# Patient Record
Sex: Female | Born: 1980 | Race: Black or African American | Hispanic: No | Marital: Married | Smoking: Never smoker
Health system: Southern US, Community
[De-identification: ages and names within clinical notes are randomized; demographics above are authoritative.]

## PROBLEM LIST (undated history)

## (undated) ENCOUNTER — Inpatient Hospital Stay (HOSPITAL_COMMUNITY): Payer: Self-pay

## (undated) DIAGNOSIS — Z800 Family history of malignant neoplasm of digestive organs: Secondary | ICD-10-CM

## (undated) DIAGNOSIS — O4400 Placenta previa specified as without hemorrhage, unspecified trimester: Secondary | ICD-10-CM

## (undated) DIAGNOSIS — C801 Malignant (primary) neoplasm, unspecified: Secondary | ICD-10-CM

## (undated) DIAGNOSIS — Z973 Presence of spectacles and contact lenses: Secondary | ICD-10-CM

## (undated) DIAGNOSIS — Z8042 Family history of malignant neoplasm of prostate: Secondary | ICD-10-CM

## (undated) DIAGNOSIS — Z923 Personal history of irradiation: Secondary | ICD-10-CM

## (undated) DIAGNOSIS — Z789 Other specified health status: Secondary | ICD-10-CM

## (undated) DIAGNOSIS — Z803 Family history of malignant neoplasm of breast: Secondary | ICD-10-CM

## (undated) DIAGNOSIS — Z801 Family history of malignant neoplasm of trachea, bronchus and lung: Secondary | ICD-10-CM

## (undated) DIAGNOSIS — Z9221 Personal history of antineoplastic chemotherapy: Secondary | ICD-10-CM

## (undated) HISTORY — PX: WISDOM TOOTH EXTRACTION: SHX21

## (undated) HISTORY — DX: Family history of malignant neoplasm of digestive organs: Z80.0

## (undated) HISTORY — PX: NO PAST SURGERIES: SHX2092

## (undated) HISTORY — DX: Family history of malignant neoplasm of trachea, bronchus and lung: Z80.1

## (undated) HISTORY — DX: Family history of malignant neoplasm of breast: Z80.3

## (undated) HISTORY — DX: Family history of malignant neoplasm of prostate: Z80.42

---

## 2001-01-07 ENCOUNTER — Other Ambulatory Visit: Admission: RE | Admit: 2001-01-07 | Discharge: 2001-01-07 | Payer: Self-pay | Admitting: Family Medicine

## 2001-10-07 ENCOUNTER — Other Ambulatory Visit: Admission: RE | Admit: 2001-10-07 | Discharge: 2001-10-07 | Payer: Self-pay | Admitting: Internal Medicine

## 2003-02-02 ENCOUNTER — Other Ambulatory Visit: Admission: RE | Admit: 2003-02-02 | Discharge: 2003-02-02 | Payer: Self-pay | Admitting: Family Medicine

## 2012-06-20 ENCOUNTER — Encounter: Payer: Self-pay | Admitting: Women's Health

## 2012-06-20 ENCOUNTER — Other Ambulatory Visit (HOSPITAL_COMMUNITY)
Admission: RE | Admit: 2012-06-20 | Discharge: 2012-06-20 | Disposition: A | Discharge status: Home / Self Care | Payer: BC Managed Care – PPO | Source: Ambulatory Visit | Attending: Women's Health | Admitting: Women's Health

## 2012-06-20 ENCOUNTER — Ambulatory Visit (INDEPENDENT_AMBULATORY_CARE_PROVIDER_SITE_OTHER): Payer: BC Managed Care – PPO | Admitting: Women's Health

## 2012-06-20 VITALS — BP 120/68 | Ht 64.25 in | Wt 126.0 lb

## 2012-06-20 DIAGNOSIS — Z01419 Encounter for gynecological examination (general) (routine) without abnormal findings: Secondary | ICD-10-CM

## 2012-06-20 DIAGNOSIS — Z1151 Encounter for screening for human papillomavirus (HPV): Secondary | ICD-10-CM | POA: Insufficient documentation

## 2012-06-20 DIAGNOSIS — N898 Other specified noninflammatory disorders of vagina: Secondary | ICD-10-CM

## 2012-06-20 DIAGNOSIS — IMO0001 Reserved for inherently not codable concepts without codable children: Secondary | ICD-10-CM

## 2012-06-20 DIAGNOSIS — Z309 Encounter for contraceptive management, unspecified: Secondary | ICD-10-CM

## 2012-06-20 LAB — CBC WITH DIFFERENTIAL/PLATELET
Eosinophils Absolute: 0.1 10*3/uL (ref 0.0–0.7)
Eosinophils Relative: 2 % (ref 0–5)
Lymphs Abs: 3 10*3/uL (ref 0.7–4.0)
MCH: 29.5 pg (ref 26.0–34.0)
MCV: 87.8 fL (ref 78.0–100.0)
Platelets: 290 10*3/uL (ref 150–400)
RDW: 13.5 % (ref 11.5–15.5)

## 2012-06-20 LAB — WET PREP FOR TRICH, YEAST, CLUE: Clue Cells Wet Prep HPF POC: NONE SEEN

## 2012-06-20 MED ORDER — DROSPIRENONE-ETHINYL ESTRADIOL 3-0.02 MG PO TABS: 1.0000 | ORAL_TABLET | Freq: Every day | ORAL | Status: DC

## 2012-06-20 NOTE — Patient Instructions (Signed)
tdap vaccine Health Maintenance, Females A healthy lifestyle and preventative care can promote health and wellness.  Maintain regular health, dental, and eye exams.   Eat a healthy diet. Foods like vegetables, fruits, whole grains, low-fat dairy products, and lean protein foods contain the nutrients you need without too many calories. Decrease your intake of foods high in solid fats, added sugars, and salt. Get information about a proper diet from your caregiver, if necessary.   Regular physical exercise is one of the most important things you can do for your health. Most adults should get at least 150 minutes of moderate-intensity exercise (any activity that increases your heart rate and causes you to sweat) each week. In addition, most adults need muscle-strengthening exercises on 2 or more days a week.    Maintain a healthy weight. The body mass index (BMI) is a screening tool to identify possible weight problems. It provides an estimate of body fat based on height and weight. Your caregiver can help determine your BMI, and can help you achieve or maintain a healthy weight. For adults 20 years and older:   A BMI below 18.5 is considered underweight.   A BMI of 18.5 to 24.9 is normal.   A BMI of 25 to 29.9 is considered overweight.   A BMI of 30 and above is considered obese.   Maintain normal blood lipids and cholesterol by exercising and minimizing your intake of saturated fat. Eat a balanced diet with plenty of fruits and vegetables. Blood tests for lipids and cholesterol should begin at age 20 and be repeated every 5 years. If your lipid or cholesterol levels are high, you are over 50, or you are a high risk for heart disease, you may need your cholesterol levels checked more frequently.Ongoing high lipid and cholesterol levels should be treated with medicines if diet and exercise are not effective.   If you smoke, find out from your caregiver how to quit. If you do not use tobacco, do  not start.   If you are pregnant, do not drink alcohol. If you are breastfeeding, be very cautious about drinking alcohol. If you are not pregnant and choose to drink alcohol, do not exceed 1 drink per day. One drink is considered to be 12 ounces (355 mL) of beer, 5 ounces (148 mL) of wine, or 1.5 ounces (44 mL) of liquor.   Avoid use of street drugs. Do not share needles with anyone. Ask for help if you need support or instructions about stopping the use of drugs.   High blood pressure causes heart disease and increases the risk of stroke. Blood pressure should be checked at least every 1 to 2 years. Ongoing high blood pressure should be treated with medicines, if weight loss and exercise are not effective.   If you are 55 to 32 years old, ask your caregiver if you should take aspirin to prevent strokes.   Diabetes screening involves taking a blood sample to check your fasting blood sugar level. This should be done once every 3 years, after age 45, if you are within normal weight and without risk factors for diabetes. Testing should be considered at a younger age or be carried out more frequently if you are overweight and have at least 1 risk factor for diabetes.   Breast cancer screening is essential preventative care for women. You should practice "breast self-awareness." This means understanding the normal appearance and feel of your breasts and may include breast self-examination. Any changes detected, no   matter how small, should be reported to a caregiver. Women in their 20s and 30s should have a clinical breast exam (CBE) by a caregiver as part of a regular health exam every 1 to 3 years. After age 40, women should have a CBE every year. Starting at age 40, women should consider having a mammogram (breast X-ray) every year. Women who have a family history of breast cancer should talk to their caregiver about genetic screening. Women at a high risk of breast cancer should talk to their caregiver  about having an MRI and a mammogram every year.   The Pap test is a screening test for cervical cancer. Women should have a Pap test starting at age 21. Between ages 21 and 29, Pap tests should be repeated every 2 years. Beginning at age 30, you should have a Pap test every 3 years as long as the past 3 Pap tests have been normal. If you had a hysterectomy for a problem that was not cancer or a condition that could lead to cancer, then you no longer need Pap tests. If you are between ages 65 and 70, and you have had normal Pap tests going back 10 years, you no longer need Pap tests. If you have had past treatment for cervical cancer or a condition that could lead to cancer, you need Pap tests and screening for cancer for at least 20 years after your treatment. If Pap tests have been discontinued, risk factors (such as a new sexual partner) need to be reassessed to determine if screening should be resumed. Some women have medical problems that increase the chance of getting cervical cancer. In these cases, your caregiver may recommend more frequent screening and Pap tests.   The human papillomavirus (HPV) test is an additional test that may be used for cervical cancer screening. The HPV test looks for the virus that can cause the cell changes on the cervix. The cells collected during the Pap test can be tested for HPV. The HPV test could be used to screen women aged 30 years and older, and should be used in women of any age who have unclear Pap test results. After the age of 30, women should have HPV testing at the same frequency as a Pap test.   Colorectal cancer can be detected and often prevented. Most routine colorectal cancer screening begins at the age of 50 and continues through age 75. However, your caregiver may recommend screening at an earlier age if you have risk factors for colon cancer. On a yearly basis, your caregiver may provide home test kits to check for hidden blood in the stool. Use of a  small camera at the end of a tube, to directly examine the colon (sigmoidoscopy or colonoscopy), can detect the earliest forms of colorectal cancer. Talk to your caregiver about this at age 50, when routine screening begins. Direct examination of the colon should be repeated every 5 to 10 years through age 75, unless early forms of pre-cancerous polyps or small growths are found.   Hepatitis C blood testing is recommended for all people born from 1945 through 1965 and any individual with known risks for hepatitis C.   Practice safe sex. Use condoms and avoid high-risk sexual practices to reduce the spread of sexually transmitted infections (STIs). Sexually active women aged 25 and younger should be checked for Chlamydia, which is a common sexually transmitted infection. Older women with new or multiple partners should also be tested for Chlamydia. Testing   for other STIs is recommended if you are sexually active and at increased risk.   Osteoporosis is a disease in which the bones lose minerals and strength with aging. This can result in serious bone fractures. The risk of osteoporosis can be identified using a bone density scan. Women ages 65 and over and women at risk for fractures or osteoporosis should discuss screening with their caregivers. Ask your caregiver whether you should be taking a calcium supplement or vitamin D to reduce the rate of osteoporosis.   Menopause can be associated with physical symptoms and risks. Hormone replacement therapy is available to decrease symptoms and risks. You should talk to your caregiver about whether hormone replacement therapy is right for you.   Use sunscreen with a sun protection factor (SPF) of 30 or greater. Apply sunscreen liberally and repeatedly throughout the day. You should seek shade when your shadow is shorter than you. Protect yourself by wearing long sleeves, pants, a wide-brimmed hat, and sunglasses year round, whenever you are outdoors.   Notify  your caregiver of new moles or changes in moles, especially if there is a change in shape or color. Also notify your caregiver if a mole is larger than the size of a pencil eraser.   Stay current with your immunizations.  Document Released: 06/05/2011 Document Revised: 11/09/2011 Document Reviewed: 06/05/2011 [employer] Patient Information 2012 [employer]. 

## 2012-06-20 NOTE — Progress Notes (Signed)
Chelsea Wise [birth year] [id number]    History:    The patient presents for annual exam.  Monthly 4-5 day cycle on Yaz. History of normal Paps per patient, normal Pap 5/11. Desiring children in a year or so.   Past medical history, past surgical history, family history and social history were all reviewed and documented in the EPIC chart. Works for the [employer].   ROS:  A  ROS was performed and pertinent positives and negatives are included in the history.  Exam:  Filed Vitals:   06/20/12 1455  BP: 120/68    General appearance:  Normal Head/Neck:  Normal, without cervical or supraclavicular adenopathy. Thyroid:  Symmetrical, normal in size, without palpable masses or nodularity. Respiratory  Effort:  Normal  Auscultation:  Clear without wheezing or rhonchi Cardiovascular  Auscultation:  Regular rate, without rubs, murmurs or gallops  Edema/varicosities:  Not grossly evident Abdominal  Soft,nontender, without masses, guarding or rebound.  Liver/spleen:  No organomegaly noted  Hernia:  None appreciated  Skin  Inspection:  Grossly normal  Palpation:  Grossly normal Neurologic/psychiatric  Orientation:  Normal with appropriate conversation.  Mood/affect:  Normal  Genitourinary    Breasts: Examined lying and sitting.     Right: Without masses, retractions, discharge or axillary adenopathy.     Left: Without masses, retractions, discharge or axillary adenopathy.   Inguinal/mons:  Normal without inguinal adenopathy  External genitalia:  Normal  BUS/Urethra/Skene's glands:  Normal  Bladder:  Normal  Vagina:  Normal wet prep negative  Cervix:  Normal  Uterus:   normal in size, shape and contour.  Midline and mobile  Adnexa/parametria:     Rt: Without masses or tenderness.   Lt: Without masses or tenderness.  Anus and perineum: Normal  Digital rectal exam: Normal sphincter tone without palpated masses or tenderness  Assessment/Plan:  31 y.o. MBF G0 for annual  exam with complaint of vaginal irritation.  Normal GYN exam on Yaz  Plan: Yaz prescription, proper use, slight increased risk for blood clots and strokes reviewed and accepted. SBE's, exercise, calcium rich diet, MVI daily encouraged. CBC, rubella, UA Pap with HR HPV typing. New Pap screening recommendations reviewed. Reviewed normality of wet prep.    [facility], 5:37 PM 06/20/2012

## 2012-06-21 LAB — URINALYSIS W MICROSCOPIC + REFLEX CULTURE
Bacteria, UA: NONE SEEN
Casts: NONE SEEN
Hgb urine dipstick: NEGATIVE
Ketones, ur: NEGATIVE mg/dL
Nitrite: NEGATIVE
pH: 7 (ref 5.0–8.0)

## 2012-06-21 LAB — RUBELLA SCREEN: Rubella: 205.5 IU/mL — ABNORMAL HIGH

## 2013-03-20 ENCOUNTER — Encounter (HOSPITAL_COMMUNITY): Payer: Self-pay | Admitting: Emergency Medicine

## 2013-03-20 ENCOUNTER — Emergency Department (HOSPITAL_COMMUNITY): Payer: Self-pay

## 2013-03-20 ENCOUNTER — Emergency Department (HOSPITAL_COMMUNITY)
Admission: EM | Admit: 2013-03-20 | Discharge: 2013-03-20 | Disposition: A | Discharge status: Home / Self Care | Payer: Self-pay | Attending: Emergency Medicine | Admitting: Emergency Medicine

## 2013-03-20 ENCOUNTER — Inpatient Hospital Stay (HOSPITAL_COMMUNITY)
Admission: AD | Admit: 2013-03-20 | Discharge: 2013-03-20 | Disposition: A | Discharge status: Home / Self Care | Payer: Self-pay | Source: Ambulatory Visit | Attending: Obstetrics and Gynecology | Admitting: Obstetrics and Gynecology

## 2013-03-20 ENCOUNTER — Encounter (HOSPITAL_COMMUNITY): Payer: Self-pay | Admitting: *Deleted

## 2013-03-20 ENCOUNTER — Other Ambulatory Visit: Payer: Self-pay

## 2013-03-20 DIAGNOSIS — R109 Unspecified abdominal pain: Secondary | ICD-10-CM | POA: Insufficient documentation

## 2013-03-20 DIAGNOSIS — O9989 Other specified diseases and conditions complicating pregnancy, childbirth and the puerperium: Secondary | ICD-10-CM | POA: Insufficient documentation

## 2013-03-20 DIAGNOSIS — O209 Hemorrhage in early pregnancy, unspecified: Secondary | ICD-10-CM | POA: Insufficient documentation

## 2013-03-20 DIAGNOSIS — O200 Threatened abortion: Secondary | ICD-10-CM | POA: Insufficient documentation

## 2013-03-20 LAB — BASIC METABOLIC PANEL
Calcium: 9.1 mg/dL (ref 8.4–10.5)
GFR calc Af Amer: >90 mL/min (ref >90)
GFR calc non Af Amer: >90 mL/min (ref >90)
Potassium: 3.8 mEq/L (ref 3.5–5.1)
Sodium: 136 mEq/L (ref 135–145)

## 2013-03-20 LAB — CBC WITH DIFFERENTIAL/PLATELET
Basophils Absolute: 0 10*3/uL (ref 0.0–0.1)
Basophils Relative: 0 % (ref 0–1)
Eosinophils Absolute: 0.1 10*3/uL (ref 0.0–0.7)
MCH: 30.8 pg (ref 26.0–34.0)
MCHC: 34.8 g/dL (ref 30.0–36.0)
Neutrophils Relative %: 63 % (ref 43–77)
Platelets: 238 10*3/uL (ref 150–400)
RBC: 4.54 MIL/uL (ref 3.87–5.11)
RDW: 12.9 % (ref 11.5–15.5)

## 2013-03-20 MED ORDER — KETOROLAC TROMETHAMINE 60 MG/2ML IM SOLN
60.0000 mg | Freq: Once | INTRAMUSCULAR | Status: AC
  Administered 2013-03-20: 60 mg via INTRAMUSCULAR
  Filled 2013-03-20: qty 2

## 2013-03-20 NOTE — MAU Provider Note (Signed)
History     CSN: [id number]  Arrival date and time: 03/20/13 1409   None     Chief Complaint  Patient presents with  . Vaginal Bleeding  . Threatened Miscarriage   HPI Chelsea Wise is 32 y.o. G1P0 Unknown weeks presenting with report of brown discharge yesterday, blood clot this am and then at 1pm she had a sudden gush of bright red bleeding/clots.  Patient of Dr. Emeline Darling.  She was seen at 6am at [facility].  The ED called office and she went there to scheduled ultrasound.  Was given appt for 4/25.  Then at 1 had the sudden gust of blood.  She is intermittent cramping--rating 10/10 when it comes.   Took tylenol today at noon.      History reviewed. No pertinent past medical history.  History reviewed. No pertinent past surgical history.  Family History  Problem Relation Age of Onset  . Hypertension Father   . Diabetes Maternal Aunt   . Hypertension Maternal Aunt   . Diabetes Maternal Uncle   . Hypertension Maternal Uncle   . Cancer Paternal Aunt     lung  . Cancer Paternal Grandmother     pancreas  . Stroke Paternal Grandfather     History  Substance Use Topics  . Smoking status: Never Smoker   . Smokeless tobacco: Never Used  . Alcohol Use: Yes     Comment: social    Allergies: No Known Allergies  Prescriptions prior to admission  Medication Sig Dispense Refill  . acetaminophen (TYLENOL) 500 MG tablet Take 500 mg by mouth every 6 (six) hours as needed for pain (stomach pain).       Marland Kitchen diphenhydrAMINE (SOMINEX) 25 MG tablet Take 25 mg by mouth at bedtime as needed for sleep.      . Prenatal Vit-Fe Fumarate-FA (PRENATAL MULTIVITAMIN) TABS Take 1 tablet by mouth every evening.        Review of Systems  Constitutional: Negative for fever.  Gastrointestinal: Positive for abdominal pain. Negative for nausea and vomiting.  Genitourinary:       Heavy vaginal bleeding with clots   Physical Exam   Blood pressure 114/66, pulse 85, temperature 98.2 F (36.8 C),  temperature source Oral, resp. rate 18, last menstrual period 01/05/2013.  Physical Exam  Constitutional: She is oriented to person, place, and time. She appears well-developed and well-nourished. No distress.  HENT:  Head: Normocephalic.  Neck: Normal range of motion.  Cardiovascular: Normal rate.   Respiratory: Effort normal.  GI: Soft. She exhibits no distension and no mass. There is no tenderness. There is no rebound and no guarding.  Genitourinary: There is no rash, tenderness or lesion on the right labia. There is no rash, tenderness or lesion on the left labia. Uterus is enlarged (6-7 week size) and tender (moderate). Right adnexum displays no mass, no tenderness and no fullness. Left adnexum displays no mass, no tenderness and no fullness. There is tenderness and bleeding around the vagina. Foreign body: moderate amount of bright red blood with clot and a small amount of tissue.  GS not seen.  Cervix is slightly open.    Neurological: She is alert and oriented to person, place, and time.  Skin: Skin is warm and dry.  Psychiatric: She has a normal mood and affect. Her behavior is normal.     LABS and ULTRASOUND RESULTS FROM [facility]-ED VISIT THIS AM:  Results for orders placed during the hospital encounter of 03/20/13 (from the past 24  hour(s))  HCG, QUANTITATIVE, PREGNANCY     Status: Abnormal   Collection Time    03/20/13  7:00 AM      Result Value Range   hCG, Beta Chain, Quant, S 9514 (*) <5 mIU/mL  CBC WITH DIFFERENTIAL     Status: None   Collection Time    03/20/13  7:00 AM      Result Value Range   WBC 6.7  4.0 - 10.5 K/uL   RBC 4.54  3.87 - 5.11 MIL/uL   Hemoglobin 14.0  12.0 - 15.0 g/dL   HCT 47.8  29.5 - 62.1 %   MCV 88.5  78.0 - 100.0 fL   MCH 30.8  26.0 - 34.0 pg   MCHC 34.8  30.0 - 36.0 g/dL   RDW 30.8  65.7 - 84.6 %   Platelets 238  150 - 400 K/uL   Neutrophils Relative 63  43 - 77 %   Neutro Abs 4.2  1.7 - 7.7 K/uL   Lymphocytes Relative 30  12 - 46 %    Lymphs Abs 2.0  0.7 - 4.0 K/uL   Monocytes Relative 5  3 - 12 %   Monocytes Absolute 0.4  0.1 - 1.0 K/uL   Eosinophils Relative 2  0 - 5 %   Eosinophils Absolute 0.1  0.0 - 0.7 K/uL   Basophils Relative 0  0 - 1 %   Basophils Absolute 0.0  0.0 - 0.1 K/uL  BASIC METABOLIC PANEL     Status: None   Collection Time    03/20/13  7:00 AM      Result Value Range   Sodium 136  135 - 145 mEq/L   Potassium 3.8  3.5 - 5.1 mEq/L   Chloride 102  96 - 112 mEq/L   CO2 25  19 - 32 mEq/L   Glucose, Bld 84  70 - 99 mg/dL   BUN 7  6 - 23 mg/dL   Creatinine, Ser 9.62  0.50 - 1.10 mg/dL   Calcium 9.1  8.4 - 95.2 mg/dL   GFR calc non Af Amer >90  >90 mL/min   GFR calc Af Amer >90  >90 mL/min  ABO/RH     Status: None   Collection Time    03/20/13  7:00 AM      Result Value Range   ABO/RH(D) O POS    Clinical Data: Early pregnancy with vaginal bleeding. Expected  estimated gestational age by LMP of 10 weeks 4 days.  OBSTETRIC <14 WK [place] AND TRANSVAGINAL OB US  Technique: Both transabdominal and transvaginal ultrasound  examinations were performed for complete evaluation of the  gestation as well as the maternal uterus, adnexal regions, and  pelvic cul-de-sac. Transvaginal technique was performed to assess  early pregnancy.  Comparison: None.  Intrauterine gestational sac: Visualized/normal in shape.  Yolk sac: Probable  Embryo: Question early  Cardiac Activity: Not seen  Heart Rate: Not applicable bpm  MSD: 20.6 mm 7 w 0 d  CRL: 2.3 mm 5 w 5 d [place] EDC: 11/15/2013  Maternal uterus/adnexae:  Both ovaries are seen and have a normal appearance with the right  ovary measuring 2.9 x 1.0 x 1.0 cm and the left ovary measuring 3.0  x 2.3 x 2.6 cm and containing a corpus luteum. A small amount of  simple free fluid is noted in the cul-de-sac.  IMPRESSION:  Very irregular gestational sac with poor chorionic reaction. An  amnion is seen with probable yolk  sac and questionable early fetal  pole. No  cardiac activity is seen but would not necessarily be  expected at today's crown-rump length of 2.3 mm. Overall  appearance of the gestational sac is worrisome for a nonprogressing  gestation but follow-up evaluation due to questionable viability is  recommended in 1 week to assess for appropriate interval growth and  expected development of cardiac activity.  Normal ovaries   RE-EXAMINATION of the patient at time of discharge-showed very little blood in the vaginal.  1 small clot  Patient is much more comfortable after the Toradol.  Describes now as soreness. MAU Course  Procedures MDM 15:18  Reported MSE to Dr. Ambrose Mantle.  Order given for pelvic exam, Toradol for pain and follow up in the office in 1 week.  Toradol 60mg  Im given Tissue sent to pathology     Assessment and Plan  A:  Vaginal bleeding in first trimester pregnancy with U/S findings worrisome for nonprogressing gestation       Inevitable miscarriage  P:  Follow up with Dr. Ellyn Hack in 1 week.      Call MD if sxs worsen     May take tylenol tonight for discomfort and begin with Ibuprofen tomorrow.     Discussed expected management.  KEY,EVE M 03/20/2013, 3:05 PM

## 2013-03-20 NOTE — ED Provider Notes (Signed)
Medical screening examination/treatment/procedure(s) were conducted as a shared visit with non-physician practitioner(s) and myself.  I personally evaluated the patient during the encounter Pt with vaginal bleeding and positive pregnancy.  Has not had U/S yet to eval for IUP.  Pt's HCG 9000 and O+ blood.  U/S concerning for possible fetal demise but no signs of ectopic.  Spoke with gyn who will f/u with pt and repeat imaging.  Gwyneth Sprout, MD 03/20/13 1444

## 2013-03-20 NOTE — ED Notes (Signed)
Pt to US.

## 2013-03-20 NOTE — ED Notes (Signed)
Pt alert and oriented x4. Respirations even and unlabored, bilateral symmetrical rise and fall of chest. Skin warm and dry. In no acute distress. Denies needs.   

## 2013-03-20 NOTE — ED Notes (Signed)
[place] has been called. Waiting for [place] to arrive

## 2013-03-20 NOTE — ED Notes (Signed)
Pt presents to ED with c/o vaginal bleeding with abdominal pain, reports that she is 10-week pregnant; pt reports that she woke up at around 5:20AM today and used bathroom--- she noted vaginal bleeding, states "bright red blood clots"; pt states that she got sick in the stomach the day before yesterday; pt visited her OB-Gyne yesterday.

## 2013-03-20 NOTE — MAU Note (Signed)
Measuring only 5 wks - with just a sac on [place] today, was told to f/u with dr, they can't get her in until the 25th

## 2013-03-20 NOTE — ED Notes (Signed)
PA at bedside.

## 2013-03-20 NOTE — ED Provider Notes (Signed)
History     CSN: [id number]  Arrival date & time 03/20/13  4098   First MD Initiated Contact with Patient 03/20/13 [id number]      Chief Complaint  Patient presents with  . Vaginal Bleeding    [redacted] weeks pregnant    (Consider location/radiation/quality/duration/timing/severity/associated sxs/prior treatment) HPI Comments: Patient presents to the ED with a chief complaint of vaginal bleeding.  She states that she is about [redacted] weeks pregnant.  She was seen by her OBGYN, Dr. Ellyn Hack, yesterday.   She was told that she had some dark blood, but was told that it was old, and she didn't need to be concerned. Today, she says that she had some bright red blood clots.  She also complains of some vague abdominal pain, which she says feels like a "gas bubble."  This is her first pregnancy.  The history is provided by the patient. No language interpreter was used.    History reviewed. No pertinent past medical history.  History reviewed. No pertinent past surgical history.  Family History  Problem Relation Age of Onset  . Hypertension Father   . Diabetes Maternal Aunt   . Hypertension Maternal Aunt   . Diabetes Maternal Uncle   . Hypertension Maternal Uncle   . Cancer Paternal Aunt     lung  . Cancer Paternal Grandmother     pancreas  . Stroke Paternal Grandfather     History  Substance Use Topics  . Smoking status: Never Smoker   . Smokeless tobacco: Never Used  . Alcohol Use: Yes     Comment: social    OB History   Grav Para Term Preterm Abortions TAB SAB Ect Mult Living   0               Review of Systems  All other systems reviewed and are negative.    Allergies  Review of patient's allergies indicates no known allergies.  Home Medications   Current Outpatient Rx  Name  Route  Sig  Dispense  Refill  . acetaminophen (TYLENOL) 500 MG tablet   Oral   Take 500 mg by mouth every 6 (six) hours as needed for pain.         . Prenatal Vit-Fe Fumarate-FA (PRENATAL  MULTIVITAMIN) TABS   Oral   Take 1 tablet by mouth every evening.           BP 149/75  Pulse 87  Temp(Src) 98.7 F (37.1 C) (Oral)  Resp 18  Ht 5\' 4"  (1.626 m)  Wt 125 lb (56.7 kg)  BMI 21.45 kg/m2  SpO2 100%  Physical Exam  Nursing note and vitals reviewed. Constitutional: She is oriented to person, place, and time. She appears well-developed and well-nourished.  HENT:  Head: Normocephalic and atraumatic.  Eyes: Conjunctivae and EOM are normal. Pupils are equal, round, and reactive to light.  Neck: Normal range of motion. Neck supple.  Cardiovascular: Normal rate and regular rhythm.  Exam reveals no gallop and no friction rub.   No murmur heard. Pulmonary/Chest: Effort normal and breath sounds normal. No respiratory distress. She has no wheezes. She has no rales. She exhibits no tenderness.  Abdominal: Soft. Bowel sounds are normal. She exhibits no distension and no mass. There is no tenderness. There is no rebound and no guarding.  Genitourinary: No labial fusion. There is no rash, tenderness, lesion or injury on the right labia. There is no rash, tenderness, lesion or injury on the left labia. Uterus is not deviated,  not enlarged, not fixed and not tender. Cervix exhibits no motion tenderness, no discharge and no friability. Right adnexum displays no mass, no tenderness and no fullness. Left adnexum displays no mass, no tenderness and no fullness. There is bleeding around the vagina. No erythema or tenderness around the vagina. No foreign body around the vagina. No signs of injury around the vagina. No vaginal discharge found.  Dark red blood with clots in the vagina, no active hemorrhage.  OS feels closed.  Musculoskeletal: Normal range of motion. She exhibits no edema and no tenderness.  Neurological: She is alert and oriented to person, place, and time.  Skin: Skin is warm and dry.  Psychiatric: She has a normal mood and affect. Her behavior is normal. Judgment and thought  content normal.    ED Course  Procedures (including critical care time)  Labs Reviewed  HCG, QUANTITATIVE, PREGNANCY  CBC WITH DIFFERENTIAL  BASIC METABOLIC PANEL  ABO/RH   Results for orders placed during the hospital encounter of 03/20/13  HCG, QUANTITATIVE, PREGNANCY      Result Value Range   hCG, Beta Chain, Quant, S 9514 (*) <5 mIU/mL  CBC WITH DIFFERENTIAL      Result Value Range   WBC 6.7  4.0 - 10.5 K/uL   RBC 4.54  3.87 - 5.11 MIL/uL   Hemoglobin 14.0  12.0 - 15.0 g/dL   HCT 16.1  09.6 - 04.5 %   MCV 88.5  78.0 - 100.0 fL   MCH 30.8  26.0 - 34.0 pg   MCHC 34.8  30.0 - 36.0 g/dL   RDW 40.9  81.1 - 91.4 %   Platelets 238  150 - 400 K/uL   Neutrophils Relative 63  43 - 77 %   Neutro Abs 4.2  1.7 - 7.7 K/uL   Lymphocytes Relative 30  12 - 46 %   Lymphs Abs 2.0  0.7 - 4.0 K/uL   Monocytes Relative 5  3 - 12 %   Monocytes Absolute 0.4  0.1 - 1.0 K/uL   Eosinophils Relative 2  0 - 5 %   Eosinophils Absolute 0.1  0.0 - 0.7 K/uL   Basophils Relative 0  0 - 1 %   Basophils Absolute 0.0  0.0 - 0.1 K/uL  BASIC METABOLIC PANEL      Result Value Range   Sodium 136  135 - 145 mEq/L   Potassium 3.8  3.5 - 5.1 mEq/L   Chloride 102  96 - 112 mEq/L   CO2 25  19 - 32 mEq/L   Glucose, Bld 84  70 - 99 mg/dL   BUN 7  6 - 23 mg/dL   Creatinine, Ser 7.82  0.50 - 1.10 mg/dL   Calcium 9.1  8.4 - 95.6 mg/dL   GFR calc non Af Amer >90  >90 mL/min   GFR calc Af Amer >90  >90 mL/min  ABO/RH      Result Value Range   ABO/RH(D) O POS     US Ob Comp Less 14 Wks  03/20/2013  *RADIOLOGY REPORT*  Clinical Data: Early pregnancy with vaginal bleeding.  Expected estimated gestational age by LMP of 10 weeks 4 days.  OBSTETRIC <14 WK [place] AND TRANSVAGINAL OB US  Technique:  Both transabdominal and transvaginal ultrasound examinations were performed for complete evaluation of the gestation as well as the maternal uterus, adnexal regions, and pelvic cul-de-sac.  Transvaginal technique was performed  to assess early pregnancy.  Comparison:  None.  Intrauterine  gestational sac:  Visualized/normal in shape. Yolk sac: Probable Embryo: Question early Cardiac Activity: Not seen Heart Rate: Not applicable bpm  MSD: 20.6 mm  7 w 0 d CRL: 2.3  mm  5 w  5 d        [place] EDC: 11/15/2013  Maternal uterus/adnexae: Both ovaries are seen and have a normal appearance with the right ovary measuring 2.9 x 1.0 x 1.0 cm and the left ovary measuring 3.0 x 2.3 x 2.6 cm and containing a corpus luteum.  A small amount of simple free fluid is noted in the cul-de-sac.  IMPRESSION: Very irregular gestational sac with poor chorionic reaction.  An amnion is seen with probable yolk sac and questionable early fetal pole.  No cardiac activity is seen but would not necessarily be expected at today's crown-rump length of 2.3 mm.  Overall appearance of the gestational sac is worrisome for a nonprogressing gestation but follow-up evaluation due to questionable viability is recommended in 1 week to assess for appropriate interval growth and expected development of cardiac activity.  Normal ovaries   Original Report Authenticated By: Rhodia Albright, M.D.    US Transvaginal Non-ob  03/20/2013  *RADIOLOGY REPORT*  Clinical Data: Early pregnancy with vaginal bleeding.  Expected estimated gestational age by LMP of 10 weeks 4 days.  OBSTETRIC <14 WK [place] AND TRANSVAGINAL OB US  Technique:  Both transabdominal and transvaginal ultrasound examinations were performed for complete evaluation of the gestation as well as the maternal uterus, adnexal regions, and pelvic cul-de-sac.  Transvaginal technique was performed to assess early pregnancy.  Comparison:  None.  Intrauterine gestational sac:  Visualized/normal in shape. Yolk sac: Probable Embryo: Question early Cardiac Activity: Not seen Heart Rate: Not applicable bpm  MSD: 20.6 mm  7 w 0 d CRL: 2.3  mm  5 w  5 d        [place] EDC: 11/15/2013  Maternal uterus/adnexae: Both ovaries are seen and have a normal  appearance with the right ovary measuring 2.9 x 1.0 x 1.0 cm and the left ovary measuring 3.0 x 2.3 x 2.6 cm and containing a corpus luteum.  A small amount of simple free fluid is noted in the cul-de-sac.  IMPRESSION: Very irregular gestational sac with poor chorionic reaction.  An amnion is seen with probable yolk sac and questionable early fetal pole.  No cardiac activity is seen but would not necessarily be expected at today's crown-rump length of 2.3 mm.  Overall appearance of the gestational sac is worrisome for a nonprogressing gestation but follow-up evaluation due to questionable viability is recommended in 1 week to assess for appropriate interval growth and expected development of cardiac activity.  Normal ovaries   Original Report Authenticated By: Rhodia Albright, M.D.       1. Vaginal bleeding in pregnancy, first trimester       MDM  Patient who is [redacted] weeks pregnant, who has vaginal bleeding.  There is no active hemorrhage, but there is dark red blood with clots in the vagina.  Concern for threatened abortion.  Bedside US performed with Dr. Anitra Lauth, no visualization of embryo.  Will order formal transvaginal US for further evaluation.  7:35 AM Patient seen by and discussed with Dr. Anitra Lauth.  [place] is concerning, and recommend close follow up.  I discussed the patient with Dr. Ellyn Hack from OBGYN who will schedule a follow-up appointment.  Return precautions have been given.  Patient is stable and ready for discharge.  Roxy Horseman, PA-C 03/20/13 1148

## 2013-03-20 NOTE — MAU Note (Addendum)
Was at ER this morning - was clotting.was told everything was ok.  Went to work, started bleeding heavy, bright red blood,  And cramping. Had brown spotting yesterday.

## 2013-03-20 NOTE — ED Notes (Signed)
Pt back from US

## 2013-03-23 ENCOUNTER — Inpatient Hospital Stay (HOSPITAL_COMMUNITY)
Admission: AD | Admit: 2013-03-23 | Discharge: 2013-03-23 | Disposition: A | Discharge status: Home / Self Care | Payer: Self-pay | Source: Ambulatory Visit | Attending: Obstetrics and Gynecology | Admitting: Obstetrics and Gynecology

## 2013-03-23 DIAGNOSIS — Z331 Pregnant state, incidental: Secondary | ICD-10-CM

## 2013-03-23 DIAGNOSIS — O021 Missed abortion: Secondary | ICD-10-CM | POA: Insufficient documentation

## 2013-03-23 DIAGNOSIS — Z711 Person with feared health complaint in whom no diagnosis is made: Secondary | ICD-10-CM

## 2013-03-23 LAB — HCG, QUANTITATIVE, PREGNANCY: hCG, Beta Chain, Quant, S: 1722 m[IU]/mL — ABNORMAL HIGH (ref <5)

## 2013-03-23 NOTE — MAU Provider Note (Signed)
Chelsea Wise 32 y.o. SUBJECTIVE: G1P0 at [redacted]w[redacted]d by LMP presents unscheduled for F/U of visit 03/20/13 when she was seen here for early pregnancy bright red bleeding and cramping. [place] that day showed indeterminate viability with irregular GS and questionable fetal pole c/w [redacted]w[redacted]d; adnexae nl. Quant 9514; O pos; hgb 14. Has F/U US and visit scheduled with Chelsea Wise 03/28/13. Yesterday passed small clots and what looked like tissue. Now having light bleeding and mild  cramping.   OBJECTIVE: Filed Vitals:   03/23/13 0905  BP: 123/70  Pulse: 51  Temp: 97.8 F (36.6 C)  Resp: 18   Gen: NAD Abd: soft, NT Results for orders placed during the hospital encounter of 03/23/13 (from the past 24 hour(s))  HCG, QUANTITATIVE, PREGNANCY     Status: Abnormal   Collection Time    03/23/13  8:55 AM      Result Value Range   hCG, Beta Chain, Quant, S 1722 (*) <5 mIU/mL   ASSESSMENT: Early pregnancy failure  PLAN: D/W Dr. Senaida Ores: Keep F/U appt as scheduled 03/28/13. Bleeding precautions and pt ed re EPF.

## 2013-03-23 NOTE — MAU Note (Signed)
Pt is here today with complaints of "wanting to make sure everything is out of her uterus". She was here 2 days ago; miscarriage. She was told to follow up with her Dr. In 1 week. Pt did not remember what they told her to do for follow up bc she was so upset. She has an appt scheduled on the 25th with Dr. Beaulah Dinning office. She thought she was suppose to come here for [place]. Pt agreed that she will keep her appt on the 25th. Her bleeding is stable; has decreased today. She is not having any pain;Pain is 0/10

## 2013-06-23 ENCOUNTER — Encounter: Payer: Self-pay | Admitting: Women's Health

## 2013-07-16 LAB — OB RESULTS CONSOLE HGB/HCT, BLOOD
HEMATOCRIT: 38 %
Hemoglobin: 13.2 g/dL

## 2013-07-16 LAB — OB RESULTS CONSOLE RPR: RPR: NONREACTIVE

## 2013-07-16 LAB — OB RESULTS CONSOLE GC/CHLAMYDIA
Chlamydia: NEGATIVE
GC PROBE AMP, GENITAL: NEGATIVE

## 2013-07-16 LAB — OB RESULTS CONSOLE HEPATITIS B SURFACE ANTIGEN: HEP B S AG: NEGATIVE

## 2013-07-18 LAB — OB RESULTS CONSOLE HIV ANTIBODY (ROUTINE TESTING): HIV: NONREACTIVE

## 2013-09-10 ENCOUNTER — Inpatient Hospital Stay (HOSPITAL_COMMUNITY)
Admission: AD | Admit: 2013-09-10 | Discharge: 2013-09-10 | Disposition: A | Discharge status: Home / Self Care | Payer: BC Managed Care – PPO | Source: Ambulatory Visit | Attending: Obstetrics and Gynecology | Admitting: Obstetrics and Gynecology

## 2013-09-10 ENCOUNTER — Inpatient Hospital Stay (HOSPITAL_COMMUNITY): Payer: BC Managed Care – PPO

## 2013-09-10 ENCOUNTER — Encounter (HOSPITAL_COMMUNITY): Payer: Self-pay | Admitting: *Deleted

## 2013-09-10 DIAGNOSIS — O4400 Placenta previa specified as without hemorrhage, unspecified trimester: Secondary | ICD-10-CM

## 2013-09-10 DIAGNOSIS — O4410 Placenta previa with hemorrhage, unspecified trimester: Secondary | ICD-10-CM | POA: Insufficient documentation

## 2013-09-10 HISTORY — DX: Other specified health status: Z78.9

## 2013-09-10 LAB — URINALYSIS, ROUTINE W REFLEX MICROSCOPIC
Bilirubin Urine: NEGATIVE
Ketones, ur: NEGATIVE mg/dL
Leukocytes, UA: NEGATIVE
Nitrite: NEGATIVE
Protein, ur: NEGATIVE mg/dL

## 2013-09-10 LAB — WET PREP, GENITAL: Yeast Wet Prep HPF POC: NONE SEEN

## 2013-09-10 NOTE — MAU Note (Signed)
Pt having bright red bleeding on tissue after having a bowel movement this morning.  Pt reports bilat groin pain, which is not a new pain.

## 2013-09-10 NOTE — MAU Provider Note (Signed)
Chief Complaint: Vaginal Bleeding   First Provider Initiated Contact with Patient 09/10/13 0710      SUBJECTIVE HPI: Chelsea Wise is a 32 y.o. G2P0010 at [redacted]w[redacted]d by LMP who presents with seeing a small amount of bleeding when she wiped after a BM this morning and ongoing mild bilat groin pain. Not sure if it was vaginal or rectal bleeding. None since. Has not had anatomy scan. Has not been informed that she had a previa. One previous episode of spotting early pregnancy. Doesn't remember what the Dx was.   Past Medical History  Diagnosis Date  . Medical history non-contributory    OB History  Gravida Para Term Preterm AB SAB TAB Ectopic Multiple Living  2    1 1         # Outcome Date GA Lbr Len/2nd Weight Sex Delivery Anes PTL Lv  2 CUR           1 SAB              Past Surgical History  Procedure Laterality Date  . No past surgeries     History   Social History  . Marital Status: Married    Spouse Name: N/A    Number of Children: N/A  . Years of Education: N/A   Occupational History  . Not on file.   Social History Main Topics  . Smoking status: Never Smoker   . Smokeless tobacco: Never Used  . Alcohol Use: No     Comment: social  . Drug Use: No  . Sexual Activity: Yes    Partners: Male   Other Topics Concern  . Not on file   Social History Narrative  . No narrative on file   No current facility-administered medications on file prior to encounter.   Current Outpatient Prescriptions on File Prior to Encounter  Medication Sig Dispense Refill  . acetaminophen (TYLENOL) 500 MG tablet Take 500 mg by mouth every 6 (six) hours as needed for pain (stomach pain).       . Prenatal Vit-Fe Fumarate-FA (PRENATAL MULTIVITAMIN) TABS Take 1 tablet by mouth every evening.       No Known Allergies  ROS: Pertinent items in HPI  OBJECTIVE Blood pressure 121/70, pulse 82, temperature 98.4 F (36.9 C), temperature source Oral, resp. rate 16, height 5\' 4"  (1.626 m),  weight 67.767 kg (149 lb 6.4 oz), last menstrual period 04/25/2013, unknown if currently breastfeeding. GENERAL: Well-developed, well-nourished female in no acute distress.  HEENT: Normocephalic HEART: normal rate RESP: normal effort ABDOMEN: Soft, non-tender EXTREMITIES: Nontender, no edema NEURO: Alert and oriented SPECULUM EXAM: NEFG, small amount of creamy, pink-tinged discharge noted, normal odor, cervix slightly friable, visually closed. BIMANUAL: Deferred due to unknown placentation  FHR 146 by doppler.  LAB RESULTS Results for orders placed during the hospital encounter of 09/10/13 (from the past 24 hour(s))  URINALYSIS, ROUTINE W REFLEX MICROSCOPIC     Status: Abnormal   Collection Time    09/10/13  6:30 AM      Result Value Range   Color, Urine YELLOW  YELLOW   APPearance CLEAR  CLEAR   Specific Gravity, Urine 1.015  1.005 - 1.030   pH 7.0  5.0 - 8.0   Glucose, UA NEGATIVE  NEGATIVE mg/dL   Hgb urine dipstick TRACE (*) NEGATIVE   Bilirubin Urine NEGATIVE  NEGATIVE   Ketones, ur NEGATIVE  NEGATIVE mg/dL   Protein, ur NEGATIVE  NEGATIVE mg/dL   Urobilinogen, UA 0.2  0.0 - 1.0 mg/dL   Nitrite NEGATIVE  NEGATIVE   Leukocytes, UA NEGATIVE  NEGATIVE  URINE MICROSCOPIC-ADD ON     Status: Abnormal   Collection Time    09/10/13  6:30 AM      Result Value Range   Squamous Epithelial / LPF FEW (*) RARE   RBC / HPF 0-2  <3 RBC/hpf   Bacteria, UA RARE  RARE  WET PREP, GENITAL     Status: Abnormal   Collection Time    09/10/13  7:20 AM      Result Value Range   Yeast Wet Prep HPF POC NONE SEEN  NONE SEEN   Trich, Wet Prep NONE SEEN  NONE SEEN   Clue Cells Wet Prep HPF POC FEW (*) NONE SEEN   WBC, Wet Prep HPF POC TOO NUMEROUS TO COUNT (*) NONE SEEN   IMAGING   MAU COURSE  ASSESSMENT 1. Marginal placenta previa, second trimester    PLAN Discharge home in stable condition per consult w/ Dr. Ellyn Hack. Plan anatomy scan at [facility]. Pelvic rest until Previa resolved.    Bleeding precautions.     Follow-up Information   Follow up with [facility] On 09/12/2013. (as scheduled or as needed if symptoms worsen)    Contact information:   [address] [place] [address] [phone]      Follow up with [facility] [place] MATERNITY ADMISSIONS. (As needed if symptoms worsen)    Contact information:   [address] [id number] [place] [address] [phone]       Medication List         acetaminophen 500 MG tablet  Commonly known as:  TYLENOL  Take 500 mg by mouth every 6 (six) hours as needed for pain (stomach pain).     prenatal multivitamin Tabs tablet  Take 1 tablet by mouth every evening.       [place], CNM 09/10/2013  8:53 AM

## 2013-09-11 LAB — GC/CHLAMYDIA PROBE AMP: CT Probe RNA: NEGATIVE

## 2013-09-21 IMAGING — US US TRANSVAGINAL NON-OB
1 series · 13 of 25 positions shown · non-contrast
Comparison: None.

CLINICAL DATA: Early pregnancy with vaginal bleeding.  Expected
estimated gestational age by LMP of 10 weeks 4 days.

OBSTETRIC <14 WK US AND TRANSVAGINAL OB US
TECHNIQUE: Both transabdominal and transvaginal ultrasound
examinations were performed for complete evaluation of the
gestation as well as the maternal uterus, adnexal regions, and
pelvic cul-de-sac.  Transvaginal technique was performed to assess
early pregnancy.

[Series 1: us transvaginal non-ob · 0.30mm/px · 66 acquisitions, 13 frames shown]
[im 1/66]
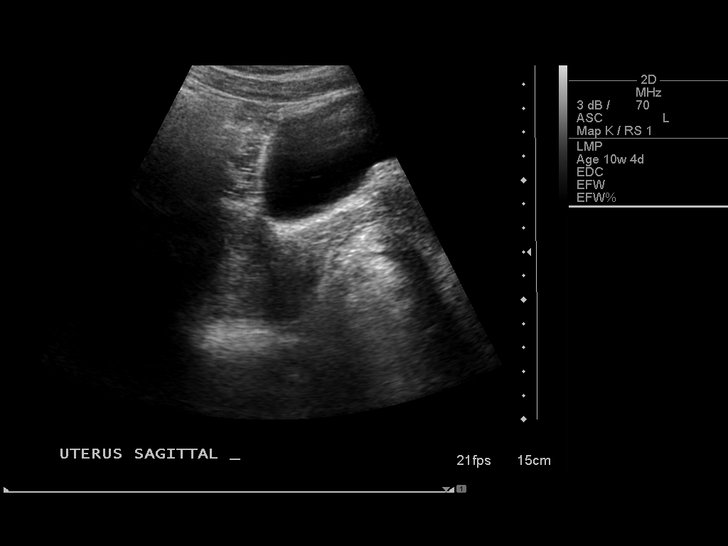
[im 6/66]
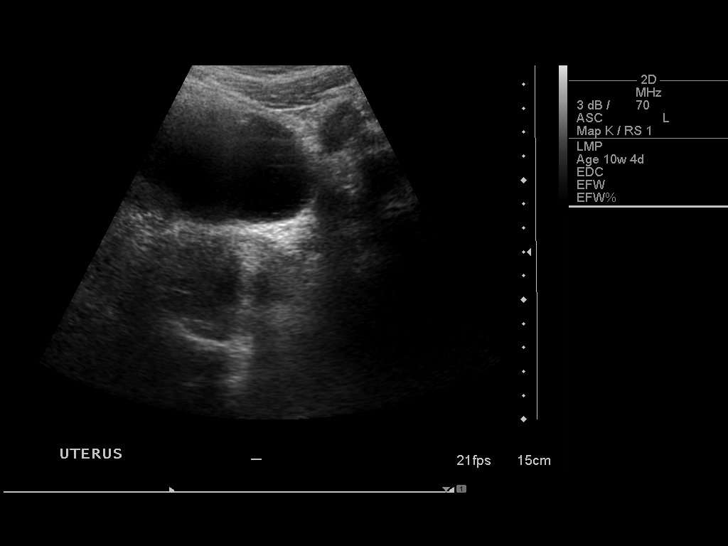
[im 11/66]
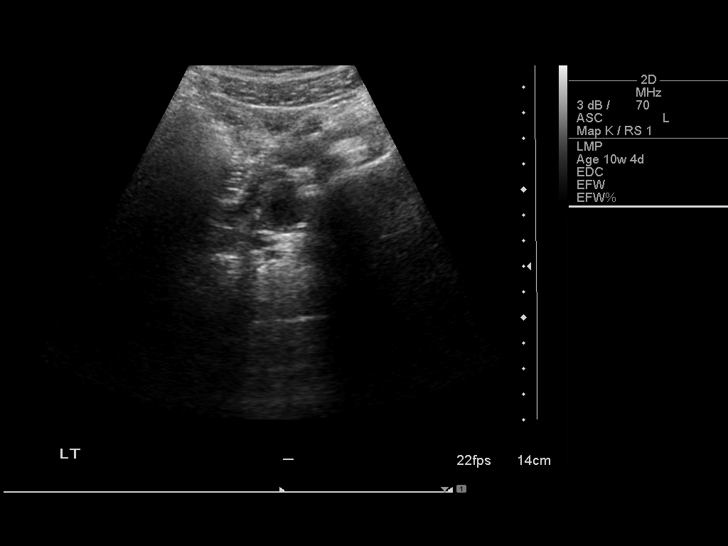
[im 17/66]
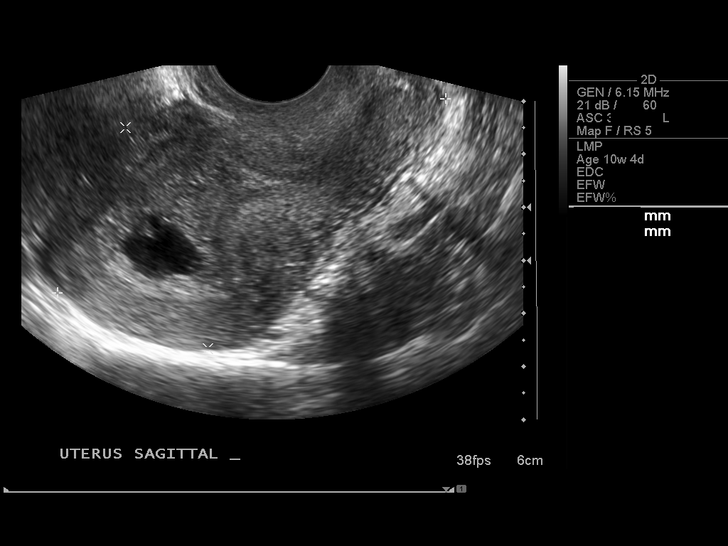
[im 22/66]
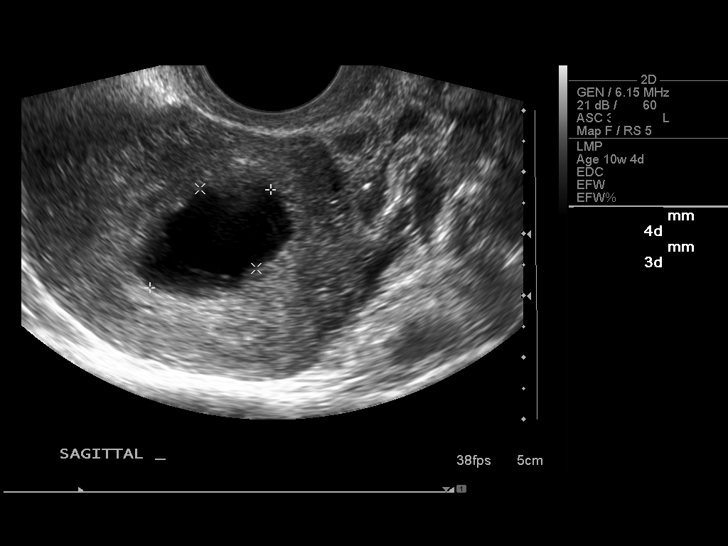
[im 28/66]
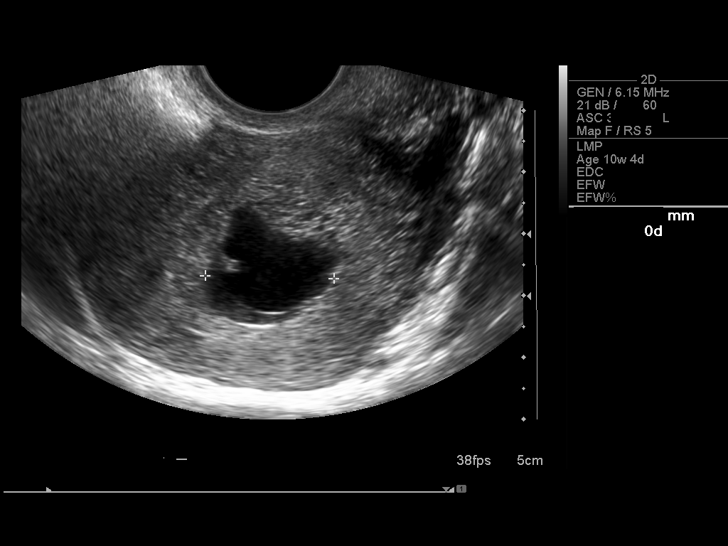
[im 33/66]
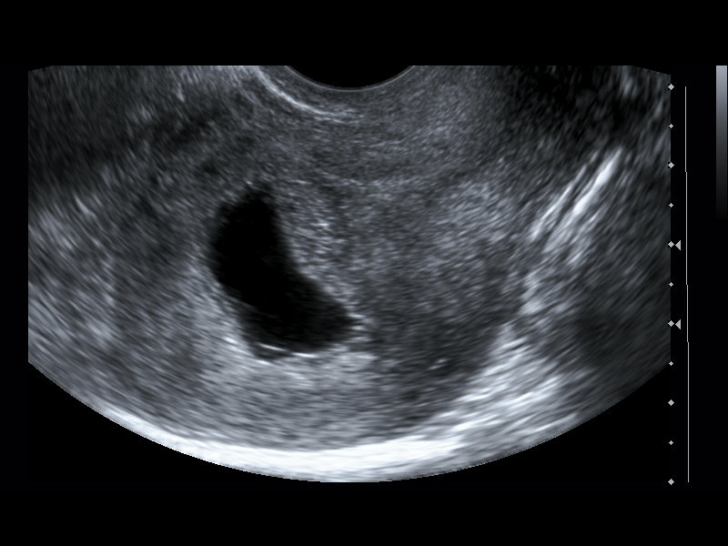
[im 38/66]
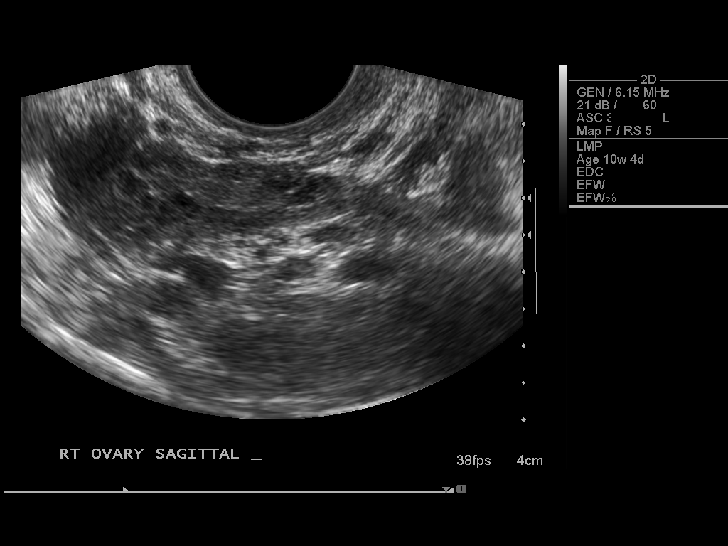
[im 44/66]
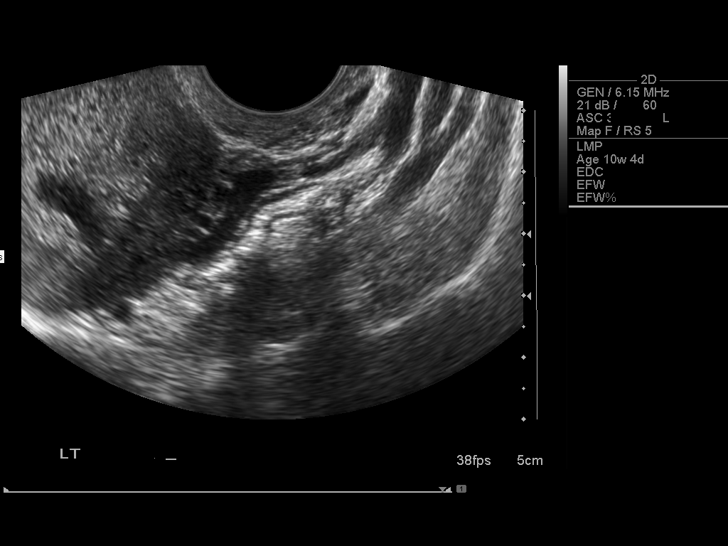
[im 49/66]
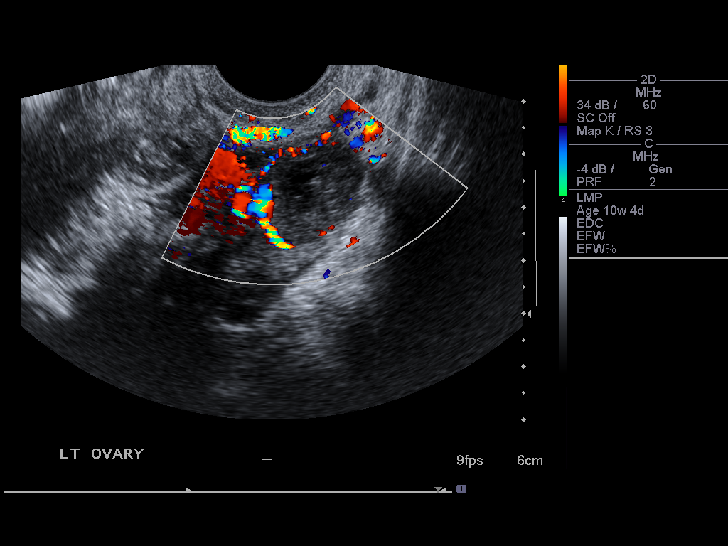
[im 55/66]
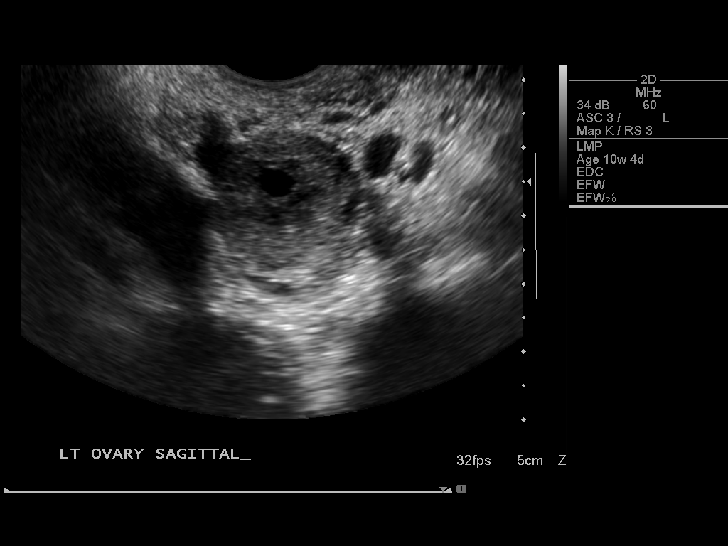
[im 60/66]
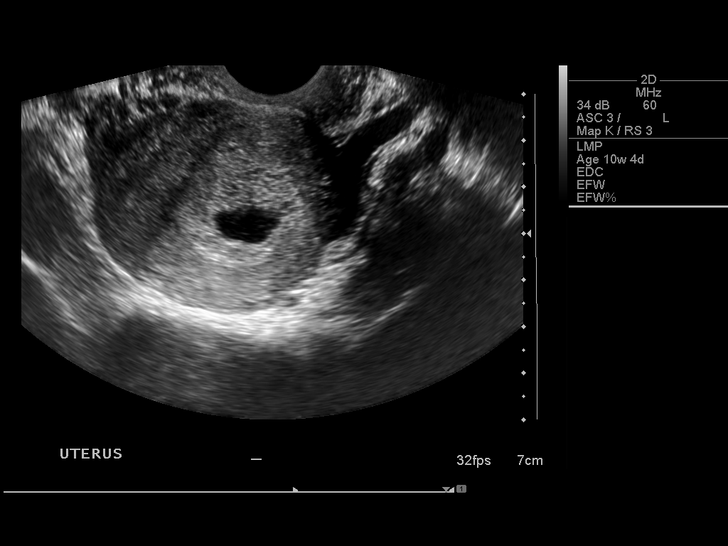
[im 66/66]
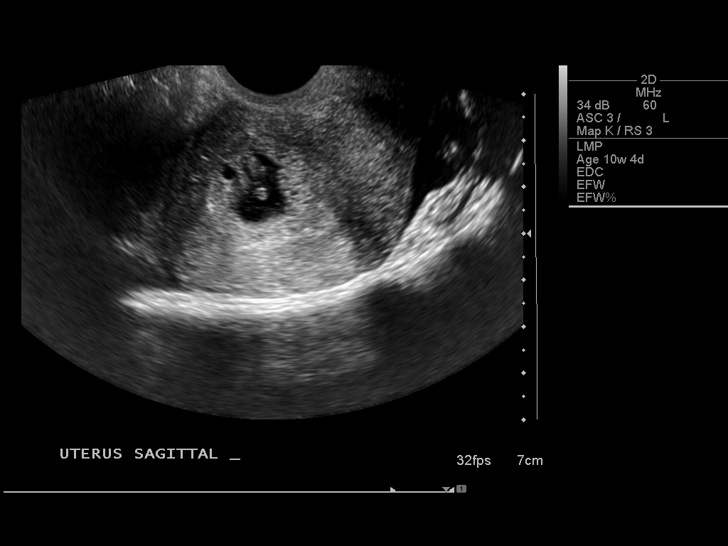

[13 of 25 positions shown; findings below may reference images not displayed]

Intrauterine gestational sac:  Visualized/normal in shape.
Yolk sac: Probable
Embryo: Question early
Cardiac Activity: Not seen
Heart Rate: Not applicable bpm

MSD: 20.6 mm  7 w 0 d
CRL: 2.3  mm  5 w  5 d        US EDC: 11/15/2013

Maternal uterus/adnexae:
Both ovaries are seen and have a normal appearance with the right
ovary measuring 2.9 x 1.0 x 1.0 cm and the left ovary measuring
x 2.3 x 2.6 cm and containing a corpus luteum.  A small amount of
simple free fluid is noted in the cul-de-sac.
IMPRESSION: Very irregular gestational sac with poor chorionic reaction.  An
amnion is seen with probable yolk sac and questionable early fetal
pole.  No cardiac activity is seen but would not necessarily be
expected at today's crown-rump length of 2.3 mm.  Overall
appearance of the gestational sac is worrisome for a nonprogressing
gestation but follow-up evaluation due to questionable viability is
recommended in 1 week to assess for appropriate interval growth and
expected development of cardiac activity.

Normal ovaries

## 2013-11-28 ENCOUNTER — Encounter (HOSPITAL_COMMUNITY): Payer: Self-pay | Admitting: *Deleted

## 2013-11-28 ENCOUNTER — Inpatient Hospital Stay (HOSPITAL_COMMUNITY): Payer: BC Managed Care – PPO

## 2013-11-28 ENCOUNTER — Inpatient Hospital Stay (HOSPITAL_COMMUNITY)
Admission: AD | Admit: 2013-11-28 | Discharge: 2013-11-28 | Disposition: A | Discharge status: Home / Self Care | Payer: BC Managed Care – PPO | Source: Ambulatory Visit | Attending: Obstetrics and Gynecology | Admitting: Obstetrics and Gynecology

## 2013-11-28 DIAGNOSIS — O4703 False labor before 37 completed weeks of gestation, third trimester: Secondary | ICD-10-CM

## 2013-11-28 DIAGNOSIS — O4690 Antepartum hemorrhage, unspecified, unspecified trimester: Secondary | ICD-10-CM

## 2013-11-28 DIAGNOSIS — O4693 Antepartum hemorrhage, unspecified, third trimester: Secondary | ICD-10-CM

## 2013-11-28 DIAGNOSIS — O4402 Placenta previa specified as without hemorrhage, second trimester: Secondary | ICD-10-CM | POA: Diagnosis not present

## 2013-11-28 DIAGNOSIS — O4700 False labor before 37 completed weeks of gestation, unspecified trimester: Secondary | ICD-10-CM | POA: Insufficient documentation

## 2013-11-28 LAB — TYPE AND SCREEN: ABO/RH(D): O POS

## 2013-11-28 LAB — URINALYSIS, ROUTINE W REFLEX MICROSCOPIC
Glucose, UA: NEGATIVE mg/dL
Ketones, ur: 15 mg/dL — AB
Leukocytes, UA: NEGATIVE
Nitrite: NEGATIVE
Protein, ur: NEGATIVE mg/dL
Urobilinogen, UA: 0.2 mg/dL (ref 0.0–1.0)

## 2013-11-28 LAB — CBC
MCH: 29.7 pg (ref 26.0–34.0)
Platelets: 192 10*3/uL (ref 150–400)
RBC: 4.01 MIL/uL (ref 3.87–5.11)
RDW: 14.2 % (ref 11.5–15.5)
WBC: 12.1 10*3/uL — ABNORMAL HIGH (ref 4.0–10.5)

## 2013-11-28 LAB — URINE MICROSCOPIC-ADD ON

## 2013-11-28 LAB — ABO/RH: ABO/RH(D): O POS

## 2013-11-28 MED ORDER — SODIUM CHLORIDE 0.9 % IV SOLN: INTRAVENOUS | Status: DC

## 2013-11-28 MED ORDER — SODIUM CHLORIDE 0.9 % IV BOLUS (SEPSIS)
1000.0000 mL | Freq: Once | INTRAVENOUS | Status: AC
  Administered 2013-11-28: 1000 mL via INTRAVENOUS

## 2013-11-28 NOTE — MAU Provider Note (Signed)
History     CSN: [id number]  Arrival date and time: 11/28/13 2050   First Provider Initiated Contact with Patient 11/28/13 2107      Chief Complaint  Patient presents with  . Vaginal Bleeding   HPI This is a 32 y.o. at [redacted]w[redacted]d who was shopping today and felt fluid, finding "a lot of blood" when she went to the bathroom. No pain. Has had a previa which she was told had resolved as of 2-3 weeks ago. Had some bleeding a week or so ago and was told her cervix "was fried".  They treated her with antibiotics.   RN Note: Bright red vaginal bleeding that started around 1945. History of previa that has resolved. Denies any pain.        OB History   Grav Para Term Preterm Abortions TAB SAB Ect Mult Living   2    1  1          Past Medical History  Diagnosis Date  . Medical history non-contributory     Past Surgical History  Procedure Laterality Date  . No past surgeries      Family History  Problem Relation Age of Onset  . Hypertension Father   . Diabetes Maternal Aunt   . Hypertension Maternal Aunt   . Diabetes Maternal Uncle   . Hypertension Maternal Uncle   . Cancer Paternal Aunt     lung  . Cancer Paternal Grandmother     pancreas  . Stroke Paternal Grandfather     History  Substance Use Topics  . Smoking status: Never Smoker   . Smokeless tobacco: Never Used  . Alcohol Use: No     Comment: social    Allergies: No Known Allergies  Prescriptions prior to admission  Medication Sig Dispense Refill  . acetaminophen (TYLENOL) 500 MG tablet Take 500 mg by mouth every 6 (six) hours as needed for pain (stomach pain).       . Prenatal Vit-Fe Fumarate-FA (PRENATAL MULTIVITAMIN) TABS Take 1 tablet by mouth every evening.        Review of Systems  Constitutional: Negative for fever and chills.  Gastrointestinal: Negative for nausea, vomiting, abdominal pain, diarrhea and constipation.  Genitourinary: Negative for dysuria.  Neurological: Negative for dizziness and  headaches.   Physical Exam   Blood pressure 112/72, pulse 86, temperature 98.4 F (36.9 C), resp. rate 20, height 5' 3.5" (1.613 m), weight 78.019 kg (172 lb), last menstrual period 04/25/2013, SpO2 100.00%, unknown if currently breastfeeding.  Physical Exam  Constitutional: She is oriented to person, place, and time. She appears well-developed and well-nourished. No distress.  Cardiovascular: Normal rate.   Respiratory: Effort normal.  GI: Soft. She exhibits no distension. There is no tenderness. There is no rebound and no guarding.  Gravid, size = dates Nontender FHR reassuring Appears to have mild contractions every 3-4 minutes   Genitourinary: Uterus normal. Vaginal discharge (plum size clot in vagina, small clot adherent inside os) found.  Cervix may be fingertip, unsure,  Since there was a clot adherent inside cervix, I did not probe further  Cervix is long.  Musculoskeletal: Normal range of motion.  Neurological: She is alert and oriented to person, place, and time.  Skin: Skin is warm and dry.  Psychiatric: She has a normal mood and affect.    MAU Course  Procedures  MDM Will check CBC and [place].  Assessment and Plan  Report to Ivonne Andrew CNM  [facility] 11/28/2013, 9:17 PM  Dorathy Kinsman, CNM assumed care of patient at 9:15 PM. Ultrasound ordered.  Only limited ultrasound: Anterior placenta, no previa, vertex, normal AFI 12.99. Cervical length 3.73 cm.  Results for orders placed during the hospital encounter of 11/28/13 (from the past 24 hour(s))  CBC     Status: Abnormal   Collection Time    11/28/13  9:39 PM      Result Value Range   WBC 12.1 (*) 4.0 - 10.5 K/uL   RBC 4.01  3.87 - 5.11 MIL/uL   Hemoglobin 11.9 (*) 12.0 - 15.0 g/dL   HCT 19.1 (*) 47.8 - 29.5 %   MCV 87.0  78.0 - 100.0 fL   MCH 29.7  26.0 - 34.0 pg   MCHC 34.1  30.0 - 36.0 g/dL   RDW 62.1  30.8 - 65.7 %   Platelets 192  150 - 400 K/uL  TYPE AND SCREEN     Status: None   Collection  Time    11/28/13  9:39 PM      Result Value Range   ABO/RH(D) O POS     Antibody Screen NEG     Sample Expiration 12/01/2013    URINALYSIS, ROUTINE W REFLEX MICROSCOPIC     Status: Abnormal   Collection Time    11/28/13 10:20 PM      Result Value Range   Color, Urine YELLOW  YELLOW   APPearance CLEAR  CLEAR   Specific Gravity, Urine 1.020  1.005 - 1.030   pH 6.5  5.0 - 8.0   Glucose, UA NEGATIVE  NEGATIVE mg/dL   Hgb urine dipstick LARGE (*) NEGATIVE   Bilirubin Urine NEGATIVE  NEGATIVE   Ketones, ur 15 (*) NEGATIVE mg/dL   Protein, ur NEGATIVE  NEGATIVE mg/dL   Urobilinogen, UA 0.2  0.0 - 1.0 mg/dL   Nitrite NEGATIVE  NEGATIVE   Leukocytes, UA NEGATIVE  NEGATIVE  URINE MICROSCOPIC-ADD ON     Status: Abnormal   Collection Time    11/28/13 10:20 PM      Result Value Range   Squamous Epithelial / LPF RARE  RARE   WBC, UA 0-2  <3 WBC/hpf   RBC / HPF 11-20  <3 RBC/hpf   Bacteria, UA FEW (*) RARE    Fetal heart rate category 1. Toco: Painless contractions every 4-9 minutes.  Dr. Ambrose Mantle notified of bleeding, ultrasound, contractions, cervical exam and category 1 tracing. No new orders. Discharge home on pelvic rest, bleeding precautions with followup in one week.  ASSESSMENT: Third trimester vaginal bleeding Preterm contractions.  PLAN: Discharge home and stable condition per consult with Dr. Ambrose Mantle. Pelvic rest x1 week. Bleeding precautions and preterm labor precautions. Fetal kick counts. Follow-up Information   Follow up with [facility]. Call on 12/01/2013. (To make an appointment within the next week or as needed if symptoms worsen)    Contact information:   [address] [place] [address] [phone]      Follow up with [facility] [place] MATERNITY ADMISSIONS. (As needed if symptoms worsen)    Contact information:   [address] [id number] [place] [address] [phone]       Medication List          acetaminophen 500 MG tablet  Commonly known as:  TYLENOL  Take 500 mg by mouth every 6 (six) hours as needed for pain (stomach pain).     prenatal multivitamin Tabs tablet  Take 1 tablet by mouth  every evening.       Verona Walk, CNM 11/28/2013 11:14 PM

## 2013-11-28 NOTE — MAU Note (Signed)
Bright red vaginal bleeding that started around 1945. History of previa that has resolved. Denies any pain.

## 2013-12-04 NOTE — L&D Delivery Note (Signed)
Delivery Note At 2:52 AM a viable and healthy female was delivered via Vaginal, Spontaneous Delivery (Presentation: ; Occiput Anterior).  APGAR: 8, 9; weight P.   Placenta status: Intact, Spontaneous.  Cord: 3 vessels with the following complications: None.   Anesthesia: Epidural  Episiotomy: None Lacerations: 1st degree;Labial;Perineal Suture Repair: 3.0 vicryl rapide Est. Blood Loss (mL): 450cc  Mom to postpartum.  Baby to Couplet care / Skin to Skin.  BOVARD,Aslynn Brunetti 01/18/2014, 3:15 AM  Br/O+/POPs/RI

## 2014-01-01 ENCOUNTER — Observation Stay (HOSPITAL_COMMUNITY)
Admission: AD | Admit: 2014-01-01 | Discharge: 2014-01-04 | Disposition: A | Discharge status: Home / Self Care | Payer: BC Managed Care – PPO | Source: Ambulatory Visit | Attending: Obstetrics and Gynecology | Admitting: Obstetrics and Gynecology

## 2014-01-01 ENCOUNTER — Inpatient Hospital Stay (HOSPITAL_COMMUNITY): Payer: BC Managed Care – PPO

## 2014-01-01 ENCOUNTER — Encounter (HOSPITAL_COMMUNITY): Payer: Self-pay

## 2014-01-01 DIAGNOSIS — O4700 False labor before 37 completed weeks of gestation, unspecified trimester: Secondary | ICD-10-CM | POA: Insufficient documentation

## 2014-01-01 DIAGNOSIS — O4690 Antepartum hemorrhage, unspecified, unspecified trimester: Principal | ICD-10-CM | POA: Insufficient documentation

## 2014-01-01 DIAGNOSIS — O4693 Antepartum hemorrhage, unspecified, third trimester: Secondary | ICD-10-CM | POA: Diagnosis present

## 2014-01-01 LAB — CBC
HCT: 35.5 % — ABNORMAL LOW (ref 36.0–46.0)
Hemoglobin: 12.1 g/dL (ref 12.0–15.0)
MCH: 29.1 pg (ref 26.0–34.0)
MCHC: 34.1 g/dL (ref 30.0–36.0)
MCV: 85.3 fL (ref 78.0–100.0)
PLATELETS: 184 10*3/uL (ref 150–400)
RBC: 4.16 MIL/uL (ref 3.87–5.11)
RDW: 14.8 % (ref 11.5–15.5)
WBC: 12.1 10*3/uL — AB (ref 4.0–10.5)

## 2014-01-01 LAB — PREPARE RBC (CROSSMATCH)

## 2014-01-01 MED ORDER — LACTATED RINGERS IV SOLN: INTRAVENOUS | Status: DC

## 2014-01-01 MED ORDER — TERBUTALINE SULFATE 1 MG/ML IJ SOLN
0.2500 mg | Freq: Once | INTRAMUSCULAR | Status: AC
  Administered 2014-01-01: 0.25 mg via SUBCUTANEOUS
  Filled 2014-01-01: qty 1

## 2014-01-01 MED ORDER — ACETAMINOPHEN 325 MG PO TABS
650.0000 mg | ORAL_TABLET | ORAL | Status: DC | PRN
Start: 2014-01-01 — End: 2014-01-04
  Administered 2014-01-01 – 2014-01-04 (×2): 650 mg via ORAL
  Filled 2014-01-01 (×3): qty 2

## 2014-01-01 MED ORDER — NIFEDIPINE 10 MG PO CAPS
20.0000 mg | ORAL_CAPSULE | ORAL | Status: DC | PRN
  Administered 2014-01-01 – 2014-01-04 (×6): 20 mg via ORAL
  Filled 2014-01-01 (×6): qty 2

## 2014-01-01 MED ORDER — PRENATAL MULTIVITAMIN CH
1.0000 | ORAL_TABLET | Freq: Every day | ORAL | Status: DC
  Administered 2014-01-02 – 2014-01-04 (×3): 1 via ORAL
  Filled 2014-01-01 (×5): qty 1

## 2014-01-01 MED ORDER — CALCIUM CARBONATE ANTACID 500 MG PO CHEW
2.0000 | CHEWABLE_TABLET | ORAL | Status: DC | PRN
  Filled 2014-01-01: qty 2

## 2014-01-01 MED ORDER — DOCUSATE SODIUM 100 MG PO CAPS
100.0000 mg | ORAL_CAPSULE | Freq: Every day | ORAL | Status: DC
  Administered 2014-01-03 – 2014-01-04 (×2): 100 mg via ORAL
  Filled 2014-01-01 (×5): qty 1

## 2014-01-01 MED ORDER — LACTATED RINGERS IV SOLN
INTRAVENOUS | Status: DC
  Administered 2014-01-01 (×2): via INTRAVENOUS

## 2014-01-01 MED ORDER — ZOLPIDEM TARTRATE 5 MG PO TABS: 5.0000 mg | ORAL_TABLET | Freq: Every evening | ORAL | Status: DC | PRN

## 2014-01-01 NOTE — MAU Note (Signed)
Pt presents from Dr. Paulene Floor office with bright red vaginal bleeding that started this am when she went to the bathroom. Reports good fetal movement. Denies other vaginal discharge or leaking of fluid.

## 2014-01-01 NOTE — MAU Provider Note (Signed)
History     CSN: [id number]  Arrival date and time: 01/01/14 4401   First Provider Initiated Contact with Patient 01/01/14 1013      Chief Complaint  Patient presents with  . Vaginal Bleeding   HPI This is a 33 y.o. female at [redacted]w[redacted]d who presents from the office for [place] and monitoring after developing some bleeding today. He removed a clot from the vagina and cervix was closed. Denies feeling any pain or contractions. + FM.   RN Note: Pt presents from Dr. Paulene Floor office with bright red vaginal bleeding that started this am when she went to the bathroom. Reports good fetal movement. Denies other vaginal discharge or leaking of fluid.        OB History   Grav Para Term Preterm Abortions TAB SAB Ect Mult Living   2    1  1          Past Medical History  Diagnosis Date  . Medical history non-contributory     Past Surgical History  Procedure Laterality Date  . No past surgeries      Family History  Problem Relation Age of Onset  . Hypertension Father   . Diabetes Maternal Aunt   . Hypertension Maternal Aunt   . Diabetes Maternal Uncle   . Hypertension Maternal Uncle   . Cancer Paternal Aunt     lung  . Cancer Paternal Grandmother     pancreas  . Stroke Paternal Grandfather     History  Substance Use Topics  . Smoking status: Never Smoker   . Smokeless tobacco: Never Used  . Alcohol Use: No     Comment: social    Allergies: No Known Allergies  Prescriptions prior to admission  Medication Sig Dispense Refill  . Acetaminophen (TYLENOL PO) Take 1 tablet by mouth daily as needed (headache).      . Docosahexaenoic Acid (PRENATAL DHA PO) Take 1 tablet by mouth daily.        Review of Systems  Constitutional: Negative for fever, chills and malaise/fatigue.  Gastrointestinal: Negative for nausea, vomiting and abdominal pain.  Genitourinary:       Vaginal bleeding   Neurological: Negative for dizziness and headaches.   Physical Exam   Blood pressure  131/77, pulse 97, temperature 97.7 F (36.5 C), temperature source Oral, resp. rate 18, last menstrual period 04/25/2013.  Physical Exam  Constitutional: She is oriented to person, place, and time. She appears well-developed and well-nourished. No distress.  HENT:  Head: Normocephalic.  Cardiovascular: Normal rate.   Respiratory: Effort normal.  GI: Soft. She exhibits no distension. There is no tenderness. There is no rebound and no guarding.  Genitourinary: Uterus normal. Vaginal discharge (scant blood, none on pad now) found.  Cervix reported closed at office FHR reactive UCs visible every 2-42min.  Musculoskeletal: Normal range of motion.  Neurological: She is alert and oriented to person, place, and time.  Skin: Skin is warm and dry.  Psychiatric: She has a normal mood and affect.    MAU Course  Procedures  MDM Discussed with Dr Willis Modena. Terbutaline ordered and [place] ordered. >>  UCs stopped then resumed.  PO fluids pushed Looked like UCs stopped for a while (awaiting [place] results), then toco readjusted and UCs are tracing every 2-4 min.   Got up to BR and had a large gush of blood. Had no bleeding entire time her on pad FHR still reactive. Dilation: Fingertip Effacement (%): 80 Exam by:: Adley Mazurowski, CNM  Assessment and  Plan  A:  SIUP at [redacted]w[redacted]d       Bleeding in third trimester, uknown origin      Preterm labor  P:  Discussed with Dr Willis Modena     Admit to Antenatal [facility] 01/01/2014, 12:57 PM   Preliminary [place] report:  Placenta anterior, Cervix 3.5cm, AFI normal.   Official reading to come.

## 2014-01-01 NOTE — Consult Note (Signed)
Neonatology Consult to Antenatal Patient:  I was asked by Dr. Willis Modena to see this patient in order to provide antenatal counseling due to onset of vaginal bleeding this morning, possible small abruption.  Ms. Chelsea Wise is admitted today at 42 4/[redacted] weeks GA. She is currently not having active labor. She is getting Terbutaline and Procardia and is under close observation.  I spoke with the patient and her fahter. We discussed the worst case of delivery in the next 1-2 days, including usual DR management, possible respiratory complications and need for support, IV access, feedings (mother wants to breast feed, which was encouraged), LOS, Mortality and Morbidity, and long term outcomes. She did not have any questions at this time. I offered a NICU tour to any interested family members and would be glad to come back if she has more questions later.  Thank you for asking me to see this patient.  Real Cons, MD Neonatologist  The total length of face-to-face or floor/unit time for this encounter was 20 minutes. Counseling and/or coordination of care was 15 minutes of the above.

## 2014-01-01 NOTE — MAU Note (Signed)
WHEN UPTO B-ROOM- STARTED LARGE AMT RED BLEEDING - NO PAIN-  BACK TO BED- FHR- 133

## 2014-01-01 NOTE — H&P (Signed)
Chelsea Wise is a 33 y.o. female, G2 P62, EGA [redacted]W[redacted]D with EDC 3-8 presenting for vaginal bleeding.  Pt walked into the office for eval this am after noticing bleeding which soaked through pants when she went to work.  Exam in office with moderate vaginal blood with 50 cc clot in vagina, cervix visually and digitally closed.  She was sent to MAU for further eval, has had small amount of bleeding with small clots off and on, irreg ctx treated with terb and procardia.  Ultrasound with no obvious source for bleeding, no previa, no visible abruption, nl AFV, vertex.  Prenatal care otherwise uncomplicated, see prenatal records for complete history.  Maternal Medical History:  Reason for admission: Vaginal bleeding.   Contractions: Frequency: irregular.   Perceived severity is mild.    Fetal activity: Perceived fetal activity is normal.      OB History   Grav Para Term Preterm Abortions TAB SAB Ect Mult Living   2    1  1         Past Medical History  Diagnosis Date  . Medical history non-contributory    Past Surgical History  Procedure Laterality Date  . No past surgeries     Family History: family history includes Cancer in her paternal aunt and paternal grandmother; Diabetes in her maternal aunt and maternal uncle; Hypertension in her father, maternal aunt, and maternal uncle; Stroke in her paternal grandfather. Social History:  reports that she has never smoked. She has never used smokeless tobacco. She reports that she does not drink alcohol or use illicit drugs.   Prenatal Transfer Tool  Maternal Diabetes: No Genetic Screening: Normal Maternal Ultrasounds/Referrals: Normal Fetal Ultrasounds or other Referrals:  None Maternal Substance Abuse:  No Significant Maternal Medications:  None Significant Maternal Lab Results:  None Other Comments:  None  Review of Systems  Respiratory: Negative.   Cardiovascular: Negative.     Dilation: Fingertip Effacement (%): 80 Exam  by:: Chelsea Wise, CNM Blood pressure 125/68, pulse 103, temperature 98.9 F (37.2 C), temperature source Oral, resp. rate 20, last menstrual period 04/25/2013. Maternal Exam:  Uterine Assessment: Contraction strength is mild.  Contraction frequency is irregular.   Abdomen: Patient reports no abdominal tenderness. Fetal presentation: vertex  Introitus: Normal vulva. Moderate blood with clot in vagina  Pelvis: adequate for delivery.   Cervix: Cervix evaluated by digital exam.     Fetal Exam Fetal Monitor Review: Mode: ultrasound.   Baseline rate: 140.  Variability: moderate (6-25 bpm).   Pattern: accelerations present and no decelerations.    Fetal State Assessment: Category I - tracings are normal.     Physical Exam  Constitutional: She appears well-developed and well-nourished.  Cardiovascular: Normal rate, regular rhythm and normal heart sounds.   No murmur heard. Respiratory: Effort normal and breath sounds normal. No respiratory distress. She has no wheezes.  GI: Soft.  Gravid, fundus NT    Prenatal labs: ABO, Rh: --/--/O POS (01/29 1514) Antibody: NEG (01/29 1514) Rubella:  Immune RPR:   Neg HBsAg:   Neg HIV:   NR GBS:    GCT:  94  Assessment/Plan: IUP at 34+ weeks with vaginal bleeding of unknown etiology, possible small abruption.  Will admit for observation, Procardia prn ctx, monitor for increased bleeding and/or fetal distress.  Will allow clear liquids, I have discussed c-section procedure and risks in case that becomes necessary.   Chelsea Wise D 01/01/2014, 5:37 PM

## 2014-01-02 NOTE — Progress Notes (Signed)
HD #2, [redacted]W[redacted]D, bleeding Feels ok, took one dose of Procardia overnight for ctx, has passed some small clots, no continuous bleeding, no bleeding in the past 3 hours Afeb, VSS FHT- Cat I, no decels, very reactive, occ irreg ctx Since baby looks great and bleeding is not heavy, will let her eat breakfast, change monitoring to q shift and monitor for increased bleeding.

## 2014-01-02 NOTE — Progress Notes (Signed)
Pt with evidence of vaginal bleeding by the passing of small clots in urine measure pan during toileting. No bleeding on pad at this time. C/O rectal pressure with sensation of having to move bowels.  Pressure and sensation relieved after passing clot size of golf ball.  No bleeding on peri pad after.

## 2014-01-02 NOTE — Progress Notes (Signed)
Patient ID: Chelsea Wise, female   DOB: [birth year], 32 y.o.   MRN: [id number] Pt doing much better throughout day, no significant VB and NST reactive on intermittent monitoring, no pain afeb vss Gravid NT  34 5/7 with likely partial abruption, last significant bleeding last night.  Will keep until 24 hours of no bleeding and if stable d/c Home on pelvic rest and decreased activity.  Pt agreeable to plan.

## 2014-01-03 NOTE — Progress Notes (Signed)
Patient ID: Chelsea Wise, female   DOB: January 27, 1981, 33 y.o.   MRN: [id number] 34 6/7 weeks Prob partial abruption/VB Pt slept good, noted a samll amount of VB when went to rest room about 1100pm and then when had BM about 5 AM had enough to coat the toilet paper, bright red No pain and FHR reactive Gravid, NT  D/w pt that bleeding is definitely improved, but given the large volume day of admission, I would like her to be bleed-free  for 24 hours before trying to go home.  No indication for delivery as fetal monitoring category 1 and bleeding much diminished. Will continue in-house observation and hopefully bleeding will completely resolve for pt to be able to be d/c

## 2014-01-04 MED ORDER — NIFEDIPINE 20 MG PO CAPS: 20.0000 mg | ORAL_CAPSULE | ORAL | Status: DC | PRN

## 2014-01-04 NOTE — Progress Notes (Signed)
Patient ID: Chelsea Wise, female   DOB: 08-14-1981, 33 y.o.   MRN: [id number] 35 0/7 weeks s/p probable partial abruption  Pt now with no bleeding in 24 hours.  Mild contractions yesterday that resolved with rest. Good FM and FHR Category 1 D/w pt that things seem to have stabilized and I am comfortable with d/c to home She has bleeding precautions and knows to call with any other significant bleeding  Given the volume of bleeding on admission, I am keeping her out of work this week, if she  Goes a week with no VB we may allow her to return.

## 2014-01-04 NOTE — Discharge Summary (Signed)
Physician Discharge Summary  Patient ID: Chelsea Wise MRN: [id number] DOB/AGE: 1981-01-26 33 y.o.  Admit date: 01/01/2014 Discharge date: 01/04/2014  Admission Diagnoses: Pregnancy at 34+ weeks                                        Vaginal bleeding and probable partial placental abruption--resolved  Discharge Diagnoses:  Active Problems:   Antepartum bleeding, third trimester   Discharged Condition: good  Hospital Course: Pt was admitted after presenting to office with bright red vaginal bleeding soaking through her pants.  Upon arrival to hospital, the bleeding diminished but continued throughout the day for about 24 hours intermittently.  She had some mild contractions which were treated with terbutaline and procardia.  Throughout the course of her stay the fetal status was reassuring and an US showed no visible abruption.  By hospital Day #4 she had not bled in over 24 hours.  We discussed bleeding precautions in detail and she lives about 8 miles from the hospital.  She will continue pelvic rest and decreased activity once at home.  I have instructed her not to work this week, but that if she has no further bleeding we will allow her to return next week.  She will be given procardia 20mg  po to use every 6 hours prn for contractions.   Consults: None  Significant Diagnostic Studies: radiology: Ultrasound: no abruption seen, no previa  Treatments: IV hydration and tocolytics  Discharge Exam: Blood pressure 134/85, pulse 71, temperature 98.2 F (36.8 C), temperature source Oral, resp. rate 20, height 5\' 4"  (1.626 m), weight 80.74 kg (178 lb), last menstrual period 04/25/2013. General appearance: alert and cooperative GI: normal findings: soft, non-tender, gravid  Disposition: 01-Home or Self Care      Discharge Orders   Future Orders Complete By Expires   Diet - low sodium heart healthy  As directed    Discharge instructions  As directed    Comments:     Call with any  moderate vaginal bleeding and return to hospital.  If baby not moving 5 times an hour call to report.  You can do light activity around the house but nothing strenuous.  Nothing in vagina--no sex or douching.   May shower / Bathe  As directed        Medication List         NIFEdipine 20 MG capsule  Commonly known as:  PROCARDIA  Take 1 capsule (20 mg total) by mouth every 4 (four) hours as needed (contractions).     PRENATAL DHA PO  Take 1 tablet by mouth daily.     TYLENOL PO  Take 1 tablet by mouth daily as needed (headache).       Follow-up Information   Follow up with BOVARD,JODY, MD In 6 days. (Keep appt friday 2/6 as scheduled)    Specialty:  Obstetrics and Gynecology   Contact information:   [address] [place] [address] [phone]       Signed: Logan Bores 01/04/2014, 10:15 AM

## 2014-01-04 NOTE — Progress Notes (Signed)
Patient discharge teaching completed with patient providing teach back and voicing an understanding. Patient left per wheelchair with husband at side.

## 2014-01-05 LAB — TYPE AND SCREEN
ABO/RH(D): O POS
Antibody Screen: NEGATIVE
UNIT DIVISION: 0
Unit division: 0

## 2014-01-09 LAB — OB RESULTS CONSOLE GBS: GBS: POSITIVE

## 2014-01-17 ENCOUNTER — Inpatient Hospital Stay (HOSPITAL_COMMUNITY): Payer: BC Managed Care – PPO | Admitting: Anesthesiology

## 2014-01-17 ENCOUNTER — Inpatient Hospital Stay (HOSPITAL_COMMUNITY)
Admission: AD | Admit: 2014-01-17 | Discharge: 2014-01-19 | DRG: 774 | Disposition: A | Discharge status: Home / Self Care | Payer: BC Managed Care – PPO | Source: Ambulatory Visit | Attending: Obstetrics and Gynecology | Admitting: Obstetrics and Gynecology

## 2014-01-17 ENCOUNTER — Encounter (HOSPITAL_COMMUNITY): Payer: BC Managed Care – PPO | Admitting: Anesthesiology

## 2014-01-17 ENCOUNTER — Encounter (HOSPITAL_COMMUNITY): Payer: Self-pay

## 2014-01-17 ENCOUNTER — Inpatient Hospital Stay (HOSPITAL_COMMUNITY): Payer: BC Managed Care – PPO

## 2014-01-17 DIAGNOSIS — O4590 Premature separation of placenta, unspecified, unspecified trimester: Principal | ICD-10-CM | POA: Diagnosis present

## 2014-01-17 DIAGNOSIS — O4690 Antepartum hemorrhage, unspecified, unspecified trimester: Secondary | ICD-10-CM

## 2014-01-17 DIAGNOSIS — Z369 Encounter for antenatal screening, unspecified: Secondary | ICD-10-CM

## 2014-01-17 HISTORY — DX: Complete placenta previa nos or without hemorrhage, unspecified trimester: O44.00

## 2014-01-17 LAB — CBC
HCT: 34.8 % — ABNORMAL LOW (ref 36.0–46.0)
Hemoglobin: 12 g/dL (ref 12.0–15.0)
MCH: 29 pg (ref 26.0–34.0)
MCHC: 34.5 g/dL (ref 30.0–36.0)
MCV: 84.1 fL (ref 78.0–100.0)
PLATELETS: 179 10*3/uL (ref 150–400)
RBC: 4.14 MIL/uL (ref 3.87–5.11)
RDW: 15 % (ref 11.5–15.5)
WBC: 18.4 10*3/uL — ABNORMAL HIGH (ref 4.0–10.5)

## 2014-01-17 LAB — TYPE AND SCREEN
ABO/RH(D): O POS
Antibody Screen: NEGATIVE

## 2014-01-17 MED ORDER — PHENYLEPHRINE 40 MCG/ML (10ML) SYRINGE FOR IV PUSH (FOR BLOOD PRESSURE SUPPORT)
80.0000 ug | PREFILLED_SYRINGE | INTRAVENOUS | Status: DC | PRN
  Filled 2014-01-17: qty 2

## 2014-01-17 MED ORDER — DEXTROSE 5 % IV SOLN
2.5000 10*6.[IU] | INTRAVENOUS | Status: DC
  Filled 2014-01-17 (×5): qty 2.5

## 2014-01-17 MED ORDER — EPHEDRINE 5 MG/ML INJ
10.0000 mg | INTRAVENOUS | Status: DC | PRN
  Filled 2014-01-17: qty 2
  Filled 2014-01-17: qty 4

## 2014-01-17 MED ORDER — OXYCODONE-ACETAMINOPHEN 5-325 MG PO TABS: 1.0000 | ORAL_TABLET | ORAL | Status: DC | PRN

## 2014-01-17 MED ORDER — FLEET ENEMA 7-19 GM/118ML RE ENEM: 1.0000 | ENEMA | RECTAL | Status: DC | PRN

## 2014-01-17 MED ORDER — LIDOCAINE HCL (PF) 1 % IJ SOLN
INTRAMUSCULAR | Status: DC | PRN
  Administered 2014-01-17 (×4): 4 mL

## 2014-01-17 MED ORDER — LACTATED RINGERS IV SOLN
INTRAVENOUS | Status: DC
  Administered 2014-01-17: 20:00:00 via INTRAVENOUS

## 2014-01-17 MED ORDER — ONDANSETRON HCL 4 MG/2ML IJ SOLN
4.0000 mg | Freq: Four times a day (QID) | INTRAMUSCULAR | Status: DC | PRN
  Administered 2014-01-17: 4 mg via INTRAVENOUS
  Filled 2014-01-17: qty 2

## 2014-01-17 MED ORDER — LACTATED RINGERS IV SOLN
500.0000 mL | Freq: Once | INTRAVENOUS | Status: AC
Start: 2014-01-17 — End: 2014-01-17
  Administered 2014-01-17: 500 mL via INTRAVENOUS

## 2014-01-17 MED ORDER — EPHEDRINE 5 MG/ML INJ
10.0000 mg | INTRAVENOUS | Status: DC | PRN
  Filled 2014-01-17: qty 2

## 2014-01-17 MED ORDER — LIDOCAINE HCL (PF) 1 % IJ SOLN
30.0000 mL | INTRAMUSCULAR | Status: DC | PRN
  Filled 2014-01-17: qty 30

## 2014-01-17 MED ORDER — PENICILLIN G POTASSIUM 5000000 UNITS IJ SOLR
5.0000 10*6.[IU] | Freq: Once | INTRAVENOUS | Status: AC
  Administered 2014-01-17: 5 10*6.[IU] via INTRAVENOUS
  Filled 2014-01-17: qty 5

## 2014-01-17 MED ORDER — CITRIC ACID-SODIUM CITRATE 334-500 MG/5ML PO SOLN: 30.0000 mL | ORAL | Status: DC | PRN

## 2014-01-17 MED ORDER — PHENYLEPHRINE 40 MCG/ML (10ML) SYRINGE FOR IV PUSH (FOR BLOOD PRESSURE SUPPORT)
80.0000 ug | PREFILLED_SYRINGE | INTRAVENOUS | Status: DC | PRN
  Filled 2014-01-17: qty 10
  Filled 2014-01-17: qty 2

## 2014-01-17 MED ORDER — ACETAMINOPHEN 325 MG PO TABS
650.0000 mg | ORAL_TABLET | ORAL | Status: DC | PRN
  Administered 2014-01-18: 650 mg via ORAL
  Filled 2014-01-17: qty 2

## 2014-01-17 MED ORDER — LACTATED RINGERS IV SOLN: 500.0000 mL | INTRAVENOUS | Status: DC | PRN

## 2014-01-17 MED ORDER — OXYTOCIN 40 UNITS IN LACTATED RINGERS INFUSION - SIMPLE MED
62.5000 mL/h | INTRAVENOUS | Status: DC
Start: 2014-01-17 — End: 2014-01-18
  Administered 2014-01-18: 62.5 mL/h via INTRAVENOUS
  Filled 2014-01-17: qty 1000

## 2014-01-17 MED ORDER — IBUPROFEN 600 MG PO TABS
600.0000 mg | ORAL_TABLET | Freq: Four times a day (QID) | ORAL | Status: DC | PRN
  Administered 2014-01-18: 600 mg via ORAL
  Filled 2014-01-17: qty 1

## 2014-01-17 MED ORDER — DIPHENHYDRAMINE HCL 50 MG/ML IJ SOLN: 12.5000 mg | INTRAMUSCULAR | Status: DC | PRN

## 2014-01-17 MED ORDER — FENTANYL 2.5 MCG/ML BUPIVACAINE 1/10 % EPIDURAL INFUSION (WH - ANES)
14.0000 mL/h | INTRAMUSCULAR | Status: DC | PRN
Start: 2014-01-17 — End: 2014-01-18
  Administered 2014-01-17: 14 mL/h via EPIDURAL
  Filled 2014-01-17: qty 125

## 2014-01-17 MED ORDER — OXYTOCIN BOLUS FROM INFUSION: 500.0000 mL | INTRAVENOUS | Status: DC

## 2014-01-17 NOTE — MAU Provider Note (Signed)
  History     CSN: [id number]  Arrival date and time: 01/17/14 1522   First Provider Initiated Contact with Patient 01/17/14 1622      Chief Complaint  Patient presents with  . Vaginal Bleeding   HPI Chelsea Wise 33 y.o. [redacted]w[redacted]d   Had episode of bleeding while on the toilet about noon today.  Has had previous episodes of bleeding during this pregnancy.  Was hospitalized earlier this month and was discharged.  Noted she was having contractions prior to the beginning of the bleeding today.  OB History   Grav Para Term Preterm Abortions TAB SAB Ect Mult Living   2    1  1          Past Medical History  Diagnosis Date  . Medical history non-contributory   . Placenta previa     Past Surgical History  Procedure Laterality Date  . No past surgeries      Family History  Problem Relation Age of Onset  . Hypertension Father   . Diabetes Maternal Aunt   . Hypertension Maternal Aunt   . Diabetes Maternal Uncle   . Hypertension Maternal Uncle   . Cancer Paternal Aunt     lung  . Cancer Paternal Grandmother     pancreas  . Stroke Paternal Grandfather     History  Substance Use Topics  . Smoking status: Never Smoker   . Smokeless tobacco: Never Used  . Alcohol Use: No     Comment: social    Allergies: No Known Allergies  Prescriptions prior to admission  Medication Sig Dispense Refill  . Acetaminophen (TYLENOL PO) Take 1 tablet by mouth daily as needed (headache).      . Docosahexaenoic Acid (PRENATAL DHA PO) Take 1 tablet by mouth daily.      Marland Kitchen NIFEdipine (PROCARDIA) 20 MG capsule Take 1 capsule (20 mg total) by mouth every 4 (four) hours as needed (contractions).  30 capsule  1    Review of Systems  Constitutional: Negative for fever.  Gastrointestinal: Positive for abdominal pain. Negative for nausea and vomiting.  Genitourinary:       Vaginal bleeding    Physical Exam   Blood pressure 131/83, pulse 98, temperature 98.3 F (36.8 C), temperature source  Oral, resp. rate 18, height 5' 3.5" (1.613 m), weight 182 lb (82.555 kg), last menstrual period 04/25/2013.  Physical Exam  Nursing note and vitals reviewed. Constitutional: She is oriented to person, place, and time. She appears well-developed and well-nourished.  HENT:  Head: Normocephalic.  Eyes: EOM are normal.  Neck: Neck supple.  Respiratory: Effort normal.  GI: Soft. There is no tenderness.  Contractions noted on monitor strip  Genitourinary:  Speculum exam: Vagina - Full of red blood, pouring out the speculum on exam, cleared vaginal of blood and clots,  Cervix - clot seen at cervix, active bleeding has subsided Bimanual exam: Cervix - barely able to tip - 2 cm?? Chaperone present for exam.  Musculoskeletal: Normal range of motion.  Neurological: She is alert and oriented to person, place, and time.  Skin: Skin is warm and dry.  Psychiatric: She has a normal mood and affect.    MAU Course  Procedures  MDM Consult with Dr. Melba Coon at [phone] Will get ultrasound  Assessment and Plan  Will be admitted per Dr. Melba Coon.  Orders to be given by Dr. Melba Coon.  BURLESON,TERRI 01/17/2014, 4:34 PM

## 2014-01-17 NOTE — Progress Notes (Signed)
Notified of pt bleeding and contraction pattern. Received orders to admit to birthing suites

## 2014-01-17 NOTE — Progress Notes (Signed)
Patient ID: Chelsea Wise, female   DOB: [birth year], 33 y.o.   MRN: [id number]  Comfortable with epidural.  Tired!  AF VSS, since epidural, BP elevated, will monitor  gen NAD FHTs 140-150, category 1 toco q 1-3  SVE 5/90/0  Seems ruptured.  33yo G2P0 at 36+ with likely abruption, VB in 3rd trimester Continue PCN and IOL, expect SVD

## 2014-01-17 NOTE — Anesthesia Procedure Notes (Signed)
Epidural Patient location during procedure: OB Start time: 01/17/2014 9:23 PM  Staffing Performed by: anesthesiologist   Preanesthetic Checklist Completed: patient identified, site marked, surgical consent, pre-op evaluation, timeout performed, IV checked, risks and benefits discussed and monitors and equipment checked  Epidural Patient position: sitting Prep: site prepped and draped and DuraPrep Patient monitoring: continuous pulse ox and blood pressure Approach: midline Injection technique: LOR air  Needle:  Needle type: Tuohy  Needle gauge: 17 G Needle length: 9 cm and 9 Needle insertion depth: 6.5 cm Catheter type: closed end flexible Catheter size: 19 Gauge Catheter at skin depth: 11.5 cm Test dose: negative  Assessment Events: blood not aspirated, injection not painful, no injection resistance, negative IV test and no paresthesia  Additional Notes Discussed risk of headache, infection, bleeding, nerve injury and failed or incomplete block.  Patient voices understanding and wishes to proceed.  Epidural placed easily on first attempt.  No paresthesia.  Patient tolerated procedure well with no apparent complications.  Charlton Haws, MDReason for block:procedure for pain

## 2014-01-17 NOTE — Progress Notes (Addendum)
Patient ID: Chelsea Wise, female   DOB: 07-Dec-1980, 33 y.o.   MRN: [id number]  No c/o's.  Cont VB.  Getting more uncomfortable with ctx.  AFVSS  gen NAD FHTs 145-150 mod var toco Q 2 -3 min  SVE 3/80/-2 per RN\  D/w pt likely ROM - thinner bleeding  Will continue current mgmt, after C/S on different patient will start IOL with pitocin since no cervical change.    Pt desires epidural after discussion of r/b/a given situation and VB

## 2014-01-17 NOTE — MAU Note (Signed)
Per [place], RN charge, pt to go to room 162

## 2014-01-17 NOTE — Anesthesia Preprocedure Evaluation (Signed)
Anesthesia Evaluation  Patient identified by MRN, date of birth, ID band Patient awake    Reviewed: Allergy & Precautions, H&P , NPO status , Patient's Chart, lab work & pertinent test results, reviewed documented beta blocker date and time   History of Anesthesia Complications Negative for: history of anesthetic complications  Airway Mallampati: III TM Distance: >3 FB Neck ROM: full    Dental  (+) Teeth Intact   Pulmonary neg pulmonary ROS,  breath sounds clear to auscultation        Cardiovascular negative cardio ROS  Rhythm:regular Rate:Normal     Neuro/Psych negative neurological ROS  negative psych ROS   GI/Hepatic negative GI ROS, Neg liver ROS,   Endo/Other  negative endocrine ROSBMI 32  Renal/GU negative Renal ROS     Musculoskeletal   Abdominal   Peds  Hematology negative hematology ROS (+)   Anesthesia Other Findings   Reproductive/Obstetrics (+) Pregnancy (vaginal bleeding)                           Anesthesia Physical Anesthesia Plan  ASA: II  Anesthesia Plan: Epidural   Post-op Pain Management:    Induction:   Airway Management Planned:   Additional Equipment:   Intra-op Plan:   Post-operative Plan:   Informed Consent: I have reviewed the patients History and Physical, chart, labs and discussed the procedure including the risks, benefits and alternatives for the proposed anesthesia with the patient or authorized representative who has indicated his/her understanding and acceptance.     Plan Discussed with:   Anesthesia Plan Comments:         Anesthesia Quick Evaluation

## 2014-01-17 NOTE — H&P (Addendum)
Chelsea Wise is a 33 y.o. female G2P0010 at 82+6 with VB.  Has had VB x 3 in 3rd trimester, late December, late January and today.  Was hospitalized with VB in January.  D/W MFM agree with IOL and delivery.  +FM, ?LOF, increasing VB, occ ctx, increasing in intensity and frequency.  Had previa, resolved.  No abruption noted on [place] 1/29, also no reason for VB on [place] today 2/14.  Maternal Medical History:  Reason for admission: Vaginal bleeding.   Contractions: Frequency: irregular.   Perceived severity is moderate.    Fetal activity: Perceived fetal activity is normal.    Prenatal complications: Bleeding.   Prenatal Complications - Diabetes: none.    OB History   Grav Para Term Preterm Abortions TAB SAB Ect Mult Living   2    1  1        G1 SAB G2 present  No abn pap H/O trich   Past Medical History  Diagnosis Date  . Medical history non-contributory   . Placenta previa   HA  Past Surgical History  Procedure Laterality Date  . No past surgeries     Family History: family history includes Cancer in her paternal aunt and paternal grandmother; Diabetes in her maternal aunt and maternal uncle; Hypertension in her father, maternal aunt, and maternal uncle; Stroke in her paternal grandfather. Social History:  reports that she has never smoked. She has never used smokeless tobacco. She reports that she does not drink alcohol or use illicit drugs. Meds PNV All NKDA   Prenatal Transfer Tool  Maternal Diabetes: No Genetic Screening: Normal Maternal Ultrasounds/Referrals: Abnormal:  Findings:   Other:previa resolved Fetal Ultrasounds or other Referrals:  None Maternal Substance Abuse:  No Significant Maternal Medications:  None Significant Maternal Lab Results:  Lab values include: Group B Strep positive Other Comments:  3rd trimester VB  Review of Systems  Constitutional: Negative.   HENT: Negative.   Eyes: Negative.   Respiratory: Negative.   Cardiovascular: Negative.    Gastrointestinal: Negative.   Genitourinary: Negative.   Musculoskeletal: Negative.   Skin: Negative.   Neurological: Negative.   Psychiatric/Behavioral: Negative.     Dilation: 3 Effacement (%): 80 Station: -2 Exam by:: dr bovard Blood pressure 131/83, pulse 98, temperature 98.3 F (36.8 C), temperature source Oral, resp. rate 18, height 5' 3.5" (1.613 m), weight 82.555 kg (182 lb), last menstrual period 04/25/2013. Maternal Exam:  Uterine Assessment: Contraction strength is moderate.  Contraction frequency is irregular.   Abdomen: Fundal height is appropriate for gestation.   Estimated fetal weight is 7-8#.   Fetal presentation: vertex  Introitus: Normal vulva. Normal vagina.  Pelvis: adequate for delivery.   Cervix: Cervix evaluated by sterile speculum exam and digital exam.     Physical Exam  Constitutional: She is oriented to person, place, and time. She appears well-developed and well-nourished.  HENT:  Head: Normocephalic and atraumatic.  Cardiovascular: Normal rate and regular rhythm.   Respiratory: Effort normal and breath sounds normal. No respiratory distress. She has no wheezes.  GI: Soft. Bowel sounds are normal. She exhibits no distension. There is no tenderness.  Musculoskeletal: Normal range of motion.  Neurological: She is alert and oriented to person, place, and time.  Skin: Skin is warm and dry.  Psychiatric: She has a normal mood and affect. Her behavior is normal.    Prenatal labs: ABO, Rh: --/--/O POS (01/29 1514) Antibody: NEG (01/29 1514) Rubella:  Immune RPR: Nonreactive (08/13 0000)  HBsAg:  Negative (08/13 0000)  HIV: Non-reactive (08/15 0000)  GBS:   positive  Hgb 13.2/Hgb electro WNL/ GC neg/ Chl neg/AFP WNL/glucola 94/CF neg  Nl anat, ant plac previa, female Good growth, previa resolved, vtx  Tdap 12/14   Assessment/Plan: 32yo G2P0010 at 36+ for IOL given VB  Pitocin prn PCN for prophylaxis D/w pt r/b/a of LTCS including but  not limited to bleeding, infection, damage to surrounding organs, injury to infant, and trouble healing.  Pt voices understanding    BOVARD,Britnay Magnussen 01/17/2014, 6:34 PM

## 2014-01-17 NOTE — MAU Note (Signed)
Pt states around noon blood came pouring out while on commode. Now has lightened up, having lower abdominal pain rated 7/10 that began this am.

## 2014-01-17 NOTE — MAU Note (Signed)
Arrived in room to take pt to ultrasound. Bright red bleeding observed in bed and on legs when she stood up to move to chair. T Burleson notified and pt taken to ultrasound by [facility]

## 2014-01-18 ENCOUNTER — Encounter (HOSPITAL_COMMUNITY): Payer: Self-pay | Admitting: Obstetrics and Gynecology

## 2014-01-18 LAB — CBC
HCT: 28.3 % — ABNORMAL LOW (ref 36.0–46.0)
Hemoglobin: 9.8 g/dL — ABNORMAL LOW (ref 12.0–15.0)
MCH: 28.9 pg (ref 26.0–34.0)
MCHC: 34.3 g/dL (ref 30.0–36.0)
MCV: 84.2 fL (ref 78.0–100.0)
Platelets: 160 10*3/uL (ref 150–400)
RBC: 3.36 MIL/uL — ABNORMAL LOW (ref 3.87–5.11)
RDW: 15.2 % (ref 11.5–15.5)
WBC: 26.2 10*3/uL — ABNORMAL HIGH (ref 4.0–10.5)

## 2014-01-18 LAB — RPR: RPR Ser Ql: NONREACTIVE

## 2014-01-18 MED ORDER — OXYCODONE-ACETAMINOPHEN 5-325 MG PO TABS: 1.0000 | ORAL_TABLET | ORAL | Status: DC | PRN

## 2014-01-18 MED ORDER — ONDANSETRON HCL 4 MG/2ML IJ SOLN: 4.0000 mg | INTRAMUSCULAR | Status: DC | PRN

## 2014-01-18 MED ORDER — DIPHENHYDRAMINE HCL 25 MG PO CAPS: 25.0000 mg | ORAL_CAPSULE | Freq: Four times a day (QID) | ORAL | Status: DC | PRN

## 2014-01-18 MED ORDER — ONDANSETRON HCL 4 MG PO TABS: 4.0000 mg | ORAL_TABLET | ORAL | Status: DC | PRN

## 2014-01-18 MED ORDER — LANOLIN HYDROUS EX OINT: TOPICAL_OINTMENT | CUTANEOUS | Status: DC | PRN

## 2014-01-18 MED ORDER — BENZOCAINE-MENTHOL 20-0.5 % EX AERO
1.0000 "application " | INHALATION_SPRAY | CUTANEOUS | Status: DC | PRN
  Administered 2014-01-18: 1 via TOPICAL
  Filled 2014-01-18: qty 56

## 2014-01-18 MED ORDER — ZOLPIDEM TARTRATE 5 MG PO TABS: 5.0000 mg | ORAL_TABLET | Freq: Every evening | ORAL | Status: DC | PRN

## 2014-01-18 MED ORDER — SIMETHICONE 80 MG PO CHEW: 80.0000 mg | CHEWABLE_TABLET | ORAL | Status: DC | PRN

## 2014-01-18 MED ORDER — WITCH HAZEL-GLYCERIN EX PADS: 1.0000 "application " | MEDICATED_PAD | CUTANEOUS | Status: DC | PRN

## 2014-01-18 MED ORDER — LACTATED RINGERS IV SOLN: INTRAVENOUS | Status: DC

## 2014-01-18 MED ORDER — PRENATAL MULTIVITAMIN CH
1.0000 | ORAL_TABLET | Freq: Every day | ORAL | Status: DC
  Administered 2014-01-18 – 2014-01-19 (×2): 1 via ORAL
  Filled 2014-01-18 (×2): qty 1

## 2014-01-18 MED ORDER — DIBUCAINE 1 % RE OINT: 1.0000 "application " | TOPICAL_OINTMENT | RECTAL | Status: DC | PRN

## 2014-01-18 MED ORDER — SENNOSIDES-DOCUSATE SODIUM 8.6-50 MG PO TABS
2.0000 | ORAL_TABLET | ORAL | Status: DC
  Administered 2014-01-19: 2 via ORAL
  Filled 2014-01-18: qty 2

## 2014-01-18 MED ORDER — IBUPROFEN 600 MG PO TABS
600.0000 mg | ORAL_TABLET | Freq: Four times a day (QID) | ORAL | Status: DC
  Administered 2014-01-18 – 2014-01-19 (×4): 600 mg via ORAL
  Filled 2014-01-18 (×5): qty 1

## 2014-01-18 NOTE — Anesthesia Postprocedure Evaluation (Signed)
  Anesthesia Post-op Note  Anesthesia Post Note  Patient: Chelsea Wise  Procedure(s) Performed: * No procedures listed *  Anesthesia type: Epidural  Patient location: Mother/Baby  Post pain: Pain level controlled  Post assessment: Post-op Vital signs reviewed  Last Vitals:  Filed Vitals:   01/18/14 1042  BP: 114/58  Pulse: 86  Temp: 36.6 C  Resp: 18    Post vital signs: Reviewed  Level of consciousness:alert  Complications: No apparent anesthesia complications

## 2014-01-18 NOTE — Progress Notes (Signed)
Post Partum Day 0 Subjective: no complaints, up ad lib, voiding, tolerating PO and nl lochia, pain controlled  Objective: Blood pressure 126/59, pulse 88, temperature 97.1 F (36.2 C), temperature source Oral, resp. rate 20, height 5' 3.5" (1.613 m), weight 82.555 kg (182 lb), last menstrual period 04/25/2013, SpO2 97.00%, unknown if currently breastfeeding.  Physical Exam:  General: alert and no distress Lochia: appropriate Uterine Fundus: firm   Recent Labs  01/17/14 1750 01/18/14 0708  HGB 12.0 9.8*  HCT 34.8* 28.3*    Assessment/Plan: Breastfeeding and Lactation consult.  Routine care.     LOS: 1 day   BOVARD,Merie Wulf 01/18/2014, 9:15 AM

## 2014-01-18 NOTE — Lactation Note (Signed)
This note was copied from the chart of Chelsea Joanette Zylstra. Lactation Consultation Note  Patient Name: Chelsea Wise TKPTW'S Date: 01/18/2014 Reason for consult: Initial assessment of this primipara and her newborn, now 36 hours of age and already latching well several times and having first void and several stools.  Initial LATCH score=7 at delivery and most recent LATCH score also=7. Mom says she was shown hand expression technique.  LC  reviewed possible sleepy behavior persisting since baby is [redacted] weeks gestation but encouraged STS and cue feedings for now.  Mom is resting and MGM holding baby.  LC encouraged review of Baby and Me pp 9, 14 and 20-25 for STS and BF information. LC provided [employer] brochure and reviewed [facility] services and list of community and web site resources.    Maternal Data Formula Feeding for Exclusion: No Infant to breast within first hour of birth: Yes (initial LATCH score=7) Has patient been taught Hand Expression?: Yes (mom reports being shown by her nurse) Does the patient have breastfeeding experience prior to this delivery?: No  Feeding Feeding Type: Breast Fed Length of feed: 10 min  LATCH Score/Interventions Latch: Grasps breast easily, tongue down, lips flanged, rhythmical sucking. Intervention(s): Assist with latch  Audible Swallowing: A few with stimulation  Type of Nipple: Everted at rest and after stimulation  Comfort (Breast/Nipple): Soft / non-tender     Hold (Positioning): Full assist, staff holds infant at breast  LATCH Score: 7  Lactation Tools Discussed/Used   STS, hand expression cue feedings Possible sleepy/poor feeding due to baby's late preterm status  Consult Status Consult Status: Follow-up Date: 01/19/14 Follow-up type: In-patient    Junious Dresser [facility] 01/18/2014, 6:39 PM

## 2014-01-19 MED ORDER — IBUPROFEN 600 MG PO TABS: 600.0000 mg | ORAL_TABLET | Freq: Four times a day (QID) | ORAL | Status: DC

## 2014-01-19 NOTE — Discharge Instructions (Signed)
As per discharge pamphlet °

## 2014-01-19 NOTE — Discharge Summary (Signed)
Obstetric Discharge Summary Reason for Admission: induction of labor and vaginal bleeding Prenatal Procedures: none Intrapartum Procedures: spontaneous vaginal delivery Postpartum Procedures: none Complications-Operative and Postpartum: 1st degree perineal laceration Hemoglobin  Date Value Ref Range Status  01/18/2014 9.8* 12.0 - 15.0 g/dL Final     REPEATED TO VERIFY     DELTA CHECK NOTED  07/16/2013 13.2   Final     HCT  Date Value Ref Range Status  01/18/2014 28.3* 36.0 - 46.0 % Final  07/16/2013 38   Final    Physical Exam:  General: alert Lochia: appropriate Uterine Fundus: firm  Discharge Diagnoses: Term Pregnancy-delivered and 3rd trimester bleeding  Discharge Information: Date: 01/19/2014 Activity: pelvic rest Diet: routine Medications: Ibuprofen Condition: stable Instructions: refer to practice specific booklet Discharge to: home Follow-up Information   Follow up with BOVARD,JODY, MD. Schedule an appointment as soon as possible for a visit in 6 weeks. (postpartum exam)    Specialty:  Obstetrics and Gynecology   Contact information:   [address] [address] [phone]       Newborn Data: Live born female  Birth Weight: 6 lb 13.4 oz (3100 g) APGAR: 8, 9  Home with mother.  Emori Kamau D 01/19/2014, 8:31 AM

## 2014-01-19 NOTE — Progress Notes (Signed)
PPD #1 No problems, wants to go home Afeb, VSS Fundus firm, NT at U-1 Continue routine postpartum care, d/c home if ok baby ok to go

## 2014-01-20 ENCOUNTER — Encounter (HOSPITAL_COMMUNITY)
Admission: RE | Admit: 2014-01-20 | Discharge: 2014-01-20 | Disposition: A | Discharge status: Home / Self Care | Payer: BC Managed Care – PPO | Source: Ambulatory Visit | Attending: Obstetrics and Gynecology | Admitting: Obstetrics and Gynecology

## 2014-01-20 DIAGNOSIS — O923 Agalactia: Secondary | ICD-10-CM | POA: Insufficient documentation

## 2014-01-26 ENCOUNTER — Ambulatory Visit: Payer: Self-pay

## 2014-01-26 NOTE — Lactation Note (Signed)
This note was copied from the chart of Chelsea Wise. Infant Lactation Consultation Outpatient Visit Note  Patient Name: Chelsea Wise Date of Birth: 01/18/2015 Birth Weight:  6 lb 13.4 oz (3100 g) Gestational Age at Delivery: Gestational Age: 724w0d Type of Delivery: Vaginal delivery  BW - 6-13 oz  D/c weight - 6-3 oz 1st Dr. Visit - 2/19 - 6-5 oz Smart start today - 6-11 oz Today's weight at consult - 6-10.9 oz 3030 g  Reason for F/U - use of Nipple shield in the hospital , 9% weight loss, [redacted] week gestation   Breastfeeding History Frequency of Breastfeeding: Per mom breast and bottle  , sometimes with a nipple shield and sometimes not. Length of Feeding: per mom when at breast seems to be easier without the nipple shield because it is hard to  keep on and use the syringe. Latches without but gets sleepy. Per mom I haven't slept since the middle of the  night and I'm very tired.  Voids: >5 , also a soaking wet diaper at consult  Stools: 3  Yellowish  brown stools with seeds   Supplementing / Method: Had rented a DEBP Medela from the hospital and brought it back today ,  Also  has obtained a DEBP from the hospital. Pumping:  Type of Pump: DEBP Medela    Frequency: X2 in the last 24 hours for 20 mins   Volume:  Mom did not say   Comments: LC encouraged mom to keep up pumping to establish and protect milk supply.    Consultation Evaluation: Both breast full , no engorgement or plugged ducts noted. Nipples both appear healthy, no breakdown. Mom denies engorgement or soreness. Noted both nipples to have a short shaft and compressible areolas. Baby has a high palate. Baby was sleeping when consult 1st started . Per mom had fed at 3 pm -5 mins at the breast  and 30 ml from a bottle. Baby rooting , but seems fussy .   Initial Feeding Assessment: Pre-feed Weight:6-10.9 oz 3030 g  Post-feed Weight: 6-11.0 oz 3034g  Amount Transferred: 4 ml  Comments: LC had mom latch baby on the left  breast skin to skin  without the nipple shield. Latches  With depth but doesn't sustain the latch for 5 mins or so and then becomes on and off pattern. Relatched with #24 NS with depth and noted multiply swallows. Baby started hanging out so LC had mom release  Suction. Baby fussy on and off   Additional Feeding Assessment: Pre-feed Weight:6-11.0 oz 3034g  Post-feed Weight: 6-11.0 oz 3034 g  Amount Transferred: zero transfer of milk  Comments: relatched with smaller NS #20 , noted swallows , and milk in the nipple shield, No milk transferred enough ti tip the scale. Baby fussy on and off   Additional Feeding Assessment:changed large wet  Pre-feed Weight: 6-10.3 oz 3014 g  Post-feed Weight: 6-10.4 oz 3018 g  Amount Transferred: 4 ml  Comments: right breast with nipple shield #20 NS , more consistent pattern with swallows, , baby became frustrated and fussy at the breast  Ended up taking her off and supplementing with a medium based Dr. Manson PasseyBrown Nipple   Total Breast milk Transferred this Visit: 8 ml  Total Supplement Given: 28 ml of formula   Comments- mom voiced feeling of frustrations with the breast feeding and all the work it entails. Dad mentioned twice during the consult  Asking what type of formula they should be using if they  go to all formula. [facility] 's discussed holding off on a decision until mom was rested  So she wouldn't have regrets. Also mentioned there baby is a [redacted] week gestation and the feeding behaviors of the gestational age. See the Lactation plan for details and options presented to parents.   Lactation Plan of Care - Praised mom for her efforts breast feeding and pumping                                          - Encouraged rest and naps , plenty fluids , water , nutritious snacks and meals                                          - Feedings- every 2 1/2 - 3hours                                          - Option #1 - Feed @ the breast - ( 1 breast for 15 -20 mins with a  nipple shield and supplement after with 30 ml or greater                                                                 Post pump for 10 -15 mins , Next feeding switch breast and do the same                                          - Using a nipple shield - pumping recommended post pump after 4-6 feedings a day to protect milk supply                                          - Option #2 - Pump and bottle feed every 2 1/2 -3 hours ( 8-10 X's a day )                                              Follow-Up- per mom visit at the [facility] tomorrow - Dr. Marcello Moores ( [place] )                    - LC offered a F/U visit and parents were undecided - decided to wait and call                    - LC mentioned if using a nipple shield weekly weight checks are indicated                    - LC encouraged mom to call [facility] office  Myer Haff 01/26/2014, 5:34 PM

## 2014-10-05 ENCOUNTER — Encounter (HOSPITAL_COMMUNITY): Payer: Self-pay | Admitting: Obstetrics and Gynecology

## 2015-08-31 LAB — OB RESULTS CONSOLE HEPATITIS B SURFACE ANTIGEN: Hepatitis B Surface Ag: NEGATIVE

## 2015-08-31 LAB — OB RESULTS CONSOLE HIV ANTIBODY (ROUTINE TESTING): HIV: NONREACTIVE

## 2015-08-31 LAB — OB RESULTS CONSOLE ANTIBODY SCREEN: Antibody Screen: NEGATIVE

## 2015-08-31 LAB — OB RESULTS CONSOLE GC/CHLAMYDIA
Chlamydia: NEGATIVE
GC PROBE AMP, GENITAL: NEGATIVE

## 2015-08-31 LAB — OB RESULTS CONSOLE ABO/RH: RH Type: POSITIVE

## 2015-08-31 LAB — OB RESULTS CONSOLE RPR: RPR: NONREACTIVE

## 2015-08-31 LAB — OB RESULTS CONSOLE RUBELLA ANTIBODY, IGM: Rubella: IMMUNE

## 2015-12-05 NOTE — L&D Delivery Note (Signed)
Pt complete and at +2 station.  Pt pushed for about 10-15 mins to deliver a viable female infant in LOA position over intact perineum. Nuchal x 1 reduced over infants head. Anterior and posterior shoulders spontaneously delivered next with body easily following. Infant was placed on mothers abdomen and bulb suction of mouth and nose performed. Cord was then clamped and cut by FOB after a minute delay. Cord blood was obtained. Baby had a vigorous spontaneous cry noted. Placenta then delivered about 5 mins later intact, 3VC shultz. Fundal massage performed and pitocin per protocol. Fundus firm. Vaginal inspection confirmed slight hemostatic abrasion at perineum and small periurethral laceration that was also hemostatic - no repair needed. Mother and baby stable. Counts correct. Apgars of 9 and 9

## 2016-03-15 ENCOUNTER — Encounter (HOSPITAL_COMMUNITY): Payer: Self-pay | Admitting: *Deleted

## 2016-03-15 ENCOUNTER — Inpatient Hospital Stay (HOSPITAL_COMMUNITY)
Admission: AD | Admit: 2016-03-15 | Discharge: 2016-03-17 | DRG: 775 | Disposition: A | Discharge status: Home / Self Care | Payer: Commercial Managed Care - HMO | Source: Ambulatory Visit | Attending: Obstetrics and Gynecology | Admitting: Obstetrics and Gynecology

## 2016-03-15 DIAGNOSIS — Z823 Family history of stroke: Secondary | ICD-10-CM

## 2016-03-15 DIAGNOSIS — Z833 Family history of diabetes mellitus: Secondary | ICD-10-CM

## 2016-03-15 DIAGNOSIS — IMO0001 Reserved for inherently not codable concepts without codable children: Secondary | ICD-10-CM

## 2016-03-15 DIAGNOSIS — Z8249 Family history of ischemic heart disease and other diseases of the circulatory system: Secondary | ICD-10-CM

## 2016-03-15 DIAGNOSIS — Z3A37 37 weeks gestation of pregnancy: Secondary | ICD-10-CM

## 2016-03-15 LAB — TYPE AND SCREEN
ABO/RH(D): O POS
Antibody Screen: NEGATIVE

## 2016-03-15 LAB — CBC
HEMATOCRIT: 38.8 % (ref 36.0–46.0)
HEMOGLOBIN: 13.4 g/dL (ref 12.0–15.0)
MCH: 30 pg (ref 26.0–34.0)
MCHC: 34.5 g/dL (ref 30.0–36.0)
MCV: 86.8 fL (ref 78.0–100.0)
Platelets: 222 10*3/uL (ref 150–400)
RBC: 4.47 MIL/uL (ref 3.87–5.11)
RDW: 13.9 % (ref 11.5–15.5)
WBC: 11.6 10*3/uL — ABNORMAL HIGH (ref 4.0–10.5)

## 2016-03-15 LAB — GROUP B STREP BY PCR: Group B strep by PCR: POSITIVE — AB

## 2016-03-15 LAB — RPR: RPR: NONREACTIVE

## 2016-03-15 MED ORDER — OXYCODONE-ACETAMINOPHEN 5-325 MG PO TABS: 2.0000 | ORAL_TABLET | ORAL | Status: DC | PRN

## 2016-03-15 MED ORDER — DIPHENHYDRAMINE HCL 25 MG PO CAPS: 25.0000 mg | ORAL_CAPSULE | Freq: Four times a day (QID) | ORAL | Status: DC | PRN

## 2016-03-15 MED ORDER — SIMETHICONE 80 MG PO CHEW
80.0000 mg | CHEWABLE_TABLET | ORAL | Status: DC | PRN
Start: 2016-03-15 — End: 2016-03-17

## 2016-03-15 MED ORDER — ONDANSETRON HCL 4 MG/2ML IJ SOLN
4.0000 mg | Freq: Four times a day (QID) | INTRAMUSCULAR | Status: DC | PRN
  Administered 2016-03-15: 4 mg via INTRAVENOUS
  Filled 2016-03-15: qty 2

## 2016-03-15 MED ORDER — TETANUS-DIPHTH-ACELL PERTUSSIS 5-2.5-18.5 LF-MCG/0.5 IM SUSP: 0.5000 mL | Freq: Once | INTRAMUSCULAR | Status: DC

## 2016-03-15 MED ORDER — PENICILLIN G POTASSIUM 5000000 UNITS IJ SOLR
5.0000 10*6.[IU] | Freq: Once | INTRAMUSCULAR | Status: AC
  Administered 2016-03-15: 5 10*6.[IU] via INTRAVENOUS
  Filled 2016-03-15: qty 5

## 2016-03-15 MED ORDER — BENZOCAINE-MENTHOL 20-0.5 % EX AERO
1.0000 "application " | INHALATION_SPRAY | CUTANEOUS | Status: DC | PRN
  Administered 2016-03-17: 1 via TOPICAL
  Filled 2016-03-15: qty 56

## 2016-03-15 MED ORDER — PRENATAL MULTIVITAMIN CH
1.0000 | ORAL_TABLET | Freq: Every day | ORAL | Status: DC
  Administered 2016-03-15 – 2016-03-17 (×3): 1 via ORAL
  Filled 2016-03-15 (×3): qty 1

## 2016-03-15 MED ORDER — PENICILLIN G POTASSIUM 5000000 UNITS IJ SOLR
2.5000 10*6.[IU] | INTRAVENOUS | Status: DC
  Filled 2016-03-15 (×2): qty 2.5

## 2016-03-15 MED ORDER — OXYTOCIN BOLUS FROM INFUSION
500.0000 mL | INTRAVENOUS | Status: DC
  Administered 2016-03-15: 500 mL via INTRAVENOUS

## 2016-03-15 MED ORDER — LANOLIN HYDROUS EX OINT: TOPICAL_OINTMENT | CUTANEOUS | Status: DC | PRN

## 2016-03-15 MED ORDER — SENNOSIDES-DOCUSATE SODIUM 8.6-50 MG PO TABS
2.0000 | ORAL_TABLET | ORAL | Status: DC
  Administered 2016-03-15 – 2016-03-16 (×2): 2 via ORAL
  Filled 2016-03-15 (×2): qty 2

## 2016-03-15 MED ORDER — LACTATED RINGERS IV SOLN: 500.0000 mL | INTRAVENOUS | Status: DC | PRN

## 2016-03-15 MED ORDER — ZOLPIDEM TARTRATE 5 MG PO TABS: 5.0000 mg | ORAL_TABLET | Freq: Every evening | ORAL | Status: DC | PRN

## 2016-03-15 MED ORDER — LACTATED RINGERS IV SOLN
2.5000 [IU]/h | INTRAVENOUS | Status: DC
  Filled 2016-03-15: qty 4

## 2016-03-15 MED ORDER — ACETAMINOPHEN 325 MG PO TABS
650.0000 mg | ORAL_TABLET | ORAL | Status: DC | PRN
  Administered 2016-03-15: 650 mg via ORAL
  Filled 2016-03-15: qty 2

## 2016-03-15 MED ORDER — ACETAMINOPHEN 325 MG PO TABS: 650.0000 mg | ORAL_TABLET | ORAL | Status: DC | PRN

## 2016-03-15 MED ORDER — OXYCODONE-ACETAMINOPHEN 5-325 MG PO TABS: 1.0000 | ORAL_TABLET | ORAL | Status: DC | PRN

## 2016-03-15 MED ORDER — ONDANSETRON HCL 4 MG PO TABS: 4.0000 mg | ORAL_TABLET | ORAL | Status: DC | PRN

## 2016-03-15 MED ORDER — ONDANSETRON HCL 4 MG/2ML IJ SOLN: 4.0000 mg | INTRAMUSCULAR | Status: DC | PRN

## 2016-03-15 MED ORDER — FLEET ENEMA 7-19 GM/118ML RE ENEM: 1.0000 | ENEMA | RECTAL | Status: DC | PRN

## 2016-03-15 MED ORDER — LIDOCAINE HCL (PF) 1 % IJ SOLN
30.0000 mL | INTRAMUSCULAR | Status: DC | PRN
  Filled 2016-03-15: qty 30

## 2016-03-15 MED ORDER — DIBUCAINE 1 % RE OINT
1.0000 | TOPICAL_OINTMENT | RECTAL | Status: DC | PRN
Start: 2016-03-15 — End: 2016-03-17

## 2016-03-15 MED ORDER — LACTATED RINGERS IV SOLN: INTRAVENOUS | Status: DC

## 2016-03-15 MED ORDER — IBUPROFEN 600 MG PO TABS
600.0000 mg | ORAL_TABLET | Freq: Four times a day (QID) | ORAL | Status: DC
  Administered 2016-03-15 – 2016-03-17 (×10): 600 mg via ORAL
  Filled 2016-03-15 (×10): qty 1

## 2016-03-15 MED ORDER — CITRIC ACID-SODIUM CITRATE 334-500 MG/5ML PO SOLN
30.0000 mL | ORAL | Status: DC | PRN
Start: 2016-03-15 — End: 2016-03-15

## 2016-03-15 MED ORDER — WITCH HAZEL-GLYCERIN EX PADS: 1.0000 "application " | MEDICATED_PAD | CUTANEOUS | Status: DC | PRN

## 2016-03-15 NOTE — Lactation Note (Signed)
This note was copied from a baby's chart. Lactation Consultation Note  Initial visit made.  Breastfeeding consultation services and support information given to patient.  Mom breastfed her first baby for 9 months.  Baby just had his bath and sleepy skin to skin.  Mom attempting to latch baby in cross cradle hold.  Instructed on hand expression and a few drops visible.  Baby not showing interest in feeding.  Good suck elicited on gloved finger.  Baby did latch with good breast compression needed but fell asleep after 5 minutes.  Encouraged mom to nuzzle skin to skin and watch for feeding cues.  Instructed to call for assist prn.  Patient Name: Chelsea Wise [id number] Date: 03/15/2016 Reason for consult: Initial assessment   Maternal Data    Feeding Feeding Type: Breast Fed Length of feed: 5 min  LATCH Score/Interventions Latch: Repeated attempts needed to sustain latch, nipple held in mouth throughout feeding, stimulation needed to elicit sucking reflex. Intervention(s): Adjust position;Assist with latch;Breast massage;Breast compression  Audible Swallowing: None Intervention(s): Skin to skin;Hand expression  Type of Nipple: Everted at rest and after stimulation  Comfort (Breast/Nipple): Soft / non-tender     Hold (Positioning): Assistance needed to correctly position infant at breast and maintain latch. Intervention(s): Breastfeeding basics reviewed;Support Pillows;Position options;Skin to skin  LATCH Score: 6  Lactation Tools Discussed/Used     Consult Status Consult Status: Follow-up Date: 03/16/16 Follow-up type: In-patient    Ave Filter 03/15/2016, 4:03 PM

## 2016-03-15 NOTE — H&P (Signed)
Chelsea Wise is a 35 y.o. female presenting for painful regular contractions. She was found to be 5cm dilated in MAU hence admitted for labor management. Pt spontaneously ruptured her membranes shortly afterwards - clear fluid noted. She had not had a GBS swab done in office yet hence a rapid swab was done; a dose of PCN was given while awaiting results. Pt had a benign prenatal course. She had a low risk panorama screen and nl msafp. She passed her glucose screen.   Maternal Medical History:  Reason for admission: Contractions.  Nausea.  Contractions: Onset was 6-12 hours ago.   Frequency: regular.   Perceived severity is strong.    Fetal activity: Perceived fetal activity is normal.   Last perceived fetal movement was within the past hour.    Prenatal complications: no prenatal complications Prenatal Complications - Diabetes: none.    OB History    Gravida Para Term Preterm AB TAB SAB Ectopic Multiple Living   3 1 1  1  1   1      Past Medical History  Diagnosis Date  . Medical history non-contributory   . Placenta previa   . SVD (spontaneous vaginal delivery) 01/18/2014   Past Surgical History  Procedure Laterality Date  . No past surgeries    . Wisdom tooth extraction     Family History: family history includes Cancer in her paternal aunt and paternal grandmother; Diabetes in her maternal aunt and maternal uncle; Hypertension in her father, maternal aunt, and maternal uncle; Stroke in her paternal grandfather. Social History:  reports that she has never smoked. She has never used smokeless tobacco. She reports that she does not drink alcohol or use illicit drugs.   Prenatal Transfer Tool  Maternal Diabetes: No Genetic Screening: Normal Maternal Ultrasounds/Referrals: Normal Fetal Ultrasounds or other Referrals:  None Maternal Substance Abuse:  No Significant Maternal Medications:  None Significant Maternal Lab Results:  Lab values include: Other: GBS unknown (  rapid swab positive) Other Comments:  None  Review of Systems  Constitutional: Negative for fever, chills, weight loss and malaise/fatigue.  Eyes: Negative for blurred vision.  Respiratory: Negative for shortness of breath.   Cardiovascular: Negative for chest pain.  Gastrointestinal: Positive for abdominal pain. Negative for heartburn, nausea and vomiting.  Genitourinary: Negative for dysuria.  Musculoskeletal: Positive for back pain.  Skin: Negative for rash.  Neurological: Negative for dizziness and headaches.  Psychiatric/Behavioral: Negative for depression. The patient is not nervous/anxious.     Dilation: 10 Effacement (%): 100 Station: +1 Exam by:: E. Wynetta Emery, RN Blood pressure 138/92, pulse 77, temperature 98.6 F (37 C), temperature source Oral, resp. rate 20, height 5\' 2"  (1.575 m), weight 156 lb (70.761 kg), SpO2 98 %, unknown if currently breastfeeding. Maternal Exam:  Uterine Assessment: Contraction strength is firm.  Contraction frequency is regular.   Abdomen: Patient reports generalized tenderness.  Estimated fetal weight is AGA.   Fetal presentation: vertex  Introitus: Normal vulva. Normal vagina.  Pelvis: adequate for delivery.   Cervix: Cervix evaluated by digital exam.     Physical Exam  Constitutional: She is oriented to person, place, and time. She appears well-developed and well-nourished.  HENT:  Head: Normocephalic.  Neck: Normal range of motion.  Respiratory: Effort normal.  GI: Soft. There is generalized tenderness.  Genitourinary: Vagina normal and uterus normal.  Musculoskeletal: Normal range of motion.  Neurological: She is alert and oriented to person, place, and time.  Skin: Skin is warm.  Psychiatric: She  has a normal mood and affect. Her behavior is normal. Judgment and thought content normal.    Prenatal labs: ABO, Rh: --/--/O POS (04/12 0225) Antibody: NEG (04/12 0225) Rubella: Immune (09/27 0000) RPR: Nonreactive (09/27 0000)   HBsAg: Negative (09/27 0000)  HIV: Non-reactive (09/27 0000)  GBS:     Assessment/Plan: 35yo G3P1011 at 47 2/[redacted]wks gestation in active labor Does not desire epidural  PCN for GBS prophylaxis Expectant management   Chelsea Wise 03/15/2016, 4:26 AM

## 2016-03-15 NOTE — MAU Note (Signed)
Pt reports some bright red bleeding off/on since last pm, some contractions.

## 2016-03-16 LAB — CBC
HCT: 34.4 % — ABNORMAL LOW (ref 36.0–46.0)
Hemoglobin: 11.8 g/dL — ABNORMAL LOW (ref 12.0–15.0)
MCH: 29.9 pg (ref 26.0–34.0)
MCHC: 34.3 g/dL (ref 30.0–36.0)
MCV: 87.3 fL (ref 78.0–100.0)
Platelets: 202 10*3/uL (ref 150–400)
RBC: 3.94 MIL/uL (ref 3.87–5.11)
RDW: 14.1 % (ref 11.5–15.5)
WBC: 12.6 10*3/uL — ABNORMAL HIGH (ref 4.0–10.5)

## 2016-03-16 NOTE — Progress Notes (Signed)
Post Partum Day 1 Subjective: no complaints, up ad lib, voiding, tolerating PO and bonding well with baby  Objective: Blood pressure 121/79, pulse 82, temperature 97.9 F (36.6 C), temperature source Oral, resp. rate 18, height 5\' 2"  (1.575 m), weight 156 lb (70.761 kg), SpO2 98 %, unknown if currently breastfeeding.  Physical Exam:  General: alert, cooperative and no distress Lochia: appropriate Uterine Fundus: firm Incision: n/a DVT Evaluation: No evidence of DVT seen on physical exam. Negative Homan's sign.   Recent Labs  03/15/16 0225 03/16/16 0523  HGB 13.4 11.8*  HCT 38.8 34.4*    Assessment/Plan: Plan for discharge tomorrow, Breastfeeding and Circumcision prior to discharge  Doing well Routine pp care   LOS: 1 day   Sherlyn Hay 03/16/2016, 1:38 PM

## 2016-03-17 MED ORDER — IBUPROFEN 600 MG PO TABS: 600.0000 mg | ORAL_TABLET | Freq: Four times a day (QID) | ORAL | Status: DC

## 2016-03-17 NOTE — Discharge Summary (Signed)
OB Discharge Summary     Patient Name: Chelsea Wise DOB: [birth year] MRN: [id number]  Date of admission: 03/15/2016 Delivering MD: Carlynn Purl [facility]   Date of discharge: 03/17/2016  Admitting diagnosis: 64 WEEKS BLEEDING CTX Intrauterine pregnancy: [redacted]w[redacted]d     Secondary diagnosis:  Active Problems:   Active labor   Postpartum care following vaginal delivery  Additional problems: none     Discharge diagnosis: Term Pregnancy Delivered                                                                                                Post partum procedures:none  Augmentation: n/a  Complications: None  Hospital course:  Onset of Labor With Vaginal Delivery     35 y.o. yo [id number] at [redacted]w[redacted]d was admitted in Active Labor on 03/15/2016. Patient had an uncomplicated labor course as follows:  Membrane Rupture Time/Date: 3:19 AM ,03/15/2016   Intrapartum Procedures: Episiotomy: None [1]                                         Lacerations:  None [1]  Patient had a delivery of a Viable infant. 03/15/2016  Information for the patient's newborn:  Claudie, Pajares [id number]  Delivery Method: Vaginal, Spontaneous Delivery (Filed from Delivery Summary)    Pateint had an uncomplicated postpartum course.  She is ambulating, tolerating a regular diet, passing flatus, and urinating well. Patient is discharged home in stable condition on 03/17/2016.    Physical exam  Filed Vitals:   03/15/16 1730 03/16/16 0608 03/16/16 1735 03/17/16 0600  BP: 115/74 121/79 121/64 118/78  Pulse: 95 82 73 78  Temp: 98.4 F (36.9 C) 97.9 F (36.6 C) 98.2 F (36.8 C) 97.9 F (36.6 C)  TempSrc: Oral Oral Oral Oral  Resp: 20 18 18 20   Height:      Weight:      SpO2:  98%  100%   General: alert and cooperative Lochia: appropriate Uterine Fundus: firm  Labs: Lab Results  Component Value Date   WBC 12.6* 03/16/2016   HGB 11.8* 03/16/2016   HCT 34.4* 03/16/2016   MCV 87.3 03/16/2016   PLT 202  03/16/2016   CMP Latest Ref Rng 03/20/2013  Glucose 70 - 99 mg/dL 84  BUN 6 - 23 mg/dL 7  Creatinine 0.50 - 1.10 mg/dL 0.78  Sodium 135 - 145 mEq/L 136  Potassium 3.5 - 5.1 mEq/L 3.8  Chloride 96 - 112 mEq/L 102  CO2 19 - 32 mEq/L 25  Calcium 8.4 - 10.5 mg/dL 9.1    Discharge instruction: per After Visit Summary and "Baby and Me Booklet".  After visit meds:    Medication List    TAKE these medications        ibuprofen 600 MG tablet  Commonly known as:  ADVIL,MOTRIN  Take 1 tablet (600 mg total) by mouth every 6 (six) hours.     PRENATAL DHA PO  Take 1 tablet by mouth daily.     TYLENOL  PO  Take 1 tablet by mouth daily as needed (headache).        Diet: routine diet  Activity: Advance as tolerated. Pelvic rest for 6 weeks.   Outpatient follow up:6 weeks Follow up Appt:No future appointments. Follow up Visit:No Follow-up on file.  Postpartum contraception: Progesterone only pills  Newborn Data: Live born female  Birth Weight: 6 lb 13.7 oz (3110 g) APGAR: 9, 9  Baby Feeding: Breast Disposition:home with mother   03/17/2016 Logan Bores, MD

## 2016-03-17 NOTE — Lactation Note (Signed)
This note was copied from a baby's chart. Lactation Consultation Note  Patient Name: Chelsea Wise [id number] Date: 03/17/2016 Reason for consult: Follow-up assessment;Other (Comment);Infant weight loss (6% weight loss, 45 hours - Bili - 10.5 , F/U per mom Monday [place] )  LC was asked by [facility] RN to check baby's mouth for possible short frenulum.  Per mom feeling tenderness when baby initially latches and some during the feedings. LC assessed baby's oral cavity with a gloved finger, short labial frenulum above gum line and upper lip stretches well with exam, and at the latch LC had to flip upper lip to flanged position. Anterior short frenulum noted, no problem with posterior portion of tongue. Palate normal level. When baby was latched , eased chin and flipped upper lip . Per mom latch was more comfortable when LC assisted with depth. Multiply swallows noted, increased with breast compressions. Breast feel fuller , heavier and warmer. Prior to latch - LC reviewed hand expressing with steady flow . Also noted some areola edema, positional strips. No breakdown , to be preventive - LC recommended prior to every latch - breast massage, hand express, pre- pump if needed and reverse pressure , so the baby's restricted tongue movement can be assisted. Breast compressions with latch until swallows and then intermittent.  LC instructed mom on the use shells , hand pump, comfort gels.  LC recommended when she has her F/U Pedis visit this coming Monday to have the Pedis MD check the baby's tongue movement.  Tongue -Tie resource given to mom. LC encouraged mom to call [place] [facility] office if sore nipples don't improve, or challenges with latching occur.  Mother informed of post-discharge support and given phone number to the lactation department, including services for phone call assistance; out-patient appointments; and breastfeeding support group. List of other breastfeeding resources in the community given in the  handout. Encouraged mother to call for problems or concerns related to breastfeeding.   Maternal Data Has patient been taught Hand Expression?: Yes  Feeding ( LC observed this latch , and assisted to ease chin down and flip upper lip to flanged position.  Per mom comfortable with latch. - swallows noted) .  Feeding Type: Breast Fed Length of feed: 10 min  LATCH Score/Interventions Latch: Grasps breast easily, tongue down, lips flanged, rhythmical sucking. Intervention(s): Adjust position;Assist with latch;Breast massage;Breast compression  Audible Swallowing: Spontaneous and intermittent  Type of Nipple: Everted at rest and after stimulation  Comfort (Breast/Nipple): Soft / non-tender     Hold (Positioning): Assistance needed to correctly position infant at breast and maintain latch. Intervention(s): Breastfeeding basics reviewed;Support Pillows;Position options;Skin to skin  LATCH Score: 9  Lactation Tools Discussed/Used Tools: Shells;Pump;Comfort gels;Flanges Flange Size: 27 (per mom increased to #27 with her 1st baby ) Shell Type: Inverted Breast pump type: Manual Pump Review: Setup, frequency, and cleaning;Milk Storage Initiated by:: MAI - LC reviewed hand pump  Date initiated:: 03/17/16   Consult Status Consult Status: Complete Date: 03/17/16    Myer Haff 03/17/2016, 12:45 PM

## 2016-03-17 NOTE — Progress Notes (Signed)
Post Partum Day 2 Subjective: no complaints and tolerating PO  Objective: Blood pressure 118/78, pulse 78, temperature 97.9 F (36.6 C), temperature source Oral, resp. rate 20, height 5\' 2"  (1.575 m), weight 70.761 kg (156 lb), SpO2 100 %, unknown if currently breastfeeding.  Physical Exam:  General: alert and cooperative Lochia: appropriate Uterine Fundus: firm    Recent Labs  03/15/16 0225 03/16/16 0523  HGB 13.4 11.8*  HCT 38.8 34.4*    Assessment/Plan: Discharge home   LOS: 2 days   Ziggy Chanthavong W 03/17/2016, 9:38 AM

## 2016-03-17 NOTE — Progress Notes (Signed)
UR chart review completed.  

## 2020-12-06 ENCOUNTER — Ambulatory Visit
Admission: EM | Admit: 2020-12-06 | Discharge: 2020-12-06 | Disposition: A | Discharge status: Home / Self Care | Payer: BLUE CROSS/BLUE SHIELD | Attending: Emergency Medicine | Admitting: Emergency Medicine

## 2020-12-06 DIAGNOSIS — M546 Pain in thoracic spine: Secondary | ICD-10-CM

## 2020-12-06 MED ORDER — TIZANIDINE HCL 4 MG PO TABS: 4.0000 mg | ORAL_TABLET | Freq: Four times a day (QID) | ORAL | 0 refills | Status: DC | PRN

## 2020-12-06 MED ORDER — NAPROXEN 500 MG PO TABS: 500.0000 mg | ORAL_TABLET | Freq: Two times a day (BID) | ORAL | 0 refills | Status: DC

## 2020-12-06 NOTE — ED Triage Notes (Signed)
Patient presents to Urgent Care with complaints of neck pain that radiates to mid back region x 2-3 days. Pt reports a fall last year and since has these episodes. Treating pain with tylenol last dose 2045 last night.

## 2020-12-06 NOTE — Discharge Instructions (Signed)
Naprosyn twice daily with food for pain Supple with tizanidine at home/bedtime, this is a muscle relaxer-may cause drowsiness Gentle stretching-see attached Alternate ice and heat Follow-up if not improving or worsening

## 2020-12-06 NOTE — ED Provider Notes (Signed)
[facility]    CSN: [id number] Arrival date & time: 12/06/20  [id number]      History   Chief Complaint Chief Complaint  Patient presents with  . Neck Pain  . Back Pain    HPI Chelsea Wise is a 40 y.o. female presenting today for evaluation of neck pain and back pain.  Reports pain from the neck radiating to mid back.  Symptoms for 2 to 3 days.  Reports recurrent similar episodes since a fall last year.  Using Tylenol.  HPI  Past Medical History:  Diagnosis Date  . Medical history non-contributory   . Placenta previa   . SVD (spontaneous vaginal delivery) 01/18/2014    Patient Active Problem List   Diagnosis Date Noted  . Active labor 03/15/2016  . Postpartum care following vaginal delivery 03/15/2016  . SVD (spontaneous vaginal delivery) 01/18/2014  . Labor and delivery, indication for care 01/17/2014  . Antepartum bleeding, third trimester 01/01/2014  . Placenta previa in second trimester 11/28/2013    Past Surgical History:  Procedure Laterality Date  . NO PAST SURGERIES    . WISDOM TOOTH EXTRACTION      OB History    Gravida  3   Para  2   Term  2   Preterm      AB  1   Living  2     SAB  1   IAB      Ectopic      Multiple  0   Live Births  2            Home Medications    Prior to Admission medications   Medication Sig Start Date End Date Taking? Authorizing Provider  naproxen (NAPROSYN) 500 MG tablet Take 1 tablet (500 mg total) by mouth 2 (two) times daily. 12/06/20  Yes Sante Biedermann C, PA-C  tiZANidine (ZANAFLEX) 4 MG tablet Take 1 tablet (4 mg total) by mouth every 6 (six) hours as needed for muscle spasms. 12/06/20  Yes Christne Platts C, PA-C  Acetaminophen (TYLENOL PO) Take 1 tablet by mouth daily as needed (headache).    [provider]  Docosahexaenoic Acid (PRENATAL DHA PO) Take 1 tablet by mouth daily.    [provider]  ibuprofen (ADVIL,MOTRIN) 600 MG tablet Take 1 tablet (600 mg total)  by mouth every 6 (six) hours. 03/17/16   Paula Compton, MD    Family History Family History  Problem Relation Age of Onset  . Hypertension Father   . Diabetes Maternal Aunt   . Hypertension Maternal Aunt   . Diabetes Maternal Uncle   . Hypertension Maternal Uncle   . Cancer Paternal Aunt        lung  . Cancer Paternal Grandmother        pancreas  . Stroke Paternal Grandfather     Social History Social History   Tobacco Use  . Smoking status: Never Smoker  . Smokeless tobacco: Never Used  Substance Use Topics  . Alcohol use: No    Comment: social  . Drug use: No     Allergies   Patient has no known allergies.   Review of Systems Review of Systems  Constitutional: Negative for fatigue and fever.  Eyes: Negative for visual disturbance.  Respiratory: Negative for shortness of breath.   Cardiovascular: Negative for chest pain.  Gastrointestinal: Negative for abdominal pain, nausea and vomiting.  Genitourinary: Negative for hematuria.  Musculoskeletal: Positive for back pain, myalgias and neck pain.  Negative for arthralgias and joint swelling.  Skin: Negative for color change, rash and wound.  Neurological: Negative for dizziness, weakness, light-headedness and headaches.     Physical Exam Triage Vital Signs ED Triage Vitals  Enc Vitals Group     BP      Pulse      Resp      Temp      Temp src      SpO2      Weight      Height      Head Circumference      Peak Flow      Pain Score      Pain Loc      Pain Edu?      Excl. in GC?    No data found.  Updated Vital Signs BP (S) (!) 141/94 (BP Location: Right Arm)   Pulse 74   Temp 99 F (37.2 C) (Oral)   Resp 16   Wt 137 lb (62.1 kg)   LMP 11/19/2020   SpO2 99%   BMI 25.06 kg/m   Visual Acuity Right Eye Distance:   Left Eye Distance:   Bilateral Distance:    Right Eye Near:   Left Eye Near:    Bilateral Near:     Physical Exam Vitals and nursing note reviewed.  Constitutional:       Appearance: She is well-developed and well-nourished.     Comments: No acute distress  HENT:     Head: Normocephalic and atraumatic.     Nose: Nose normal.  Eyes:     Conjunctiva/sclera: Conjunctivae normal.  Cardiovascular:     Rate and Rhythm: Normal rate and regular rhythm.  Pulmonary:     Effort: Pulmonary effort is normal. No respiratory distress.  Abdominal:     General: There is no distension.  Musculoskeletal:        General: Normal range of motion.     Cervical back: Neck supple.     Comments: Nontender to palpation of cervical and thoracic spine midline, tenderness throughout right superior trapezius and periscapular musculature of thoracic spine on right, full active range of motion of right shoulder, radial pulse 2+, grip strength 5/5 bilaterally  Skin:    General: Skin is warm and dry.  Neurological:     Mental Status: She is alert and oriented to person, place, and time.  Psychiatric:        Mood and Affect: Mood and affect normal.      UC Treatments / Results  Labs (all labs ordered are listed, but only abnormal results are displayed) Labs Reviewed - No data to display  EKG   Radiology No results found.  Procedures Procedures (including critical care time)  Medications Ordered in UC Medications - No data to display  Initial Impression / Assessment and Plan / UC Course  I have reviewed the triage vital signs and the nursing notes.  Pertinent labs & imaging results that were available during my care of the patient were reviewed by me and considered in my medical decision making (see chart for details).    Right thoracic muscle strain- Naprosyn twice daily for pain, tizanidine to supplement, gentle stretching, monitor for gradual resolution. Activity modification.  Discussed strict return precautions. Patient verbalized understanding and is agreeable with plan.  Final Clinical Impressions(s) / UC Diagnoses   Final diagnoses:  Acute right-sided  thoracic back pain     Discharge Instructions     Naprosyn twice daily with food  for pain Supple with tizanidine at home/bedtime, this is a muscle relaxer-may cause drowsiness Gentle stretching-see attached Alternate ice and heat Follow-up if not improving or worsening    ED Prescriptions    Medication Sig Dispense Auth. Provider   naproxen (NAPROSYN) 500 MG tablet Take 1 tablet (500 mg total) by mouth 2 (two) times daily. 30 tablet Clif Serio C, PA-C   tiZANidine (ZANAFLEX) 4 MG tablet Take 1 tablet (4 mg total) by mouth every 6 (six) hours as needed for muscle spasms. 30 tablet Zineb Glade, Shirley C, PA-C     PDMP not reviewed this encounter.   Janith Lima, [place] 12/06/20 [phone]

## 2021-02-23 ENCOUNTER — Other Ambulatory Visit: Payer: Self-pay | Admitting: Obstetrics and Gynecology

## 2021-02-23 DIAGNOSIS — N6310 Unspecified lump in the right breast, unspecified quadrant: Secondary | ICD-10-CM

## 2021-04-06 ENCOUNTER — Other Ambulatory Visit: Payer: Self-pay | Admitting: Obstetrics and Gynecology

## 2021-04-06 ENCOUNTER — Other Ambulatory Visit: Payer: Self-pay

## 2021-04-06 ENCOUNTER — Ambulatory Visit
Admission: RE | Admit: 2021-04-06 | Discharge: 2021-04-06 | Disposition: A | Discharge status: Home / Self Care | Payer: BLUE CROSS/BLUE SHIELD | Source: Ambulatory Visit | Attending: Obstetrics and Gynecology | Admitting: Obstetrics and Gynecology

## 2021-04-06 DIAGNOSIS — N6310 Unspecified lump in the right breast, unspecified quadrant: Secondary | ICD-10-CM

## 2021-04-08 ENCOUNTER — Ambulatory Visit
Admission: RE | Admit: 2021-04-08 | Discharge: 2021-04-08 | Disposition: A | Discharge status: Home / Self Care | Payer: BLUE CROSS/BLUE SHIELD | Source: Ambulatory Visit | Attending: Obstetrics and Gynecology | Admitting: Obstetrics and Gynecology

## 2021-04-08 ENCOUNTER — Other Ambulatory Visit: Payer: Self-pay

## 2021-04-08 ENCOUNTER — Other Ambulatory Visit: Payer: Self-pay | Admitting: Obstetrics and Gynecology

## 2021-04-08 DIAGNOSIS — N6310 Unspecified lump in the right breast, unspecified quadrant: Secondary | ICD-10-CM

## 2021-04-08 DIAGNOSIS — Z170 Estrogen receptor positive status [ER+]: Secondary | ICD-10-CM

## 2021-04-08 DIAGNOSIS — C50211 Malignant neoplasm of upper-inner quadrant of right female breast: Secondary | ICD-10-CM

## 2021-04-08 HISTORY — DX: Estrogen receptor positive status (ER+): Z17.0

## 2021-04-08 HISTORY — DX: Malignant neoplasm of upper-inner quadrant of right female breast: C50.211

## 2021-04-12 ENCOUNTER — Telehealth: Payer: Self-pay | Admitting: *Deleted

## 2021-04-12 ENCOUNTER — Other Ambulatory Visit: Payer: Self-pay | Admitting: *Deleted

## 2021-04-12 DIAGNOSIS — C50211 Malignant neoplasm of upper-inner quadrant of right female breast: Secondary | ICD-10-CM

## 2021-04-12 NOTE — Telephone Encounter (Signed)
Spoke to pt regarding new pt appt on 5/11. Confirmed appt date and time to arrive at [facility] and contact information provided.

## 2021-04-12 NOTE — Progress Notes (Signed)
[place] CONSULT NOTE  Patient Care Team: Patient, [name] (Inactive) as PCP - General (General Practice) [name], RN as Oncology Nurse Navigator [name], RN as Oncology Nurse Navigator  CHIEF COMPLAINTS/PURPOSE OF CONSULTATION:  Newly diagnosed breast cancer  HISTORY OF PRESENTING ILLNESS:  Chelsea Wise 40 y.o. female is here because of recent diagnosis of invasive ductal carcinoma of the right breast. Patient palpated a lump in the upper right breast, no prior mammograms. Diagnostic mammogram and [place] on 04/06/21 showed a 4.5cm right breast mass at the 12-1 o'clock position with associated microcalcifications, multiple additional masses from the 9-11 o'clock position in the right breast, calcifications in the subareolar right breast, 1.4cm, and two enlarged right axillary lymph nodes. Biopsy on 04/08/21 showed invasive and in situ ductal carcinoma in the breast and axilla, grade 2. She presents to the clinic today for initial evaluation and discussion of treatment options.   I reviewed her records extensively and collaborated the history with the patient.  SUMMARY OF ONCOLOGIC HISTORY: Oncology History  Malignant neoplasm of upper-inner quadrant of right breast in female, estrogen receptor positive ([place])  04/08/2021 Initial Diagnosis   Palpable right breast mass: Mammogram and US showed a 4.5cm right breast mass at the 12-1 o'clock position with associated microcalcifications, multiple additional masses from the 9-11 o'clock position in the right breast, calcifications in the subareolar right breast, 1.4cm, and two enlarged right axillary lymph nodes. Biopsy showed invasive and in situ ductal carcinoma in the breast and axilla, grade 2 ER 80%, PR 1%, Ki-67 25%, HER2 equivalent by [facility]   04/08/2021 Cancer Staging   Staging form: Breast, AJCC 8th Edition - Clinical stage from 04/08/2021: Stage IIA (cT2, cN1, cM0, G2, ER+, PR+, HER2-) - Signed by Nicholas Lose,  MD on 04/13/2021 Stage prefix: Initial diagnosis Histologic grading system: 3 grade system     MEDICAL HISTORY:  Past Medical History:  Diagnosis Date  . Medical history non-contributory   . Placenta previa   . SVD (spontaneous vaginal delivery) 01/18/2014    SURGICAL HISTORY: Past Surgical History:  Procedure Laterality Date  . NO PAST SURGERIES    . WISDOM TOOTH EXTRACTION      SOCIAL HISTORY: Social History   Socioeconomic History  . Marital status: Married    Spouse name: Not on file  . Number of children: Not on file  . Years of education: Not on file  . Highest education level: Not on file  Occupational History  . Not on file  Tobacco Use  . Smoking status: Never Smoker  . Smokeless tobacco: Never Used  Substance and Sexual Activity  . Alcohol use: No    Comment: social  . Drug use: No  . Sexual activity: Yes    Partners: Male  Other Topics Concern  . Not on file  Social History Narrative  . Not on file   Social Determinants of Health   Financial Resource Strain: Not on file  Food Insecurity: Not on file  Transportation Needs: Not on file  Physical Activity: Not on file  Stress: Not on file  Social Connections: Not on file  Intimate Partner Violence: Not on file    FAMILY HISTORY: Family History  Problem Relation Age of Onset  . Hypertension Father   . Diabetes Maternal Aunt   . Hypertension Maternal Aunt   . Diabetes Maternal Uncle   . Hypertension Maternal Uncle   . Cancer Paternal Aunt  lung  . Cancer Paternal Grandmother        pancreas  . Stroke Paternal Grandfather   . Breast cancer Maternal Aunt   . Breast cancer Maternal Aunt     ALLERGIES:  has No Known Allergies.  MEDICATIONS:  Current Outpatient Medications  Medication Sig Dispense Refill  . Acetaminophen (TYLENOL PO) Take 1 tablet by mouth daily as needed (headache).    . Docosahexaenoic Acid (PRENATAL DHA PO) Take 1 tablet by mouth daily.    Marland Kitchen ibuprofen  (ADVIL,MOTRIN) 600 MG tablet Take 1 tablet (600 mg total) by mouth every 6 (six) hours. 30 tablet 0  . naproxen (NAPROSYN) 500 MG tablet Take 1 tablet (500 mg total) by mouth 2 (two) times daily. 30 tablet 0  . tiZANidine (ZANAFLEX) 4 MG tablet Take 1 tablet (4 mg total) by mouth every 6 (six) hours as needed for muscle spasms. 30 tablet 0   No current facility-administered medications for this visit.    REVIEW OF SYSTEMS:     Breast: Palpable right breast mass All other systems were reviewed with the patient and are negative.  PHYSICAL EXAMINATION: ECOG PERFORMANCE STATUS: 1 - Symptomatic but completely ambulatory  Vitals:   04/13/21 1133  BP: 136/82  Pulse: 61  Resp: 17  Temp: 98.8 F (37.1 C)  SpO2: 100%   Filed Weights   04/13/21 1133  Weight: 131 lb 8 oz (59.6 kg)       LABORATORY DATA:  I have reviewed the data as listed Lab Results  Component Value Date   WBC 5.7 04/13/2021   HGB 13.0 04/13/2021   HCT 37.5 04/13/2021   MCV 89.7 04/13/2021   PLT 290 04/13/2021   Lab Results  Component Value Date   NA 139 04/13/2021   K 3.6 04/13/2021   CL 103 04/13/2021   CO2 28 04/13/2021    RADIOGRAPHIC STUDIES: I have personally reviewed the radiological reports and agreed with the findings in the report.  ASSESSMENT AND PLAN:  Malignant neoplasm of upper-inner quadrant of right breast in female, estrogen receptor positive ([place]) 04/08/2021:Palpable right breast mass: Mammogram and US showed a 4.5cm right breast mass at the 12-1 o'clock position with associated microcalcifications, multiple additional masses from the 9-11 o'clock position in the right breast, calcifications in the subareolar right breast, 1.4cm, and two enlarged right axillary lymph nodes. Biopsy showed invasive and in situ ductal carcinoma in the breast and axilla, grade 2 ER 80%, PR 1%, Ki-67 25%, HER2 equivalent by IHC, FISH Pending  Pathology and radiology counseling: Discussed with the patient, the  details of pathology including the type of breast cancer,the clinical staging, the significance of ER, PR and HER-2/neu receptors and the implications for treatment. After reviewing the pathology in detail, we proceeded to discuss the different treatment options between surgery, radiation, chemotherapy, antiestrogen therapies.  Treatment plan: 1.  Breast MRI: If the number of axillary lymph nodes is 3 or less, we will send for MammaPrint testing to determine if she would need chemotherapy.  If she is high risk on MammaPrint or for more lymph nodes then she will need neoadjuvant chemo. 2. mastectomy with targeted node dissection 3.  Adjuvant radiation therapy 4.  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  Her husband works for a [employer].  All questions were answered. The patient knows to call the  clinic with any problems, questions or concerns.   Rulon Eisenmenger, MD, MPH 04/13/2021    I, Molly Dorshimer, am acting as scribe for Nicholas Lose, MD.  I have reviewed the above documentation for accuracy and completeness, and I agree with the above.

## 2021-04-13 ENCOUNTER — Other Ambulatory Visit: Payer: Self-pay

## 2021-04-13 ENCOUNTER — Encounter: Payer: Self-pay | Admitting: Physical Therapy

## 2021-04-13 ENCOUNTER — Encounter: Payer: Self-pay | Admitting: *Deleted

## 2021-04-13 ENCOUNTER — Inpatient Hospital Stay
Payer: Federal, State, Local not specified - PPO | Attending: Hematology and Oncology | Admitting: Hematology and Oncology

## 2021-04-13 ENCOUNTER — Encounter: Payer: Self-pay | Admitting: Genetic Counselor

## 2021-04-13 ENCOUNTER — Inpatient Hospital Stay: Payer: Federal, State, Local not specified - PPO

## 2021-04-13 ENCOUNTER — Ambulatory Visit: Payer: BLUE CROSS/BLUE SHIELD | Attending: Hematology and Oncology | Admitting: Physical Therapy

## 2021-04-13 ENCOUNTER — Inpatient Hospital Stay (HOSPITAL_BASED_OUTPATIENT_CLINIC_OR_DEPARTMENT_OTHER): Payer: Federal, State, Local not specified - PPO | Admitting: Genetic Counselor

## 2021-04-13 ENCOUNTER — Encounter: Payer: BLUE CROSS/BLUE SHIELD | Admitting: Genetic Counselor

## 2021-04-13 DIAGNOSIS — Z170 Estrogen receptor positive status [ER+]: Secondary | ICD-10-CM

## 2021-04-13 DIAGNOSIS — Z801 Family history of malignant neoplasm of trachea, bronchus and lung: Secondary | ICD-10-CM

## 2021-04-13 DIAGNOSIS — C773 Secondary and unspecified malignant neoplasm of axilla and upper limb lymph nodes: Secondary | ICD-10-CM | POA: Diagnosis present

## 2021-04-13 DIAGNOSIS — C50211 Malignant neoplasm of upper-inner quadrant of right female breast: Secondary | ICD-10-CM | POA: Insufficient documentation

## 2021-04-13 DIAGNOSIS — R293 Abnormal posture: Secondary | ICD-10-CM

## 2021-04-13 DIAGNOSIS — Z8042 Family history of malignant neoplasm of prostate: Secondary | ICD-10-CM | POA: Diagnosis not present

## 2021-04-13 DIAGNOSIS — Z800 Family history of malignant neoplasm of digestive organs: Secondary | ICD-10-CM | POA: Insufficient documentation

## 2021-04-13 DIAGNOSIS — Z803 Family history of malignant neoplasm of breast: Secondary | ICD-10-CM | POA: Diagnosis not present

## 2021-04-13 LAB — CBC WITH DIFFERENTIAL (CANCER CENTER ONLY)
Abs Immature Granulocytes: 0.01 10*3/uL (ref 0.00–0.07)
Basophils Absolute: 0 10*3/uL (ref 0.0–0.1)
Basophils Relative: 0 %
Eosinophils Absolute: 0.1 10*3/uL (ref 0.0–0.5)
Eosinophils Relative: 1 %
HCT: 37.5 % (ref 36.0–46.0)
Hemoglobin: 13 g/dL (ref 12.0–15.0)
Immature Granulocytes: 0 %
Lymphocytes Relative: 36 %
Lymphs Abs: 2.1 10*3/uL (ref 0.7–4.0)
MCH: 31.1 pg (ref 26.0–34.0)
MCHC: 34.7 g/dL (ref 30.0–36.0)
MCV: 89.7 fL (ref 80.0–100.0)
Monocytes Absolute: 0.4 10*3/uL (ref 0.1–1.0)
Monocytes Relative: 6 %
Neutro Abs: 3.2 10*3/uL (ref 1.7–7.7)
Neutrophils Relative %: 57 %
Platelet Count: 290 10*3/uL (ref 150–400)
RBC: 4.18 MIL/uL (ref 3.87–5.11)
RDW: 12.4 % (ref 11.5–15.5)
WBC Count: 5.7 10*3/uL (ref 4.0–10.5)
nRBC: 0 % (ref 0.0–0.2)

## 2021-04-13 LAB — CMP (CANCER CENTER ONLY)
ALT: 10 U/L (ref 0–44)
AST: 20 U/L (ref 15–41)
Albumin: 4.2 g/dL (ref 3.5–5.0)
Alkaline Phosphatase: 45 U/L (ref 38–126)
Anion gap: 8 (ref 5–15)
BUN: 9 mg/dL (ref 6–20)
CO2: 28 mmol/L (ref 22–32)
Calcium: 9.4 mg/dL (ref 8.9–10.3)
Chloride: 103 mmol/L (ref 98–111)
Creatinine: 0.77 mg/dL (ref 0.44–1.00)
GFR, Estimated: >60 mL/min (ref >60)
Glucose, Bld: 92 mg/dL (ref 70–99)
Potassium: 3.6 mmol/L (ref 3.5–5.1)
Sodium: 139 mmol/L (ref 135–145)
Total Bilirubin: 0.4 mg/dL (ref 0.3–1.2)
Total Protein: 7.5 g/dL (ref 6.5–8.1)

## 2021-04-13 LAB — GENETIC SCREENING ORDER

## 2021-04-13 NOTE — Therapy (Signed)
[place], [place], [address] Phone: [phone]   Fax:  [phone]  Physical Therapy Evaluation  Patient Details  Name: Chelsea Wise MRN: [id number] Date of Birth: Jun 01, 1981 Referring Provider (PT): Dr. Nicholas Lose   Encounter Date: 04/13/2021   PT End of Session - 04/13/21 1351    Visit Number 1    Number of Visits 2    Date for PT Re-Evaluation 10/14/21    PT Start Time 1224    PT Stop Time 1257    PT Time Calculation (min) 33 min    Activity Tolerance Patient tolerated treatment well    Behavior During Therapy [facility] for tasks assessed/performed           Past Medical History:  Diagnosis Date  . Medical history non-contributory   . Placenta previa   . SVD (spontaneous vaginal delivery) 01/18/2014    Past Surgical History:  Procedure Laterality Date  . NO PAST SURGERIES    . WISDOM TOOTH EXTRACTION      There were no vitals filed for this visit.    Subjective Assessment - 04/13/21 1308    Subjective Patient reports she is here today to meet with her medical team for her newly diagnosed right breast cancer.    Patient is accompained by: Family member    Pertinent History Patient was diagnosed on 04/11/2021 with right grade II invasive ductal carcinoma breast cancer. It measures 4.5 cm and is located in the upper inner quadrant. It is ER positive, weakly PR positive, and HER2 equivocal. Ki67 is 25%. She has 2 abnormal appearing axillary lymph nodes. One was biopsied and found to be positive.    Patient Stated Goals Reduce lymphedema risk and learn post op shoulder ROM HEP    Currently in Pain? No/denies   Chronic low back pain x7 years since epidural but nothing new             [facility] PT Assessment - 04/13/21 0001      Assessment   Medical Diagnosis Right breast cancer    Referring Provider (PT) Dr. Nicholas Lose    Onset Date/Surgical Date 04/11/21    Hand Dominance Right     Prior Therapy none      Precautions   Precautions Other (comment)    Precaution Comments active cancer      Restrictions   Weight Bearing Restrictions No      Balance Screen   Has the patient fallen in the past 6 months No    Has the patient had a decrease in activity level because of a fear of falling?  No    Is the patient reluctant to leave their home because of a fear of falling?  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    Leisure She has been doing Zumba 30 min 2x/week      Cognition   Overall Cognitive Status Within Functional Limits for tasks assessed      Posture/Postural Control   Posture/Postural Control Postural limitations    Postural Limitations Rounded Shoulders;Forward head      ROM / Strength   AROM / PROM / Strength AROM;Strength  AROM   Overall AROM Comments Cervical AROM is WNL except    AROM Assessment Site Shoulder    Right/Left Shoulder Right;Left    Right Shoulder Extension 56 Degrees    Right Shoulder Flexion 155 Degrees    Right Shoulder ABduction 171 Degrees    Right Shoulder Internal Rotation 60 Degrees    Right Shoulder External Rotation 84 Degrees    Left Shoulder Extension 50 Degrees    Left Shoulder Flexion 152 Degrees    Left Shoulder ABduction 167 Degrees    Left Shoulder Internal Rotation 74 Degrees    Left Shoulder External Rotation 88 Degrees      Strength   Overall Strength Within functional limits for tasks performed             LYMPHEDEMA/ONCOLOGY QUESTIONNAIRE - 04/13/21 0001      Type   Cancer Type Right breast      Lymphedema Assessments   Lymphedema Assessments Upper extremities      Right Upper Extremity Lymphedema   10 cm Proximal to Olecranon Process 26  cm    Olecranon Process 22.1 cm    10 cm Proximal to Ulnar Styloid Process 21.3 cm    Just Proximal to Ulnar Styloid Process 14.6 cm    Across Hand at [employer] 19 cm    At [place] of 2nd Digit 5.8 cm      Left Upper Extremity Lymphedema   10 cm Proximal to Olecranon Process 25.3 cm    Olecranon Process 21.7 cm    10 cm Proximal to Ulnar Styloid Process 20.2 cm    Just Proximal to Ulnar Styloid Process 14 cm    Across Hand at [employer] 19.1 cm    At [place] of 2nd Digit 5.6 cm           L-DEX FLOWSHEETS - 04/13/21 1300      L-DEX LYMPHEDEMA SCREENING   Measurement Type Unilateral    L-DEX MEASUREMENT EXTREMITY Upper Extremity    POSITION  Standing    DOMINANT SIDE Right    At Risk Side Right    BASELINE SCORE (UNILATERAL) 1.6           The patient was assessed using the L-Dex machine today to produce a lymphedema index baseline score. The patient will be reassessed on a regular basis (typically every 3 months) to obtain new L-Dex scores. If the score is > 6.5 points away from his/her baseline score indicating onset of subclinical lymphedema, it will be recommended to wear a compression garment for 4 weeks, 12 hours per day and then be reassessed. If the score continues to be > 6.5 points from baseline at reassessment, we will initiate lymphedema treatment. Assessing in this manner has a 95% rate of preventing clinically significant lymphedema.      Chelsea Wise - 04/13/21 0001    Open a tight or new jar No difficulty    Do heavy household chores (wash walls, wash floors) No difficulty    Carry a shopping bag or briefcase No difficulty    Wash your back No difficulty    Use a knife to cut food No difficulty    Recreational activities in which you take some force or impact through your arm, shoulder, or hand (golf, hammering, tennis) No difficulty    During the past week, to what extent has your arm, shoulder or hand problem interfered with your normal social activities  with family, friends, neighbors, or groups? Not  at all    During the past week, to what extent has your arm, shoulder or hand problem limited your work or other regular daily activities Not at all    Arm, shoulder, or hand pain. None    Tingling (pins and needles) in your arm, shoulder, or hand None    Difficulty Sleeping No difficulty    DASH Score 0 %            Objective measurements completed on examination: See above findings.          Patient was instructed today in a home exercise program today for post op shoulder range of motion. These included active assist shoulder flexion in sitting, scapular retraction, wall walking with shoulder abduction, and hands behind head external rotation.  She was encouraged to do these twice a day, holding 3 seconds and repeating 5 times when permitted by her physician.         PT Education - 04/13/21 1351    Education Details Lymphedema risk reduction and post op shoulder ROM HEP    Person(s) Educated Patient;Spouse    Methods Explanation;Demonstration;Handout    Comprehension Returned demonstration;Verbalized understanding               PT Long Term Goals - 04/13/21 1357      PT LONG TERM GOAL #1   Title Patient will dmeonstrate she has regained full shoulder ROM and function post op compared to baselines.    Time 6    Period Months    Status New    Target Date 10/14/21           Breast Clinic Goals - 04/13/21 1356      Patient will be able to verbalize understanding of pertinent lymphedema risk reduction practices relevant to her diagnosis specifically related to skin care.   Time 1    Period Days    Status Achieved      Patient will be able to return demonstrate and/or verbalize understanding of the post-op home exercise program related to regaining shoulder range of motion.   Time 1    Period Days    Status Achieved      Patient will be able to verbalize understanding of the importance of attending the  postoperative After Breast Cancer Class for further lymphedema risk reduction education and therapeutic exercise.   Time 1    Period Days    Status Achieved                 Plan - 04/13/21 1352    Clinical Impression Statement Patient was diagnosed on 04/11/2021 with right grade II invasive ductal carcinoma breast cancer. It measures 4.5 cm and is located in the upper inner quadrant. It is ER positive, weakly PR positive, and HER2 equivocal. Ki67 is 25%. She has 2 abnormal appearing axillary lymph nodes. One was biopsied and found to be positive. Her multidisciplinary medical team met prior to hre assessments to determine a recommended treatment plan. She is planning to have an MRI. Depending on those results, she will have a right mastectomy and targeted axillary node dissection and Mammaprint testing on the core biopsy if </= 3 axillary nodes appear abnormal or she will undergo neoadjuvnat chemotherapy if > 3 nodes appear abnormal followed by a right mastectomy and targeted axillary node dissection. Either scenario will be followed by radiation and anti-estrogen therapy. She will benefit from a post op PT reassessment to determine needs and from L-Dex screens every 3 months  for 2 years to detect subclinical lymphedema.    Stability/Clinical Decision Making Stable/Uncomplicated    Clinical Decision Making Low    Rehab Potential Excellent    PT Frequency --   Eval and 1 f/u visit   PT Treatment/Interventions Therapeutic exercise;ADLs/Self Care Home Management;Patient/family education    PT Next Visit Plan Will reassess 3-4 weeks post op    PT Home Exercise Plan Post op shoulder ROM HEP    Consulted and Agree with Plan of Care Patient;Family member/caregiver    Family Member Consulted husband           Patient will benefit from skilled therapeutic intervention in order to improve the following deficits and impairments:  Postural dysfunction,Decreased range of motion,Impaired UE functional  use,Pain,Decreased knowledge of precautions  Visit Diagnosis: Malignant neoplasm of upper-inner quadrant of right breast in female, estrogen receptor positive ([place]) - Plan: PT plan of care cert/re-cert  Abnormal posture - Plan: PT plan of care cert/re-cert   Patient will follow up at outpatient cancer rehab 3-4 weeks following surgery.  If the patient requires physical therapy at that time, a specific plan will be dictated and sent to the referring physician for approval. The patient was educated today on appropriate basic range of motion exercises to begin post operatively and the importance of attending the After Breast Cancer class following surgery.  Patient was educated today on lymphedema risk reduction practices as it pertains to recommendations that will benefit the patient immediately following surgery.  She verbalized good understanding.      Problem List Patient Active Problem List   Diagnosis Date Noted  . Malignant neoplasm of upper-inner quadrant of right breast in female, estrogen receptor positive ([place]) 04/13/2021  . Family history of prostate cancer   . Family history of pancreatic cancer   . Family history of breast cancer   . Family history of lung cancer   . Active labor 03/15/2016  . Postpartum care following vaginal delivery 03/15/2016  . SVD (spontaneous vaginal delivery) 01/18/2014  . Labor and delivery, indication for care 01/17/2014  . Antepartum bleeding, third trimester 01/01/2014  . Placenta previa in second trimester 11/28/2013   Annia Friendly, PT 04/13/21 1:59 PM  [place], [place], [address] Phone: [phone]   Fax:  [phone]  Name: Chelsea Wise MRN: [id number] Date of Birth: 12/28/1980

## 2021-04-13 NOTE — Progress Notes (Signed)
REFERRING PROVIDER: Nicholas Lose, MD [place],  [place] [address]  PRIMARY PROVIDER:  Patient, [name] (Inactive)  PRIMARY REASON FOR VISIT:  1. Malignant neoplasm of upper-inner quadrant of right breast in female, estrogen receptor positive ([place])   2. Family history of prostate cancer   3. Family history of pancreatic cancer   4. Family history of breast cancer   5. Family history of lung cancer      HISTORY OF PRESENT ILLNESS:   Ms. Filsaime, a 40 y.o. female, was seen for a [place] cancer genetics consultation at the request of Dr. Lindi Adie due to a personal and family history of cancer.  Ms. Mocarski presents to clinic today to discuss the possibility of a hereditary predisposition to cancer, genetic testing, and to further clarify her future cancer risks, as well as potential cancer risks for family members.   In May of 2022, at the age of 64, Ms. 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 The tumor is ER and PR positive, Her2 equivocal by IHC.   The treatment plan includes the following: 1.  Breast MRI: If the number of axillary lymph nodes is 3 or less, we will send for MammaPrint testing to determine if she would need chemotherapy.  If she is high risk on MammaPrint or for more lymph nodes then she will need neoadjuvant chemo. 2. mastectomy with targeted node dissection 3.  Adjuvant radiation therapy 4.  Followed by adjuvant antiestrogen therapy  CANCER HISTORY:  Oncology History  Malignant neoplasm of upper-inner quadrant of right breast in female, estrogen receptor positive ([place])  04/08/2021 Initial Diagnosis   Palpable right breast mass: Mammogram and US showed a 4.5cm right breast mass at the 12-1 o'clock position with associated microcalcifications, multiple additional masses from the 9-11 o'clock position in the right breast, calcifications in the subareolar right breast, 1.4cm, and two  enlarged right axillary lymph nodes. Biopsy showed invasive and in situ ductal carcinoma in the breast and axilla, grade 2 ER 80%, PR 1%, Ki-67 25%, HER2 equivalent by [facility]   04/08/2021 Cancer Staging   Staging form: Breast, AJCC 8th Edition - Clinical stage from 04/08/2021: Stage IIA (cT2, cN1, cM0, G2, ER+, PR+, HER2-) - Signed by Nicholas Lose, MD on 04/13/2021 Stage prefix: Initial diagnosis Histologic grading system: 3 grade system      Past Medical History:  Diagnosis Date  . Family history of breast cancer   . Family history of lung cancer   . Family history of pancreatic cancer   . Family history of prostate cancer   . Medical history non-contributory   . Placenta previa   . SVD (spontaneous vaginal delivery) 01/18/2014    Past Surgical History:  Procedure Laterality Date  . NO PAST SURGERIES    . WISDOM TOOTH EXTRACTION      Social History   Socioeconomic History  . Marital status: Married    Spouse name: Not on file  . Number of children: Not on file  . Years of education: Not on file  . Highest education level: Not on file  Occupational History  . Not on file  Tobacco Use  . Smoking status: Never Smoker  . Smokeless tobacco: Never Used  Substance and Sexual Activity  . Alcohol use: No    Comment: social  . Drug use: No  . Sexual activity: Yes    Partners: Male  Other Topics Concern  . Not  on file  Social History Narrative  . Not on file   Social Determinants of Health   Financial Resource Strain: Not on file  Food Insecurity: Not on file  Transportation Needs: Not on file  Physical Activity: Not on file  Stress: Not on file  Social Connections: Not on file     FAMILY HISTORY:  We obtained a detailed, 4-generation family history.  Significant diagnoses are listed below: Family History  Problem Relation Age of Onset  . Prostate cancer Maternal Grandfather 70  . Hypertension Father   . Prostate cancer Father 42  . Diabetes Maternal Aunt   .  Hypertension Maternal Aunt   . Diabetes Maternal Uncle   . Hypertension Maternal Uncle   . Pancreatic cancer Paternal Grandmother        dx 85s  . Stroke Paternal Grandfather   . Breast cancer Maternal Aunt 51  . Lung cancer Maternal Aunt 78  . Lung cancer Maternal Aunt        smoker  . Prostate cancer Paternal Uncle        dx 88s  . Prostate cancer Other        paternal great-uncle (PGM's brother)  . Lung cancer Maternal Aunt 49       smoker  . Lung cancer Maternal Uncle 58       smoker   Ms. Friedhoff has one daughter (age 58) and one son (age 62). She has one brother (age 77) and two sisters (ages 47 and 33). None of these relatives have had cancer.  Ms. Tartt mother is alive at age 47 without cancer. There were five maternal aunts and seven maternal uncles. One aunt had breast cancer around age 38 and lung cancer at age 13. Another aunt had lung cancer (unknown age). A third aunt died from lung cancer at age 68. One uncle also died from lung cancer at age 99. There is no known cancer among maternal cousins. Ms. Barbier's maternal grandmother died at age 19 without cancer. Her maternal grandfather died at age 62 and had a history of prostate cancer diagnosed around age 86.  Ms. Gell's father is alive at age 47 and had a history of prostate cancer at age 52. There were two paternal aunts and four paternal uncles. One uncle had prostate cancer diagnosed in his 64s. There is no known cancer among paternal cousins. Ms. Brasington's paternal grandmother died in her 29s from pancreatic cancer. Her paternal grandfather died at age 92 without cancer. There is also a paternal great-uncle (PGM's brother) who has had prostate cancer.  Ms. Wilner is unaware of previous family history of genetic testing for hereditary cancer risks. Patient's ancestors are of unknown descent. There is no reported Ashkenazi Jewish ancestry. There is no known consanguinity.  GENETIC COUNSELING ASSESSMENT: Ms. Fleagle is a 40  y.o. female with a personal history of breast cancer and a family history of prostate cancer, pancreatic cancer, breast cancer, and lung cancer, which is somewhat suggestive of a hereditary cancer syndrome and predisposition to cancer. We, therefore, discussed and recommended the following at today's visit.   DISCUSSION: We discussed that approximately 5-10% of breast cancer is hereditary,  meaning that it is due to a mutation in a single gene that is passed down from generation to generation in a family. Most cases of hereditary breast cancer are associated with the BRCA1 and BRCA2 genes. There are other genes that can be associated with hereditary breast cancer syndromes. These include ATM, CHEK2,  PALB2, etc. We discussed that testing is beneficial for several reasons, including knowing about other cancer risks, identifying potential screening and risk-reduction options that may be appropriate, and to understand if other family members could be at risk for cancer and allow them to undergo genetic testing.   We reviewed the characteristics, features and inheritance patterns of hereditary cancer syndromes. We also discussed genetic testing, including the appropriate family members to test, the process of testing, insurance coverage and turn-around-time for results. We discussed the implications of a negative, positive and/or variant of uncertain significant result. In order to get genetic test results in a timely manner so that Ms. Stegmaier can use these genetic test results for surgical decisions, we recommended Ms. Parfitt pursue genetic testing for the [employer]. Once complete, we recommend Ms. Fly pursue reflex genetic testing to the CancerNext-Expanded + RNAinsight gene panel.   The BRCAplus panel offered by [employer] and includes sequencing and deletion/duplication analysis for the following 8 genes: ATM, BRCA1, BRCA2, CDH1, CHEK2, PALB2, PTEN, and TP53. The CancerNext-Expanded +  RNAinsight gene panel offered by [employer] and includes sequencing and rearrangement analysis for the following 77 genes: AIP, ALK, APC, ATM, AXIN2, BAP1, BARD1, BLM, BMPR1A, BRCA1, BRCA2, BRIP1, CDC73, CDH1, CDK4, CDKN1B, CDKN2A, CHEK2, CTNNA1, DICER1, FANCC, FH, FLCN, GALNT12, KIF1B, LZTR1, MAX, MEN1, MET, MLH1, MSH2, MSH3, MSH6, MUTYH, NBN, NF1, NF2, NTHL1, PALB2, PHOX2B, PMS2, POT1, PRKAR1A, PTCH1, PTEN, RAD51C, RAD51D, RB1, RECQL, RET, SDHA, SDHAF2, SDHB, SDHC, SDHD, SMAD4, SMARCA4, SMARCB1, SMARCE1, STK11, SUFU, TMEM127, TP53, TSC1, TSC2, VHL and XRCC2 (sequencing and deletion/duplication); EGFR, EGLN1, HOXB13, KIT, MITF, PDGFRA, POLD1 and POLE (sequencing only); EPCAM and GREM1 (deletion/duplication only). RNA data is routinely analyzed for use in variant interpretation for all genes.  Based on Ms. Edler's personal and family history of cancer, she meets medical criteria for genetic testing. Despite that she meets criteria, she may still have an out of pocket cost.   PLAN: After considering the risks, benefits, and limitations, Ms. Sailer provided informed consent to pursue genetic testing and the blood sample was sent to [facility] for analysis of the BRCAplus and CancerNext-Expanded + RNAinsight panels. Results should be available within approximately one-two weeks' time, at which point they will be disclosed by telephone to Ms. Devery, as will any additional recommendations warranted by these results. Ms. Yearwood will receive a summary of her genetic counseling visit and a copy of her results once available. This information will also be available in Epic.   Ms. Zarzycki questions were answered to her satisfaction today. Our contact information was provided should additional questions or concerns arise. Thank you for the referral and allowing [place] to share in the care of your patient.   Clint Guy, [place], [facility] Licensed, Certified [email or web address] Phone: [phone]  The patient was seen for a total of 20 minutes in face-to-face genetic counseling.  This patient was discussed with Drs. Magrinat, Lindi Adie and/or Burr Medico who agrees with the above.    _______________________________________________________________________ For Office Staff:  Number of people involved in session: 1 Was an Intern/ student involved with case: no

## 2021-04-13 NOTE — Patient Instructions (Signed)

## 2021-04-13 NOTE — Assessment & Plan Note (Signed)
04/08/2021:Palpable right breast mass: Mammogram and US showed a 4.5cm right breast mass at the 12-1 o'clock position with associated microcalcifications, multiple additional masses from the 9-11 o'clock position in the right breast, calcifications in the subareolar right breast, 1.4cm, and two enlarged right axillary lymph nodes. Biopsy showed invasive and in situ ductal carcinoma in the breast and axilla, grade 2 ER 80%, PR 1%, Ki-67 25%, HER2 equivalent by IHC,  Pathology and radiology counseling: Discussed with the patient, the details of pathology including the type of breast cancer,the clinical staging, the significance of ER, PR and HER-2/neu receptors and the implications for treatment. After reviewing the pathology in detail, we proceeded to discuss the different treatment options between surgery, radiation, chemotherapy, antiestrogen therapies.  Treatment plan: 1.  Breast MRI: If the number of axillary lymph nodes is 3 or less, we will send for MammaPrint testing to determine if she would need chemotherapy.  If she is high risk on MammaPrint or for more lymph nodes then she will need neoadjuvant chemo. 2. mastectomy with targeted node dissection 3.  Adjuvant radiation therapy 4.  Followed by adjuvant antiestrogen therapy  Return to clinic based upon breast MRI and MammaPrint test result

## 2021-04-14 ENCOUNTER — Telehealth: Payer: Self-pay | Admitting: *Deleted

## 2021-04-14 NOTE — Telephone Encounter (Signed)
Spoke to pt concerning new pt appt from 5.11.22. Denies questions or concerns regarding dx or treatment care plan. Confirmed MRI for 5/13. Encourage pt to call with needs. Received verbal understanding.

## 2021-04-15 ENCOUNTER — Telehealth: Payer: Self-pay | Admitting: *Deleted

## 2021-04-15 ENCOUNTER — Ambulatory Visit
Admission: RE | Admit: 2021-04-15 | Discharge: 2021-04-15 | Disposition: A | Discharge status: Home / Self Care | Payer: BLUE CROSS/BLUE SHIELD | Source: Ambulatory Visit | Attending: Hematology and Oncology | Admitting: Hematology and Oncology

## 2021-04-15 ENCOUNTER — Other Ambulatory Visit: Payer: Self-pay

## 2021-04-15 ENCOUNTER — Encounter: Payer: Self-pay | Admitting: *Deleted

## 2021-04-15 DIAGNOSIS — Z170 Estrogen receptor positive status [ER+]: Secondary | ICD-10-CM

## 2021-04-15 MED ORDER — GADOBUTROL 1 MMOL/ML IV SOLN
6.0000 mL | Freq: Once | INTRAVENOUS | Status: AC | PRN
  Administered 2021-04-15: 6 mL via INTRAVENOUS

## 2021-04-15 NOTE — Telephone Encounter (Signed)
Received order for Mammaprint testing. Requisition faxed to [facility] and [facility] pt with MRI results. Denies questions at this time.

## 2021-04-19 ENCOUNTER — Telehealth: Payer: Self-pay | Admitting: Genetic Counselor

## 2021-04-19 ENCOUNTER — Encounter: Payer: Self-pay | Admitting: Genetic Counselor

## 2021-04-19 DIAGNOSIS — Z1379 Encounter for other screening for genetic and chromosomal anomalies: Secondary | ICD-10-CM | POA: Insufficient documentation

## 2021-04-19 NOTE — Telephone Encounter (Signed)
Called to discuss genetic test results - Chelsea Wise is driving and unable to talk right now. We will call again in approximately 20 minutes when she is home.

## 2021-04-19 NOTE — Telephone Encounter (Signed)
Revealed negative genetic testing through the [facility] panel. Discussed that we do not know why she has breast cancer or why there is cancer in the family. There could be a genetic mutation in the family that Chelsea Wise did not inherit. There could also be a mutation in a different gene that we are not testing, or our current technology may not be able to detect certain mutations. It will therefore be important for her to stay in contact with genetics to keep up with whether additional testing may be appropriate in the future.   Additional genetic testing through the [facility] CancerNext-Expanded + RNAinsight panel is pending. We will call her once these results are available.

## 2021-04-21 ENCOUNTER — Encounter: Payer: Self-pay | Admitting: Licensed Clinical Social Worker

## 2021-04-21 NOTE — Progress Notes (Signed)
[place] Work  Clinical Social Work was referred by new patient protocol for assessment of psychosocial needs.  Clinical Social Worker contacted patient by phone  to offer support and assess for needs.   CSW informed patient of the support team and support services at [facility].  Patient is potentially interested in attending cooking class through [facility] CSW added patient to monthly support programs calendar mailing list.  CSW provided contact information and encouraged patient to call with any questions or concerns.    [place], [place] Worker [employer]

## 2021-04-27 ENCOUNTER — Telehealth: Payer: Self-pay | Admitting: Genetic Counselor

## 2021-04-27 ENCOUNTER — Encounter: Payer: Self-pay | Admitting: Genetic Counselor

## 2021-04-27 ENCOUNTER — Ambulatory Visit: Payer: Self-pay | Admitting: Genetic Counselor

## 2021-04-27 DIAGNOSIS — Z1379 Encounter for other screening for genetic and chromosomal anomalies: Secondary | ICD-10-CM

## 2021-04-27 NOTE — Telephone Encounter (Signed)
Disclosed negative genetic testing through the [facility] CancerNext-Expanded + RNAinsight panel.  Two variants of uncertain significance were detected - one in the APC gene called c.7265C>T (p.T2422I) and a second in the BAP1 gene called c.905C>T (p.P302L). Her result is still considered normal at this time and should not impact her medical management.

## 2021-04-27 NOTE — Progress Notes (Signed)
HPI:  Ms. Wisdom was previously seen in the [facility] due to a personal and family history of cancer and concerns regarding a hereditary predisposition to cancer. Please refer to our prior cancer genetics clinic note for more information regarding our discussion, assessment and recommendations, at the time. Ms. Protzman recent genetic test results were disclosed to her, as were recommendations warranted by these results. These results and recommendations are discussed in more detail below.  CANCER HISTORY:  Oncology History  Malignant neoplasm of upper-inner quadrant of right breast in female, estrogen receptor positive ([place])  04/08/2021 Initial Diagnosis   Palpable right breast mass: Mammogram and US showed a 4.5cm right breast mass at the 12-1 o'clock position with associated microcalcifications, multiple additional masses from the 9-11 o'clock position in the right breast, calcifications in the subareolar right breast, 1.4cm, and two enlarged right axillary lymph nodes. 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 Two variants of uncertain significance (VUS) were detected - one in the APC gene called p.T2422I (c.7265C>T) and a second in the BAP1 gene called p.P302L (c.905C>T). The report dates are 04/19/2021 and 04/25/2021, respectively.  The BRCAplus panel offered by [employer] and includes sequencing and deletion/duplication analysis for the following 8 genes: ATM, BRCA1,  BRCA2, CDH1, CHEK2, PALB2, PTEN, and TP53. The CancerNext-Expanded + RNAinsight gene panel offered by [facility] and includes sequencing and rearrangement analysis for the following 77 genes: AIP, ALK, APC, ATM, AXIN2, BAP1, BARD1, BLM, BMPR1A, BRCA1, BRCA2, BRIP1, CDC73, CDH1, CDK4, CDKN1B, CDKN2A, CHEK2, CTNNA1, DICER1, FANCC, FH, FLCN, GALNT12, KIF1B, LZTR1, MAX, MEN1, MET, MLH1, MSH2, MSH3, MSH6, MUTYH, NBN, NF1, NF2, NTHL1, PALB2, PHOX2B, PMS2, POT1, PRKAR1A, PTCH1, PTEN, RAD51C, RAD51D, RB1, RECQL, RET, SDHA, SDHAF2, SDHB, SDHC, SDHD, SMAD4, SMARCA4, SMARCB1, SMARCE1, STK11, SUFU, TMEM127, TP53, TSC1, TSC2, VHL and XRCC2 (sequencing and deletion/duplication); EGFR, EGLN1, HOXB13, KIT, MITF, PDGFRA, POLD1 and POLE (sequencing only); EPCAM and GREM1 (deletion/duplication only). RNA data is routinely analyzed for use in variant interpretation for all genes.      FAMILY HISTORY:  We obtained a detailed, 4-generation family history.  Significant diagnoses are listed below: Family History  Problem Relation Age of Onset  . Prostate cancer Maternal Grandfather 68  . Hypertension Father   . Prostate cancer Father 37  . Diabetes Maternal Aunt   . Hypertension Maternal Aunt   . Diabetes Maternal Uncle   . Hypertension Maternal Uncle   . Pancreatic cancer Paternal Grandmother        dx 30s  . Stroke Paternal Grandfather   . Breast cancer Maternal Aunt 1  . Lung cancer Maternal Aunt 78  . Lung cancer Maternal Aunt        smoker  . Prostate cancer Paternal Uncle        dx 76s  . Prostate cancer Other        paternal great-uncle (PGM's brother)  . Lung cancer Maternal Aunt 49       smoker  . Lung  cancer Maternal Uncle 55       smoker   Ms. Albaugh has one daughter (age 90) and one son (age 16). She has one brother (age 21) and two sisters (ages 35 and 67). None of these relatives have had cancer.  Ms. Larkin mother is alive at age 29 without cancer. There were five maternal aunts and  seven maternal uncles. One aunt had breast cancer around age 30 and lung cancer at age 79. Another aunt had lung cancer (unknown age). A third aunt died from lung cancer at age 40. One uncle also died from lung cancer at age 15. There is no known cancer among maternal cousins. Ms. Zimmerle's maternal grandmother died at age 73 without cancer. Her maternal grandfather died at age 45 and had a history of prostate cancer diagnosed around age 91.  Ms. Belleville's father is alive at age 71 and had a history of prostate cancer at age 18. There were two paternal aunts and four paternal uncles. One uncle had prostate cancer diagnosed in his 74s. There is no known cancer among paternal cousins. Ms. Laidlaw's paternal grandmother died in her 38s from pancreatic cancer. Her paternal grandfather died at age 66 without cancer. There is also a paternal great-uncle (PGM's brother) who has had prostate cancer.  Ms. Siler is unaware of previous family history of genetic testing for hereditary cancer risks. Patient's ancestors are of unknown descent. There is no reported Ashkenazi Jewish ancestry. There is no known consanguinity.  GENETIC TEST RESULTS: Genetic testing reported out on 04/19/2021 through the [facility] panel, and 04/25/2021 through the CancerNext-Expanded + RNAinsight panel. No pathogenic variants were detected.   The BRCAplus panel offered by [employer] and includes sequencing and deletion/duplication analysis for the following 8 genes: ATM, BRCA1, BRCA2, CDH1, CHEK2, PALB2, PTEN, and TP53. The CancerNext-Expanded + RNAinsight gene panel offered by [employer] and includes sequencing and rearrangement analysis for the following 77 genes: AIP, ALK, APC, ATM, AXIN2, BAP1, BARD1, BLM, BMPR1A, BRCA1, BRCA2, BRIP1, CDC73, CDH1, CDK4, CDKN1B, CDKN2A, CHEK2, CTNNA1, DICER1, FANCC, FH, FLCN, GALNT12, KIF1B, LZTR1, MAX, MEN1, MET, MLH1, MSH2, MSH3, MSH6, MUTYH, NBN, NF1, NF2, NTHL1, PALB2, PHOX2B, PMS2, POT1,  PRKAR1A, PTCH1, PTEN, RAD51C, RAD51D, RB1, RECQL, RET, SDHA, SDHAF2, SDHB, SDHC, SDHD, SMAD4, SMARCA4, SMARCB1, SMARCE1, STK11, SUFU, TMEM127, TP53, TSC1, TSC2, VHL and XRCC2 (sequencing and deletion/duplication); EGFR, EGLN1, HOXB13, KIT, MITF, PDGFRA, POLD1 and POLE (sequencing only); EPCAM and GREM1 (deletion/duplication only). RNA data is routinely analyzed for use in variant interpretation for all genes. The test report will be scanned into EPIC and located under the Molecular Pathology section of the Results Review tab.  A portion of the result report is included below for reference.     We discussed with Ms. Mcgirr that because current genetic testing is not perfect, it is possible there may be a gene mutation in one of these genes that current testing cannot detect, but that chance is small.  We also discussed that there could be another gene that has not yet been discovered, or that we have not yet tested, that is responsible for the cancer diagnoses in the family. It is also possible there is a hereditary cause for the cancer in the family that Ms. Philyaw did not inherit and therefore was not identified in her testing.  Therefore, it is important to remain in touch with cancer genetics in the future so that we can continue to offer Ms. Maines the most up to date genetic testing.  Genetic testing did identify two variants of uncertain significance (VUS) - one in the APC gene called p.T2422I (c.7265C>T) and a second in the BAP1 gene called p.P302L (c.905C>T).  At this time, it is unknown if these variants are associated with increased cancer risk or if they are normal findings, but most variants such as these get reclassified to being inconsequential. They should not be used to make medical management decisions. With time, we suspect the lab will determine the significance of these variants, if any. If we do learn more about them, we will try to contact Ms. Frogge to discuss it further. However, it is  important to stay in touch with [place] periodically and keep the address and phone number up to date.  CANCER SCREENING RECOMMENDATIONS: Ms. Kesinger test result is considered negative (normal).  This means that we have not identified a hereditary cause for her personal and family history of cancer at this time. While reassuring, this does not definitively rule out a hereditary predisposition to cancer. It is still possible that there could be genetic mutations that are undetectable by current technology. There could be genetic mutations in genes that have not been tested or identified to increase cancer risk. Therefore, it is recommended she continue to follow the cancer management and screening guidelines provided by her oncology and primary healthcare provider.   An individual's cancer risk and medical management are not determined by genetic test results alone. Overall cancer risk assessment incorporates additional factors, including personal medical history, family history, and any available genetic information that may result in a personalized plan for cancer prevention and surveillance.  RECOMMENDATIONS FOR FAMILY MEMBERS:  Individuals in this family might be at some increased risk of developing cancer, over the general population risk, simply due to the family history of cancer.  We recommended women in this family have a yearly mammogram beginning at age 58, or 57 years younger than the earliest onset of cancer, an annual clinical breast exam, and perform monthly breast self-exams. Women in this family should also have a gynecological exam as recommended by their primary provider. All family members should be referred for colonoscopy starting at age 29.  It is also possible there is a hereditary cause for the cancer in Ms. Casteneda's family that she did not inherit and therefore was not identified in her.  Based on Ms. Scaife's family history, we recommended her father, who was diagnosed with prostate  cancer at age 56, and his siblings have genetic counseling and testing. Ms. Biggs will let us know if we can be of any assistance in coordinating genetic counseling and/or testing for these family members.   FOLLOW-UP: Lastly, we discussed with Ms. Craker that cancer genetics is a rapidly advancing field and it is possible that new genetic tests will be appropriate for her and/or her family members in the future. We encouraged her to remain in contact with cancer genetics on an annual basis so we can update her personal and family histories and let her know of advances in cancer genetics that may benefit this family.   Our contact number was provided. Ms. Morea questions were answered to her satisfaction, and she knows she is welcome to call us at anytime with additional questions or concerns.   Clint Guy, MS, [facility] Genetic Counselor [email or web address] Phone: [phone]

## 2021-04-27 NOTE — Telephone Encounter (Signed)
Called to disclose additional genetic test results. No answer and unable to leave message because mailbox was full.

## 2021-04-28 ENCOUNTER — Telehealth: Payer: Self-pay | Admitting: *Deleted

## 2021-04-28 NOTE — Telephone Encounter (Signed)
Received mammaprint results on core bx of HIGH RISK. Physician team notified. Called pt with results. Scheduled and confirmed appt with Dr. Lindi Adie on 5/27 at 12:15. No further questions voiced at this time.

## 2021-04-29 ENCOUNTER — Other Ambulatory Visit: Payer: Self-pay | Admitting: General Surgery

## 2021-04-29 ENCOUNTER — Encounter: Payer: Self-pay | Admitting: *Deleted

## 2021-04-29 ENCOUNTER — Encounter: Payer: Self-pay | Admitting: Emergency Medicine

## 2021-04-29 ENCOUNTER — Inpatient Hospital Stay (HOSPITAL_BASED_OUTPATIENT_CLINIC_OR_DEPARTMENT_OTHER): Payer: Federal, State, Local not specified - PPO | Admitting: Hematology and Oncology

## 2021-04-29 ENCOUNTER — Other Ambulatory Visit: Payer: Self-pay

## 2021-04-29 ENCOUNTER — Telehealth: Payer: Self-pay | Admitting: Hematology and Oncology

## 2021-04-29 VITALS — BP 130/70 | HR 70 | Temp 97.7°F | Resp 18 | Ht 62.0 in | Wt 134.8 lb

## 2021-04-29 DIAGNOSIS — C50211 Malignant neoplasm of upper-inner quadrant of right female breast: Secondary | ICD-10-CM

## 2021-04-29 DIAGNOSIS — Z170 Estrogen receptor positive status [ER+]: Secondary | ICD-10-CM

## 2021-04-29 MED ORDER — LIDOCAINE-PRILOCAINE 2.5-2.5 % EX CREA: TOPICAL_CREAM | CUTANEOUS | 3 refills | Status: DC

## 2021-04-29 MED ORDER — DEXAMETHASONE 4 MG PO TABS: 4.0000 mg | ORAL_TABLET | Freq: Every day | ORAL | 0 refills | Status: DC

## 2021-04-29 MED ORDER — PROCHLORPERAZINE MALEATE 10 MG PO TABS: 10.0000 mg | ORAL_TABLET | Freq: Four times a day (QID) | ORAL | 1 refills | Status: DC | PRN

## 2021-04-29 MED ORDER — ONDANSETRON HCL 8 MG PO TABS: 8.0000 mg | ORAL_TABLET | Freq: Two times a day (BID) | ORAL | 1 refills | Status: DC | PRN

## 2021-04-29 NOTE — Research (Signed)
[id number] - TREATMENT OF REFRACTORY NAUSEA  04/29/21   Patient Chelsea Wise was identified by Dr. Lindi Adie as a potential candidate for the above listed study.  This Clinical Research Coordinator met with Andreia Gandolfi, MRN [id number], on 04/29/21 in a manner and location that ensures patient privacy to discuss participation in the above listed research study.  Patient is Accompanied by her husband.  A copy of the informed consent document and separate HIPAA Authorization was provided to the patient.  Patient reads, speaks, and understands [place].    Patient was provided with the business card of this Coordinator and encouraged to contact the research team with any questions.  Approximately 15 minutes were spent with the patient reviewing the informed consent documents.  Patient was provided the option of taking informed consent documents home to review and was encouraged to review at their convenience with their support network, including other care providers. Patient took the consent documents home to review.   Will call the patient next week once she has had time to review the consent documents.  Clabe Seal Clinical Research Coordinator I  04/29/21 1:53 PM

## 2021-04-29 NOTE — Progress Notes (Signed)
DISCONTINUE SELECTED CLINICAL TRIAL - Breast  Trial: [facility] 16070 - TREATMENT OF REFRACTORY NAUSEA  **Trial eligibility and accrual should be confirmed by your research team**  REASON: Other Reason PRIOR TREATMENT: Trial: [facility] 63875 - TREATMENT OF REFRACTORY NAUSEA TREATMENT RESPONSE: Unable to Evaluate  START ON PATHWAY REGIMEN - Breast     Cycles 1 through 4: A cycle is every 14 days:     Doxorubicin      Cyclophosphamide      Pegfilgrastim-xxxx    Cycles 5 through 16: A cycle is every 7 days:     Paclitaxel   **Always confirm dose/schedule in your pharmacy ordering system**  Patient Characteristics: Preoperative or Nonsurgical Candidate (Clinical Staging), Neoadjuvant Therapy followed by Surgery, Invasive Disease, Chemotherapy, HER2 Negative/Unknown/Equivocal, ER Positive Therapeutic Status: Preoperative or Nonsurgical Candidate (Clinical Staging) AJCC M Category: cM0 AJCC Grade: G2 Breast Surgical Plan: Neoadjuvant Therapy followed by Surgery ER Status: Positive (+) AJCC 8 Stage Grouping: IIIA HER2 Status: Negative (-) AJCC T Category: cT3 AJCC N Category: cN1 PR Status: Negative (-) Intent of Therapy: Curative Intent, Discussed with Patient

## 2021-04-29 NOTE — Assessment & Plan Note (Signed)
04/08/2021:Palpable right breast mass: Mammogram and US showed a 4.5cm right breast mass at the 12-1 o'clock position with associated microcalcifications, multiple additional masses from the 9-11 o'clock position in the right breast, calcifications in the subareolar right breast, 1.4cm, and two enlarged right axillary lymph nodes. 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  Neoadjuvant chemotherapy with dose dense Adriamycin and Cytoxan x4 followed by Taxol weekly x12 2. mastectomy with targeted node dissection 3.  Adjuvant radiation 4.  Followed by adjuvant antiestrogen therapy  Chemotherapy Counseling: I discussed the risks and benefits of chemotherapy including the risks of nausea/ vomiting, risk of infection from low WBC count, fatigue due to chemo or anemia, bruising or bleeding due to low platelets, mouth sores, loss/ change in taste and decreased appetite. Liver and kidney function will be monitored through out chemotherapy as abnormalities in liver and kidney function may be a side effect of treatment. Cardiac dysfunction due to Adriamycin was discussed in detail. Risk of permanent bone marrow dysfunction and leukemia and neuropathy risk of Taxol  were also discussed.  Plan: Port placement, echocardiogram, chemo class [facility] 16070: Treatment of refractory nausea.  After first cycle of chemo if patient experience chemo induced nausea and vomiting the randomized from cycle 2 to Aloxi plus Dex plus olanzapine or placebo plus Compazine or placebo plus placebo prior to chemo and take home medications for day 2 today for of Dex plus olanzapine or placebo and Compazine or placebo every 8 hours.  If patient does not have nausea after cycle 1, then  the trial is complete.  Return to clinic to start chemotherapy.

## 2021-04-29 NOTE — Progress Notes (Signed)
Patient Care Team: Patient, [name] (Inactive) as PCP - General (General Practice) [name], RN as Oncology Nurse Navigator [name], RN as Oncology Nurse Navigator  DIAGNOSIS:  Encounter Diagnosis  Name Primary?  . Malignant neoplasm of upper-inner quadrant of right breast in female, estrogen receptor positive ([place])     SUMMARY OF ONCOLOGIC HISTORY: Oncology History  Malignant neoplasm of upper-inner quadrant of right breast in female, estrogen receptor positive ([place])  04/08/2021 Initial Diagnosis   Palpable right breast mass: Mammogram and US showed a 4.5cm right breast mass at the 12-1 o'clock position with associated microcalcifications, multiple additional masses from the 9-11 o'clock position in the right breast, calcifications in the subareolar right breast, 1.4cm, and two enlarged right axillary lymph nodes. 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 Two variants of uncertain significance (VUS) were detected - one in the APC gene called p.T2422I (c.7265C>T) and a second in the BAP1 gene called p.P302L (c.905C>T). The report dates are 04/19/2021 and 04/25/2021, respectively.  The BRCAplus panel offered by [employer] and includes sequencing and deletion/duplication analysis for the following 8 genes: ATM, BRCA1, BRCA2, CDH1, CHEK2, PALB2, PTEN, and TP53. The CancerNext-Expanded + RNAinsight gene panel offered by [employer] and includes sequencing and  rearrangement analysis for the following 77 genes: AIP, ALK, APC, ATM, AXIN2, BAP1, BARD1, BLM, BMPR1A, BRCA1, BRCA2, BRIP1, CDC73, CDH1, CDK4, CDKN1B, CDKN2A, CHEK2, CTNNA1, DICER1, FANCC, FH, FLCN, GALNT12, KIF1B, LZTR1, MAX, MEN1, MET, MLH1, MSH2, MSH3, MSH6, MUTYH, NBN, NF1, NF2, NTHL1, PALB2, PHOX2B, PMS2, POT1, PRKAR1A, PTCH1, PTEN, RAD51C, RAD51D, RB1, RECQL, RET, SDHA, SDHAF2, SDHB, SDHC, SDHD, SMAD4, SMARCA4, SMARCB1, SMARCE1, STK11, SUFU, TMEM127, TP53, TSC1, TSC2, VHL and XRCC2 (sequencing and deletion/duplication); EGFR, EGLN1, HOXB13, KIT, MITF, PDGFRA, POLD1 and POLE (sequencing only); EPCAM and GREM1 (deletion/duplication only). RNA data is routinely analyzed for use in variant interpretation for all genes.      CHIEF COMPLIANT: Follow-up to discuss the results of MammaPrint  INTERVAL HISTORY: Chelsea Wise is a 40 year old with above-mentioned history of right breast cancer who is here to discuss results of MammaPrint test.  She was found to be high risk on MammaPrint.  She is here today accompanied by her husband to discuss adjuvant treatment plan.  ALLERGIES:  has No Known Allergies.  MEDICATIONS:  Current Outpatient Medications  Medication Sig Dispense Refill  . Acetaminophen (TYLENOL PO) Take 1 tablet by mouth daily as needed (headache).    . Docosahexaenoic Acid (PRENATAL DHA PO) Take 1 tablet by mouth daily.    [place] Kitchen ibuprofen (ADVIL,MOTRIN) 600 MG tablet Take 1 tablet (600 mg total) by mouth every 6 (six) hours. 30 tablet 0  . naproxen (NAPROSYN) 500 MG tablet Take 1 tablet (500 mg total) by mouth 2 (two) times daily. 30 tablet 0  . tiZANidine (ZANAFLEX) 4 MG tablet Take 1 tablet (4 mg total) by mouth every 6 (six) hours as needed for muscle spasms. 30 tablet 0   No current facility-administered  medications for this visit.    PHYSICAL EXAMINATION: ECOG PERFORMANCE STATUS: 1 - Symptomatic but completely ambulatory  Vitals:   04/29/21 1216  BP: 130/70  Pulse: 70   Resp: 18  Temp: 97.7 F (36.5 C)  SpO2: 100%   Filed Weights   04/29/21 1216  Weight: 134 lb 12.8 oz (61.1 kg)     LABORATORY DATA:  I have reviewed the data as listed CMP Latest Ref Rng & Units 04/13/2021 03/20/2013  Glucose 70 - 99 mg/dL 92 84  BUN 6 - 20 mg/dL 9 7  Creatinine 0.44 - 1.00 mg/dL 0.77 0.78  Sodium 135 - 145 mmol/L 139 136  Potassium 3.5 - 5.1 mmol/L 3.6 3.8  Chloride 98 - 111 mmol/L 103 102  CO2 22 - 32 mmol/L 28 25  Calcium 8.9 - 10.3 mg/dL 9.4 9.1  Total Protein 6.5 - 8.1 g/dL 7.5 -  Total Bilirubin 0.3 - 1.2 mg/dL 0.4 -  Alkaline Phos 38 - 126 U/L 45 -  AST 15 - 41 U/L 20 -  ALT 0 - 44 U/L 10 -    Lab Results  Component Value Date   WBC 5.7 04/13/2021   HGB 13.0 04/13/2021   HCT 37.5 04/13/2021   MCV 89.7 04/13/2021   PLT 290 04/13/2021   NEUTROABS 3.2 04/13/2021    ASSESSMENT & PLAN:  Malignant neoplasm of upper-inner quadrant of right breast in female, estrogen receptor positive ([place]) 04/08/2021:Palpable right breast mass: Mammogram and US showed a 4.5cm right breast mass at the 12-1 o'clock position with associated microcalcifications, multiple additional masses from the 9-11 o'clock position in the right breast, calcifications in the subareolar right breast, 1.4cm, and two enlarged right axillary lymph nodes. 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  Neoadjuvant chemotherapy with dose dense Adriamycin and Cytoxan x4 followed by Taxol weekly x12 2. mastectomy with targeted node dissection 3.  Adjuvant radiation 4.  Followed by adjuvant antiestrogen therapy  Chemotherapy Counseling: I discussed the risks and benefits of chemotherapy including the risks of nausea/ vomiting, risk of  infection from low WBC count, fatigue due to chemo or anemia, bruising or bleeding due to low platelets, mouth sores, loss/ change in taste and decreased appetite. Liver and kidney function will be monitored through out chemotherapy as abnormalities in liver and kidney function may be a side effect of treatment. Cardiac dysfunction due to Adriamycin was discussed in detail. Risk of permanent bone marrow dysfunction and leukemia and neuropathy risk of Taxol  were also discussed.  Plan: Port placement, echocardiogram, chemo class URCC 16070: Treatment of refractory nausea.  After first cycle of chemo if patient experience chemo induced nausea and vomiting the randomized from cycle 2 to Aloxi plus Dex plus olanzapine or placebo plus Compazine or placebo plus placebo prior to chemo and take home medications for day 2 today for of Dex plus olanzapine or placebo and Compazine or placebo every 8 hours.  If patient does not have nausea after cycle 1, then the trial is complete.  Return to clinic to start chemotherapy.      No orders of the defined types were placed in this encounter.  The patient has a good understanding of the overall plan. she agrees with  it. she will call with any problems that may develop before the next visit here. Total time spent: 45 mins including face to face time and time spent for planning, charting and co-ordination of care   Harriette Ohara, MD 04/29/21

## 2021-04-29 NOTE — Progress Notes (Addendum)
CLINICAL TRIAL SELECTED - Breast  Trial: URCC 16070 - TREATMENT OF REFRACTORY NAUSEA  **Trial eligibility and accrual should be confirmed by your research team**  Patient Characteristics: Preoperative or Nonsurgical Candidate (Clinical Staging), Neoadjuvant Therapy followed by Surgery, Invasive Disease, Chemotherapy, HER2 Negative/Unknown/Equivocal, ER Positive Therapeutic Status: Preoperative or Nonsurgical Candidate (Clinical Staging) AJCC M Category: cM0 AJCC Grade: G2 Breast Surgical Plan: Neoadjuvant Therapy followed by Surgery ER Status: Positive (+) AJCC 8 Stage Grouping: IIIA HER2 Status: Negative (-) AJCC T Category: cT3 AJCC N Category: cN1 PR Status: Negative (-) Intent of Therapy: Curative Intent, Discussed with Patient

## 2021-04-29 NOTE — Telephone Encounter (Signed)
Scheduled chemo class per 5/27 sch msg. Called pt, no answer. Left msg with appt date and time.

## 2021-05-04 ENCOUNTER — Encounter: Payer: Self-pay | Admitting: *Deleted

## 2021-05-04 ENCOUNTER — Telehealth: Payer: Self-pay | Admitting: Emergency Medicine

## 2021-05-04 ENCOUNTER — Encounter: Payer: Self-pay | Admitting: Hematology and Oncology

## 2021-05-04 ENCOUNTER — Telehealth: Payer: Self-pay | Admitting: Hematology and Oncology

## 2021-05-04 ENCOUNTER — Other Ambulatory Visit: Payer: Self-pay

## 2021-05-04 ENCOUNTER — Inpatient Hospital Stay: Payer: Federal, State, Local not specified - PPO | Attending: Hematology and Oncology

## 2021-05-04 DIAGNOSIS — Z5189 Encounter for other specified aftercare: Secondary | ICD-10-CM | POA: Insufficient documentation

## 2021-05-04 DIAGNOSIS — Z5111 Encounter for antineoplastic chemotherapy: Secondary | ICD-10-CM | POA: Insufficient documentation

## 2021-05-04 DIAGNOSIS — C50211 Malignant neoplasm of upper-inner quadrant of right female breast: Secondary | ICD-10-CM | POA: Insufficient documentation

## 2021-05-04 DIAGNOSIS — C773 Secondary and unspecified malignant neoplasm of axilla and upper limb lymph nodes: Secondary | ICD-10-CM | POA: Insufficient documentation

## 2021-05-04 DIAGNOSIS — Z170 Estrogen receptor positive status [ER+]: Secondary | ICD-10-CM | POA: Insufficient documentation

## 2021-05-04 NOTE — Telephone Encounter (Signed)
Scheduled appts per 5/27 sch msg. Pt coming in today for Chemo Education class. Will have updated calendar printed when she comes in today.

## 2021-05-04 NOTE — Telephone Encounter (Signed)
[id number] - TREATMENT OF REFRACTORY NAUSEA  Called patient to check interest in this research study.  The patient informed this coordinator that she has decided not to participate in this research study.  The patient was thanked for time and consideration.  Clabe Seal Clinical Research Coordinator I  05/04/21  9:39 AM

## 2021-05-04 NOTE — Telephone Encounter (Signed)
DCP-001: Use of a Clinical Trial Screening Tool to Address Cancer Health Disparities in the [facility])  Introduced this study to the patient while talking to her over the phone.  Patient agreed for this coordinator to provide her with the informed consent form and HIPAA authorization for this study this afternoon while she is in the clinic for chemo education.  Will follow up with the patient once she has had time to review these forms.  Clabe Seal Clinical Research Coordinator I  05/04/21  9:41 AM

## 2021-05-05 ENCOUNTER — Encounter: Payer: Self-pay | Admitting: Hematology and Oncology

## 2021-05-05 ENCOUNTER — Encounter (HOSPITAL_COMMUNITY): Payer: Self-pay | Admitting: General Surgery

## 2021-05-05 ENCOUNTER — Other Ambulatory Visit: Payer: Self-pay

## 2021-05-05 NOTE — Progress Notes (Addendum)
Mrs Chelsea Wise denies chest pain or shortness of breath.  Patient denies any s/s of Covid in herself or her household. Mrs Chelsea Wise has not been exposed to anyone with s/s of Covid to her knowledge.  Ms. Chelsea Wise is having a Pre Chemo, Echo in am, I asked patient if she can come to [facility] after ECHO at [facility]. and call my number, I will bring Pre- OP Ensure, Soap and Pre- Procedure Instructions to her, Mrs Chelsea Wise said thatt she will.  Mrs Chelsea Wise came to pick up Pre- Surgery Ensure and soap.  I gave patient written regarding both.

## 2021-05-05 NOTE — Pre-Procedure Instructions (Addendum)
Chelsea Wise  05/05/2021       Your procedure is scheduled on Monday, June 6..  Report to [facility]  "Entrance A' at 2:00 PM                       If you have any questions or problems or if you are going to late call : [phone]- this is the Pre- Surgery Desk.     Remember:  Do not eat after midnight.  You may drink clear liquids until  1:30 PM .  Clear liquids allowed are:                     Water, Juice (non-citric and without pulp - diabetics please choose diet or no sugar options), Carbonated beverages - (diabetics please choose diet or no sugar options), Clear Tea, Black Coffee only (no creamer, milk or cream including half and half), Plain Jell-O only (diabetics please choose diet or no sugar options), Gatorade (diabetics please choose diet or no sugar options) and Plain Popsicles only     Please drink the Pre- Surgery drink between 12:30 PM - 1:30 PM    Take these medicines the morning of surgery with A SIP OF WATER, prior to 1:30 PM If needed: acetaminophen (TYLENOL)  cetirizine (ZYRTEC) sodium chloride (OCEAN)  STOP/Do Not start taking Aspirin, Aspirin Products (Goody Powder, Excedrin Migraine), Ibuprofen (Advil), Naproxen (Aleve), Vitamins and Herbal Products (ie Fish Oil).    [facility]- Preparing For Surgery  Do Not shave within 48 hours prior to surgery.  Before surgery, you can play an important role. Because skin is not sterile, your skin needs to be as free of germs as possible. You can reduce the number of germs on your skin by washing with CHG (chlorahexidine gluconate) Soap before surgery.  CHG is an antiseptic cleaner which kills germs and bonds with the skin to continue killing germs even after washing.    Oral Hygiene is also important to reduce your risk of infection.  Remember - BRUSH YOUR TEETH THE MORNING OF SURGERY WITH YOUR REGULAR TOOTHPASTE  Please do not use if you have an allergy to CHG or antibacterial soaps. If your  skin becomes reddened/irritated stop using the CHG.  Do not shave (including legs and underarms) for at least 48 hours prior to first CHG shower. It is OK to shave your face.  Please follow these instructions carefully.   1. Shower the NIGHT BEFORE SURGERY and the MORNING OF SURGERY with CHG.   2. If you chose to wash your hair, wash your hair first as usual with your normal shampoo.  3. Rinse you hair.  Wash your face and private area with your home soap, rinse thoroughly   4. Use CHG as you would any other liquid soap. You can apply CHG directly to the skin and wash gently with a scrungie or a clean washcloth.   5. Apply the CHG Soap to your body ONLY FROM THE NECK DOWN.  Do not use on open wounds or open sores. Avoid contact with your eyes, ears, mouth and genitals (private parts)  6. Wash thoroughly, paying special attention to the area where your surgery will be performed.  7. Thoroughly rinse your body with warm water from the neck down.  8. DO NOT shower/wash with your normal soap after using and rinsing off the CHG Soap.  9. Pat yourself dry with a CLEAN TOWEL.  10. Wear CLEAN PAJAMAS to bed the night before surgery, wear comfortable clothes the morning of surgery  11. Place CLEAN SHEETS on your bed the night of your first shower and DO NOT SLEEP WITH PETS.  Day of Surgery: Shower as instructed above. Do not apply any deodorants/lotions, powders or cologne..  Please wear clean clothes to the hospital/surgery center.   Remember to brush your teeth WITH YOUR REGULAR TOOTHPASTE.              Do not wear jewelry, make-up or nail polish.  Do not shave 48 hours prior to surgery.    Do not bring valuables to the hospital.  [facility] is not responsible for any belongings or valuables.  Contacts, dentures or bridgework may not be worn into surgery.    You need an adult to drive you home and to stay with you for the 1st 24 hours after surgery.

## 2021-05-06 ENCOUNTER — Ambulatory Visit (HOSPITAL_COMMUNITY)
Admission: RE | Admit: 2021-05-06 | Discharge: 2021-05-06 | Disposition: A | Discharge status: Home / Self Care | Payer: Federal, State, Local not specified - PPO | Source: Ambulatory Visit | Attending: Hematology and Oncology | Admitting: Hematology and Oncology

## 2021-05-06 DIAGNOSIS — I082 Rheumatic disorders of both aortic and tricuspid valves: Secondary | ICD-10-CM | POA: Diagnosis not present

## 2021-05-06 DIAGNOSIS — Z8042 Family history of malignant neoplasm of prostate: Secondary | ICD-10-CM | POA: Diagnosis not present

## 2021-05-06 DIAGNOSIS — Z0189 Encounter for other specified special examinations: Secondary | ICD-10-CM | POA: Diagnosis not present

## 2021-05-06 DIAGNOSIS — Z800 Family history of malignant neoplasm of digestive organs: Secondary | ICD-10-CM | POA: Diagnosis not present

## 2021-05-06 DIAGNOSIS — C50211 Malignant neoplasm of upper-inner quadrant of right female breast: Secondary | ICD-10-CM | POA: Insufficient documentation

## 2021-05-06 DIAGNOSIS — Z01818 Encounter for other preprocedural examination: Secondary | ICD-10-CM | POA: Insufficient documentation

## 2021-05-06 DIAGNOSIS — Z170 Estrogen receptor positive status [ER+]: Secondary | ICD-10-CM | POA: Insufficient documentation

## 2021-05-06 DIAGNOSIS — Z833 Family history of diabetes mellitus: Secondary | ICD-10-CM | POA: Diagnosis not present

## 2021-05-06 DIAGNOSIS — Z8249 Family history of ischemic heart disease and other diseases of the circulatory system: Secondary | ICD-10-CM | POA: Diagnosis not present

## 2021-05-06 DIAGNOSIS — Z823 Family history of stroke: Secondary | ICD-10-CM | POA: Diagnosis not present

## 2021-05-06 DIAGNOSIS — Z801 Family history of malignant neoplasm of trachea, bronchus and lung: Secondary | ICD-10-CM | POA: Diagnosis not present

## 2021-05-06 DIAGNOSIS — C50911 Malignant neoplasm of unspecified site of right female breast: Secondary | ICD-10-CM | POA: Diagnosis present

## 2021-05-06 DIAGNOSIS — Z803 Family history of malignant neoplasm of breast: Secondary | ICD-10-CM | POA: Diagnosis not present

## 2021-05-06 LAB — ECHOCARDIOGRAM COMPLETE
Area-P 1/2: 3.6 cm2
S' Lateral: 3.1 cm

## 2021-05-06 NOTE — Progress Notes (Signed)
I notified Mrs. Chelsea Wise of new arrival time 1100.  I instructed patient to drink Pre- Ensure between  1130 - 1230, nothing else to drink after 1230.

## 2021-05-06 NOTE — Progress Notes (Signed)
  Echocardiogram 2D Echocardiogram has been performed.  Chelsea Wise G Chelsea Wise 05/06/2021, 9:16 AM

## 2021-05-09 ENCOUNTER — Ambulatory Visit (HOSPITAL_COMMUNITY): Payer: Federal, State, Local not specified - PPO | Admitting: Anesthesiology

## 2021-05-09 ENCOUNTER — Encounter (HOSPITAL_COMMUNITY)
Admission: RE | Disposition: A | Discharge status: Home / Self Care | Payer: Self-pay | Source: Home / Self Care | Attending: General Surgery

## 2021-05-09 ENCOUNTER — Encounter: Payer: Self-pay | Admitting: Hematology and Oncology

## 2021-05-09 ENCOUNTER — Other Ambulatory Visit: Payer: Self-pay

## 2021-05-09 ENCOUNTER — Ambulatory Visit (HOSPITAL_COMMUNITY): Payer: Federal, State, Local not specified - PPO

## 2021-05-09 ENCOUNTER — Encounter (HOSPITAL_COMMUNITY): Payer: Self-pay | Admitting: General Surgery

## 2021-05-09 ENCOUNTER — Ambulatory Visit (HOSPITAL_COMMUNITY)
Admission: RE | Admit: 2021-05-09 | Discharge: 2021-05-09 | Disposition: A | Discharge status: Home / Self Care | Payer: Federal, State, Local not specified - PPO | Attending: General Surgery | Admitting: General Surgery

## 2021-05-09 DIAGNOSIS — C50911 Malignant neoplasm of unspecified site of right female breast: Secondary | ICD-10-CM | POA: Diagnosis not present

## 2021-05-09 DIAGNOSIS — Z801 Family history of malignant neoplasm of trachea, bronchus and lung: Secondary | ICD-10-CM | POA: Insufficient documentation

## 2021-05-09 DIAGNOSIS — Z823 Family history of stroke: Secondary | ICD-10-CM | POA: Insufficient documentation

## 2021-05-09 DIAGNOSIS — Z833 Family history of diabetes mellitus: Secondary | ICD-10-CM | POA: Insufficient documentation

## 2021-05-09 DIAGNOSIS — Z800 Family history of malignant neoplasm of digestive organs: Secondary | ICD-10-CM | POA: Insufficient documentation

## 2021-05-09 DIAGNOSIS — Z8042 Family history of malignant neoplasm of prostate: Secondary | ICD-10-CM | POA: Insufficient documentation

## 2021-05-09 DIAGNOSIS — Z419 Encounter for procedure for purposes other than remedying health state, unspecified: Secondary | ICD-10-CM

## 2021-05-09 DIAGNOSIS — Z803 Family history of malignant neoplasm of breast: Secondary | ICD-10-CM | POA: Insufficient documentation

## 2021-05-09 DIAGNOSIS — Z8249 Family history of ischemic heart disease and other diseases of the circulatory system: Secondary | ICD-10-CM | POA: Insufficient documentation

## 2021-05-09 DIAGNOSIS — Z170 Estrogen receptor positive status [ER+]: Secondary | ICD-10-CM | POA: Insufficient documentation

## 2021-05-09 HISTORY — PX: PORTACATH PLACEMENT: SHX2246

## 2021-05-09 HISTORY — DX: Malignant (primary) neoplasm, unspecified: C80.1

## 2021-05-09 LAB — POCT PREGNANCY, URINE: Preg Test, Ur: NEGATIVE

## 2021-05-09 SURGERY — INSERTION, TUNNELED CENTRAL VENOUS DEVICE, WITH PORT
Anesthesia: General | Site: Chest | Laterality: Left

## 2021-05-09 MED ORDER — TRAMADOL HCL 50 MG PO TABS: 100.0000 mg | ORAL_TABLET | Freq: Four times a day (QID) | ORAL | 0 refills | Status: DC | PRN

## 2021-05-09 MED ORDER — AMISULPRIDE (ANTIEMETIC) 5 MG/2ML IV SOLN
10.0000 mg | Freq: Once | INTRAVENOUS | Status: DC | PRN
Start: 2021-05-09 — End: 2021-05-09

## 2021-05-09 MED ORDER — OXYCODONE HCL 5 MG PO TABS: 5.0000 mg | ORAL_TABLET | ORAL | Status: DC | PRN

## 2021-05-09 MED ORDER — LACTATED RINGERS IV SOLN: INTRAVENOUS | Status: DC

## 2021-05-09 MED ORDER — FENTANYL CITRATE (PF) 100 MCG/2ML IJ SOLN: 25.0000 ug | INTRAMUSCULAR | Status: DC | PRN

## 2021-05-09 MED ORDER — MIDAZOLAM HCL 2 MG/2ML IJ SOLN
INTRAMUSCULAR | Status: DC | PRN
  Administered 2021-05-09: 2 mg via INTRAVENOUS

## 2021-05-09 MED ORDER — CHLORHEXIDINE GLUCONATE 0.12 % MT SOLN
15.0000 mL | Freq: Once | OROMUCOSAL | Status: AC
  Administered 2021-05-09: 15 mL via OROMUCOSAL
  Filled 2021-05-09: qty 15

## 2021-05-09 MED ORDER — SODIUM CHLORIDE 0.9 % IV SOLN
INTRAVENOUS | Status: AC
  Filled 2021-05-09: qty 1.2

## 2021-05-09 MED ORDER — ACETAMINOPHEN 325 MG PO TABS: 650.0000 mg | ORAL_TABLET | ORAL | Status: DC | PRN

## 2021-05-09 MED ORDER — ENSURE PRE-SURGERY PO LIQD: 296.0000 mL | Freq: Once | ORAL | Status: DC

## 2021-05-09 MED ORDER — CEFAZOLIN SODIUM-DEXTROSE 2-4 GM/100ML-% IV SOLN
2.0000 g | INTRAVENOUS | Status: AC
  Administered 2021-05-09: 2 g via INTRAVENOUS
  Filled 2021-05-09: qty 100

## 2021-05-09 MED ORDER — EPHEDRINE SULFATE-NACL 50-0.9 MG/10ML-% IV SOSY
PREFILLED_SYRINGE | INTRAVENOUS | Status: DC | PRN
  Administered 2021-05-09 (×2): 10 mg via INTRAVENOUS

## 2021-05-09 MED ORDER — 0.9 % SODIUM CHLORIDE (POUR BTL) OPTIME
TOPICAL | Status: DC | PRN
  Administered 2021-05-09: 1000 mL

## 2021-05-09 MED ORDER — ONDANSETRON HCL 4 MG/2ML IJ SOLN
INTRAMUSCULAR | Status: AC
  Filled 2021-05-09: qty 2

## 2021-05-09 MED ORDER — PROPOFOL 10 MG/ML IV BOLUS
INTRAVENOUS | Status: AC
  Filled 2021-05-09: qty 20

## 2021-05-09 MED ORDER — LIDOCAINE 2% (20 MG/ML) 5 ML SYRINGE
INTRAMUSCULAR | Status: DC | PRN
  Administered 2021-05-09: 60 mg via INTRAVENOUS

## 2021-05-09 MED ORDER — MIDAZOLAM HCL 2 MG/2ML IJ SOLN
INTRAMUSCULAR | Status: AC
  Filled 2021-05-09: qty 2

## 2021-05-09 MED ORDER — OXYCODONE HCL 5 MG PO TABS: 5.0000 mg | ORAL_TABLET | Freq: Once | ORAL | Status: DC | PRN

## 2021-05-09 MED ORDER — ONDANSETRON HCL 4 MG/2ML IJ SOLN
INTRAMUSCULAR | Status: DC | PRN
  Administered 2021-05-09: 4 mg via INTRAVENOUS

## 2021-05-09 MED ORDER — ACETAMINOPHEN 650 MG RE SUPP: 650.0000 mg | RECTAL | Status: DC | PRN

## 2021-05-09 MED ORDER — SODIUM CHLORIDE 0.9% FLUSH: 3.0000 mL | Freq: Two times a day (BID) | INTRAVENOUS | Status: DC

## 2021-05-09 MED ORDER — FENTANYL CITRATE (PF) 250 MCG/5ML IJ SOLN
INTRAMUSCULAR | Status: AC
  Filled 2021-05-09: qty 5

## 2021-05-09 MED ORDER — TRAMADOL HCL 50 MG PO TABS
50.0000 mg | ORAL_TABLET | Freq: Once | ORAL | Status: AC
  Administered 2021-05-09: 50 mg via ORAL

## 2021-05-09 MED ORDER — PROPOFOL 10 MG/ML IV BOLUS
INTRAVENOUS | Status: DC | PRN
  Administered 2021-05-09: 200 mg via INTRAVENOUS

## 2021-05-09 MED ORDER — FENTANYL CITRATE (PF) 250 MCG/5ML IJ SOLN
INTRAMUSCULAR | Status: DC | PRN
  Administered 2021-05-09: 100 ug via INTRAVENOUS

## 2021-05-09 MED ORDER — SODIUM CHLORIDE 0.9 % IV SOLN: 250.0000 mL | INTRAVENOUS | Status: DC | PRN

## 2021-05-09 MED ORDER — HEPARIN SOD (PORK) LOCK FLUSH 100 UNIT/ML IV SOLN
INTRAVENOUS | Status: DC | PRN
  Administered 2021-05-09: 400 [IU] via INTRAVENOUS

## 2021-05-09 MED ORDER — TRAMADOL HCL 50 MG PO TABS
ORAL_TABLET | ORAL | Status: AC
  Filled 2021-05-09: qty 1

## 2021-05-09 MED ORDER — BUPIVACAINE HCL 0.25 % IJ SOLN
INTRAMUSCULAR | Status: DC | PRN
  Administered 2021-05-09: 7 mL

## 2021-05-09 MED ORDER — DEXAMETHASONE SODIUM PHOSPHATE 10 MG/ML IJ SOLN
INTRAMUSCULAR | Status: DC | PRN
  Administered 2021-05-09: 4 mg via INTRAVENOUS

## 2021-05-09 MED ORDER — ONDANSETRON HCL 4 MG/2ML IJ SOLN: 4.0000 mg | Freq: Once | INTRAMUSCULAR | Status: DC | PRN

## 2021-05-09 MED ORDER — ORAL CARE MOUTH RINSE: 15.0000 mL | Freq: Once | OROMUCOSAL | Status: AC

## 2021-05-09 MED ORDER — DEXAMETHASONE SODIUM PHOSPHATE 10 MG/ML IJ SOLN
INTRAMUSCULAR | Status: AC
  Filled 2021-05-09: qty 1

## 2021-05-09 MED ORDER — CELECOXIB 200 MG PO CAPS
400.0000 mg | ORAL_CAPSULE | ORAL | Status: AC
  Administered 2021-05-09: 400 mg via ORAL
  Filled 2021-05-09: qty 2

## 2021-05-09 MED ORDER — BUPIVACAINE HCL (PF) 0.25 % IJ SOLN
INTRAMUSCULAR | Status: AC
  Filled 2021-05-09: qty 30

## 2021-05-09 MED ORDER — HEPARIN SOD (PORK) LOCK FLUSH 100 UNIT/ML IV SOLN
INTRAVENOUS | Status: AC
  Filled 2021-05-09: qty 5

## 2021-05-09 MED ORDER — ACETAMINOPHEN 500 MG PO TABS
1000.0000 mg | ORAL_TABLET | ORAL | Status: AC
  Administered 2021-05-09: 1000 mg via ORAL
  Filled 2021-05-09: qty 2

## 2021-05-09 MED ORDER — OXYCODONE HCL 5 MG/5ML PO SOLN: 5.0000 mg | Freq: Once | ORAL | Status: DC | PRN

## 2021-05-09 MED ORDER — SODIUM CHLORIDE 0.9 % IV SOLN: INTRAVENOUS | Status: DC

## 2021-05-09 MED ORDER — SODIUM CHLORIDE 0.9% FLUSH: 3.0000 mL | INTRAVENOUS | Status: DC | PRN

## 2021-05-09 MED ORDER — SODIUM CHLORIDE 0.9 % IV SOLN: INTRAVENOUS | Status: DC | PRN

## 2021-05-09 SURGICAL SUPPLY — 46 items
ADH SKN CLS APL DERMABOND .7 (GAUZE/BANDAGES/DRESSINGS) ×1
BAG DECANTER FOR FLEXI CONT (MISCELLANEOUS) ×3 IMPLANT
CHLORAPREP W/TINT 10.5 ML (MISCELLANEOUS) ×3 IMPLANT
CLOSURE STERI-STRIP 1/2X4 (GAUZE/BANDAGES/DRESSINGS) ×1
CLSR STERI-STRIP ANTIMIC 1/2X4 (GAUZE/BANDAGES/DRESSINGS) ×2 IMPLANT
COVER SURGICAL LIGHT HANDLE (MISCELLANEOUS) ×3 IMPLANT
COVER TRANSDUCER ULTRASND GEL (DISPOSABLE) ×3 IMPLANT
COVER WAND RF STERILE (DRAPES) ×3 IMPLANT
DECANTER SPIKE VIAL GLASS SM (MISCELLANEOUS) ×3 IMPLANT
DERMABOND ADVANCED (GAUZE/BANDAGES/DRESSINGS) ×2
DERMABOND ADVANCED .7 DNX12 (GAUZE/BANDAGES/DRESSINGS) ×1 IMPLANT
DRAPE C-ARM 42X120 X-RAY (DRAPES) ×3 IMPLANT
DRAPE CHEST BREAST 15X10 FENES (DRAPES) ×3 IMPLANT
DRSG TEGADERM 4X4.75 (GAUZE/BANDAGES/DRESSINGS) ×6 IMPLANT
ELECT CAUTERY BLADE 6.4 (BLADE) ×3 IMPLANT
ELECT REM PT RETURN 9FT ADLT (ELECTROSURGICAL) ×3
ELECTRODE REM PT RTRN 9FT ADLT (ELECTROSURGICAL) ×1 IMPLANT
GAUZE 4X4 16PLY RFD (DISPOSABLE) ×3 IMPLANT
GAUZE SPONGE 4X4 12PLY STRL (GAUZE/BANDAGES/DRESSINGS) ×3 IMPLANT
GEL ULTRASOUND 20GR AQUASONIC (MISCELLANEOUS) ×3 IMPLANT
GLOVE BIO SURGEON STRL SZ7 (GLOVE) ×3 IMPLANT
GLOVE BIOGEL PI IND STRL 7.5 (GLOVE) ×1 IMPLANT
GLOVE BIOGEL PI INDICATOR 7.5 (GLOVE) ×2
GOWN STRL REUS W/ TWL LRG LVL3 (GOWN DISPOSABLE) ×2 IMPLANT
GOWN STRL REUS W/TWL LRG LVL3 (GOWN DISPOSABLE) ×6
INTRODUCER COOK 11FR (CATHETERS) IMPLANT
KIT BASIN OR (CUSTOM PROCEDURE TRAY) ×3 IMPLANT
KIT PORT POWER 8FR ISP CVUE (Port) ×3 IMPLANT
KIT TURNOVER KIT B (KITS) ×3 IMPLANT
NS IRRIG 1000ML POUR BTL (IV SOLUTION) ×3 IMPLANT
PAD ARMBOARD 7.5X6 YLW CONV (MISCELLANEOUS) ×6 IMPLANT
PENCIL BUTTON HOLSTER BLD 10FT (ELECTRODE) ×3 IMPLANT
POSITIONER HEAD DONUT 9IN (MISCELLANEOUS) ×3 IMPLANT
SET INTRODUCER 12FR PACEMAKER (INTRODUCER) IMPLANT
SET SHEATH INTRODUCER 10FR (MISCELLANEOUS) IMPLANT
SHEATH COOK PEEL AWAY SET 9F (SHEATH) IMPLANT
SUT MNCRL AB 4-0 PS2 18 (SUTURE) ×3 IMPLANT
SUT PROLENE 2 0 SH DA (SUTURE) ×3 IMPLANT
SUT SILK 2 0 (SUTURE)
SUT SILK 2-0 18XBRD TIE 12 (SUTURE) IMPLANT
SUT VIC AB 3-0 SH 27 (SUTURE) ×3
SUT VIC AB 3-0 SH 27XBRD (SUTURE) ×1 IMPLANT
SYR 5ML LUER SLIP (SYRINGE) ×3 IMPLANT
TOWEL GREEN STERILE (TOWEL DISPOSABLE) ×3 IMPLANT
TOWEL GREEN STERILE FF (TOWEL DISPOSABLE) ×3 IMPLANT
TRAY LAPAROSCOPIC MC (CUSTOM PROCEDURE TRAY) ×3 IMPLANT

## 2021-05-09 NOTE — Op Note (Signed)
Preoperative diagnosis: Right breast cancer Postoperative diagnosis: Same as above Procedure: Left IJ port placement Surgeon: Dr. Serita Grammes Anesthesia: General  Estimated blood loss:minimal Specimens:none Sponge and needlecount was correct atcompletion Drains: None Disposition recovery stable condition  Indications:39 yof who is otherwise healthy and has family history of bca in maternal aunt, dad with prostate cancer, pgm with pancreatic cancer presents with history of rigbht breast mass since April of last year. this has gotten larger. she has no dc. on mm/us she has a 2.5 cm upper pole right breast mass with multiple other irregular masses from this measuring 7x5cm. there is also 1.4 cm suberaolar area. on [place] there is 4.5x1.6x2.8 cm mass. there are 2 abnormal ax nodes presents. biopsy of breast at 1 and 11 oclock are both grade II IDC with DCIS that is 80% er pos, 1% pr pos, her 2 fish negative and Ki 25%. MRI showed c density breasts.  Right breast with 4.9x2.6x5.7 cmm mass present abutting the pec.  The left is negative.  There are two enlarged right ax nodes.   Genetics is negative.  We decided to do mammaprint on tumor to confirm she needs chemotherapy. This is high risk.  We discussed proceeding with port placement.   Procedure: After informed consent was obtainedshe was taken to the OR.She was given antibiotics. SCDs were placed. She was placed under general anesthesia without complication. She was prepped and draped in the standard sterile surgical fashion. A surgical timeout was then performed.  I used the ultrasound to identify theleftinternal jugular vein. Under ultrasound guidance I then accessed the vein with the needle.  I accessed this on the first pass.  I placed the wire and this was in good position by fluoroscopy.  I thenmade an incisionon herleftchest.I tunneled the line between the 2 sites. I then placed the dilator over the wire. I observed  this with fluoroscopy to go in the correct position. I then removedthe wire. I then passed the line. The peel-away sheath was removed. I pulled the line back to be in the superior vena cava.The tip of the line is in the superior vena cavanear the cavoatrial junction.I then attached the port. I sutured this into place with 2-0 Prolene. I then closed this with 3-0 Vicryl and 4-0 Monocryl. Glue was placed. Final fluoroscopic image showed the port to be in good position. I then accessed the port and was able to aspirate blood and packed this with heparin.I placed a dressing and left accessed to begin chemotherapy tomorrow.She tolerated well, was transferred to recovery stable.

## 2021-05-09 NOTE — Progress Notes (Signed)
Report given to Dominique RN

## 2021-05-09 NOTE — Anesthesia Procedure Notes (Signed)
Procedure Name: LMA Insertion Date/Time: 05/09/2021 2:36 PM Performed by: Renato Shin, CRNA Pre-anesthesia Checklist: Patient identified, Emergency Drugs available, Suction available and Patient being monitored Patient Re-evaluated:Patient Re-evaluated prior to induction Oxygen Delivery Method: Circle system utilized Preoxygenation: Pre-oxygenation with 100% oxygen Induction Type: IV induction LMA: LMA inserted LMA Size: 4.0 Placement Confirmation: positive ETCO2 and breath sounds checked- equal and bilateral Tube secured with: Tape Dental Injury: Teeth and Oropharynx as per pre-operative assessment

## 2021-05-09 NOTE — Anesthesia Preprocedure Evaluation (Signed)
Anesthesia Evaluation  Patient identified by MRN, date of birth, ID band Patient awake    Reviewed: Allergy & Precautions, NPO status , Patient's Chart, lab work & pertinent test results  History of Anesthesia Complications Negative for: history of anesthetic complications  Airway Mallampati: II  TM Distance: >3 FB Neck ROM: Full    Dental  (+) Partial Upper, Dental Advisory Given   Pulmonary neg pulmonary ROS,    Pulmonary exam normal        Cardiovascular negative cardio ROS Normal cardiovascular exam     Neuro/Psych negative neurological ROS     GI/Hepatic negative GI ROS, Neg liver ROS,   Endo/Other  negative endocrine ROS  Renal/GU negative Renal ROS  negative genitourinary   Musculoskeletal negative musculoskeletal ROS (+)   Abdominal   Peds  Hematology negative hematology ROS (+)   Anesthesia Other Findings  Breast cancer  Reproductive/Obstetrics negative OB ROS                             Anesthesia Physical Anesthesia Plan  ASA: II  Anesthesia Plan: General   Post-op Pain Management:    Induction: Intravenous  PONV Risk Score and Plan: 3 and Ondansetron, Dexamethasone, Midazolam and Treatment may vary due to age or medical condition  Airway Management Planned: LMA  Additional Equipment: None  Intra-op Plan:   Post-operative Plan: Extubation in OR  Informed Consent: I have reviewed the patients History and Physical, chart, labs and discussed the procedure including the risks, benefits and alternatives for the proposed anesthesia with the patient or authorized representative who has indicated his/her understanding and acceptance.     Dental advisory given  Plan Discussed with:   Anesthesia Plan Comments:         Anesthesia Quick Evaluation

## 2021-05-09 NOTE — H&P (Signed)
Chelsea Wise is an 40 y.o. female.   Chief Complaint: breast cancer HPI:  15 yof who is otherwise healthy and has family history of bca in maternal aunt, dad with prostate cancer, pgm with pancreatic cancer presents with history of rigbht breast mass since April of last year. this has gotten larger. she has no dc. on mm/us she has a 2.5 cm upper pole right breast mass with multiple other irregular masses from this measuring 7x5cm. there is also 1.4 cm suberaolar area. on [place] there is 4.5x1.6x2.8 cm mass. there are 2 abnormal ax nodes presents. biopsy of breast at 1 and 11 oclock are both grade II IDC with DCIS that is 80% er pos, 1% pr pos, her 2 fish negative and Ki 25%. MRI showed c density breasts.  Right breast with 4.9x2.6x5.7 cmm mass present abutting the pec.  The left is negative.  There are two enlarged right ax nodes.   Genetics is negative.  We decided to do mammaprint on tumor to confirm she needs chemotherapy. This is high risk.     Past Medical History:  Diagnosis Date  . Cancer ([place])    Breast  . Family history of breast cancer   . Family history of lung cancer   . Family history of pancreatic cancer   . Family history of prostate cancer   . Medical history non-contributory   . Placenta previa   . SVD (spontaneous vaginal delivery) 01/18/2014    Past Surgical History:  Procedure Laterality Date  . NO PAST SURGERIES    . WISDOM TOOTH EXTRACTION      Family History  Problem Relation Age of Onset  . Prostate cancer Maternal Grandfather 38  . Hypertension Father   . Prostate cancer Father 28  . Diabetes Maternal Aunt   . Hypertension Maternal Aunt   . Diabetes Maternal Uncle   . Hypertension Maternal Uncle   . Pancreatic cancer Paternal Grandmother        dx 32s  . Stroke Paternal Grandfather   . Breast cancer Maternal Aunt 49  . Lung cancer Maternal Aunt 78  . Lung cancer Maternal Aunt        smoker  . Prostate cancer Paternal Uncle        dx 68s  .  Prostate cancer Other        paternal great-uncle (PGM's brother)  . Lung cancer Maternal Aunt 49       smoker  . Lung cancer Maternal Uncle 34       smoker   Social History:  reports that she has never smoked. She has never used smokeless tobacco. She reports that she does not drink alcohol and does not use drugs.  Allergies: No Known Allergies  No medications prior to admission.    No results found for this or any previous visit (from the past 48 hour(s)). No results found.  Review of Systems  Last menstrual period 04/11/2021, unknown if currently breastfeeding. Physical Exam  General Mental Status-Alert. Orientation-Oriented X3. Breast Nipples-No Discharge. Note: no left breast mass right upper pole breast mass measuring 2.5 cm mobile nontender with multiple other nodules laterally in a ray from the larger one Lymphatic Head & Neck General Head & Neck Lymphatics: Bilateral - Description - Normal. Axillary General Axillary Region: Bilateral - Description - Normal. Note: no Morton adenopathy   Assessment/Plan BREAST CANCER METASTASIZED TO AXILLARY LYMPH NODE, RIGHT (C50.911) I think this tumor due to her size and tumor size will be  difficult to resect right now.  Primary endocrine therapy not really a good option in a 40 year old.  We did do a mammaprint which was high risk to confirm with oncology that she needs chemotherapy.  The pr was certainly concerning.  I think reasonable although doubt a complete response to do primary systemic therapy first followed by surgery. Will plan for port placement today   Rolm Bookbinder, MD 05/09/2021, 6:32 AM

## 2021-05-09 NOTE — Discharge Instructions (Signed)
    PORT-A-CATH: POST OP INSTRUCTIONS  Always review your discharge instruction sheet given to you by the facility where your surgery was performed.   1. A prescription for pain medication may be given to you upon discharge. Take your pain medication as prescribed, if needed. If narcotic pain medicine is not needed, then you make take acetaminophen (Tylenol) or ibuprofen (Advil) as needed.  2. Take your usually prescribed medications unless otherwise directed. 3. If you need a refill on your pain medication, please contact our office. All narcotic pain medicine now requires a paper prescription.  Phoned in and fax refills are no longer allowed by law.  Prescriptions will not be filled after 5 pm or on weekends.  4. You should follow a light diet for the remainder of the day after your procedure. 5. Most patients will experience some mild swelling and/or bruising in the area of the incision. It may take several days to resolve. 6. It is common to experience some constipation if taking pain medication after surgery. Increasing fluid intake and taking a stool softener (such as Colace) will usually help or prevent this problem from occurring. A mild laxative (Milk of Magnesia or Miralax) should be taken according to package directions if there are no bowel movements after 48 hours.  7. Unless discharge instructions indicate otherwise, you may remove your bandages 48 hours after surgery, and you may shower at that time. You may have steri-strips (small white skin tapes) in place directly over the incision.  These strips should be left on the skin for 7-10 days.  If your surgeon used Dermabond (skin glue) on the incision, you may shower in 24 hours.  The glue will flake off over the next 2-3 weeks.  8. If your port is left accessed at the end of surgery (needle left in port), the dressing cannot get wet and should only by changed by a healthcare professional. When the port is no longer accessed (when the  needle has been removed), follow step 7.   9. ACTIVITIES:  Limit activity involving your arms for the next 72 hours. Do no strenuous exercise or activity for 1 week. You may drive when you are no longer taking prescription pain medication, you can comfortably wear a seatbelt, and you can maneuver your car. 10.You may need to see your doctor in the office for a follow-up appointment.  Please       check with your doctor.  11.When you receive a new Port-a-Cath, you will get a product guide and        ID card.  Please keep them in case you need them.  WHEN TO CALL YOUR DOCTOR ([phone]): 1. Fever over 101.0 2. Chills 3. Continued bleeding from incision 4. Increased redness and tenderness at the site 5. Shortness of breath, difficulty breathing   The clinic staff is available to answer your questions during regular business hours. Please don't hesitate to call and ask to speak to one of the nurses or medical assistants for clinical concerns. If you have a medical emergency, go to the nearest emergency room or call 911.  A surgeon from [facility] is always on call at the hospital.     For further information, please visit [email or web address]      

## 2021-05-09 NOTE — Anesthesia Postprocedure Evaluation (Signed)
Anesthesia Post Note  Patient: Chelsea Wise  Procedure(s) Performed: INSERTION PORT-A-CATH (N/A Chest)     Patient location during evaluation: PACU Anesthesia Type: General Level of consciousness: awake and alert Pain management: pain level controlled Vital Signs Assessment: post-procedure vital signs reviewed and stable Respiratory status: spontaneous breathing, nonlabored ventilation and respiratory function stable Cardiovascular status: blood pressure returned to baseline and stable Postop Assessment: no apparent nausea or vomiting Anesthetic complications: no   No complications documented.  Last Vitals:  Vitals:   05/09/21 1545 05/09/21 1600  BP: 127/79 131/84  Pulse: 68 63  Resp: 15 18  Temp:    SpO2: 100% 100%    Last Pain:  Vitals:   05/09/21 1530  TempSrc:   PainSc: [place]

## 2021-05-09 NOTE — Transfer of Care (Signed)
Immediate Anesthesia Transfer of Care Note  Patient: Chelsea Wise  Procedure(s) Performed: INSERTION PORT-A-CATH (N/A Chest)  Patient Location: PACU  Anesthesia Type:General  Level of Consciousness: awake and patient cooperative  Airway & Oxygen Therapy: Patient Spontanous Breathing  Post-op Assessment: Report given to RN and Post -op Vital signs reviewed and stable  Post vital signs: Reviewed and stable  Last Vitals:  Vitals Value Taken Time  BP 120/68 05/09/21 1531  Temp    Pulse 78 05/09/21 1532  Resp 18 05/09/21 1532  SpO2 100 % 05/09/21 1532  Vitals shown include unvalidated device data.  Last Pain:  Vitals:   05/09/21 1241  TempSrc:   PainSc: 5          Complications: No complications documented.

## 2021-05-09 NOTE — Assessment & Plan Note (Signed)
04/08/2021:Palpable right breast mass: Mammogram and US showed a 4.5cm right breast mass at the 12-1 o'clock position with associated microcalcifications, multiple additional masses from the 9-11 o'clock position in the right breast, calcifications in the subareolar right breast, 1.4cm, and two enlarged right axillary lymph nodes. 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  Neoadjuvant chemotherapy with dose dense Adriamycin and Cytoxan x4 followed by Taxol weekly x12 2. mastectomy with targeted node dissection 3.  Adjuvant radiation 4.  Followed by adjuvant antiestrogen therapy -------------------------------------------------------------------------------------------------------------------- Current treatment: Cycle 1 day 1 dose dense Adriamycin Cytoxan Antiemetics were reviewed Chemotherapy consent obtained Chemotherapy education completed Echocardiogram 05/06/21: EF 63% Closely monitoring for chemotherapy toxicities. Return to clinic in one week for toxicity check    Return to clinic to start chemotherapy.

## 2021-05-09 NOTE — Progress Notes (Signed)
Patient Care Team: Patient, [name] (Inactive) as PCP - General (General Practice) Pershing Proud, RN as Oncology Nurse Navigator Donnelly Angelica, RN as Oncology Nurse Navigator  DIAGNOSIS:    ICD-10-CM   1. Malignant neoplasm of upper-inner quadrant of right breast in female, estrogen receptor positive (HCC)  C50.211    Z17.0     SUMMARY OF ONCOLOGIC HISTORY: Oncology History  Malignant neoplasm of upper-inner quadrant of right breast in female, estrogen receptor positive (HCC)  04/08/2021 Initial Diagnosis   Palpable right breast mass: Mammogram and US showed a 4.5cm right breast mass at the 12-1 o'clock position with associated microcalcifications, multiple additional masses from the 9-11 o'clock position in the right breast, calcifications in the subareolar right breast, 1.4cm, and two enlarged right axillary lymph nodes. 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 Two variants of uncertain significance (VUS) were detected - one in the APC gene called p.T2422I (c.7265C>T) and a second in the BAP1 gene called p.P302L (c.905C>T). The report dates are 04/19/2021 and 04/25/2021, respectively.  The BRCAplus panel offered by [employer] and includes sequencing and deletion/duplication analysis for the following 8 genes: ATM, BRCA1, BRCA2, CDH1, CHEK2, PALB2, PTEN, and TP53. The CancerNext-Expanded + RNAinsight gene panel offered by [employer] and includes sequencing and  rearrangement analysis for the following 77 genes: AIP, ALK, APC, ATM, AXIN2, BAP1, BARD1, BLM, BMPR1A, BRCA1, BRCA2, BRIP1, CDC73, CDH1, CDK4, CDKN1B, CDKN2A, CHEK2, CTNNA1, DICER1, FANCC, FH, FLCN, GALNT12, KIF1B, LZTR1, MAX, MEN1, MET, MLH1, MSH2, MSH3, MSH6, MUTYH, NBN, NF1, NF2, NTHL1, PALB2, PHOX2B, PMS2, POT1, PRKAR1A, PTCH1, PTEN, RAD51C, RAD51D, RB1, RECQL, RET, SDHA, SDHAF2, SDHB, SDHC, SDHD, SMAD4, SMARCA4, SMARCB1, SMARCE1, STK11, SUFU, TMEM127, TP53, TSC1, TSC2, VHL and XRCC2 (sequencing and deletion/duplication); EGFR, EGLN1, HOXB13, KIT, MITF, PDGFRA, POLD1 and POLE (sequencing only); EPCAM and GREM1 (deletion/duplication only). RNA data is routinely analyzed for use in variant interpretation for all genes.    05/10/2021 -  Chemotherapy    Patient is on Treatment Plan: BREAST ADJUVANT DOSE DENSE AC Q14D / PACLITAXEL Q7D        CHIEF COMPLIANT: Cycle 1 Adriamycin and Cytoxan  INTERVAL HISTORY: Chelsea Wise is a 40 y.o. with above-mentioned history of right breast cancer currently on neoadjuvant chemotherapy with dose dense Adriamycin and Cytoxan. Echo on 05/06/21 showed an ejection fraction of 63%. Her port was placed by Dr. Dwain Sarna on 05/09/21. She presents to the clinic today for cycle 1.  She is slightly sore from port placement yesterday.  ALLERGIES:  has No Known Allergies.  MEDICATIONS:  Current Outpatient Medications  Medication Sig Dispense Refill  . acetaminophen (TYLENOL) 500 MG tablet Take 500-1,000 mg by mouth every 6 (six) hours as needed for moderate pain.    . Ascorbic Acid (VITAMIN C PO) Take 1 tablet by mouth daily.    . cetirizine (ZYRTEC) 10 MG tablet Take 10 mg by mouth daily as needed for allergies.    Marland Kitchen dexamethasone (DECADRON) 4 MG tablet  Take 1 tablet (4 mg total) by mouth daily. Take 1 tablet day after chemo and 1 tablet 2 days after chemo with food (Patient taking differently: Take 4 mg by mouth See admin instructions. Take 4 mg day after chemo and 1  tablet 2 days after chemo with food) 8 tablet 0  . ELDERBERRY PO Take 1 capsule by mouth daily.    . hydrocortisone cream 1 % Apply 1 application topically 2 (two) times daily as needed for itching.    . lidocaine-prilocaine (EMLA) cream Apply to affected area once 30 g 3  . Multiple Vitamin (MULTIVITAMIN WITH MINERALS) TABS tablet Take 2 tablets by mouth daily.    . Multiple Vitamins-Minerals (ZINC PO) Take 1 tablet by mouth once a week.    . ondansetron (ZOFRAN) 8 MG tablet Take 1 tablet (8 mg total) by mouth 2 (two) times daily as needed. Start on the third day after chemotherapy. 30 tablet 1  . prochlorperazine (COMPAZINE) 10 MG tablet Take 1 tablet (10 mg total) by mouth every 6 (six) hours as needed (Nausea or vomiting). 30 tablet 1  . sodium chloride (OCEAN) 0.65 % SOLN nasal spray Place 1 spray into both nostrils as needed for congestion.    . traMADol (ULTRAM) 50 MG tablet Take 2 tablets (100 mg total) by mouth every 6 (six) hours as needed. 10 tablet 0   No current facility-administered medications for this visit.    PHYSICAL EXAMINATION: ECOG PERFORMANCE STATUS: 1 - Symptomatic but completely ambulatory  Vitals:   05/10/21 0852  BP: 135/74  Pulse: 60  Resp: 18  Temp: 97.6 F (36.4 C)  SpO2: 100%   Filed Weights   05/10/21 0852  Weight: 132 lb 9.6 oz (60.1 kg)    LABORATORY DATA:  I have reviewed the data as listed CMP Latest Ref Rng & Units 04/13/2021 03/20/2013  Glucose 70 - 99 mg/dL 92 84  BUN 6 - 20 mg/dL 9 7  Creatinine 0.44 - 1.00 mg/dL 0.77 0.78  Sodium 135 - 145 mmol/L 139 136  Potassium 3.5 - 5.1 mmol/L 3.6 3.8  Chloride 98 - 111 mmol/L 103 102  CO2 22 - 32 mmol/L 28 25  Calcium 8.9 - 10.3 mg/dL 9.4 9.1  Total Protein 6.5 - 8.1 g/dL 7.5 -  Total Bilirubin 0.3 - 1.2 mg/dL 0.4 -  Alkaline Phos 38 - 126 U/L 45 -  AST 15 - 41 U/L 20 -  ALT 0 - 44 U/L 10 -    Lab Results  Component Value Date   WBC 5.7 04/13/2021   HGB 13.0 04/13/2021   HCT 37.5  04/13/2021   MCV 89.7 04/13/2021   PLT 290 04/13/2021   NEUTROABS 3.2 04/13/2021    ASSESSMENT & PLAN:  Malignant neoplasm of upper-inner quadrant of right breast in female, estrogen receptor positive ([place]) 04/08/2021:Palpable right breast mass: Mammogram and US showed a 4.5cm right breast mass at the 12-1 o'clock position with associated microcalcifications, multiple additional masses from the 9-11 o'clock position in the right breast, calcifications in the subareolar right breast, 1.4cm, and two enlarged right axillary lymph nodes. 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  Neoadjuvant chemotherapy with dose dense Adriamycin and Cytoxan x4 followed by  Taxol weekly x12 2. mastectomy with targeted node dissection 3.  Adjuvant radiation 4.  Followed by adjuvant antiestrogen therapy -------------------------------------------------------------------------------------------------------------------- Current treatment: Cycle 1 day 1 dose dense Adriamycin Cytoxan Antiemetics were reviewed Chemotherapy consent obtained Chemotherapy education completed Echocardiogram 05/06/21: EF 63% Closely monitoring for chemotherapy toxicities. Return to clinic in one week for toxicity check  No orders of the defined types were placed in this encounter.  The patient has a good understanding of the overall plan. she agrees with it. she will call with any problems that may develop before the next visit here.  Total time spent: 30 mins including face to face time and time spent for planning, charting and coordination of care  Rulon Eisenmenger, MD, MPH 05/10/2021  I, Cloyde Reams Dorshimer, am acting as scribe for Dr. Nicholas Lose.  I have reviewed the above  documentation for accuracy and completeness, and I agree with the above.

## 2021-05-09 NOTE — Interval H&P Note (Signed)
History and Physical Interval Note:  05/09/2021 1:53 PM  Chelsea Wise  has presented today for surgery, with the diagnosis of BREAST CANCER.  The various methods of treatment have been discussed with the patient and family. After consideration of risks, benefits and other options for treatment, the patient has consented to  Procedure(s): INSERTION PORT-A-CATH (N/A) as a surgical intervention.  The patient's history has been reviewed, patient examined, no change in status, stable for surgery.  I have reviewed the patient's chart and labs.  Questions were answered to the patient's satisfaction.     Rolm Bookbinder

## 2021-05-10 ENCOUNTER — Inpatient Hospital Stay: Payer: Federal, State, Local not specified - PPO

## 2021-05-10 ENCOUNTER — Inpatient Hospital Stay: Payer: Federal, State, Local not specified - PPO | Admitting: Hematology and Oncology

## 2021-05-10 ENCOUNTER — Encounter: Payer: Self-pay | Admitting: *Deleted

## 2021-05-10 ENCOUNTER — Encounter (HOSPITAL_COMMUNITY): Payer: Self-pay | Admitting: General Surgery

## 2021-05-10 ENCOUNTER — Encounter: Payer: Self-pay | Admitting: Hematology and Oncology

## 2021-05-10 DIAGNOSIS — C50211 Malignant neoplasm of upper-inner quadrant of right female breast: Secondary | ICD-10-CM

## 2021-05-10 DIAGNOSIS — Z170 Estrogen receptor positive status [ER+]: Secondary | ICD-10-CM

## 2021-05-10 DIAGNOSIS — Z5111 Encounter for antineoplastic chemotherapy: Secondary | ICD-10-CM | POA: Diagnosis not present

## 2021-05-10 DIAGNOSIS — Z5189 Encounter for other specified aftercare: Secondary | ICD-10-CM | POA: Diagnosis not present

## 2021-05-10 DIAGNOSIS — Z95828 Presence of other vascular implants and grafts: Secondary | ICD-10-CM

## 2021-05-10 DIAGNOSIS — C773 Secondary and unspecified malignant neoplasm of axilla and upper limb lymph nodes: Secondary | ICD-10-CM | POA: Diagnosis present

## 2021-05-10 LAB — CBC WITH DIFFERENTIAL (CANCER CENTER ONLY)
Abs Immature Granulocytes: 0.03 10*3/uL (ref 0.00–0.07)
Basophils Absolute: 0 10*3/uL (ref 0.0–0.1)
Basophils Relative: 0 %
Eosinophils Absolute: 0 10*3/uL (ref 0.0–0.5)
Eosinophils Relative: 0 %
HCT: 34.5 % — ABNORMAL LOW (ref 36.0–46.0)
Hemoglobin: 12 g/dL (ref 12.0–15.0)
Immature Granulocytes: 0 %
Lymphocytes Relative: 22 %
Lymphs Abs: 2.2 10*3/uL (ref 0.7–4.0)
MCH: 30.8 pg (ref 26.0–34.0)
MCHC: 34.8 g/dL (ref 30.0–36.0)
MCV: 88.7 fL (ref 80.0–100.0)
Monocytes Absolute: 0.7 10*3/uL (ref 0.1–1.0)
Monocytes Relative: 7 %
Neutro Abs: 6.7 10*3/uL (ref 1.7–7.7)
Neutrophils Relative %: 71 %
Platelet Count: 235 10*3/uL (ref 150–400)
RBC: 3.89 MIL/uL (ref 3.87–5.11)
RDW: 12.7 % (ref 11.5–15.5)
WBC Count: 9.6 10*3/uL (ref 4.0–10.5)
nRBC: 0 % (ref 0.0–0.2)

## 2021-05-10 LAB — CMP (CANCER CENTER ONLY)
ALT: 10 U/L (ref 0–44)
AST: 16 U/L (ref 15–41)
Albumin: 3.9 g/dL (ref 3.5–5.0)
Alkaline Phosphatase: 40 U/L (ref 38–126)
Anion gap: 11 (ref 5–15)
BUN: 7 mg/dL (ref 6–20)
CO2: 23 mmol/L (ref 22–32)
Calcium: 9.4 mg/dL (ref 8.9–10.3)
Chloride: 105 mmol/L (ref 98–111)
Creatinine: 0.69 mg/dL (ref 0.44–1.00)
GFR, Estimated: >60 mL/min (ref >60)
Glucose, Bld: 83 mg/dL (ref 70–99)
Potassium: 3.8 mmol/L (ref 3.5–5.1)
Sodium: 139 mmol/L (ref 135–145)
Total Bilirubin: <0.2 mg/dL — ABNORMAL LOW (ref 0.3–1.2)
Total Protein: 7 g/dL (ref 6.5–8.1)

## 2021-05-10 MED ORDER — PALONOSETRON HCL INJECTION 0.25 MG/5ML
INTRAVENOUS | Status: AC
  Filled 2021-05-10: qty 5

## 2021-05-10 MED ORDER — DOXORUBICIN HCL CHEMO IV INJECTION 2 MG/ML
60.0000 mg/m2 | Freq: Once | INTRAVENOUS | Status: AC
  Administered 2021-05-10: 98 mg via INTRAVENOUS
  Filled 2021-05-10: qty 49

## 2021-05-10 MED ORDER — SODIUM CHLORIDE 0.9 % IV SOLN
150.0000 mg | Freq: Once | INTRAVENOUS | Status: AC
  Administered 2021-05-10: 150 mg via INTRAVENOUS
  Filled 2021-05-10: qty 150

## 2021-05-10 MED ORDER — PALONOSETRON HCL INJECTION 0.25 MG/5ML
0.2500 mg | Freq: Once | INTRAVENOUS | Status: AC
  Administered 2021-05-10: 0.25 mg via INTRAVENOUS

## 2021-05-10 MED ORDER — SODIUM CHLORIDE 0.9% FLUSH
10.0000 mL | INTRAVENOUS | Status: DC | PRN
  Administered 2021-05-10: 10 mL via INTRAVENOUS
  Filled 2021-05-10: qty 10

## 2021-05-10 MED ORDER — SODIUM CHLORIDE 0.9 % IV SOLN
10.0000 mg | Freq: Once | INTRAVENOUS | Status: AC
  Administered 2021-05-10: 10 mg via INTRAVENOUS
  Filled 2021-05-10: qty 10

## 2021-05-10 MED ORDER — SODIUM CHLORIDE 0.9 % IV SOLN
600.0000 mg/m2 | Freq: Once | INTRAVENOUS | Status: AC
  Administered 2021-05-10: 980 mg via INTRAVENOUS
  Filled 2021-05-10: qty 49

## 2021-05-10 MED ORDER — SODIUM CHLORIDE 0.9 % IV SOLN
Freq: Once | INTRAVENOUS | Status: AC
Start: 2021-05-10 — End: 2021-05-10
  Filled 2021-05-10: qty 250

## 2021-05-10 NOTE — Patient Instructions (Signed)
[facility]  Discharge Instructions: Thank you for choosing [facility] to provide your oncology and hematology care.   If you have a lab appointment with the [facility], please go directly to the [facility] and check in at the registration area.   Wear comfortable clothing and clothing appropriate for easy access to any Portacath or PICC line.   We strive to give you quality time with your provider. You may need to reschedule your appointment if you arrive late (15 or more minutes).  Arriving late affects you and other patients whose appointments are after yours.  Also, if you miss three or more appointments without notifying the office, you may be dismissed from the clinic at the provider's discretion.      For prescription refill requests, have your pharmacy contact our office and allow 72 hours for refills to be completed.    Today you received the following chemotherapy and/or immunotherapy agents   DOXOrubicin (ADRIAMYCIN)  cyclophosphamide (CYTOXAN)    To help prevent nausea and vomiting after your treatment, we encourage you to take your nausea medication as directed.  BELOW ARE SYMPTOMS THAT SHOULD BE REPORTED IMMEDIATELY: . *FEVER GREATER THAN 100.4 F (38 C) OR HIGHER . *CHILLS OR SWEATING . *NAUSEA AND VOMITING THAT IS NOT CONTROLLED WITH YOUR NAUSEA MEDICATION . *UNUSUAL SHORTNESS OF BREATH . *UNUSUAL BRUISING OR BLEEDING . *URINARY PROBLEMS (pain or burning when urinating, or frequent urination) . *BOWEL PROBLEMS (unusual diarrhea, constipation, pain near the anus) . TENDERNESS IN MOUTH AND THROAT WITH OR WITHOUT PRESENCE OF ULCERS (sore throat, sores in mouth, or a toothache) . UNUSUAL RASH, SWELLING OR PAIN  . UNUSUAL VAGINAL DISCHARGE OR ITCHING   Items with * indicate a potential emergency and should be followed up as soon as possible or go to the Emergency Department if any problems should occur.  Please show the  CHEMOTHERAPY ALERT CARD or IMMUNOTHERAPY ALERT CARD at check-in to the Emergency Department and triage nurse.  Should you have questions after your visit or need to cancel or reschedule your appointment, please contact [facility]  Dept: [phone]  and follow the prompts.  Office hours are 8:00 a.m. to 4:30 p.m. Monday - Friday. Please note that voicemails left after 4:00 p.m. may not be returned until the following business day.  We are closed weekends and major holidays. You have access to a nurse at all times for urgent questions. Please call the main number to the clinic Dept: [phone] and follow the prompts.   For any non-urgent questions, you may also contact your provider using MyChart. We now offer e-Visits for anyone 16 and older to request care online for non-urgent symptoms. For details visit [email or web address].   Also download the MyChart app! Go to the app store, search "MyChart", open the app, select [facility], and log in with your MyChart username and password.  Due to Covid, a mask is required upon entering the hospital/clinic. If you do not have a mask, one will be given to you upon arrival. For doctor visits, patients may have 1 support person aged 35 or older with them. For treatment visits, patients cannot have anyone with them due to current Covid guidelines and our immunocompromised population.

## 2021-05-10 NOTE — Progress Notes (Signed)
Met w/ ptto introduce myself as her Arboriculturist, discuss copay assistance and the [employer].  Pt would like to applyand gave me consent to apply in her behalf so I completed theonlineapplicationwith the[employer] foundation for [employer]. The app is pending so I will notify herof the outcome once I receive it.  I also informed her of the [employer], went over what it covers and gave her the income requirement.  Pt stated she exceeds the income requirement so she doesn't qualify for the grant at this time.  I gave her my card for any questions or concernsshe may have in the future.

## 2021-05-10 NOTE — Research (Signed)
Trial: DCP-001: Use of a Clinical Trial Screening Tool to Address Cancer Health Disparities in the [facility] [place])   Patient Chelsea Wise was identified by Research officer, political party as a potential candidate for the above listed study.  This Clinical Research Nurse met with Chelsea Wise, [id number], on 05/10/21 in a manner and location that ensures patient privacy to discuss participation in the above listed research study.  Patient is Unaccompanied.  A copy of the informed consent document and separate HIPAA Authorization was provided to the patient.  Patient reads, speaks, and understands [place].    As outlined in the informed consent form, this Nurse and Chelsea Wise discussed the purpose of the research study, the investigational nature of the study, study procedures and requirements for study participation, potential risks and benefits of study participation, as well as alternatives to participation. The patient understands participation is voluntary and they may withdraw from study participation at any time.    Confidentiality and how the patient's information will be used as part of study participation were discussed.  Patient was informed there is not reimbursement provided for their time and effort spent on trial participation.   All questions were answered to patient's satisfaction.  The informed consent and separate HIPAA Authorization was reviewed page by page.  The patient's mental and emotional status is appropriate to provide informed consent, and the patient verbalizes an understanding of study participation.  Patient has agreed to participate in the above listed research study and has voluntarily signed the informed consent protocol version date 05/27/2019 and separate HIPAA Authorization, version date approved 12/27/2020 on 05/10/21 at 10:13 AM.  The patient was provided with a copy of the signed informed consent form and separate HIPAA  Authorization for their reference.  No study specific procedures were obtained prior to the signing of the informed consent document.  Approximately 15 minutes were spent with the patient reviewing the informed consent documents.    After signing consent, patient was asked demographic information for the study that cannot be found in her medical record. Patient was interviewed for 5 minutes and answered all questions without difficulty. Informed patient that the rest of the study required information will be extracted from her medical record. Thanked patient for her participation in this study today. Patient meets all eligibility criteria and will be enrolled on this study.   Foye Spurling, BSN, RN Clinical Research Nurse 05/10/2021 11:24 AM

## 2021-05-11 ENCOUNTER — Telehealth: Payer: Self-pay | Admitting: *Deleted

## 2021-05-12 ENCOUNTER — Encounter: Payer: Self-pay | Admitting: Hematology and Oncology

## 2021-05-12 ENCOUNTER — Inpatient Hospital Stay: Payer: Federal, State, Local not specified - PPO

## 2021-05-12 ENCOUNTER — Other Ambulatory Visit: Payer: Self-pay

## 2021-05-12 VITALS — BP 127/75 | HR 63 | Temp 98.8°F | Resp 16

## 2021-05-12 DIAGNOSIS — Z170 Estrogen receptor positive status [ER+]: Secondary | ICD-10-CM

## 2021-05-12 DIAGNOSIS — Z5111 Encounter for antineoplastic chemotherapy: Secondary | ICD-10-CM | POA: Diagnosis not present

## 2021-05-12 MED ORDER — PEGFILGRASTIM-JMDB 6 MG/0.6ML ~~LOC~~ SOSY
6.0000 mg | PREFILLED_SYRINGE | Freq: Once | SUBCUTANEOUS | Status: AC
  Administered 2021-05-12: 6 mg via SUBCUTANEOUS

## 2021-05-12 MED ORDER — PEGFILGRASTIM-JMDB 6 MG/0.6ML ~~LOC~~ SOSY
PREFILLED_SYRINGE | SUBCUTANEOUS | Status: AC
  Filled 2021-05-12: qty 0.6

## 2021-05-12 NOTE — Patient Instructions (Signed)
Pegfilgrastim injection What is this medicine? PEGFILGRASTIM (PEG fil gra stim) is a long-acting granulocyte colony-stimulating factor that stimulates the growth of neutrophils, a type of white blood cell important in the body's fight against infection. It is used to reduce the incidence of fever and infection in patients with certain types of cancer who are receiving chemotherapy that affects the bone marrow, and to increase survival after being exposed to high doses of radiation. This medicine may be used for other purposes; ask your health care provider or pharmacist if you have questions. COMMON BRAND NAME(S): Fulphila, Neulasta, Nyvepria, UDENYCA, Ziextenzo What should I tell my health care provider before I take this medicine? They need to know if you have any of these conditions:  kidney disease  latex allergy  ongoing radiation therapy  sickle cell disease  skin reactions to acrylic adhesives (On-Body Injector only)  an unusual or allergic reaction to pegfilgrastim, filgrastim, other medicines, foods, dyes, or preservatives  pregnant or trying to get pregnant  breast-feeding How should I use this medicine? This medicine is for injection under the skin. If you get this medicine at home, you will be taught how to prepare and give the pre-filled syringe or how to use the On-body Injector. Refer to the patient Instructions for Use for detailed instructions. Use exactly as directed. Tell your healthcare provider immediately if you suspect that the On-body Injector may not have performed as intended or if you suspect the use of the On-body Injector resulted in a missed or partial dose. It is important that you put your used needles and syringes in a special sharps container. Do not put them in a trash can. If you do not have a sharps container, call your pharmacist or healthcare provider to get one. Talk to your pediatrician regarding the use of this medicine in children. While this drug  may be prescribed for selected conditions, precautions do apply. Overdosage: If you think you have taken too much of this medicine contact a poison control center or emergency room at once. NOTE: This medicine is only for you. Do not share this medicine with others. What if I miss a dose? It is important not to miss your dose. Call your doctor or health care professional if you miss your dose. If you miss a dose due to an On-body Injector failure or leakage, a new dose should be administered as soon as possible using a single prefilled syringe for manual use. What may interact with this medicine? Interactions have not been studied. This list may not describe all possible interactions. Give your health care provider a list of all the medicines, herbs, non-prescription drugs, or dietary supplements you use. Also tell them if you smoke, drink alcohol, or use illegal drugs. Some items may interact with your medicine. What should I watch for while using this medicine? Your condition will be monitored carefully while you are receiving this medicine. You may need blood work done while you are taking this medicine. Talk to your health care provider about your risk of cancer. You may be more at risk for certain types of cancer if you take this medicine. If you are going to need a MRI, CT scan, or other procedure, tell your doctor that you are using this medicine (On-Body Injector only). What side effects may I notice from receiving this medicine? Side effects that you should report to your doctor or health care professional as soon as possible:  allergic reactions (skin rash, itching or hives, swelling of   the face, lips, or tongue)  back pain  dizziness  fever  pain, redness, or irritation at site where injected  pinpoint red spots on the skin  red or dark-brown urine  shortness of breath or breathing problems  stomach or side pain, or pain at the shoulder  swelling  tiredness  trouble  passing urine or change in the amount of urine  unusual bruising or bleeding Side effects that usually do not require medical attention (report to your doctor or health care professional if they continue or are bothersome):  bone pain  muscle pain This list may not describe all possible side effects. Call your doctor for medical advice about side effects. You may report side effects to FDA at 1-800-FDA-1088. Where should I keep my medicine? Keep out of the reach of children. If you are using this medicine at home, you will be instructed on how to store it. Throw away any unused medicine after the expiration date on the label. NOTE: This sheet is a summary. It may not cover all possible information. If you have questions about this medicine, talk to your doctor, pharmacist, or health care provider.  2021 Elsevier/Gold Standard (2019-12-12 13:20:51)  

## 2021-05-12 NOTE — Progress Notes (Signed)
Pt was approved for Fulphila from 05/10/21 to 05/09/22 for up to $10,000.  The program reduces pt's copay responsibility to $0.

## 2021-05-17 NOTE — Progress Notes (Signed)
Patient Care Team: Patient, [name] (Inactive) as PCP - General (General Practice) [name], RN as Oncology Nurse Navigator [name], RN as Oncology Nurse Navigator  DIAGNOSIS:    ICD-10-CM   1. Malignant neoplasm of upper-inner quadrant of right breast in female, estrogen receptor positive ([place])  C50.211    Z17.0       SUMMARY OF ONCOLOGIC HISTORY: Oncology History  Malignant neoplasm of upper-inner quadrant of right breast in female, estrogen receptor positive ([place])  04/08/2021 Initial Diagnosis   Palpable right breast mass: Mammogram and US showed a 4.5cm right breast mass at the 12-1 o'clock position with associated microcalcifications, multiple additional masses from the 9-11 o'clock position in the right breast, calcifications in the subareolar right breast, 1.4cm, and two enlarged right axillary lymph nodes. 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 Two variants of uncertain significance (VUS) were detected - one in the APC gene called p.T2422I (c.7265C>T) and a second in the BAP1 gene called p.P302L (c.905C>T). The report dates are 04/19/2021 and 04/25/2021, respectively.  The BRCAplus panel offered by [employer] and includes sequencing and deletion/duplication analysis for the following 8 genes: ATM, BRCA1, BRCA2, CDH1, CHEK2, PALB2, PTEN, and TP53. The CancerNext-Expanded + RNAinsight gene panel offered by [employer] and includes sequencing and  rearrangement analysis for the following 77 genes: AIP, ALK, APC, ATM, AXIN2, BAP1, BARD1, BLM, BMPR1A, BRCA1, BRCA2, BRIP1, CDC73, CDH1, CDK4, CDKN1B, CDKN2A, CHEK2, CTNNA1, DICER1, FANCC, FH, FLCN, GALNT12, KIF1B, LZTR1, MAX, MEN1, MET, MLH1, MSH2, MSH3, MSH6, MUTYH, NBN, NF1, NF2, NTHL1, PALB2, PHOX2B, PMS2, POT1, PRKAR1A, PTCH1, PTEN, RAD51C, RAD51D, RB1, RECQL, RET, SDHA, SDHAF2, SDHB, SDHC, SDHD, SMAD4, SMARCA4, SMARCB1, SMARCE1, STK11, SUFU, TMEM127, TP53, TSC1, TSC2, VHL and XRCC2 (sequencing and deletion/duplication); EGFR, EGLN1, HOXB13, KIT, MITF, PDGFRA, POLD1 and POLE (sequencing only); EPCAM and GREM1 (deletion/duplication only). RNA data is routinely analyzed for use in variant interpretation for all genes.    05/10/2021 -  Chemotherapy    Patient is on Treatment Plan: BREAST ADJUVANT DOSE DENSE AC Q14D / PACLITAXEL Q7D         CHIEF COMPLIANT: Cycle 1, Day 8 Adriamycin and Cytoxan  INTERVAL HISTORY: Chelsea Wise is a 40 y.o. with above-mentioned history of right breast cancer currently on neoadjuvant chemotherapy with dose dense Adriamycin and Cytoxan. She presents to the clinic today for a toxicity check following cycle 1.  She tolerated chemotherapy extremely well.  She did not have any nausea or vomiting.  She had constipation for 1 day which got better by drinking more fluids.  She felt mildly fatigued but she is able to work full-time.  ALLERGIES:  has No Known Allergies.  MEDICATIONS:  Current Outpatient Medications  Medication Sig Dispense Refill   acetaminophen (TYLENOL) 500 MG tablet Take 500-1,000 mg by mouth every 6 (six) hours as needed for moderate pain.     Ascorbic Acid (VITAMIN C PO) Take 1 tablet by mouth daily.  cetirizine (ZYRTEC) 10 MG tablet Take 10 mg by mouth daily as needed for allergies.     dexamethasone (DECADRON) 4 MG tablet Take 1 tablet (4 mg total) by mouth daily. Take 1 tablet day after chemo and 1 tablet 2 days after chemo with food  (Patient taking differently: Take 4 mg by mouth See admin instructions. Take 4 mg day after chemo and 1 tablet 2 days after chemo with food) 8 tablet 0   ELDERBERRY PO Take 1 capsule by mouth daily.     hydrocortisone cream 1 % Apply 1 application topically 2 (two) times daily as needed for itching.     lidocaine-prilocaine (EMLA) cream Apply to affected area once 30 g 3   Multiple Vitamin (MULTIVITAMIN WITH MINERALS) TABS tablet Take 2 tablets by mouth daily.     Multiple Vitamins-Minerals (ZINC PO) Take 1 tablet by mouth once a week.     ondansetron (ZOFRAN) 8 MG tablet Take 1 tablet (8 mg total) by mouth 2 (two) times daily as needed. Start on the third day after chemotherapy. 30 tablet 1   prochlorperazine (COMPAZINE) 10 MG tablet Take 1 tablet (10 mg total) by mouth every 6 (six) hours as needed (Nausea or vomiting). 30 tablet 1   sodium chloride (OCEAN) 0.65 % SOLN nasal spray Place 1 spray into both nostrils as needed for congestion.     traMADol (ULTRAM) 50 MG tablet Take 2 tablets (100 mg total) by mouth every 6 (six) hours as needed. 10 tablet 0   No current facility-administered medications for this visit.    PHYSICAL EXAMINATION: ECOG PERFORMANCE STATUS: 1 - Symptomatic but completely ambulatory  Vitals:   05/18/21 1123  BP: 118/77  Pulse: 88  Resp: 18  Temp: 97.9 F (36.6 C)  SpO2: 100%   Filed Weights   05/18/21 1123  Weight: 129 lb 12.8 oz (58.9 kg)    LABORATORY DATA:  I have reviewed the data as listed CMP Latest Ref Rng & Units 05/10/2021 04/13/2021 03/20/2013  Glucose 70 - 99 mg/dL 83 92 84  BUN 6 - 20 mg/dL $Remove'7 9 7  'XcGBWkk$ Creatinine 0.44 - 1.00 mg/dL 0.69 0.77 0.78  Sodium 135 - 145 mmol/L 139 139 136  Potassium 3.5 - 5.1 mmol/L 3.8 3.6 3.8  Chloride 98 - 111 mmol/L 105 103 102  CO2 22 - 32 mmol/L $RemoveB'23 28 25  'pgmuwNSN$ Calcium 8.9 - 10.3 mg/dL 9.4 9.4 9.1  Total Protein 6.5 - 8.1 g/dL 7.0 7.5 -  Total Bilirubin 0.3 - 1.2 mg/dL <0.2(L) 0.4 -  Alkaline Phos 38 - 126 U/L 40 45 -   AST 15 - 41 U/L 16 20 -  ALT 0 - 44 U/L 10 10 -    Lab Results  Component Value Date   WBC 3.4 (L) 05/18/2021   HGB 11.4 (L) 05/18/2021   HCT 33.3 (L) 05/18/2021   MCV 89.5 05/18/2021   PLT 137 (L) 05/18/2021   NEUTROABS PENDING 05/18/2021    ASSESSMENT & PLAN:  Malignant neoplasm of upper-inner quadrant of right breast in female, estrogen receptor positive ([place]) 04/08/2021:Palpable right breast mass: Mammogram and US showed a 4.5cm right breast mass at the 12-1 o'clock position with associated microcalcifications, multiple additional masses from the 9-11 o'clock position in the right breast, calcifications in the subareolar right breast, 1.4cm, and two enlarged right axillary lymph nodes. 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  Neoadjuvant chemotherapy with dose dense Adriamycin and Cytoxan x4 followed by Taxol weekly x12 2. mastectomy with targeted node dissection 3.  Adjuvant radiation 4.  Followed by adjuvant antiestrogen therapy -------------------------------------------------------------------------------------------------------------------- Current treatment: Cycle 1 day 8 dose dense Adriamycin Cytoxan  Chemo toxicities: Denies any major adverse effects. Blood counts have been reviewed.  She is not terribly neutropenic and therefore we will keep the dosage of next cycle of chemo the same.  Return to clinic in 1 week for cycle 2    No orders of the defined types were placed in this encounter.  The patient has a good understanding of the overall plan. she agrees with it. she will call with any problems that may develop before the next visit here.  Total time spent: 30 mins including face to face time and  time spent for planning, charting and coordination of care  Rulon Eisenmenger, MD, MPH 05/18/2021  I, Thana Ates, am acting as scribe for Dr. Nicholas Lose.  I have reviewed the above documentation for accuracy and completeness, and I agree with the above.

## 2021-05-17 NOTE — Assessment & Plan Note (Signed)
04/08/2021:Palpable right breast mass: Mammogram and US showed a 4.5cm right breast mass at the 12-1 o'clock position with associated microcalcifications, multiple additional masses from the 9-11 o'clock position in the right breast, calcifications in the subareolar right breast, 1.4cm, and two enlarged right axillary lymph nodes. 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  Neoadjuvant chemotherapy with dose dense Adriamycin and Cytoxan x4 followed by Taxol weekly x12 2. mastectomy with targeted node dissection 3.  Adjuvant radiation 4.  Followed by adjuvant antiestrogen therapy -------------------------------------------------------------------------------------------------------------------- Current treatment: Cycle 1 day 8 dose dense Adriamycin Cytoxan  Chemo toxicities:  Return to clinic in 1 week for cycle 2

## 2021-05-18 ENCOUNTER — Inpatient Hospital Stay: Payer: Federal, State, Local not specified - PPO

## 2021-05-18 ENCOUNTER — Encounter: Payer: Self-pay | Admitting: *Deleted

## 2021-05-18 ENCOUNTER — Other Ambulatory Visit: Payer: Self-pay

## 2021-05-18 ENCOUNTER — Inpatient Hospital Stay (HOSPITAL_BASED_OUTPATIENT_CLINIC_OR_DEPARTMENT_OTHER): Payer: Federal, State, Local not specified - PPO | Admitting: Hematology and Oncology

## 2021-05-18 ENCOUNTER — Encounter (HOSPITAL_COMMUNITY): Payer: Self-pay | Admitting: General Surgery

## 2021-05-18 DIAGNOSIS — Z170 Estrogen receptor positive status [ER+]: Secondary | ICD-10-CM | POA: Diagnosis not present

## 2021-05-18 DIAGNOSIS — C50211 Malignant neoplasm of upper-inner quadrant of right female breast: Secondary | ICD-10-CM | POA: Diagnosis not present

## 2021-05-18 DIAGNOSIS — Z95828 Presence of other vascular implants and grafts: Secondary | ICD-10-CM

## 2021-05-18 DIAGNOSIS — Z5111 Encounter for antineoplastic chemotherapy: Secondary | ICD-10-CM | POA: Diagnosis not present

## 2021-05-18 LAB — CMP (CANCER CENTER ONLY)
ALT: 10 U/L (ref 0–44)
AST: 11 U/L — ABNORMAL LOW (ref 15–41)
Albumin: 3.6 g/dL (ref 3.5–5.0)
Alkaline Phosphatase: 59 U/L (ref 38–126)
Anion gap: 9 (ref 5–15)
BUN: 11 mg/dL (ref 6–20)
CO2: 26 mmol/L (ref 22–32)
Calcium: 9.1 mg/dL (ref 8.9–10.3)
Chloride: 103 mmol/L (ref 98–111)
Creatinine: 0.64 mg/dL (ref 0.44–1.00)
GFR, Estimated: >60 mL/min (ref >60)
Glucose, Bld: 99 mg/dL (ref 70–99)
Potassium: 3.9 mmol/L (ref 3.5–5.1)
Sodium: 138 mmol/L (ref 135–145)
Total Bilirubin: <0.2 mg/dL — ABNORMAL LOW (ref 0.3–1.2)
Total Protein: 6.9 g/dL (ref 6.5–8.1)

## 2021-05-18 LAB — CBC WITH DIFFERENTIAL (CANCER CENTER ONLY)
Abs Immature Granulocytes: 0.07 10*3/uL (ref 0.00–0.07)
Basophils Absolute: 0 10*3/uL (ref 0.0–0.1)
Basophils Relative: 1 %
Eosinophils Absolute: 0.1 10*3/uL (ref 0.0–0.5)
Eosinophils Relative: 3 %
HCT: 33.3 % — ABNORMAL LOW (ref 36.0–46.0)
Hemoglobin: 11.4 g/dL — ABNORMAL LOW (ref 12.0–15.0)
Immature Granulocytes: 2 %
Lymphocytes Relative: 40 %
Lymphs Abs: 1.3 10*3/uL (ref 0.7–4.0)
MCH: 30.6 pg (ref 26.0–34.0)
MCHC: 34.2 g/dL (ref 30.0–36.0)
MCV: 89.5 fL (ref 80.0–100.0)
Monocytes Absolute: 0.4 10*3/uL (ref 0.1–1.0)
Monocytes Relative: 12 %
Neutro Abs: 1.4 10*3/uL — ABNORMAL LOW (ref 1.7–7.7)
Neutrophils Relative %: 42 %
Platelet Count: 137 10*3/uL — ABNORMAL LOW (ref 150–400)
RBC: 3.72 MIL/uL — ABNORMAL LOW (ref 3.87–5.11)
RDW: 11.9 % (ref 11.5–15.5)
WBC Count: 3.4 10*3/uL — ABNORMAL LOW (ref 4.0–10.5)
nRBC: 0 % (ref 0.0–0.2)

## 2021-05-18 MED ORDER — NYSTATIN 100000 UNIT/ML MT SUSP: 5.0000 mL | Freq: Two times a day (BID) | OROMUCOSAL | 0 refills | Status: DC

## 2021-05-18 MED ORDER — SODIUM CHLORIDE 0.9% FLUSH
10.0000 mL | INTRAVENOUS | Status: AC | PRN
  Administered 2021-05-18: 10 mL
  Filled 2021-05-18: qty 10

## 2021-05-18 MED ORDER — HEPARIN SOD (PORK) LOCK FLUSH 100 UNIT/ML IV SOLN
500.0000 [IU] | INTRAVENOUS | Status: AC | PRN
  Administered 2021-05-18: 500 [IU]
  Filled 2021-05-18: qty 5

## 2021-05-19 ENCOUNTER — Telehealth: Payer: Self-pay | Admitting: Hematology and Oncology

## 2021-05-19 ENCOUNTER — Telehealth: Payer: Self-pay

## 2021-05-19 ENCOUNTER — Encounter: Payer: Self-pay | Admitting: Hematology and Oncology

## 2021-05-19 NOTE — Telephone Encounter (Signed)
Scheduled per 6/15 los. Pt will receive an updated appt calendar per next visit appt notes

## 2021-05-19 NOTE — Telephone Encounter (Signed)
RN spoke with patient regarding concerns for fatigue and lower back pain.   Patient is s/p D1C1 AC.    Patient denies any recent injuries, or new activities.  Patient has full function of bowel and bladder.  RN provided education on side effects of chemotherapy and G-CSF.  RN encouraged use to Tylenol for back pain, elevating legs, heat pack, and rest.  Denies any nausea/vomiting or fevers.  Pt verbalized understanding and agreement.  RN encouraged patient to notify clinic of any changes or no relief with interventions.

## 2021-05-19 NOTE — Telephone Encounter (Signed)
Nutrition Assessment   Reason for Assessment: Pt request  40 year old female with right breast cancer, estrogen receptor positive. She is receiving neoadjuvant chemotherapy with Adriamycin and Cytoxan followed by Taxol. Plans for mastectomy followed by adjuvant radiation and adjuvant antiestrogen therapy. Patient is followed by Dr. Lindi Adie.  Received in-basket from nursing with request to contact patient regarding diet questions. Spoke with patient and husband via telephone. Patient reports she is tolerating treatment well, denies nutrition impact symptoms. She reports slight soreness with swallowing secondary to thrush that is better after Nystatin. Husband is very supportive and "wants to make sure his wife has everything she needs" is asking questions about foods patient can and cannot have as well as inquiring about daily menu plan for patient to follow during treatment. They have been eating a variety of foods (fish, chicken, greens, beans, salads, eggs, protein drinks) He reports tonight they are having grilled chicken with salad. Husband asking how much "pasta is too much", says they were told not to eat any potatoes, have very limited pasta and bread, and if having rice to only have brown rice. Reports being told not to drink any carbonated water and only eat sugar free jello/pudding.    Medications: Nystatin, Tramadol, MVI, Decadron, Zofran, Compazine   Labs: reviewed   Anthropometrics: Patient weights have decreased 2.3% in 2 weeks which is concerning.   Height: 5'3" Weight: 129 lb 12.8 oz (6/15) UBW: 137 lb (01/22) BMI: 22.99   NUTRITION DIAGNOSIS: Food and nutrition related knowledge deficit related to breast cancer and associated treatments as evidenced by no prior need for related nutrition information    INTERVENTION:  Provided education on the importance of adequate calories and protein to maintain weights/strength and nutrition during treatment Encouraged to eat regular  diet as tolerated Discussed foods to limit/avoid with oral thrush and importance of mouth care, will mail handout Encouraged small frequent meals and snacks - will mail high calorie, high protein snack ideas Discussed types of supplements, recommended 1-2 Ensure Enlive/equivalent daily (350 kcal, 20 grams protein) Will mail Boost and Ensure coupons Discussed foods with protein to include with meals/snacks Educated on balanced meals that include carbohydrates, protein, and fat Discussed research and AICR recommendations for plant-based diet for lowering risk of recurrence Provided contact information, encouraged patient and husband to call with nutrition concerns/questions   MONITORING, EVALUATION, GOAL: Patient will tolerate increased calories and protein to minimize weight loss   Next Visit: No follow-up scheduled at this time

## 2021-05-23 NOTE — Assessment & Plan Note (Signed)
04/08/2021:Palpable right breast mass: Mammogram and US showed a 4.5cm right breast mass at the 12-1 o'clock position with associated microcalcifications, multiple additional masses from the 9-11 o'clock position in the right breast, calcifications in the subareolar right breast, 1.4cm, and two enlarged right axillary lymph nodes. 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 Neoadjuvant chemotherapy with dose dense Adriamycin and Cytoxan x4 followed by Taxol weekly x12 2. mastectomy with targeted node dissection 3. Adjuvant radiation 4. Followed by adjuvant antiestrogen therapy -------------------------------------------------------------------------------------------------------------------- Current treatment: Cycle 2 dose dense Adriamycin Cytoxan  Chemo toxicities: Denies any major adverse effects. Blood counts have been reviewed.   Return to clinic in 2 weeks for cycle 3

## 2021-05-23 NOTE — Progress Notes (Signed)
Patient Care Team: Patient, [name] (Inactive) as PCP - General (General Practice) Pershing Proud, RN as Oncology Nurse Navigator Donnelly Angelica, RN as Oncology Nurse Navigator  DIAGNOSIS:    ICD-10-CM   1. Malignant neoplasm of upper-inner quadrant of right breast in female, estrogen receptor positive (HCC)  C50.211    Z17.0       SUMMARY OF ONCOLOGIC HISTORY: Oncology History  Malignant neoplasm of upper-inner quadrant of right breast in female, estrogen receptor positive (HCC)  04/08/2021 Initial Diagnosis   Palpable right breast mass: Mammogram and US showed a 4.5cm right breast mass at the 12-1 o'clock position with associated microcalcifications, multiple additional masses from the 9-11 o'clock position in the right breast, calcifications in the subareolar right breast, 1.4cm, and two enlarged right axillary lymph nodes. 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 Two variants of uncertain significance (VUS) were detected - one in the APC gene called p.T2422I (c.7265C>T) and a second in the BAP1 gene called p.P302L (c.905C>T). The report dates are 04/19/2021 and 04/25/2021, respectively.  The BRCAplus panel offered by [employer] and includes sequencing and deletion/duplication analysis for the following 8 genes: ATM, BRCA1, BRCA2, CDH1, CHEK2, PALB2, PTEN, and TP53. The CancerNext-Expanded + RNAinsight gene panel offered by [employer] and includes sequencing and  rearrangement analysis for the following 77 genes: AIP, ALK, APC, ATM, AXIN2, BAP1, BARD1, BLM, BMPR1A, BRCA1, BRCA2, BRIP1, CDC73, CDH1, CDK4, CDKN1B, CDKN2A, CHEK2, CTNNA1, DICER1, FANCC, FH, FLCN, GALNT12, KIF1B, LZTR1, MAX, MEN1, MET, MLH1, MSH2, MSH3, MSH6, MUTYH, NBN, NF1, NF2, NTHL1, PALB2, PHOX2B, PMS2, POT1, PRKAR1A, PTCH1, PTEN, RAD51C, RAD51D, RB1, RECQL, RET, SDHA, SDHAF2, SDHB, SDHC, SDHD, SMAD4, SMARCA4, SMARCB1, SMARCE1, STK11, SUFU, TMEM127, TP53, TSC1, TSC2, VHL and XRCC2 (sequencing and deletion/duplication); EGFR, EGLN1, HOXB13, KIT, MITF, PDGFRA, POLD1 and POLE (sequencing only); EPCAM and GREM1 (deletion/duplication only). RNA data is routinely analyzed for use in variant interpretation for all genes.    05/10/2021 -  Chemotherapy    Patient is on Treatment Plan: BREAST ADJUVANT DOSE DENSE AC Q14D / PACLITAXEL Q7D         CHIEF COMPLIANT: Cycle 2, Day 1 Adriamycin and Cytoxan  INTERVAL HISTORY: Chelsea Wise is a 40 y.o. with above-mentioned history of right breast cancer currently on neoadjuvant chemotherapy with dose dense Adriamycin and Cytoxan. She presents to the clinic today for a toxicity check following cycle 2. after the last visit she had some back spasms and leg spasms.  They got better with a heating pad.  She is here for cycle 2 of treatment.  Her taste and appetite have improved.  ALLERGIES:  has No Known Allergies.  MEDICATIONS:  Current Outpatient Medications  Medication Sig Dispense Refill   acetaminophen (TYLENOL) 500 MG tablet Take 500-1,000 mg by mouth every 6 (six) hours as needed for moderate pain.     Ascorbic Acid (VITAMIN C PO) Take 1 tablet by mouth daily.     cetirizine (ZYRTEC) 10  MG tablet Take 10 mg by mouth daily as needed for allergies.     dexamethasone (DECADRON) 4 MG tablet Take 1 tablet (4 mg total) by mouth daily. Take 1 tablet day after chemo and 1 tablet 2 days after chemo with food (Patient taking differently: Take 4 mg by mouth  See admin instructions. Take 4 mg day after chemo and 1 tablet 2 days after chemo with food) 8 tablet 0   ELDERBERRY PO Take 1 capsule by mouth daily.     hydrocortisone cream 1 % Apply 1 application topically 2 (two) times daily as needed for itching.     lidocaine-prilocaine (EMLA) cream Apply to affected area once 30 g 3   Multiple Vitamin (MULTIVITAMIN WITH MINERALS) TABS tablet Take 2 tablets by mouth daily.     Multiple Vitamins-Minerals (ZINC PO) Take 1 tablet by mouth once a week.     nystatin (MYCOSTATIN) 100000 UNIT/ML suspension Take 5 mLs (500,000 Units total) by mouth in the morning and at bedtime. 60 mL 0   ondansetron (ZOFRAN) 8 MG tablet Take 1 tablet (8 mg total) by mouth 2 (two) times daily as needed. Start on the third day after chemotherapy. 30 tablet 1   prochlorperazine (COMPAZINE) 10 MG tablet Take 1 tablet (10 mg total) by mouth every 6 (six) hours as needed (Nausea or vomiting). 30 tablet 1   sodium chloride (OCEAN) 0.65 % SOLN nasal spray Place 1 spray into both nostrils as needed for congestion.     traMADol (ULTRAM) 50 MG tablet Take 2 tablets (100 mg total) by mouth every 6 (six) hours as needed. 10 tablet 0   No current facility-administered medications for this visit.    PHYSICAL EXAMINATION: ECOG PERFORMANCE STATUS: 1 - Symptomatic but completely ambulatory  Vitals:   05/24/21 1201  BP: 114/73  Pulse: 72  Resp: 18  Temp: 97.7 F (36.5 C)  SpO2: 100%   Filed Weights   05/24/21 1201  Weight: 131 lb 12.8 oz (59.8 kg)    LABORATORY DATA:  I have reviewed the data as listed CMP Latest Ref Rng & Units 05/18/2021 05/10/2021 04/13/2021  Glucose 70 - 99 mg/dL 99 83 92  BUN 6 - 20 mg/dL $Remove'11 7 9  'TyzXlHR$ Creatinine 0.44 - 1.00 mg/dL 0.64 0.69 0.77  Sodium 135 - 145 mmol/L 138 139 139  Potassium 3.5 - 5.1 mmol/L 3.9 3.8 3.6  Chloride 98 - 111 mmol/L 103 105 103  CO2 22 - 32 mmol/L $RemoveB'26 23 28  'hYnURnap$ Calcium 8.9 - 10.3 mg/dL 9.1 9.4 9.4  Total Protein 6.5 - 8.1 g/dL 6.9 7.0  7.5  Total Bilirubin 0.3 - 1.2 mg/dL <0.2(L) <0.2(L) 0.4  Alkaline Phos 38 - 126 U/L 59 40 45  AST 15 - 41 U/L 11(L) 16 20  ALT 0 - 44 U/L $Remo'10 10 10    'FRSQf$ Lab Results  Component Value Date   WBC 12.3 (H) 05/24/2021   HGB 11.8 (L) 05/24/2021   HCT 33.5 (L) 05/24/2021   MCV 88.4 05/24/2021   PLT 229 05/24/2021   NEUTROABS 8.4 (H) 05/24/2021    ASSESSMENT & PLAN:  Malignant neoplasm of upper-inner quadrant of right breast in female, estrogen receptor positive ([place]) 04/08/2021:Palpable right breast mass: Mammogram and US showed a 4.5cm right breast mass at the 12-1 o'clock position with associated microcalcifications, multiple additional masses from the 9-11 o'clock position in the right breast, calcifications in the subareolar right breast, 1.4cm, and two enlarged right axillary lymph nodes. Biopsy showed  invasive and in situ ductal carcinoma in the breast and axilla, grade 2 ER 80%, PR 1%, Ki-67 25%, HER2 equivalent by IHC, FISH negative   Breast MRI 04/15/2021: 5.7 cm lobulated mass right breast with enlarged right axillary lymph nodes (2 lymph nodes) MammaPrint: High risk   Treatment plan: 1.  Neoadjuvant chemotherapy with dose dense Adriamycin and Cytoxan x4 followed by Taxol weekly x12 2. mastectomy with targeted node dissection 3.  Adjuvant radiation 4.  Followed by adjuvant antiestrogen therapy -------------------------------------------------------------------------------------------------------------------- Current treatment: Cycle 2 dose dense Adriamycin Cytoxan   Chemo toxicities: Mild fatigue, occasional back spasms Denies any nausea or vomiting. Blood counts have been reviewed.    Return to clinic in 2 weeks for cycle 3    No orders of the defined types were placed in this encounter.  The patient has a good understanding of the overall plan. she agrees with it. she will call with any problems that may develop before the next visit here.  Total time spent: 30 mins  including face to face time and time spent for planning, charting and coordination of care  Rulon Eisenmenger, MD, MPH 05/24/2021  I, Thana Ates, am acting as scribe for Dr. Nicholas Lose.  I have reviewed the above documentation for accuracy and completeness, and I agree with the above.

## 2021-05-24 ENCOUNTER — Inpatient Hospital Stay (HOSPITAL_BASED_OUTPATIENT_CLINIC_OR_DEPARTMENT_OTHER): Payer: Federal, State, Local not specified - PPO | Admitting: Hematology and Oncology

## 2021-05-24 ENCOUNTER — Inpatient Hospital Stay: Payer: Federal, State, Local not specified - PPO

## 2021-05-24 ENCOUNTER — Other Ambulatory Visit: Payer: Self-pay

## 2021-05-24 DIAGNOSIS — Z170 Estrogen receptor positive status [ER+]: Secondary | ICD-10-CM | POA: Diagnosis not present

## 2021-05-24 DIAGNOSIS — C50211 Malignant neoplasm of upper-inner quadrant of right female breast: Secondary | ICD-10-CM | POA: Diagnosis not present

## 2021-05-24 DIAGNOSIS — Z5111 Encounter for antineoplastic chemotherapy: Secondary | ICD-10-CM | POA: Diagnosis not present

## 2021-05-24 LAB — CBC WITH DIFFERENTIAL/PLATELET
Abs Immature Granulocytes: 0.87 10*3/uL — ABNORMAL HIGH (ref 0.00–0.07)
Basophils Absolute: 0.1 10*3/uL (ref 0.0–0.1)
Basophils Relative: 1 %
Eosinophils Absolute: 0 10*3/uL (ref 0.0–0.5)
Eosinophils Relative: 0 %
HCT: 33.5 % — ABNORMAL LOW (ref 36.0–46.0)
Hemoglobin: 11.8 g/dL — ABNORMAL LOW (ref 12.0–15.0)
Immature Granulocytes: 7 %
Lymphocytes Relative: 15 %
Lymphs Abs: 1.9 10*3/uL (ref 0.7–4.0)
MCH: 31.1 pg (ref 26.0–34.0)
MCHC: 35.2 g/dL (ref 30.0–36.0)
MCV: 88.4 fL (ref 80.0–100.0)
Monocytes Absolute: 1.1 10*3/uL — ABNORMAL HIGH (ref 0.1–1.0)
Monocytes Relative: 9 %
Neutro Abs: 8.4 10*3/uL — ABNORMAL HIGH (ref 1.7–7.7)
Neutrophils Relative %: 68 %
Platelets: 229 10*3/uL (ref 150–400)
RBC: 3.79 MIL/uL — ABNORMAL LOW (ref 3.87–5.11)
RDW: 12.3 % (ref 11.5–15.5)
WBC: 12.3 10*3/uL — ABNORMAL HIGH (ref 4.0–10.5)
nRBC: 0.2 % (ref 0.0–0.2)

## 2021-05-24 LAB — CMP (CANCER CENTER ONLY)
ALT: 18 U/L (ref 0–44)
AST: 20 U/L (ref 15–41)
Albumin: 4 g/dL (ref 3.5–5.0)
Alkaline Phosphatase: 69 U/L (ref 38–126)
Anion gap: 6 (ref 5–15)
BUN: 13 mg/dL (ref 6–20)
CO2: 28 mmol/L (ref 22–32)
Calcium: 9.1 mg/dL (ref 8.9–10.3)
Chloride: 104 mmol/L (ref 98–111)
Creatinine: 0.64 mg/dL (ref 0.44–1.00)
GFR, Estimated: >60 mL/min (ref >60)
Glucose, Bld: 90 mg/dL (ref 70–99)
Potassium: 3.7 mmol/L (ref 3.5–5.1)
Sodium: 138 mmol/L (ref 135–145)
Total Bilirubin: 0.2 mg/dL — ABNORMAL LOW (ref 0.3–1.2)
Total Protein: 7.2 g/dL (ref 6.5–8.1)

## 2021-05-24 MED ORDER — SODIUM CHLORIDE 0.9 % IV SOLN
150.0000 mg | Freq: Once | INTRAVENOUS | Status: AC
  Administered 2021-05-24: 150 mg via INTRAVENOUS
  Filled 2021-05-24: qty 150

## 2021-05-24 MED ORDER — SODIUM CHLORIDE 0.9% FLUSH
10.0000 mL | INTRAVENOUS | Status: DC | PRN
  Administered 2021-05-24: 10 mL
  Filled 2021-05-24: qty 10

## 2021-05-24 MED ORDER — DOXORUBICIN HCL CHEMO IV INJECTION 2 MG/ML
60.0000 mg/m2 | Freq: Once | INTRAVENOUS | Status: AC
  Administered 2021-05-24: 98 mg via INTRAVENOUS
  Filled 2021-05-24: qty 49

## 2021-05-24 MED ORDER — PALONOSETRON HCL INJECTION 0.25 MG/5ML
0.2500 mg | Freq: Once | INTRAVENOUS | Status: AC
Start: 2021-05-24 — End: 2021-05-24
  Administered 2021-05-24: 0.25 mg via INTRAVENOUS

## 2021-05-24 MED ORDER — HEPARIN SOD (PORK) LOCK FLUSH 100 UNIT/ML IV SOLN
500.0000 [IU] | Freq: Once | INTRAVENOUS | Status: AC | PRN
  Administered 2021-05-24: 500 [IU]
  Filled 2021-05-24: qty 5

## 2021-05-24 MED ORDER — PALONOSETRON HCL INJECTION 0.25 MG/5ML
INTRAVENOUS | Status: AC
  Filled 2021-05-24: qty 5

## 2021-05-24 MED ORDER — SODIUM CHLORIDE 0.9 % IV SOLN
Freq: Once | INTRAVENOUS | Status: AC
  Filled 2021-05-24: qty 250

## 2021-05-24 MED ORDER — SODIUM CHLORIDE 0.9 % IV SOLN
10.0000 mg | Freq: Once | INTRAVENOUS | Status: AC
  Administered 2021-05-24: 10 mg via INTRAVENOUS
  Filled 2021-05-24: qty 10

## 2021-05-24 MED ORDER — SODIUM CHLORIDE 0.9 % IV SOLN
600.0000 mg/m2 | Freq: Once | INTRAVENOUS | Status: AC
  Administered 2021-05-24: 980 mg via INTRAVENOUS
  Filled 2021-05-24: qty 49

## 2021-05-24 NOTE — Patient Instructions (Signed)
Implanted Port Home Guide An implanted port is a device that is placed under the skin. It is usually placed in the chest. The device can be used to give IV medicine, to take blood, or for dialysis. You may have an implanted port if: You need IV medicine that would be irritating to the small veins in your hands or arms. You need IV medicines, such as antibiotics, for a long period of time. You need IV nutrition for a long period of time. You need dialysis. When you have a port, your health care provider can choose to use the port instead of veins in your arms for these procedures. You may have fewer limitations when using a port than you would if you used other types of long-term IVs, and you will likely be able to return to normal activities afteryour incision heals. An implanted port has two main parts: Reservoir. The reservoir is the part where a needle is inserted to give medicines or draw blood. The reservoir is round. After it is placed, it appears as a small, raised area under your skin. Catheter. The catheter is a thin, flexible tube that connects the reservoir to a vein. Medicine that is inserted into the reservoir goes into the catheter and then into the vein. How is my port accessed? To access your port: A numbing cream may be placed on the skin over the port site. Your health care provider will put on a mask and sterile gloves. The skin over your port will be cleaned carefully with a germ-killing soap and allowed to dry. Your health care provider will gently pinch the port and insert a needle into it. Your health care provider will check for a blood return to make sure the port is in the vein and is not clogged. If your port needs to remain accessed to get medicine continuously (constant infusion), your health care provider will place a clear bandage (dressing) over the needle site. The dressing and needle will need to be changed every week, or as told by your health care provider. What  is flushing? Flushing helps keep the port from getting clogged. Follow instructions from your health care provider about how and when to flush the port. Ports are usually flushed with saline solution or a medicine called heparin. The need for flushing will depend on how the port is used: If the port is only used from time to time to give medicines or draw blood, the port may need to be flushed: Before and after medicines have been given. Before and after blood has been drawn. As part of routine maintenance. Flushing may be recommended every 4-6 weeks. If a constant infusion is running, the port may not need to be flushed. Throw away any syringes in a disposal container that is meant for sharp items (sharps container). You can buy a sharps container from a pharmacy, or you can make one by using an empty hard plastic bottle with a cover. How long will my port stay implanted? The port can stay in for as long as your health care provider thinks it is needed. When it is time for the port to come out, a surgery will be done to remove it. The surgery will be similar to the procedure that was done to putthe port in. Follow these instructions at home:  Flush your port as told by your health care provider. If you need an infusion over several days, follow instructions from your health care provider about how to take   care of your port site. Make sure you: Wash your hands with soap and water before you change your dressing. If soap and water are not available, use alcohol-based hand sanitizer. Change your dressing as told by your health care provider. Place any used dressings or infusion bags into a plastic bag. Throw that bag in the trash. Keep the dressing that covers the needle clean and dry. Do not get it wet. Do not use scissors or sharp objects near the tube. Keep the tube clamped, unless it is being used. Check your port site every day for signs of infection. Check for: Redness, swelling, or  pain. Fluid or blood. Pus or a bad smell. Protect the skin around the port site. Avoid wearing bra straps that rub or irritate the site. Protect the skin around your port from seat belts. Place a soft pad over your chest if needed. Bathe or shower as told by your health care provider. The site may get wet as long as you are not actively receiving an infusion. Return to your normal activities as told by your health care provider. Ask your health care provider what activities are safe for you. Carry a medical alert card or wear a medical alert bracelet at all times. This will let health care providers know that you have an implanted port in case of an emergency. Get help right away if: You have redness, swelling, or pain at the port site. You have fluid or blood coming from your port site. You have pus or a bad smell coming from the port site. You have a fever. Summary Implanted ports are usually placed in the chest for long-term IV access. Follow instructions from your health care provider about flushing the port and changing bandages (dressings). Take care of the area around your port by avoiding clothing that puts pressure on the area, and by watching for signs of infection. Protect the skin around your port from seat belts. Place a soft pad over your chest if needed. Get help right away if you have a fever or you have redness, swelling, pain, drainage, or a bad smell at the port site. This information is not intended to replace advice given to you by your health care provider. Make sure you discuss any questions you have with your healthcare provider. Document Revised: 04/05/2020 Document Reviewed: 04/05/2020 Elsevier Patient Education  2022 [employer]  

## 2021-05-24 NOTE — Patient Instructions (Signed)
[facility]  Discharge Instructions: Thank you for choosing [facility] to provide your oncology and hematology care.   If you have a lab appointment with the [facility], please go directly to the [facility] and check in at the registration area.   Wear comfortable clothing and clothing appropriate for easy access to any Portacath or PICC line.   We strive to give you quality time with your provider. You may need to reschedule your appointment if you arrive late (15 or more minutes).  Arriving late affects you and other patients whose appointments are after yours.  Also, if you miss three or more appointments without notifying the office, you may be dismissed from the clinic at the provider's discretion.      For prescription refill requests, have your pharmacy contact our office and allow 72 hours for refills to be completed.    Today you received the following chemotherapy and/or immunotherapy agents: Adriamycin; Cytoxin    To help prevent nausea and vomiting after your treatment, we encourage you to take your nausea medication as directed.  BELOW ARE SYMPTOMS THAT SHOULD BE REPORTED IMMEDIATELY: *FEVER GREATER THAN 100.4 F (38 C) OR HIGHER *CHILLS OR SWEATING *NAUSEA AND VOMITING THAT IS NOT CONTROLLED WITH YOUR NAUSEA MEDICATION *UNUSUAL SHORTNESS OF BREATH *UNUSUAL BRUISING OR BLEEDING *URINARY PROBLEMS (pain or burning when urinating, or frequent urination) *BOWEL PROBLEMS (unusual diarrhea, constipation, pain near the anus) TENDERNESS IN MOUTH AND THROAT WITH OR WITHOUT PRESENCE OF ULCERS (sore throat, sores in mouth, or a toothache) UNUSUAL RASH, SWELLING OR PAIN  UNUSUAL VAGINAL DISCHARGE OR ITCHING   Items with * indicate a potential emergency and should be followed up as soon as possible or go to the Emergency Department if any problems should occur.  Please show the CHEMOTHERAPY ALERT CARD or IMMUNOTHERAPY ALERT CARD at  check-in to the Emergency Department and triage nurse.  Should you have questions after your visit or need to cancel or reschedule your appointment, please contact [facility]  Dept: [phone]  and follow the prompts.  Office hours are 8:00 a.m. to 4:30 p.m. Monday - Friday. Please note that voicemails left after 4:00 p.m. may not be returned until the following business day.  We are closed weekends and major holidays. You have access to a nurse at all times for urgent questions. Please call the main number to the clinic Dept: [phone] and follow the prompts.   For any non-urgent questions, you may also contact your provider using MyChart. We now offer e-Visits for anyone 42 and older to request care online for non-urgent symptoms. For details visit [email or web address].   Also download the MyChart app! Go to the app store, search "MyChart", open the app, select [facility], and log in with your MyChart username and password.  Due to Covid, a mask is required upon entering the hospital/clinic. If you do not have a mask, one will be given to you upon arrival. For doctor visits, patients may have 1 support person aged 43 or older with them. For treatment visits, patients cannot have anyone with them due to current Covid guidelines and our immunocompromised population.

## 2021-05-26 ENCOUNTER — Other Ambulatory Visit: Payer: Self-pay

## 2021-05-26 ENCOUNTER — Inpatient Hospital Stay: Payer: Federal, State, Local not specified - PPO

## 2021-05-26 VITALS — BP 114/80 | HR 73 | Resp 16

## 2021-05-26 DIAGNOSIS — Z5111 Encounter for antineoplastic chemotherapy: Secondary | ICD-10-CM | POA: Diagnosis not present

## 2021-05-26 DIAGNOSIS — Z170 Estrogen receptor positive status [ER+]: Secondary | ICD-10-CM

## 2021-05-26 DIAGNOSIS — C50211 Malignant neoplasm of upper-inner quadrant of right female breast: Secondary | ICD-10-CM

## 2021-05-26 MED ORDER — PEGFILGRASTIM-JMDB 6 MG/0.6ML ~~LOC~~ SOSY
PREFILLED_SYRINGE | SUBCUTANEOUS | Status: AC
  Filled 2021-05-26: qty 0.6

## 2021-05-26 MED ORDER — PEGFILGRASTIM-JMDB 6 MG/0.6ML ~~LOC~~ SOSY
6.0000 mg | PREFILLED_SYRINGE | Freq: Once | SUBCUTANEOUS | Status: AC
  Administered 2021-05-26: 6 mg via SUBCUTANEOUS

## 2021-05-26 NOTE — Patient Instructions (Signed)

## 2021-06-06 NOTE — Progress Notes (Signed)
Patient Care Team: Patient, [name] (Inactive) as PCP - General (General Practice) Chelsea Kaufmann, RN as Oncology Nurse Navigator [name], RN as Oncology Nurse Navigator  DIAGNOSIS:    ICD-10-CM   1. Malignant neoplasm of upper-inner quadrant of right breast in female, estrogen receptor positive ([place])  C50.211    Z17.0       SUMMARY OF ONCOLOGIC HISTORY: Oncology History  Malignant neoplasm of upper-inner quadrant of right breast in female, estrogen receptor positive ([place])  04/08/2021 Initial Diagnosis   Palpable right breast mass: Mammogram and US showed a 4.5cm right breast mass at the 12-1 o'clock position with associated microcalcifications, multiple additional masses from the 9-11 o'clock position in the right breast, calcifications in the subareolar right breast, 1.4cm, and two enlarged right axillary lymph nodes. 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 Two variants of uncertain significance (VUS) were detected - one in the APC gene called p.T2422I (c.7265C>T) and a second in the BAP1 gene called p.P302L (c.905C>T). The report dates are 04/19/2021 and 04/25/2021, respectively.  The BRCAplus panel offered by [employer] and includes sequencing and deletion/duplication analysis for the following 8 genes: ATM, BRCA1, BRCA2, CDH1, CHEK2, PALB2, PTEN, and TP53. The CancerNext-Expanded + RNAinsight gene panel offered by [employer] and includes sequencing and  rearrangement analysis for the following 77 genes: AIP, ALK, APC, ATM, AXIN2, BAP1, BARD1, BLM, BMPR1A, BRCA1, BRCA2, BRIP1, CDC73, CDH1, CDK4, CDKN1B, CDKN2A, CHEK2, CTNNA1, DICER1, FANCC, FH, FLCN, GALNT12, KIF1B, LZTR1, MAX, MEN1, MET, MLH1, MSH2, MSH3, MSH6, MUTYH, NBN, NF1, NF2, NTHL1, PALB2, PHOX2B, PMS2, POT1, PRKAR1A, PTCH1, PTEN, RAD51C, RAD51D, RB1, RECQL, RET, SDHA, SDHAF2, SDHB, SDHC, SDHD, SMAD4, SMARCA4, SMARCB1, SMARCE1, STK11, SUFU, TMEM127, TP53, TSC1, TSC2, VHL and XRCC2 (sequencing and deletion/duplication); EGFR, EGLN1, HOXB13, KIT, MITF, PDGFRA, POLD1 and POLE (sequencing only); EPCAM and GREM1 (deletion/duplication only). RNA data is routinely analyzed for use in variant interpretation for all genes.    05/10/2021 -  Chemotherapy    Patient is on Treatment Plan: BREAST ADJUVANT DOSE DENSE AC Q14D / PACLITAXEL Q7D         CHIEF COMPLIANT: Cycle 3, Day 1 Adriamycin and Cytoxan  INTERVAL HISTORY: Chelsea Wise is a 40 y.o. with above-mentioned history of right breast cancer currently on neoadjuvant chemotherapy with dose dense Adriamycin and Cytoxan. She reports to the clinic today for Cycle 3.   ALLERGIES:  has No Known Allergies.  MEDICATIONS:  Current Outpatient Medications  Medication Sig Dispense Refill   acetaminophen (TYLENOL) 500 MG tablet Take 500-1,000 mg by mouth every 6 (six) hours as needed for moderate pain.     Ascorbic Acid (VITAMIN C PO) Take 1 tablet by mouth daily.     cetirizine (ZYRTEC) 10 MG tablet Take 10 mg by mouth daily as needed for allergies.     dexamethasone (DECADRON) 4 MG tablet Take 1 tablet (4 mg total) by mouth daily. Take 1 tablet day after chemo and 1 tablet  2 days after chemo with food (Patient taking differently: Take 4 mg by mouth See admin instructions. Take 4 mg day after chemo and 1 tablet 2 days after chemo with food) 8 tablet 0   ELDERBERRY PO Take 1 capsule by mouth daily.     hydrocortisone cream 1 % Apply 1 application  topically 2 (two) times daily as needed for itching.     lidocaine-prilocaine (EMLA) cream Apply to affected area once 30 g 3   Multiple Vitamin (MULTIVITAMIN WITH MINERALS) TABS tablet Take 2 tablets by mouth daily.     Multiple Vitamins-Minerals (ZINC PO) Take 1 tablet by mouth once a week.     nystatin (MYCOSTATIN) 100000 UNIT/ML suspension Take 5 mLs (500,000 Units total) by mouth in the morning and at bedtime. 60 mL 0   ondansetron (ZOFRAN) 8 MG tablet Take 1 tablet (8 mg total) by mouth 2 (two) times daily as needed. Start on the third day after chemotherapy. 30 tablet 1   prochlorperazine (COMPAZINE) 10 MG tablet Take 1 tablet (10 mg total) by mouth every 6 (six) hours as needed (Nausea or vomiting). 30 tablet 1   sodium chloride (OCEAN) 0.65 % SOLN nasal spray Place 1 spray into both nostrils as needed for congestion.     traMADol (ULTRAM) 50 MG tablet Take 2 tablets (100 mg total) by mouth every 6 (six) hours as needed. 10 tablet 0   No current facility-administered medications for this visit.    PHYSICAL EXAMINATION: ECOG PERFORMANCE STATUS: 1 - Symptomatic but completely ambulatory  Vitals:   06/07/21 0945  BP: 126/90  Pulse: 69  Resp: 17  Temp: 97.6 F (36.4 C)  SpO2: 100%   Filed Weights   06/07/21 0945  Weight: 130 lb 12.8 oz (59.3 kg)    LABORATORY DATA:  I have reviewed the data as listed CMP Latest Ref Rng & Units 05/24/2021 05/18/2021 05/10/2021  Glucose 70 - 99 mg/dL 90 99 83  BUN 6 - 20 mg/dL _0 Creatinine 0.44 - 1.00 mg/dL 0.64 0.64 0.69  Sodium 135 - 145 mmol/L 138 138 139  Potassium 3.5 - 5.1 mmol/L 3.7 3.9 3.8  Chloride 98 - 111 mmol/L 104 103 105  CO2 22 - 32 mmol/L _1 Calcium 8.9 - 10.3 mg/dL 9.1 9.1 9.4  Total Protein 6.5 - 8.1 g/dL 7.2 6.9 7.0  Total Bilirubin 0.3 - 1.2 mg/dL 0.2(L) <0.2(L) <0.2(L)  Alkaline Phos 38 - 126 U/L 69 59 40  AST 15 - 41 U/L 20 11(L) 16  ALT 0 - 44 U/L _2 Lab Results  Component Value Date   WBC  11.2 (H) 06/07/2021   HGB 11.5 (L) 06/07/2021   HCT 33.1 (L) 06/07/2021   MCV 89.0 06/07/2021   PLT 171 06/07/2021   NEUTROABS 7.6 06/07/2021    ASSESSMENT & PLAN:  Malignant neoplasm of upper-inner quadrant of right breast in female, estrogen receptor positive ([place]) 04/08/2021:Palpable right breast mass: Mammogram and US showed a 4.5cm right breast mass at the 12-1 o'clock position with associated microcalcifications, multiple additional masses from the 9-11 o'clock position in the right breast, calcifications in the subareolar right breast, 1.4cm, and two enlarged right axillary lymph nodes. 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  Neoadjuvant chemotherapy with dose dense Adriamycin and Cytoxan x4 followed by Taxol weekly x12 2. mastectomy with targeted node dissection 3.  Adjuvant radiation 4.  Followed by adjuvant antiestrogen therapy -------------------------------------------------------------------------------------------------------------------- Current treatment: Cycle 3 dose dense Adriamycin Cytoxan   Chemo toxicities:  Mild fatigue Tongue discoloration  Denies any nausea or vomiting. Blood counts have been reviewed.   Return to clinic in 2 weeks for cycle 4    No orders of the defined types were placed in this encounter.  The patient has a good understanding of the overall plan. she agrees with it. she will call with any problems that may develop before the next visit here.  Total time spent: 30 mins including face to face time and time spent for planning, charting and coordination of care  Rulon Eisenmenger, MD, MPH 06/07/2021  I, Thana Ates, am acting as scribe for Dr. Nicholas Lose.  I have reviewed  the above documentation for accuracy and completeness, and I agree with the above.

## 2021-06-07 ENCOUNTER — Inpatient Hospital Stay: Payer: Federal, State, Local not specified - PPO | Attending: Hematology and Oncology

## 2021-06-07 ENCOUNTER — Inpatient Hospital Stay: Payer: Federal, State, Local not specified - PPO

## 2021-06-07 ENCOUNTER — Inpatient Hospital Stay: Payer: Federal, State, Local not specified - PPO | Admitting: Dietician

## 2021-06-07 ENCOUNTER — Inpatient Hospital Stay (HOSPITAL_BASED_OUTPATIENT_CLINIC_OR_DEPARTMENT_OTHER): Payer: Federal, State, Local not specified - PPO | Admitting: Hematology and Oncology

## 2021-06-07 ENCOUNTER — Other Ambulatory Visit: Payer: Self-pay | Admitting: *Deleted

## 2021-06-07 ENCOUNTER — Other Ambulatory Visit: Payer: Self-pay

## 2021-06-07 DIAGNOSIS — Z170 Estrogen receptor positive status [ER+]: Secondary | ICD-10-CM

## 2021-06-07 DIAGNOSIS — C50211 Malignant neoplasm of upper-inner quadrant of right female breast: Secondary | ICD-10-CM

## 2021-06-07 DIAGNOSIS — Z5189 Encounter for other specified aftercare: Secondary | ICD-10-CM | POA: Diagnosis not present

## 2021-06-07 DIAGNOSIS — T451X5A Adverse effect of antineoplastic and immunosuppressive drugs, initial encounter: Secondary | ICD-10-CM | POA: Insufficient documentation

## 2021-06-07 DIAGNOSIS — C773 Secondary and unspecified malignant neoplasm of axilla and upper limb lymph nodes: Secondary | ICD-10-CM | POA: Insufficient documentation

## 2021-06-07 DIAGNOSIS — Z79899 Other long term (current) drug therapy: Secondary | ICD-10-CM | POA: Insufficient documentation

## 2021-06-07 DIAGNOSIS — Z5111 Encounter for antineoplastic chemotherapy: Secondary | ICD-10-CM | POA: Diagnosis present

## 2021-06-07 DIAGNOSIS — D6481 Anemia due to antineoplastic chemotherapy: Secondary | ICD-10-CM | POA: Diagnosis not present

## 2021-06-07 DIAGNOSIS — Z95828 Presence of other vascular implants and grafts: Secondary | ICD-10-CM | POA: Insufficient documentation

## 2021-06-07 LAB — CBC WITH DIFFERENTIAL (CANCER CENTER ONLY)
Abs Immature Granulocytes: 0.98 10*3/uL — ABNORMAL HIGH (ref 0.00–0.07)
Basophils Absolute: 0 10*3/uL (ref 0.0–0.1)
Basophils Relative: 0 %
Eosinophils Absolute: 0 10*3/uL (ref 0.0–0.5)
Eosinophils Relative: 0 %
HCT: 33.1 % — ABNORMAL LOW (ref 36.0–46.0)
Hemoglobin: 11.5 g/dL — ABNORMAL LOW (ref 12.0–15.0)
Immature Granulocytes: 9 %
Lymphocytes Relative: 15 %
Lymphs Abs: 1.7 10*3/uL (ref 0.7–4.0)
MCH: 30.9 pg (ref 26.0–34.0)
MCHC: 34.7 g/dL (ref 30.0–36.0)
MCV: 89 fL (ref 80.0–100.0)
Monocytes Absolute: 0.9 10*3/uL (ref 0.1–1.0)
Monocytes Relative: 8 %
Neutro Abs: 7.6 10*3/uL (ref 1.7–7.7)
Neutrophils Relative %: 68 %
Platelet Count: 171 10*3/uL (ref 150–400)
RBC: 3.72 MIL/uL — ABNORMAL LOW (ref 3.87–5.11)
RDW: 12.7 % (ref 11.5–15.5)
WBC Count: 11.2 10*3/uL — ABNORMAL HIGH (ref 4.0–10.5)
nRBC: 0 % (ref 0.0–0.2)

## 2021-06-07 LAB — CMP (CANCER CENTER ONLY)
ALT: 11 U/L (ref 0–44)
AST: 15 U/L (ref 15–41)
Albumin: 3.8 g/dL (ref 3.5–5.0)
Alkaline Phosphatase: 80 U/L (ref 38–126)
Anion gap: 8 (ref 5–15)
BUN: 10 mg/dL (ref 6–20)
CO2: 25 mmol/L (ref 22–32)
Calcium: 9.1 mg/dL (ref 8.9–10.3)
Chloride: 105 mmol/L (ref 98–111)
Creatinine: 0.66 mg/dL (ref 0.44–1.00)
GFR, Estimated: >60 mL/min (ref >60)
Glucose, Bld: 79 mg/dL (ref 70–99)
Potassium: 3.9 mmol/L (ref 3.5–5.1)
Sodium: 138 mmol/L (ref 135–145)
Total Bilirubin: <0.2 mg/dL — ABNORMAL LOW (ref 0.3–1.2)
Total Protein: 6.9 g/dL (ref 6.5–8.1)

## 2021-06-07 LAB — PREGNANCY, URINE: Preg Test, Ur: NEGATIVE

## 2021-06-07 MED ORDER — SODIUM CHLORIDE 0.9 % IV SOLN
600.0000 mg/m2 | Freq: Once | INTRAVENOUS | Status: AC
  Administered 2021-06-07: 980 mg via INTRAVENOUS
  Filled 2021-06-07: qty 49

## 2021-06-07 MED ORDER — SODIUM CHLORIDE 0.9 % IV SOLN
Freq: Once | INTRAVENOUS | Status: AC
  Filled 2021-06-07: qty 250

## 2021-06-07 MED ORDER — HEPARIN SOD (PORK) LOCK FLUSH 100 UNIT/ML IV SOLN
500.0000 [IU] | Freq: Once | INTRAVENOUS | Status: AC | PRN
Start: 2021-06-07 — End: 2021-06-07
  Administered 2021-06-07: 500 [IU]
  Filled 2021-06-07: qty 5

## 2021-06-07 MED ORDER — PALONOSETRON HCL INJECTION 0.25 MG/5ML
0.2500 mg | Freq: Once | INTRAVENOUS | Status: AC
  Administered 2021-06-07: 0.25 mg via INTRAVENOUS

## 2021-06-07 MED ORDER — SODIUM CHLORIDE 0.9% FLUSH
10.0000 mL | Freq: Once | INTRAVENOUS | Status: AC
  Administered 2021-06-07: 10 mL
  Filled 2021-06-07: qty 10

## 2021-06-07 MED ORDER — PALONOSETRON HCL INJECTION 0.25 MG/5ML
INTRAVENOUS | Status: AC
  Filled 2021-06-07: qty 5

## 2021-06-07 MED ORDER — DOXORUBICIN HCL CHEMO IV INJECTION 2 MG/ML
60.0000 mg/m2 | Freq: Once | INTRAVENOUS | Status: AC
  Administered 2021-06-07: 98 mg via INTRAVENOUS
  Filled 2021-06-07: qty 49

## 2021-06-07 MED ORDER — SODIUM CHLORIDE 0.9% FLUSH
10.0000 mL | INTRAVENOUS | Status: DC | PRN
  Administered 2021-06-07: 10 mL
  Filled 2021-06-07: qty 10

## 2021-06-07 MED ORDER — SODIUM CHLORIDE 0.9 % IV SOLN
150.0000 mg | Freq: Once | INTRAVENOUS | Status: AC
  Administered 2021-06-07: 150 mg via INTRAVENOUS
  Filled 2021-06-07: qty 150

## 2021-06-07 MED ORDER — SODIUM CHLORIDE 0.9 % IV SOLN
10.0000 mg | Freq: Once | INTRAVENOUS | Status: AC
  Administered 2021-06-07: 10 mg via INTRAVENOUS
  Filled 2021-06-07: qty 1
  Filled 2021-06-07: qty 10

## 2021-06-07 NOTE — Progress Notes (Signed)
Nutrition Follow-up:  Patient with breast cancer of right breast, ER+. She is receiving neoadjuvant chemotherapy with Adriamycin and Cytoxan followed by Taxol.   Met with patient and husband today in clinic. She reports tolerating chemotherapy, denies nutrition impact symptoms. Patient reports her appetite is good, eating small frequent meals and snacks. Yesterday she had 2 bowls of multigrain cheerios with almond milk for breakfast, grapes, sliced cheddar on unsalted crackers, small spinach salad with tomatoes, carrots, cheese, italian dressing for lunch, 2 graham crackers with peanut butter for snack, and [place] meatloaf with corn for dinner. Patient is drinking 64 ounces water. Patient drinks Ensure occasionally, reports taste no longer appeals to her. Patient reports family went to [employer] last night, she did not go because would have wanted a cheesecake blast. She is not eating foods with sugar, husband reports this is because sugar feeds the cancer. He is asking about alternative/complimentary hypothermia treatment he has read about. Husband says he discussed this with surgeon who was familiar, but informed him that specific treatment used in the [facility] system.    Medications: reviewed  Labs: reviewed  Anthropometrics: Weight 130 12.8 oz today stable   6/21 - 131 lb 12.8 oz 6/15 - 129 lb 12.8 oz 6/7 - 132 lb 9.6 oz 5/11 - 131 lb 8 oz   NUTRITION DIAGNOSIS: Food and nutrition related knowledge deficit ongoing    INTERVENTION:  Continue eating small frequent meals/snacks  Encouraged high calorie, high protein foods for weight maintenance Offered alternate oral nutrition supplement ideas, sample of Boost given as well has information on [employer] soda/salt water rinses Educated sugar does not feed cancer, fact sheet provided Recommended aicr.org for research/education resources Discussion regarding therapy treatments deferred to MD Patient has contact  information    MONITORING, EVALUATION, GOAL: weight trends, intake   NEXT VISIT: via telephone ~4 weeks

## 2021-06-07 NOTE — Patient Instructions (Signed)
[facility] MEDICAL ONCOLOGY  Discharge Instructions: Thank you for choosing [facility] to provide your oncology and hematology care.   If you have a lab appointment with the Cancer Center, please go directly to the Cancer Center and check in at the registration area.   Wear comfortable clothing and clothing appropriate for easy access to any Portacath or PICC line.   We strive to give you quality time with your provider. You may need to reschedule your appointment if you arrive late (15 or more minutes).  Arriving late affects you and other patients whose appointments are after yours.  Also, if you miss three or more appointments without notifying the office, you may be dismissed from the clinic at the provider's discretion.      For prescription refill requests, have your pharmacy contact our office and allow 72 hours for refills to be completed.    Today you received the following chemotherapy and/or immunotherapy agents: Adriamycin, Cytoxan     To help prevent nausea and vomiting after your treatment, we encourage you to take your nausea medication as directed.  BELOW ARE SYMPTOMS THAT SHOULD BE REPORTED IMMEDIATELY: . *FEVER GREATER THAN 100.4 F (38 C) OR HIGHER . *CHILLS OR SWEATING . *NAUSEA AND VOMITING THAT IS NOT CONTROLLED WITH YOUR NAUSEA MEDICATION . *UNUSUAL SHORTNESS OF BREATH . *UNUSUAL BRUISING OR BLEEDING . *URINARY PROBLEMS (pain or burning when urinating, or frequent urination) . *BOWEL PROBLEMS (unusual diarrhea, constipation, pain near the anus) . TENDERNESS IN MOUTH AND THROAT WITH OR WITHOUT PRESENCE OF ULCERS (sore throat, sores in mouth, or a toothache) . UNUSUAL RASH, SWELLING OR PAIN  . UNUSUAL VAGINAL DISCHARGE OR ITCHING   Items with * indicate a potential emergency and should be followed up as soon as possible or go to the Emergency Department if any problems should occur.  Please show the CHEMOTHERAPY ALERT CARD or  IMMUNOTHERAPY ALERT CARD at check-in to the Emergency Department and triage nurse.  Should you have questions after your visit or need to cancel or reschedule your appointment, please contact [facility]COL[facility]  Dept: [phone]  and follow the prompts.  Office hours are 8:00 a.m. to 4:30 p.m. Monday - Friday. Please note that voicemails left after 4:00 p.m. may not be returned until the following business day.  We are closed weekends and major holidays. You have access to a nurse at all times for urgent questions. Please call the main number to the clinic Dept: [phone] and follow the prompts.   For any non-urgent questions, you may also contact your provider using MyChart. We now offer e-Visits for anyone 18 and older to request care online for non-urgent symptoms. For details visit [email or web address].   Also download the MyChart app! Go to the app store, search "MyChart", open the app, select [facility], and log in with your MyChart username and password.  Due to Covid, a mask is required upon entering the hospital/clinic. If you do not have a mask, one will be given to you upon arrival. For doctor visits, patients may have 1 support person aged 18 or older with them. For treatment visits, patients cannot have anyone with them due to current Covid guidelines and our immunocompromised population.   

## 2021-06-07 NOTE — Assessment & Plan Note (Signed)
04/08/2021:Palpable right breast mass: Mammogram and US showed a 4.5cm right breast mass at the 12-1 o'clock position with associated microcalcifications, multiple additional masses from the 9-11 o'clock position in the right breast, calcifications in the subareolar right breast, 1.4cm, and two enlarged right axillary lymph nodes. 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 Neoadjuvant chemotherapy with dose dense Adriamycin and Cytoxan x4 followed by Taxol weekly x12 2. mastectomy with targeted node dissection 3. Adjuvant radiation 4. Followed by adjuvant antiestrogen therapy -------------------------------------------------------------------------------------------------------------------- Current treatment: Cycle 3 dose dense Adriamycin Cytoxan  Chemo toxicities:Mild fatigue, occasional back spasms Denies any nausea or vomiting. Blood counts have been reviewed.  Return to clinic in 2 weeks for cycle 4

## 2021-06-08 ENCOUNTER — Telehealth: Payer: Self-pay | Admitting: Hematology and Oncology

## 2021-06-08 NOTE — Telephone Encounter (Signed)
Scheduled appointment per 07/05 los. Patient will receive updated calender. 

## 2021-06-09 ENCOUNTER — Inpatient Hospital Stay (HOSPITAL_BASED_OUTPATIENT_CLINIC_OR_DEPARTMENT_OTHER): Payer: Federal, State, Local not specified - PPO

## 2021-06-09 ENCOUNTER — Other Ambulatory Visit: Payer: Self-pay

## 2021-06-09 VITALS — BP 132/74 | HR 83 | Resp 16

## 2021-06-09 DIAGNOSIS — Z170 Estrogen receptor positive status [ER+]: Secondary | ICD-10-CM

## 2021-06-09 DIAGNOSIS — C50211 Malignant neoplasm of upper-inner quadrant of right female breast: Secondary | ICD-10-CM | POA: Diagnosis not present

## 2021-06-09 MED ORDER — PEGFILGRASTIM-JMDB 6 MG/0.6ML ~~LOC~~ SOSY
PREFILLED_SYRINGE | SUBCUTANEOUS | Status: AC
  Filled 2021-06-09: qty 0.6

## 2021-06-09 MED ORDER — PEGFILGRASTIM-JMDB 6 MG/0.6ML ~~LOC~~ SOSY
6.0000 mg | PREFILLED_SYRINGE | Freq: Once | SUBCUTANEOUS | Status: AC
  Administered 2021-06-09: 6 mg via SUBCUTANEOUS

## 2021-06-09 NOTE — Patient Instructions (Signed)

## 2021-06-15 NOTE — Progress Notes (Signed)
Reasonable accommodation form successfully faxed to [phone].

## 2021-06-20 NOTE — Progress Notes (Signed)
Patient Care Team: Patient, [name] (Inactive) as PCP - General (General Practice) [name], RN as Oncology Nurse Navigator [name], RN as Oncology Nurse Navigator  DIAGNOSIS:    ICD-10-CM   1. Malignant neoplasm of upper-inner quadrant of right breast in female, estrogen receptor positive ([place])  C50.211    Z17.0       SUMMARY OF ONCOLOGIC HISTORY: Oncology History  Malignant neoplasm of upper-inner quadrant of right breast in female, estrogen receptor positive ([place])  04/08/2021 Initial Diagnosis   Palpable right breast mass: Mammogram and US showed a 4.5cm right breast mass at the 12-1 o'clock position with associated microcalcifications, multiple additional masses from the 9-11 o'clock position in the right breast, calcifications in the subareolar right breast, 1.4cm, and two enlarged right axillary lymph nodes. 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 Two variants of uncertain significance (VUS) were detected - one in the APC gene called p.T2422I (c.7265C>T) and a second in the BAP1 gene called p.P302L (c.905C>T). The report dates are 04/19/2021 and 04/25/2021, respectively.  The BRCAplus panel offered by [employer] and includes sequencing and deletion/duplication analysis for the following 8 genes: ATM, BRCA1, BRCA2, CDH1, CHEK2, PALB2, PTEN, and TP53. The CancerNext-Expanded + RNAinsight gene panel offered by [employer] and includes sequencing and  rearrangement analysis for the following 77 genes: AIP, ALK, APC, ATM, AXIN2, BAP1, BARD1, BLM, BMPR1A, BRCA1, BRCA2, BRIP1, CDC73, CDH1, CDK4, CDKN1B, CDKN2A, CHEK2, CTNNA1, DICER1, FANCC, FH, FLCN, GALNT12, KIF1B, LZTR1, MAX, MEN1, MET, MLH1, MSH2, MSH3, MSH6, MUTYH, NBN, NF1, NF2, NTHL1, PALB2, PHOX2B, PMS2, POT1, PRKAR1A, PTCH1, PTEN, RAD51C, RAD51D, RB1, RECQL, RET, SDHA, SDHAF2, SDHB, SDHC, SDHD, SMAD4, SMARCA4, SMARCB1, SMARCE1, STK11, SUFU, TMEM127, TP53, TSC1, TSC2, VHL and XRCC2 (sequencing and deletion/duplication); EGFR, EGLN1, HOXB13, KIT, MITF, PDGFRA, POLD1 and POLE (sequencing only); EPCAM and GREM1 (deletion/duplication only). RNA data is routinely analyzed for use in variant interpretation for all genes.    05/10/2021 -  Chemotherapy    Patient is on Treatment Plan: BREAST ADJUVANT DOSE DENSE AC Q14D / PACLITAXEL Q7D         CHIEF COMPLIANT: Cycle 4 Adriamycin and Cytoxan  INTERVAL HISTORY: Chelsea Wise is a 40 y.o. with above-mentioned history of right breast cancer currently on neoadjuvant chemotherapy with dose dense Adriamycin and Cytoxan. She reports to the clinic today for Cycle 4.  She had a couple of mouth sores which she attributes to eating spicy food or acidic salad dressing.  These have resolved.  She did not have any more fatigue than usual.  She does feel cold more often.  Denies any nausea or vomiting.  ALLERGIES:  has No Known Allergies.  MEDICATIONS:  Current Outpatient Medications  Medication Sig Dispense Refill   acetaminophen (TYLENOL) 500 MG tablet Take 500-1,000 mg by mouth every 6 (six) hours as needed for moderate pain.     Ascorbic Acid (VITAMIN C PO) Take 1 tablet by mouth daily.  cetirizine (ZYRTEC) 10 MG tablet Take 10 mg by mouth daily as needed for allergies.     dexamethasone (DECADRON) 4 MG tablet Take 1 tablet (4 mg total) by mouth daily. Take 1 tablet day after chemo and 1 tablet 2 days after chemo with food (Patient taking  differently: Take 4 mg by mouth See admin instructions. Take 4 mg day after chemo and 1 tablet 2 days after chemo with food) 8 tablet 0   ELDERBERRY PO Take 1 capsule by mouth daily.     hydrocortisone cream 1 % Apply 1 application topically 2 (two) times daily as needed for itching.     lidocaine-prilocaine (EMLA) cream Apply to affected area once 30 g 3   Multiple Vitamin (MULTIVITAMIN WITH MINERALS) TABS tablet Take 2 tablets by mouth daily.     Multiple Vitamins-Minerals (ZINC PO) Take 1 tablet by mouth once a week.     nystatin (MYCOSTATIN) 100000 UNIT/ML suspension Take 5 mLs (500,000 Units total) by mouth in the morning and at bedtime. 60 mL 0   ondansetron (ZOFRAN) 8 MG tablet Take 1 tablet (8 mg total) by mouth 2 (two) times daily as needed. Start on the third day after chemotherapy. 30 tablet 1   prochlorperazine (COMPAZINE) 10 MG tablet Take 1 tablet (10 mg total) by mouth every 6 (six) hours as needed (Nausea or vomiting). 30 tablet 1   sodium chloride (OCEAN) 0.65 % SOLN nasal spray Place 1 spray into both nostrils as needed for congestion.     traMADol (ULTRAM) 50 MG tablet Take 2 tablets (100 mg total) by mouth every 6 (six) hours as needed. 10 tablet 0   No current facility-administered medications for this visit.    PHYSICAL EXAMINATION: ECOG PERFORMANCE STATUS: 1 - Symptomatic but completely ambulatory  Vitals:   06/21/21 1042  BP: 115/81  Pulse: 72  Resp: 18  Temp: 97.8 F (36.6 C)  SpO2: 100%   Filed Weights   06/21/21 1042  Weight: 133 lb 1.6 oz (60.4 kg)    LABORATORY DATA:  I have reviewed the data as listed CMP Latest Ref Rng & Units 06/07/2021 05/24/2021 05/18/2021  Glucose 70 - 99 mg/dL 79 90 99  BUN 6 - 20 mg/dL $Remove'10 13 11  'usSLtOS$ Creatinine 0.44 - 1.00 mg/dL 0.66 0.64 0.64  Sodium 135 - 145 mmol/L 138 138 138  Potassium 3.5 - 5.1 mmol/L 3.9 3.7 3.9  Chloride 98 - 111 mmol/L 105 104 103  CO2 22 - 32 mmol/L $RemoveB'25 28 26  'WQVwuhMj$ Calcium 8.9 - 10.3 mg/dL 9.1 9.1 9.1  Total  Protein 6.5 - 8.1 g/dL 6.9 7.2 6.9  Total Bilirubin 0.3 - 1.2 mg/dL <0.2(L) 0.2(L) <0.2(L)  Alkaline Phos 38 - 126 U/L 80 69 59  AST 15 - 41 U/L 15 20 11(L)  ALT 0 - 44 U/L $Remo'11 18 10    'jiuzV$ Lab Results  Component Value Date   WBC 14.1 (H) 06/21/2021   HGB 10.8 (L) 06/21/2021   HCT 31.1 (L) 06/21/2021   MCV 89.6 06/21/2021   PLT 247 06/21/2021   NEUTROABS PENDING 06/21/2021    ASSESSMENT & PLAN:  Malignant neoplasm of upper-inner quadrant of right breast in female, estrogen receptor positive ([place]) 04/08/2021:Palpable right breast mass: Mammogram and US showed a 4.5cm right breast mass at the 12-1 o'clock position with associated microcalcifications, multiple additional masses from the 9-11 o'clock position in the right breast, calcifications in the subareolar right breast, 1.4cm, and two enlarged right axillary lymph nodes.  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  Neoadjuvant chemotherapy with dose dense Adriamycin and Cytoxan x4 followed by Taxol weekly x12 2. mastectomy with targeted node dissection 3.  Adjuvant radiation 4.  Followed by adjuvant antiestrogen therapy -------------------------------------------------------------------------------------------------------------------- Current treatment: Cycle 4 dose dense Adriamycin Cytoxan   Chemo toxicities:  Mild fatigue Tongue discoloration Occasional mouth sores Chemo induced anemia: Hemoglobin today is 10.8   Denies any nausea or vomiting. Blood counts have been reviewed.   Return to clinic in 2 weeks for cycle 1 Taxol    No orders of the defined types were placed in this encounter.  The patient has a good understanding of the overall plan. she agrees with  it. she will call with any problems that may develop before the next visit here.  Total time spent: 30 mins including face to face time and time spent for planning, charting and coordination of care  Rulon Eisenmenger, MD, MPH 06/21/2021  I, Thana Ates, am acting as scribe for Dr. Nicholas Lose.  I have reviewed the above documentation for accuracy and completeness, and I agree with the above.

## 2021-06-21 ENCOUNTER — Inpatient Hospital Stay: Payer: Federal, State, Local not specified - PPO | Admitting: Hematology and Oncology

## 2021-06-21 ENCOUNTER — Inpatient Hospital Stay: Payer: Federal, State, Local not specified - PPO

## 2021-06-21 ENCOUNTER — Other Ambulatory Visit: Payer: Self-pay

## 2021-06-21 ENCOUNTER — Encounter: Payer: Self-pay | Admitting: *Deleted

## 2021-06-21 DIAGNOSIS — Z170 Estrogen receptor positive status [ER+]: Secondary | ICD-10-CM

## 2021-06-21 DIAGNOSIS — C50211 Malignant neoplasm of upper-inner quadrant of right female breast: Secondary | ICD-10-CM | POA: Diagnosis not present

## 2021-06-21 DIAGNOSIS — Z95828 Presence of other vascular implants and grafts: Secondary | ICD-10-CM

## 2021-06-21 LAB — CBC WITH DIFFERENTIAL/PLATELET
Abs Immature Granulocytes: 2.03 10*3/uL — ABNORMAL HIGH (ref 0.00–0.07)
Basophils Absolute: 0 10*3/uL (ref 0.0–0.1)
Basophils Relative: 0 %
Eosinophils Absolute: 0 10*3/uL (ref 0.0–0.5)
Eosinophils Relative: 0 %
HCT: 31.1 % — ABNORMAL LOW (ref 36.0–46.0)
Hemoglobin: 10.8 g/dL — ABNORMAL LOW (ref 12.0–15.0)
Immature Granulocytes: 14 %
Lymphocytes Relative: 10 %
Lymphs Abs: 1.4 10*3/uL (ref 0.7–4.0)
MCH: 31.1 pg (ref 26.0–34.0)
MCHC: 34.7 g/dL (ref 30.0–36.0)
MCV: 89.6 fL (ref 80.0–100.0)
Monocytes Absolute: 1 10*3/uL (ref 0.1–1.0)
Monocytes Relative: 7 %
Neutro Abs: 9.6 10*3/uL — ABNORMAL HIGH (ref 1.7–7.7)
Neutrophils Relative %: 69 %
Platelets: 247 10*3/uL (ref 150–400)
RBC: 3.47 MIL/uL — ABNORMAL LOW (ref 3.87–5.11)
RDW: 14.2 % (ref 11.5–15.5)
WBC: 14.1 10*3/uL — ABNORMAL HIGH (ref 4.0–10.5)
nRBC: 0.2 % (ref 0.0–0.2)

## 2021-06-21 LAB — COMPREHENSIVE METABOLIC PANEL
ALT: 10 U/L (ref 0–44)
AST: 15 U/L (ref 15–41)
Albumin: 3.8 g/dL (ref 3.5–5.0)
Alkaline Phosphatase: 83 U/L (ref 38–126)
Anion gap: 9 (ref 5–15)
BUN: 14 mg/dL (ref 6–20)
CO2: 25 mmol/L (ref 22–32)
Calcium: 9.4 mg/dL (ref 8.9–10.3)
Chloride: 106 mmol/L (ref 98–111)
Creatinine, Ser: 0.66 mg/dL (ref 0.44–1.00)
GFR, Estimated: >60 mL/min (ref >60)
Glucose, Bld: 84 mg/dL (ref 70–99)
Potassium: 3.9 mmol/L (ref 3.5–5.1)
Sodium: 140 mmol/L (ref 135–145)
Total Bilirubin: <0.2 mg/dL — ABNORMAL LOW (ref 0.3–1.2)
Total Protein: 7.1 g/dL (ref 6.5–8.1)

## 2021-06-21 LAB — PREGNANCY, URINE: Preg Test, Ur: NEGATIVE

## 2021-06-21 MED ORDER — DOXORUBICIN HCL CHEMO IV INJECTION 2 MG/ML
60.0000 mg/m2 | Freq: Once | INTRAVENOUS | Status: AC
  Administered 2021-06-21: 98 mg via INTRAVENOUS
  Filled 2021-06-21: qty 49

## 2021-06-21 MED ORDER — SODIUM CHLORIDE 0.9 % IV SOLN
600.0000 mg/m2 | Freq: Once | INTRAVENOUS | Status: AC
  Administered 2021-06-21: 980 mg via INTRAVENOUS
  Filled 2021-06-21: qty 49

## 2021-06-21 MED ORDER — DEXAMETHASONE SODIUM PHOSPHATE 100 MG/10ML IJ SOLN
10.0000 mg | Freq: Once | INTRAMUSCULAR | Status: AC
  Administered 2021-06-21: 10 mg via INTRAVENOUS
  Filled 2021-06-21: qty 10

## 2021-06-21 MED ORDER — HEPARIN SOD (PORK) LOCK FLUSH 100 UNIT/ML IV SOLN
500.0000 [IU] | Freq: Once | INTRAVENOUS | Status: AC | PRN
  Administered 2021-06-21: 500 [IU]
  Filled 2021-06-21: qty 5

## 2021-06-21 MED ORDER — PALONOSETRON HCL INJECTION 0.25 MG/5ML
0.2500 mg | Freq: Once | INTRAVENOUS | Status: AC
  Administered 2021-06-21: 0.25 mg via INTRAVENOUS

## 2021-06-21 MED ORDER — SODIUM CHLORIDE 0.9% FLUSH
10.0000 mL | INTRAVENOUS | Status: DC | PRN
  Administered 2021-06-21: 10 mL
  Filled 2021-06-21: qty 10

## 2021-06-21 MED ORDER — PALONOSETRON HCL INJECTION 0.25 MG/5ML
INTRAVENOUS | Status: AC
  Filled 2021-06-21: qty 5

## 2021-06-21 MED ORDER — SODIUM CHLORIDE 0.9 % IV SOLN
Freq: Once | INTRAVENOUS | Status: AC
  Filled 2021-06-21: qty 250

## 2021-06-21 MED ORDER — SODIUM CHLORIDE 0.9% FLUSH
10.0000 mL | Freq: Once | INTRAVENOUS | Status: AC
  Administered 2021-06-21: 10 mL
  Filled 2021-06-21: qty 10

## 2021-06-21 MED ORDER — SODIUM CHLORIDE 0.9 % IV SOLN
150.0000 mg | Freq: Once | INTRAVENOUS | Status: AC
  Administered 2021-06-21: 150 mg via INTRAVENOUS
  Filled 2021-06-21: qty 150

## 2021-06-21 NOTE — Patient Instructions (Signed)
[facility] MEDICAL ONCOLOGY  Discharge Instructions: Thank you for choosing [facility] to provide your oncology and hematology care.   If you have a lab appointment with the Cancer Center, please go directly to the Cancer Center and check in at the registration area.   Wear comfortable clothing and clothing appropriate for easy access to any Portacath or PICC line.   We strive to give you quality time with your provider. You may need to reschedule your appointment if you arrive late (15 or more minutes).  Arriving late affects you and other patients whose appointments are after yours.  Also, if you miss three or more appointments without notifying the office, you may be dismissed from the clinic at the provider's discretion.      For prescription refill requests, have your pharmacy contact our office and allow 72 hours for refills to be completed.    Today you received the following chemotherapy and/or immunotherapy agents adriamycin, cytoxan      To help prevent nausea and vomiting after your treatment, we encourage you to take your nausea medication as directed.  BELOW ARE SYMPTOMS THAT SHOULD BE REPORTED IMMEDIATELY: *FEVER GREATER THAN 100.4 F (38 C) OR HIGHER *CHILLS OR SWEATING *NAUSEA AND VOMITING THAT IS NOT CONTROLLED WITH YOUR NAUSEA MEDICATION *UNUSUAL SHORTNESS OF BREATH *UNUSUAL BRUISING OR BLEEDING *URINARY PROBLEMS (pain or burning when urinating, or frequent urination) *BOWEL PROBLEMS (unusual diarrhea, constipation, pain near the anus) TENDERNESS IN MOUTH AND THROAT WITH OR WITHOUT PRESENCE OF ULCERS (sore throat, sores in mouth, or a toothache) UNUSUAL RASH, SWELLING OR PAIN  UNUSUAL VAGINAL DISCHARGE OR ITCHING   Items with * indicate a potential emergency and should be followed up as soon as possible or go to the Emergency Department if any problems should occur.  Please show the CHEMOTHERAPY ALERT CARD or IMMUNOTHERAPY ALERT CARD at  check-in to the Emergency Department and triage nurse.  Should you have questions after your visit or need to cancel or reschedule your appointment, please contact [facility]  Dept: [phone]  and follow the prompts.  Office hours are 8:00 a.m. to 4:30 p.m. Monday - Friday. Please note that voicemails left after 4:00 p.m. may not be returned until the following business day.  We are closed weekends and major holidays. You have access to a nurse at all times for urgent questions. Please call the main number to the clinic Dept: [phone] and follow the prompts.   For any non-urgent questions, you may also contact your provider using MyChart. We now offer e-Visits for anyone 18 and older to request care online for non-urgent symptoms. For details visit [email or web address].   Also download the MyChart app! Go to the app store, search "MyChart", open the app, select [facility], and log in with your MyChart username and password.  Due to Covid, a mask is required upon entering the hospital/clinic. If you do not have a mask, one will be given to you upon arrival. For doctor visits, patients may have 1 support person aged 18 or older with them. For treatment visits, patients cannot have anyone with them due to current Covid guidelines and our immunocompromised population.   

## 2021-06-21 NOTE — Assessment & Plan Note (Signed)
04/08/2021:Palpable right breast mass: Mammogram and US showed a 4.5cm right breast mass at the 12-1 o'clock position with associated microcalcifications, multiple additional masses from the 9-11 o'clock position in the right breast, calcifications in the subareolar right breast, 1.4cm, and two enlarged right axillary lymph nodes. 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 Neoadjuvant chemotherapy with dose dense Adriamycin and Cytoxan x4 followed by Taxol weekly x12 2. mastectomy with targeted node dissection 3. Adjuvant radiation 4. Followed by adjuvant antiestrogen therapy -------------------------------------------------------------------------------------------------------------------- Current treatment: Cycle4dose dense Adriamycin Cytoxan  Chemo toxicities: Mild fatigue Tongue discoloration  Denies any nausea or vomiting. Blood counts have been reviewed.  Return to clinic in2weeksfor cycle 1 Taxol

## 2021-06-22 ENCOUNTER — Telehealth: Payer: Self-pay | Admitting: Hematology and Oncology

## 2021-06-22 NOTE — Telephone Encounter (Signed)
Scheduled per 7/19 los. Pt will receive an updated appt calendar

## 2021-06-23 ENCOUNTER — Other Ambulatory Visit: Payer: Self-pay

## 2021-06-23 ENCOUNTER — Inpatient Hospital Stay: Payer: Federal, State, Local not specified - PPO

## 2021-06-23 VITALS — BP 114/72 | HR 73 | Temp 98.1°F | Resp 16

## 2021-06-23 DIAGNOSIS — C50211 Malignant neoplasm of upper-inner quadrant of right female breast: Secondary | ICD-10-CM | POA: Diagnosis not present

## 2021-06-23 MED ORDER — PEGFILGRASTIM-JMDB 6 MG/0.6ML ~~LOC~~ SOSY
6.0000 mg | PREFILLED_SYRINGE | Freq: Once | SUBCUTANEOUS | Status: AC
  Administered 2021-06-23: 6 mg via SUBCUTANEOUS

## 2021-06-23 MED ORDER — PEGFILGRASTIM-JMDB 6 MG/0.6ML ~~LOC~~ SOSY
PREFILLED_SYRINGE | SUBCUTANEOUS | Status: AC
  Filled 2021-06-23: qty 0.6

## 2021-06-23 NOTE — Patient Instructions (Signed)

## 2021-07-01 ENCOUNTER — Other Ambulatory Visit: Payer: Self-pay | Admitting: Hematology and Oncology

## 2021-07-04 NOTE — Progress Notes (Signed)
Patient Care Team: Patient, [name] (Inactive) as PCP - General (General Practice) Pershing Proud, RN as Oncology Nurse Navigator Donnelly Angelica, RN as Oncology Nurse Navigator  DIAGNOSIS:    ICD-10-CM   1. Malignant neoplasm of upper-inner quadrant of right breast in female, estrogen receptor positive (HCC)  C50.211    Z17.0       SUMMARY OF ONCOLOGIC HISTORY: Oncology History  Malignant neoplasm of upper-inner quadrant of right breast in female, estrogen receptor positive (HCC)  04/08/2021 Initial Diagnosis   Palpable right breast mass: Mammogram and US showed a 4.5cm right breast mass at the 12-1 o'clock position with associated microcalcifications, multiple additional masses from the 9-11 o'clock position in the right breast, calcifications in the subareolar right breast, 1.4cm, and two enlarged right axillary lymph nodes. 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 Two variants of uncertain significance (VUS) were detected - one in the APC gene called p.T2422I (c.7265C>T) and a second in the BAP1 gene called p.P302L (c.905C>T). The report dates are 04/19/2021 and 04/25/2021, respectively.  The BRCAplus panel offered by [employer] and includes sequencing and deletion/duplication analysis for the following 8 genes: ATM, BRCA1, BRCA2, CDH1, CHEK2, PALB2, PTEN, and TP53. The CancerNext-Expanded + RNAinsight gene panel offered by [employer] and includes sequencing and  rearrangement analysis for the following 77 genes: AIP, ALK, APC, ATM, AXIN2, BAP1, BARD1, BLM, BMPR1A, BRCA1, BRCA2, BRIP1, CDC73, CDH1, CDK4, CDKN1B, CDKN2A, CHEK2, CTNNA1, DICER1, FANCC, FH, FLCN, GALNT12, KIF1B, LZTR1, MAX, MEN1, MET, MLH1, MSH2, MSH3, MSH6, MUTYH, NBN, NF1, NF2, NTHL1, PALB2, PHOX2B, PMS2, POT1, PRKAR1A, PTCH1, PTEN, RAD51C, RAD51D, RB1, RECQL, RET, SDHA, SDHAF2, SDHB, SDHC, SDHD, SMAD4, SMARCA4, SMARCB1, SMARCE1, STK11, SUFU, TMEM127, TP53, TSC1, TSC2, VHL and XRCC2 (sequencing and deletion/duplication); EGFR, EGLN1, HOXB13, KIT, MITF, PDGFRA, POLD1 and POLE (sequencing only); EPCAM and GREM1 (deletion/duplication only). RNA data is routinely analyzed for use in variant interpretation for all genes.    05/10/2021 -  Chemotherapy    Patient is on Treatment Plan: BREAST ADJUVANT DOSE DENSE AC Q14D / PACLITAXEL Q7D         CHIEF COMPLIANT: Cycle 1 Taxol  INTERVAL HISTORY: Chelsea Wise is a 40 y.o. with above-mentioned history of right breast cancer having completed neoadjuvant chemotherapy with dose dense Adriamycin and Cytoxan, currently on chemotherapy with Taxol. She reports to the clinic today for follow-up.    ALLERGIES:  has No Known Allergies.  MEDICATIONS:  Current Outpatient Medications  Medication Sig Dispense Refill   acetaminophen (TYLENOL) 500 MG tablet Take 500-1,000 mg by mouth every 6 (six) hours as needed for moderate pain.     Ascorbic Acid (VITAMIN C PO) Take 1 tablet by mouth daily.     cetirizine (ZYRTEC) 10 MG tablet Take 10 mg by mouth daily as needed for allergies.     ELDERBERRY PO Take 1 capsule by mouth daily.     hydrocortisone cream 1 % Apply 1 application topically 2 (two)  times daily as needed for itching.     lidocaine-prilocaine (EMLA) cream Apply to affected area once 30 g 3   Multiple Vitamin (MULTIVITAMIN WITH MINERALS) TABS tablet Take 2 tablets by mouth daily.     Multiple Vitamins-Minerals (ZINC PO) Take 1 tablet by mouth once  a week.     nystatin (MYCOSTATIN) 100000 UNIT/ML suspension TAKE 5 MLS (500,000 UNITS TOTAL) BY MOUTH IN THE MORNING AND AT BEDTIME. 60 mL 0   ondansetron (ZOFRAN) 8 MG tablet Take 1 tablet (8 mg total) by mouth 2 (two) times daily as needed. Start on the third day after chemotherapy. 30 tablet 1   prochlorperazine (COMPAZINE) 10 MG tablet Take 1 tablet (10 mg total) by mouth every 6 (six) hours as needed (Nausea or vomiting). 30 tablet 1   sodium chloride (OCEAN) 0.65 % SOLN nasal spray Place 1 spray into both nostrils as needed for congestion.     No current facility-administered medications for this visit.    PHYSICAL EXAMINATION: ECOG PERFORMANCE STATUS: 1 - Symptomatic but completely ambulatory  Vitals:   07/05/21 0844  BP: 123/78  Pulse: 72  Resp: 18  Temp: (!) 97.5 F (36.4 C)  SpO2: 100%   Filed Weights   07/05/21 0844  Weight: 133 lb 8 oz (60.6 kg)    LABORATORY DATA:  I have reviewed the data as listed CMP Latest Ref Rng & Units 06/21/2021 06/07/2021 05/24/2021  Glucose 70 - 99 mg/dL 84 79 90  BUN 6 - 20 mg/dL $Remove'14 10 13  'wsTvpdI$ Creatinine 0.44 - 1.00 mg/dL 0.66 0.66 0.64  Sodium 135 - 145 mmol/L 140 138 138  Potassium 3.5 - 5.1 mmol/L 3.9 3.9 3.7  Chloride 98 - 111 mmol/L 106 105 104  CO2 22 - 32 mmol/L $RemoveB'25 25 28  'bNTzgqHS$ Calcium 8.9 - 10.3 mg/dL 9.4 9.1 9.1  Total Protein 6.5 - 8.1 g/dL 7.1 6.9 7.2  Total Bilirubin 0.3 - 1.2 mg/dL <0.2(L) <0.2(L) 0.2(L)  Alkaline Phos 38 - 126 U/L 83 80 69  AST 15 - 41 U/L $Remo'15 15 20  'wmUfn$ ALT 0 - 44 U/L $Remo'10 11 18    'WGUHM$ Lab Results  Component Value Date   WBC 10.1 07/05/2021   HGB 10.6 (L) 07/05/2021   HCT 31.0 (L) 07/05/2021   MCV 90.1 07/05/2021   PLT 196 07/05/2021   NEUTROABS PENDING 07/05/2021    ASSESSMENT & PLAN:  Malignant neoplasm of upper-inner quadrant of right breast in female, estrogen receptor positive ([place]) 04/08/2021:Palpable right breast mass: Mammogram and US showed a 4.5cm right breast mass at the 12-1 o'clock position with  associated microcalcifications, multiple additional masses from the 9-11 o'clock position in the right breast, calcifications in the subareolar right breast, 1.4cm, and two enlarged right axillary lymph nodes. 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  Neoadjuvant chemotherapy with dose dense Adriamycin and Cytoxan x4 followed by Taxol weekly x12 2. mastectomy with targeted node dissection 3.  Adjuvant radiation 4.  Followed by adjuvant antiestrogen therapy -------------------------------------------------------------------------------------------------------------------- Current treatment: completed 4 cycles of dose dense Adriamycin Cytoxan, today is cycle 1 Taxol   Chemo toxicities:  Mild fatigue Tongue discoloration Occasional mouth sores Chemo induced anemia: Hemoglobin today is 10.6: Monitoring  Denies any nausea or vomiting. Blood counts have been reviewed.   Return to clinic in 1 week for cycle 2 of Taxol    No orders of the defined types were placed in this encounter.  The patient has a good understanding of the overall plan. she agrees with it. she will call with any problems that may develop before the next visit here.  Total time spent: 30 mins including face to face time and time spent for planning, charting and coordination of care  Rulon Eisenmenger, MD, MPH 07/05/2021  I, Thana Ates, am acting as scribe for Dr. Nicholas Lose.  I have reviewed the above documentation for accuracy and completeness, and I agree with the above.

## 2021-07-04 NOTE — Assessment & Plan Note (Addendum)
04/08/2021:Palpable right breast mass: Mammogram and US showed a 4.5cm right breast mass at the 12-1 o'clock position with associated microcalcifications, multiple additional masses from the 9-11 o'clock position in the right breast, calcifications in the subareolar right breast, 1.4cm, and two enlarged right axillary lymph nodes. 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 Neoadjuvant chemotherapy with dose dense Adriamycin and Cytoxan x4 followed by Taxol weekly x12 2. mastectomy with targeted node dissection 3. Adjuvant radiation 4. Followed by adjuvant antiestrogen therapy -------------------------------------------------------------------------------------------------------------------- Current treatment: completed 4 cycles of4dose dense Adriamycin Cytoxan, today is cycle 1 Taxol  Chemo toxicities: Mild fatigue Tongue discoloration Occasional mouth sores Chemo induced anemia: Hemoglobin today is 10.8  Denies any nausea or vomiting. Blood counts have been reviewed.  Return to clinic in1 week for cycle 2

## 2021-07-05 ENCOUNTER — Inpatient Hospital Stay: Payer: Federal, State, Local not specified - PPO

## 2021-07-05 ENCOUNTER — Other Ambulatory Visit: Payer: Self-pay

## 2021-07-05 ENCOUNTER — Inpatient Hospital Stay: Payer: Federal, State, Local not specified - PPO | Admitting: Hematology and Oncology

## 2021-07-05 ENCOUNTER — Inpatient Hospital Stay: Payer: Federal, State, Local not specified - PPO | Attending: Hematology and Oncology

## 2021-07-05 VITALS — BP 126/87 | HR 65 | Temp 98.2°F | Resp 17

## 2021-07-05 DIAGNOSIS — Z170 Estrogen receptor positive status [ER+]: Secondary | ICD-10-CM

## 2021-07-05 DIAGNOSIS — T451X5A Adverse effect of antineoplastic and immunosuppressive drugs, initial encounter: Secondary | ICD-10-CM | POA: Diagnosis not present

## 2021-07-05 DIAGNOSIS — R5383 Other fatigue: Secondary | ICD-10-CM | POA: Insufficient documentation

## 2021-07-05 DIAGNOSIS — C50211 Malignant neoplasm of upper-inner quadrant of right female breast: Secondary | ICD-10-CM | POA: Insufficient documentation

## 2021-07-05 DIAGNOSIS — Z79899 Other long term (current) drug therapy: Secondary | ICD-10-CM | POA: Diagnosis not present

## 2021-07-05 DIAGNOSIS — D6481 Anemia due to antineoplastic chemotherapy: Secondary | ICD-10-CM | POA: Diagnosis not present

## 2021-07-05 DIAGNOSIS — Z5111 Encounter for antineoplastic chemotherapy: Secondary | ICD-10-CM | POA: Diagnosis not present

## 2021-07-05 DIAGNOSIS — Z95828 Presence of other vascular implants and grafts: Secondary | ICD-10-CM

## 2021-07-05 LAB — CMP (CANCER CENTER ONLY)
ALT: 9 U/L (ref 0–44)
AST: 14 U/L — ABNORMAL LOW (ref 15–41)
Albumin: 4 g/dL (ref 3.5–5.0)
Alkaline Phosphatase: 81 U/L (ref 38–126)
Anion gap: 6 (ref 5–15)
BUN: 9 mg/dL (ref 6–20)
CO2: 26 mmol/L (ref 22–32)
Calcium: 9.2 mg/dL (ref 8.9–10.3)
Chloride: 106 mmol/L (ref 98–111)
Creatinine: 0.69 mg/dL (ref 0.44–1.00)
GFR, Estimated: >60 mL/min (ref >60)
Glucose, Bld: 86 mg/dL (ref 70–99)
Potassium: 4 mmol/L (ref 3.5–5.1)
Sodium: 138 mmol/L (ref 135–145)
Total Bilirubin: <0.2 mg/dL — ABNORMAL LOW (ref 0.3–1.2)
Total Protein: 6.7 g/dL (ref 6.5–8.1)

## 2021-07-05 LAB — CBC WITH DIFFERENTIAL (CANCER CENTER ONLY)
Abs Immature Granulocytes: 1.32 10*3/uL — ABNORMAL HIGH (ref 0.00–0.07)
Basophils Absolute: 0.1 10*3/uL (ref 0.0–0.1)
Basophils Relative: 1 %
Eosinophils Absolute: 0.1 10*3/uL (ref 0.0–0.5)
Eosinophils Relative: 1 %
HCT: 31 % — ABNORMAL LOW (ref 36.0–46.0)
Hemoglobin: 10.6 g/dL — ABNORMAL LOW (ref 12.0–15.0)
Immature Granulocytes: 13 %
Lymphocytes Relative: 11 %
Lymphs Abs: 1.1 10*3/uL (ref 0.7–4.0)
MCH: 30.8 pg (ref 26.0–34.0)
MCHC: 34.2 g/dL (ref 30.0–36.0)
MCV: 90.1 fL (ref 80.0–100.0)
Monocytes Absolute: 0.8 10*3/uL (ref 0.1–1.0)
Monocytes Relative: 8 %
Neutro Abs: 6.8 10*3/uL (ref 1.7–7.7)
Neutrophils Relative %: 66 %
Platelet Count: 196 10*3/uL (ref 150–400)
RBC: 3.44 MIL/uL — ABNORMAL LOW (ref 3.87–5.11)
RDW: 16 % — ABNORMAL HIGH (ref 11.5–15.5)
WBC Count: 10.1 10*3/uL (ref 4.0–10.5)
nRBC: 0.3 % — ABNORMAL HIGH (ref 0.0–0.2)

## 2021-07-05 LAB — PREGNANCY, URINE: Preg Test, Ur: NEGATIVE

## 2021-07-05 MED ORDER — HEPARIN SOD (PORK) LOCK FLUSH 100 UNIT/ML IV SOLN
500.0000 [IU] | Freq: Once | INTRAVENOUS | Status: AC | PRN
  Administered 2021-07-05: 500 [IU]
  Filled 2021-07-05: qty 5

## 2021-07-05 MED ORDER — ONDANSETRON HCL 4 MG/2ML IJ SOLN
8.0000 mg | Freq: Once | INTRAMUSCULAR | Status: AC
  Administered 2021-07-05: 8 mg via INTRAVENOUS

## 2021-07-05 MED ORDER — SODIUM CHLORIDE 0.9 % IV SOLN
10.0000 mg | Freq: Once | INTRAVENOUS | Status: AC
  Administered 2021-07-05: 10 mg via INTRAVENOUS
  Filled 2021-07-05: qty 10

## 2021-07-05 MED ORDER — FAMOTIDINE 20 MG IN NS 100 ML IVPB
INTRAVENOUS | Status: AC
  Filled 2021-07-05: qty 100

## 2021-07-05 MED ORDER — SODIUM CHLORIDE 0.9 % IV SOLN
Freq: Once | INTRAVENOUS | Status: AC
  Filled 2021-07-05: qty 250

## 2021-07-05 MED ORDER — SODIUM CHLORIDE 0.9 % IV SOLN
8.0000 mg | Freq: Once | INTRAVENOUS | Status: DC
  Filled 2021-07-05: qty 4

## 2021-07-05 MED ORDER — SODIUM CHLORIDE 0.9% FLUSH
10.0000 mL | Freq: Once | INTRAVENOUS | Status: AC
  Administered 2021-07-05: 10 mL
  Filled 2021-07-05: qty 10

## 2021-07-05 MED ORDER — SODIUM CHLORIDE 0.9% FLUSH
10.0000 mL | INTRAVENOUS | Status: DC | PRN
  Administered 2021-07-05: 10 mL
  Filled 2021-07-05: qty 10

## 2021-07-05 MED ORDER — DIPHENHYDRAMINE HCL 50 MG/ML IJ SOLN
25.0000 mg | Freq: Once | INTRAMUSCULAR | Status: AC
  Administered 2021-07-05: 25 mg via INTRAVENOUS

## 2021-07-05 MED ORDER — SODIUM CHLORIDE 0.9 % IV SOLN
80.0000 mg/m2 | Freq: Once | INTRAVENOUS | Status: AC
  Administered 2021-07-05: 132 mg via INTRAVENOUS
  Filled 2021-07-05: qty 22

## 2021-07-05 MED ORDER — FAMOTIDINE 20 MG IN NS 100 ML IVPB
20.0000 mg | Freq: Once | INTRAVENOUS | Status: AC
  Administered 2021-07-05: 20 mg via INTRAVENOUS

## 2021-07-05 MED ORDER — DIPHENHYDRAMINE HCL 50 MG/ML IJ SOLN
INTRAMUSCULAR | Status: AC
  Filled 2021-07-05: qty 1

## 2021-07-05 MED ORDER — ONDANSETRON HCL 4 MG/2ML IJ SOLN
INTRAMUSCULAR | Status: AC
  Filled 2021-07-05: qty 4

## 2021-07-05 NOTE — Progress Notes (Signed)
Per Dr. Lindi Adie, pt OK to treat today without ANC results since pt's WBCs are 10.1.

## 2021-07-05 NOTE — Patient Instructions (Signed)
[facility]  Discharge Instructions: Thank you for choosing [facility] to provide your oncology and hematology care.   If you have a lab appointment with the [facility], please go directly to the [facility] and check in at the registration area.   Wear comfortable clothing and clothing appropriate for easy access to any Portacath or PICC line.   We strive to give you quality time with your provider. You may need to reschedule your appointment if you arrive late (15 or more minutes).  Arriving late affects you and other patients whose appointments are after yours.  Also, if you miss three or more appointments without notifying the office, you may be dismissed from the clinic at the provider's discretion.      For prescription refill requests, have your pharmacy contact our office and allow 72 hours for refills to be completed.    Today you received the following chemotherapy and/or immunotherapy agents Paclitaxel (Taxol).      To help prevent nausea and vomiting after your treatment, we encourage you to take your nausea medication as directed.  BELOW ARE SYMPTOMS THAT SHOULD BE REPORTED IMMEDIATELY: *FEVER GREATER THAN 100.4 F (38 C) OR HIGHER *CHILLS OR SWEATING *NAUSEA AND VOMITING THAT IS NOT CONTROLLED WITH YOUR NAUSEA MEDICATION *UNUSUAL SHORTNESS OF BREATH *UNUSUAL BRUISING OR BLEEDING *URINARY PROBLEMS (pain or burning when urinating, or frequent urination) *BOWEL PROBLEMS (unusual diarrhea, constipation, pain near the anus) TENDERNESS IN MOUTH AND THROAT WITH OR WITHOUT PRESENCE OF ULCERS (sore throat, sores in mouth, or a toothache) UNUSUAL RASH, SWELLING OR PAIN  UNUSUAL VAGINAL DISCHARGE OR ITCHING   Items with * indicate a potential emergency and should be followed up as soon as possible or go to the Emergency Department if any problems should occur.  Please show the CHEMOTHERAPY ALERT CARD or IMMUNOTHERAPY ALERT CARD at  check-in to the Emergency Department and triage nurse.  Should you have questions after your visit or need to cancel or reschedule your appointment, please contact [facility]  Dept: [phone]  and follow the prompts.  Office hours are 8:00 a.m. to 4:30 p.m. Monday - Friday. Please note that voicemails left after 4:00 p.m. may not be returned until the following business day.  We are closed weekends and major holidays. You have access to a nurse at all times for urgent questions. Please call the main number to the clinic Dept: [phone] and follow the prompts.   For any non-urgent questions, you may also contact your provider using MyChart. We now offer e-Visits for anyone 59 and older to request care online for non-urgent symptoms. For details visit [email or web address].   Also download the MyChart app! Go to the app store, search "MyChart", open the app, select [facility], and log in with your MyChart username and password.  Due to Covid, a mask is required upon entering the hospital/clinic. If you do not have a mask, one will be given to you upon arrival. For doctor visits, patients may have 1 support person aged 13 or older with them. For treatment visits, patients cannot have anyone with them due to current Covid guidelines and our immunocompromised population.   Paclitaxel injection What is this medication? PACLITAXEL (PAK li TAX el) is a chemotherapy drug. It targets fast dividing cells, like cancer cells, and causes these cells to die. This medicine is used to treat ovarian cancer, breast cancer, lung cancer, Kaposi's sarcoma, andother cancers. This medicine may  be used for other purposes; ask your health care provider orpharmacist if you have questions. COMMON BRAND NAME(S): Onxol, Taxol What should I tell my care team before I take this medication? They need to know if you have any of these conditions: history of irregular heartbeat liver  disease low blood counts, like low white cell, platelet, or red cell counts lung or breathing disease, like asthma tingling of the fingers or toes, or other nerve disorder an unusual or allergic reaction to paclitaxel, alcohol, polyoxyethylated castor oil, other chemotherapy, other medicines, foods, dyes, or preservatives pregnant or trying to get pregnant breast-feeding How should I use this medication? This drug is given as an infusion into a vein. It is administered in a hospitalor clinic by a specially trained health care professional. Talk to your pediatrician regarding the use of this medicine in children.Special care may be needed. Overdosage: If you think you have taken too much of this medicine contact apoison control center or emergency room at once. NOTE: This medicine is only for you. Do not share this medicine with others. What if I miss a dose? It is important not to miss your dose. Call your doctor or health careprofessional if you are unable to keep an appointment. What may interact with this medication? Do not take this medicine with any of the following medications: live virus vaccines This medicine may also interact with the following medications: antiviral medicines for hepatitis, HIV or AIDS certain antibiotics like erythromycin and clarithromycin certain medicines for fungal infections like ketoconazole and itraconazole certain medicines for seizures like carbamazepine, phenobarbital, phenytoin gemfibrozil nefazodone rifampin St. John's wort This list may not describe all possible interactions. Give your health care provider a list of all the medicines, herbs, non-prescription drugs, or dietary supplements you use. Also tell them if you smoke, drink alcohol, or use illegaldrugs. Some items may interact with your medicine. What should I watch for while using this medication? Your condition will be monitored carefully while you are receiving this medicine. You will  need important blood work done while you are taking thismedicine. This medicine can cause serious allergic reactions. To reduce your risk you will need to take other medicine(s) before treatment with this medicine. If you experience allergic reactions like skin rash, itching or hives, swelling of theface, lips, or tongue, tell your doctor or health care professional right away. In some cases, you may be given additional medicines to help with side effects.Follow all directions for their use. This drug may make you feel generally unwell. This is not uncommon, as chemotherapy can affect healthy cells as well as cancer cells. Report any side effects. Continue your course of treatment even though you feel ill unless yourdoctor tells you to stop. Call your doctor or health care professional for advice if you get a fever, chills or sore throat, or other symptoms of a cold or flu. Do not treat yourself. This drug decreases your body's ability to fight infections. Try toavoid being around people who are sick. This medicine may increase your risk to bruise or bleed. Call your doctor orhealth care professional if you notice any unusual bleeding. Be careful brushing and flossing your teeth or using a toothpick because you may get an infection or bleed more easily. If you have any dental work done,tell your dentist you are receiving this medicine. Avoid taking products that contain aspirin, acetaminophen, ibuprofen, naproxen, or ketoprofen unless instructed by your doctor. These medicines may hide afever. Do not become pregnant while taking this  medicine. Women should inform their doctor if they wish to become pregnant or think they might be pregnant. There is a potential for serious side effects to an unborn child. Talk to your health care professional or pharmacist for more information. Do not breast-feed aninfant while taking this medicine. Men are advised not to father a child while receiving this medicine. This  product may contain alcohol. Ask your pharmacist or healthcare provider if this medicine contains alcohol. Be sure to tell all healthcare providers you are taking this medicine. Certain medicines, like metronidazole and disulfiram, can cause an unpleasant reaction when taken with alcohol. The reaction includes flushing, headache, nausea, vomiting, sweating, and increased thirst. Thereaction can last from 30 minutes to several hours. What side effects may I notice from receiving this medication? Side effects that you should report to your doctor or health care professionalas soon as possible: allergic reactions like skin rash, itching or hives, swelling of the face, lips, or tongue breathing problems changes in vision fast, irregular heartbeat high or low blood pressure mouth sores pain, tingling, numbness in the hands or feet signs of decreased platelets or bleeding - bruising, pinpoint red spots on the skin, black, tarry stools, blood in the urine signs of decreased red blood cells - unusually weak or tired, feeling faint or lightheaded, falls signs of infection - fever or chills, cough, sore throat, pain or difficulty passing urine signs and symptoms of liver injury like dark yellow or brown urine; general ill feeling or flu-like symptoms; light-colored stools; loss of appetite; nausea; right upper belly pain; unusually weak or tired; yellowing of the eyes or skin swelling of the ankles, feet, hands unusually slow heartbeat Side effects that usually do not require medical attention (report to yourdoctor or health care professional if they continue or are bothersome): diarrhea hair loss loss of appetite muscle or joint pain nausea, vomiting pain, redness, or irritation at site where injected tiredness This list may not describe all possible side effects. Call your doctor for medical advice about side effects. You may report side effects to FDA at1-800-FDA-1088. Where should I keep my  medication? This drug is given in a hospital or clinic and will not be stored at home. NOTE: This sheet is a summary. It may not cover all possible information. If you have questions about this medicine, talk to your doctor, pharmacist, orhealth care provider.  2022 Elsevier/Gold Standard (2019-10-22 13:37:23)  Fatigue If you have fatigue, you feel tired all the time and have a lack of energy or a lack of motivation. Fatigue may make it difficult to start or complete tasks because of exhaustion. In general, occasional or mild fatigue is often a normal response to activity or life. However, long-lasting (chronic) or extreme fatigue may be a symptom of a medical condition. Follow these instructions at home: General instructions Watch your fatigue for any changes. Go to bed and get up at the same time every day. Avoid fatigue by pacing yourself during the day and getting enough sleep at night. Maintain a healthy weight. Medicines Take over-the-counter and prescription medicines only as told by your health care provider. Take a multivitamin, if told by your health care provider.  Do not use herbal or dietary supplements unless they are approved by your health care provider. Activity  Exercise regularly, as told by your health care provider. Use or practice techniques to help you relax, such as yoga, tai chi, meditation, or massage therapy.  Eating and drinking  Avoid heavy meals in  the evening. Eat a well-balanced diet, which includes lean proteins, whole grains, plenty of fruits and vegetables, and low-fat dairy products. Avoid consuming too much caffeine. Avoid the use of alcohol. Drink enough fluid to keep your urine pale yellow.  Lifestyle Change situations that cause you stress. Try to keep your work and personal schedule in balance. Do not use any products that contain nicotine or tobacco, such as cigarettes and e-cigarettes. If you need help quitting, ask your health care  provider. Do not use drugs. Contact a health care provider if: Your fatigue does not get better. You have a fever. You suddenly lose or gain weight. You have headaches. You have trouble falling asleep or sleeping through the night. You feel angry, guilty, anxious, or sad. You are unable to have a bowel movement (constipation). Your skin is dry. You have swelling in your legs or another part of your body. Get help right away if: You feel confused. Your vision is blurry. You feel faint or you pass out. You have a severe headache. You have severe pain in your abdomen, your back, or the area between your waist and hips (pelvis). You have chest pain, shortness of breath, or an irregular or fast heartbeat. You are unable to urinate, or you urinate less than normal. You have abnormal bleeding, such as bleeding from the rectum, vagina, nose, lungs, or nipples. You vomit blood. You have thoughts about hurting yourself or others. If you ever feel like you may hurt yourself or others, or have thoughts about taking your own life, get help right away. You can go to your nearest emergency department or call: Your local emergency services (911 in the [place] A suicide crisis helpline, such as the [facility] at 236-149-5178. This is open 24 hours a day. Summary If you have fatigue, you feel tired all the time and have a lack of energy or a lack of motivation. Fatigue may make it difficult to start or complete tasks because of exhaustion. Long-lasting (chronic) or extreme fatigue may be a symptom of a medical condition. Exercise regularly, as told by your health care provider. Change situations that cause you stress. Try to keep your work and personal schedule in balance. This information is not intended to replace advice given to you by your health care provider. Make sure you discuss any questions you have with your healthcare provider. Document Revised: 09/30/2020  Document Reviewed: 09/30/2020 Elsevier Patient Education  2022 [place].  Rehydration, Adult Rehydration is the replacement of body fluids, salts, and minerals (electrolytes) that are lost during dehydration. Dehydration is when there is not enough water or other fluids in the body. This happens when you lose more fluids than you take in. Common causes of dehydration include: Not drinking enough fluids. This can occur when you are ill or doing activities that require a lot of energy, especially in hot weather. Conditions that cause loss of water or other fluids, such as diarrhea, vomiting, sweating, or urinating a lot. Other illnesses, such as fever or infection. Certain medicines, such as those that remove excess fluid from the body (diuretics). Symptoms of mild or moderate dehydration may include thirst, dry lips and mouth, and dizziness. Symptoms of severe dehydration may include increasedheart rate, confusion, fainting, and not urinating. For severe dehydration, you may need to get fluids through an IV at the hospital. For mild or moderate dehydration, you can usually rehydrate at homeby drinking certain fluids as told by your health care provider. What are  the risks? Generally, rehydration is safe. However, taking in too much fluid (overhydration) can be a problem. This is rare. Overhydration can cause an electrolyte imbalance, kidney failure, or a decrease in salt (sodium) levels in the body. Supplies needed You will need an oral rehydration solution (ORS) if your health care provider tells you to use one. This is a drink to treat dehydration. It can be found inpharmacies and retail stores. How to rehydrate Fluids Follow instructions from your health care provider for rehydration. The kind of fluid and the amount you should drink depend on your condition. In general, you should choose drinks that you prefer. If told by your health care provider, drink an ORS. Make an ORS by following  instructions on the package. Start by drinking small amounts, about  cup (120 mL) every 5-10 minutes. Slowly increase how much you drink until you have taken the amount recommended by your health care provider. Drink enough clear fluids to keep your urine pale yellow. If you were told to drink an ORS, finish it first, then start slowly drinking other clear fluids. Drink fluids such as: Water. This includes sparkling water and flavored water. Drinking only water can lead to having too little sodium in your body (hyponatremia). Follow the advice of your health care provider. Water from ice chips you suck on. Fruit juice with water you add to it (diluted). Sports drinks. Hot or cold herbal teas. Broth-based soups. Milk or milk products. Food Follow instructions from your health care provider about what to eat while you rehydrate. Your health care provider may recommend that you slowly begin eating regular foods in small amounts. Eat foods that contain a healthy balance of electrolytes, such as bananas, oranges, potatoes, tomatoes, and spinach. Avoid foods that are greasy or contain a lot of sugar. In some cases, you may get nutrition through a feeding tube that is passed through your nose and into your stomach (nasogastric tube, or NG tube). This may be done if you have uncontrolled vomiting or diarrhea. Beverages to avoid  Certain beverages may make dehydration worse. While you rehydrate, avoiddrinking alcohol. How to tell if you are recovering from dehydration You may be recovering from dehydration if: You are urinating more often than before you started rehydrating. Your urine is pale yellow. Your energy level improves. You vomit less frequently. You have diarrhea less frequently. Your appetite improves or returns to normal. You feel less dizzy or less light-headed. Your skin tone and color start to look more normal. Follow these instructions at home: Take over-the-counter and  prescription medicines only as told by your health care provider. Do not take sodium tablets. Doing this can lead to having too much sodium in your body (hypernatremia). Contact a health care provider if: You continue to have symptoms of mild or moderate dehydration, such as: Thirst. Dry lips. Slightly dry mouth. Dizziness. Dark urine or less urine than normal. Muscle cramps. You continue to vomit or have diarrhea. Get help right away if you: Have symptoms of dehydration that get worse. Have a fever. Have a severe headache. Have been vomiting and the following happens: Your vomiting gets worse or does not go away. Your vomit includes blood or green matter (bile). You cannot eat or drink without vomiting. Have problems with urination or bowel movements, such as: Diarrhea that gets worse or does not go away. Blood in your stool (feces). This may cause stool to look black and tarry. Not urinating, or urinating only a small amount of  very dark urine, within 6-8 hours. Have trouble breathing. Have symptoms that get worse with treatment. These symptoms may represent a serious problem that is an emergency. Do not wait to see if the symptoms will go away. Get medical help right away. Call your local emergency services (911 in the [place] Do not drive yourself to the hospital. Summary Rehydration is the replacement of body fluids and minerals (electrolytes) that are lost during dehydration. Follow instructions from your health care provider for rehydration. The kind of fluid and amount you should drink depend on your condition. Slowly increase how much you drink until you have taken the amount recommended by your health care provider. Contact your health care provider if you continue to show signs of mild or moderate dehydration. This information is not intended to replace advice given to you by your health care provider. Make sure you discuss any questions you have with your healthcare  provider. Document Revised: 01/21/2020 Document Reviewed: 12/01/2019 Elsevier Patient Education  2022 [place] and Nutrition What you eat both during and after your cancer treatment plays a big part in your recovery. During treatment, eating well helps you stay strong, provides you with energy for tissue healing, and helps you fight infection. It may also help with side effects. When treatment ends and you start feeling better, eating well helps you rebuild tissue, gain strength and energy, and feel betteroverall. Choosing what is best to eat and drink during and after treatment can be a challenge. If you need help, ask to be referred to a registered dietitian. Adietitian can help you create a balanced eating plan. How to maintain healthy nutrition during cancer treatment What are tips for eating during treatment? During treatment, your tastes may change, so focus on foods that taste good to you. Eat your largest meal when you feel the most hungry. Limit drinking during the meal to increase food intake. Drink between meals to prevent dehydration. If you have lost your appetite or cannot keep food down, your cancer care team may suggest that you consume foods or drinks that have had nutrients added to them (are fortified) or are higher in calories, fat, and protein to help prevent weight loss. If you are still not able to meet your nutrition needs, you may benefit from other forms of nutrition support. Your care team may suggest appetite stimulants, tube feeding, or nutrition through an IV inserted into one of your veins. What foods should I eat during treatment?  Appetite is often altered during treatment. Some will have low appetite due to side effects of treatment. Others may gain weight due to medicines, less activity, and stress. Talk with a dietitian to help create the best eating plan for your situation. For a few meals each week, try eating plant-based foods that are  high in protein, such as beans and peas, instead of meat. Include lots of whole grains, fruits, and vegetables. Eat at least 2 cups of fruits and vegetables a day. Include fresh fruits and dark-green and deep-yellow vegetables, which are full of natural, healthy substances. Remember to wash fresh produce thoroughly before eating to reduce any risk for infection. Throughout the day, eat small snacks that are high in calories and protein. High-protein snacks that are easy to prepare and eat include: Yogurt. Cereal and milk. Half of a sandwich. A bowl of hearty soup. Cheese and crackers. Avoid snacks that could make side effects from treatment worse. For instance, popcorn, raw fruits, and raw vegetables can  make diarrhea worse. Dry, coarse snacks or foods high in acid can hurt your throat if it is already sore. Drink enough fluid to keep your urine pale yellow. Talk with your health care provider about taking any vitamins and supplements, as high doses of some can alter the effectiveness of treatment therapy.  How to maintain healthy nutrition after cancer treatment The goals of your nutrition after cancer treatment are to achieve and maintaina healthy weight and to be physically active on a regular basis. What are tips for eating after treatment? After cancer treatment, eat plenty of proteins, carbohydrates, and fats. You can get each of these dietary elements from a wide range of foods. You may also want to eat a diet rich in plant-based foods because those have been shown tobe beneficial for cancer survivors. What foods should I eat after treatment? Here are some types of foods that you should include in your diet after treatment: Proteins. Protein is needed for growth, repairing body tissue and muscles, fighting infection, and helping to keep your body's defense system (immune system) healthy. When planning meals, choose lean proteins that do not have a lot of saturated fats. Include fish, lean  meat, skinless poultry, eggs, nonfat and low-fat dairy products, nuts, seeds, and legumes (like beans, peas, and lentils) in your daily diet. Carbohydrates. Carbohydrates are your body's main source of energy. They provide the fuel you need for physical activity and for your organs to work well. Most of the carbohydrates in your diet should come from foods that are high in essential nutrients and fiber, such as vegetables, fruits, whole grains, and legumes. Fruits and vegetables. Eat at least 2-3 cups of vegetables and 1-2 cups of fruits each day. You may choose to eat them fresh, frozen, canned, raw, cooked, or dried. If you want to heat the fruits or vegetables, consider microwaving or steaming them rather than boiling them in water. Microwaving and steaming keep their nutrients better. Dairy. Choose low-fat milk and other low-fat dairy products. Fats and oils. Fats help boost energy in your body. Choose healthy fats from nuts, seeds, avocados, fatty fish, and vegetable oils, including olive, avocado, canola, peanut, safflower, grapeseed, and sesame. Beverages. All your body's cells need water to work well, so drink plenty of water. Drink about eight 8-oz glasses of liquid each day so your body's cells get the fluid they need. Keep in mind that foods such as soups, milk, and even ice cream count toward that goal. The items listed above may not be a complete list of foods and beverages you can eat. Contact a dietitian for more information. What foods and beverages should I avoid or limit? Maintaining a healthy weight is important for life after treatment. Check ingredients and nutrition facts on packaged foods and beverages. Talk with a registered dietitian to create a specific eating plan for you. Follow these recommendations for general healthy eating: Avoid beverages that are high in sugar, including juices. Avoiding these will help you stay at a healthy weight and help with blood sugar  control. Limit high-fat foods, including fried foods and foods that come from animals. An example of a high-fat food from an animal is red meat, processed meats, or full-fat dairy. If you have diarrhea from treatment, fatty foods can make it worse. Limit foods that are high in added sugar, such as cookies, baked goods, and candy. Limit salt-cured, smoked, and pickled foods. Avoid alcohol. There is a link between alcohol and the risk of certain cancers. If  you choose to drink alcohol, it is best to limit intake to 1 drink a day for nonpregnant women and 2 drinks a day for men. In the [place] one drink equals one 12 oz bottle of beer (355 mL), one 5 oz glass of wine (148 mL), or one 1 oz glass of hard liquor (44 mL). Depending on your cancer diagnosis, your health care provider may recommend that you do not drink alcohol at all. Limit foods that are high in saturated fats, such as cheese and butter. Saturated fats may raise your risk of cancer. Limit processed foods, such as packaged meals and snacks. The items listed above may not be a complete list of foods and beverages you should avoid. Contact a dietitian for more information. Summary Good nutrition plays a big part in healing and keeping your body strong during and after cancer treatment. Ask to meet with a registered dietitian. A dietitian can help you create an eating plan that works well for you. During treatment, it may be helpful to eat small meals and snacks that are high in calories and protein. After cancer treatment, eat plenty of proteins, carbohydrates, and fats. You may also want to eat a diet rich in plant-based foods because those have been shown to be beneficial for cancer survivors. This information is not intended to replace advice given to you by your health care provider. Make sure you discuss any questions you have with your healthcare provider. Document Revised: 10/07/2019 Document Reviewed: 10/07/2019 Elsevier Patient  Education  [place].

## 2021-07-11 NOTE — Progress Notes (Signed)
Patient Care Team: Patient, [name] (Inactive) as PCP - General (General Practice) [name], RN as Oncology Nurse Navigator [name], RN as Oncology Nurse Navigator  DIAGNOSIS:    ICD-10-CM   1. Malignant neoplasm of upper-inner quadrant of right breast in female, estrogen receptor positive ([place])  C50.211    Z17.0       SUMMARY OF ONCOLOGIC HISTORY: Oncology History  Malignant neoplasm of upper-inner quadrant of right breast in female, estrogen receptor positive ([place])  04/08/2021 Initial Diagnosis   Palpable right breast mass: Mammogram and US showed a 4.5cm right breast mass at the 12-1 o'clock position with associated microcalcifications, multiple additional masses from the 9-11 o'clock position in the right breast, calcifications in the subareolar right breast, 1.4cm, and two enlarged right axillary lymph nodes. 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 Two variants of uncertain significance (VUS) were detected - one in the APC gene called p.T2422I (c.7265C>T) and a second in the BAP1 gene called p.P302L (c.905C>T). The report dates are 04/19/2021 and 04/25/2021, respectively.  The BRCAplus panel offered by [employer] and includes sequencing and deletion/duplication analysis for the following 8 genes: ATM, BRCA1, BRCA2, CDH1, CHEK2, PALB2, PTEN, and TP53. The CancerNext-Expanded + RNAinsight gene panel offered by [employer] and includes sequencing and  rearrangement analysis for the following 77 genes: AIP, ALK, APC, ATM, AXIN2, BAP1, BARD1, BLM, BMPR1A, BRCA1, BRCA2, BRIP1, CDC73, CDH1, CDK4, CDKN1B, CDKN2A, CHEK2, CTNNA1, DICER1, FANCC, FH, FLCN, GALNT12, KIF1B, LZTR1, MAX, MEN1, MET, MLH1, MSH2, MSH3, MSH6, MUTYH, NBN, NF1, NF2, NTHL1, PALB2, PHOX2B, PMS2, POT1, PRKAR1A, PTCH1, PTEN, RAD51C, RAD51D, RB1, RECQL, RET, SDHA, SDHAF2, SDHB, SDHC, SDHD, SMAD4, SMARCA4, SMARCB1, SMARCE1, STK11, SUFU, TMEM127, TP53, TSC1, TSC2, VHL and XRCC2 (sequencing and deletion/duplication); EGFR, EGLN1, HOXB13, KIT, MITF, PDGFRA, POLD1 and POLE (sequencing only); EPCAM and GREM1 (deletion/duplication only). RNA data is routinely analyzed for use in variant interpretation for all genes.    05/10/2021 -  Chemotherapy    Patient is on Treatment Plan: BREAST ADJUVANT DOSE DENSE AC Q14D / PACLITAXEL Q7D         CHIEF COMPLIANT: Cycle 2 Taxol  INTERVAL HISTORY: Chelsea Wise is a 40 y.o. with above-mentioned history of right breast cancer having completed neoadjuvant chemotherapy with dose dense Adriamycin and Cytoxan, currently on chemotherapy with Taxol. She reports to the clinic today for Cycle 2. she tolerated cycle 1 chemo extremely well.  Did not have any nausea or vomiting.  She did report that her cycles have returned.  ALLERGIES:  has No Known Allergies.  MEDICATIONS:  Current Outpatient Medications  Medication Sig Dispense Refill   acetaminophen (TYLENOL) 500 MG tablet Take 500-1,000 mg by mouth every 6 (six) hours as needed for moderate pain.     Ascorbic Acid (VITAMIN C PO) Take 1 tablet by mouth daily.     cetirizine (ZYRTEC) 10 MG tablet Take 10 mg by mouth daily as needed for allergies.  ELDERBERRY PO Take 1 capsule by mouth daily.     hydrocortisone cream 1 % Apply 1 application topically 2 (two) times daily as needed for itching.     lidocaine-prilocaine (EMLA) cream Apply to affected area once 30 g 3   Multiple Vitamin (MULTIVITAMIN  WITH MINERALS) TABS tablet Take 2 tablets by mouth daily.     Multiple Vitamins-Minerals (ZINC PO) Take 1 tablet by mouth once a week.     nystatin (MYCOSTATIN) 100000 UNIT/ML suspension TAKE 5 MLS (500,000 UNITS TOTAL) BY MOUTH IN THE MORNING AND AT BEDTIME. 60 mL 0   ondansetron (ZOFRAN) 8 MG tablet Take 1 tablet (8 mg total) by mouth 2 (two) times daily as needed. Start on the third day after chemotherapy. 30 tablet 1   prochlorperazine (COMPAZINE) 10 MG tablet Take 1 tablet (10 mg total) by mouth every 6 (six) hours as needed (Nausea or vomiting). 30 tablet 1   sodium chloride (OCEAN) 0.65 % SOLN nasal spray Place 1 spray into both nostrils as needed for congestion.     No current facility-administered medications for this visit.    PHYSICAL EXAMINATION: ECOG PERFORMANCE STATUS: 1 - Symptomatic but completely ambulatory  Vitals:   07/12/21 1020  BP: 135/87  Pulse: 84  Resp: 18  Temp: 97.8 F (36.6 C)  SpO2: 100%   Filed Weights   07/12/21 1020  Weight: 133 lb (60.3 kg)     LABORATORY DATA:  I have reviewed the data as listed CMP Latest Ref Rng & Units 07/05/2021 06/21/2021 06/07/2021  Glucose 70 - 99 mg/dL 86 84 79  BUN 6 - 20 mg/dL $Remove'9 14 10  'jyVrUIP$ Creatinine 0.44 - 1.00 mg/dL 0.69 0.66 0.66  Sodium 135 - 145 mmol/L 138 140 138  Potassium 3.5 - 5.1 mmol/L 4.0 3.9 3.9  Chloride 98 - 111 mmol/L 106 106 105  CO2 22 - 32 mmol/L $RemoveB'26 25 25  'upAatWQn$ Calcium 8.9 - 10.3 mg/dL 9.2 9.4 9.1  Total Protein 6.5 - 8.1 g/dL 6.7 7.1 6.9  Total Bilirubin 0.3 - 1.2 mg/dL <0.2(L) <0.2(L) <0.2(L)  Alkaline Phos 38 - 126 U/L 81 83 80  AST 15 - 41 U/L 14(L) 15 15  ALT 0 - 44 U/L $Remo'9 10 11    'rkptT$ Lab Results  Component Value Date   WBC 10.1 07/05/2021   HGB 10.6 (L) 07/05/2021   HCT 31.0 (L) 07/05/2021   MCV 90.1 07/05/2021   PLT 196 07/05/2021   NEUTROABS 6.8 07/05/2021    ASSESSMENT & PLAN:  Malignant neoplasm of upper-inner quadrant of right breast in female, estrogen receptor positive  ([place]) 04/08/2021:Palpable right breast mass: Mammogram and US showed a 4.5cm right breast mass at the 12-1 o'clock position with associated microcalcifications, multiple additional masses from the 9-11 o'clock position in the right breast, calcifications in the subareolar right breast, 1.4cm, and two enlarged right axillary lymph nodes. 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  Neoadjuvant chemotherapy with dose dense Adriamycin and Cytoxan x4 followed by Taxol weekly x12 2. mastectomy with targeted node dissection 3.  Adjuvant radiation 4.  Followed by adjuvant antiestrogen therapy -------------------------------------------------------------------------------------------------------------------- Current treatment: completed 4 cycles of dose dense Adriamycin Cytoxan, today is cycle 2 Taxol  Chemo toxicities:  Mild fatigue Tongue discoloration Occasional mouth sores Chemo induced anemia: Hemoglobin today is 10: Monitoring Menstrual cycles have returned: We discussed that it is very important to avoid getting pregnant during chemo.    Denies any nausea or vomiting. Blood counts have been reviewed.   Return to clinic in weekly for chemo    No orders of the defined types were placed in this encounter.  The patient has a good understanding of the overall plan. she agrees with it. she will call with any problems that may develop before the next visit here.  Total time spent: 30 mins including face to face time and time spent for planning, charting and coordination of care  Rulon Eisenmenger, MD, MPH 07/12/2021  I, Thana Ates, am acting as scribe for Dr. Nicholas Lose.  I have reviewed the above documentation for accuracy  and completeness, and I agree with the above.

## 2021-07-11 NOTE — Assessment & Plan Note (Signed)
04/08/2021:Palpable right breast mass: Mammogram and US showed a 4.5cm right breast mass at the 12-1 o'clock position with associated microcalcifications, multiple additional masses from the 9-11 o'clock position in the right breast, calcifications in the subareolar right breast, 1.4cm, and two enlarged right axillary lymph nodes. 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 Neoadjuvant chemotherapy with dose dense Adriamycin and Cytoxan x4 followed by Taxol weekly x12 2. mastectomy with targeted node dissection 3. Adjuvant radiation 4. Followed by adjuvant antiestrogen therapy -------------------------------------------------------------------------------------------------------------------- Current treatment: completed 4 cycles ofdose dense Adriamycin Cytoxan, today is cycle 2 Taxol  Chemo toxicities: Mild fatigue Tongue discoloration Occasional mouth sores Chemo induced anemia: Hemoglobin today is 10.6: Monitoring  Denies any nausea or vomiting. Blood counts have been reviewed.  Return to clinic inweekly for chemo

## 2021-07-12 ENCOUNTER — Inpatient Hospital Stay: Payer: Federal, State, Local not specified - PPO

## 2021-07-12 ENCOUNTER — Other Ambulatory Visit: Payer: Self-pay

## 2021-07-12 ENCOUNTER — Encounter: Payer: Self-pay | Admitting: *Deleted

## 2021-07-12 ENCOUNTER — Inpatient Hospital Stay: Payer: Federal, State, Local not specified - PPO | Admitting: Hematology and Oncology

## 2021-07-12 DIAGNOSIS — Z170 Estrogen receptor positive status [ER+]: Secondary | ICD-10-CM

## 2021-07-12 DIAGNOSIS — Z95828 Presence of other vascular implants and grafts: Secondary | ICD-10-CM

## 2021-07-12 DIAGNOSIS — C50211 Malignant neoplasm of upper-inner quadrant of right female breast: Secondary | ICD-10-CM

## 2021-07-12 LAB — CBC WITH DIFFERENTIAL (CANCER CENTER ONLY)
Abs Immature Granulocytes: 0.07 10*3/uL (ref 0.00–0.07)
Basophils Absolute: 0 10*3/uL (ref 0.0–0.1)
Basophils Relative: 1 %
Eosinophils Absolute: 0 10*3/uL (ref 0.0–0.5)
Eosinophils Relative: 1 %
HCT: 28.9 % — ABNORMAL LOW (ref 36.0–46.0)
Hemoglobin: 10 g/dL — ABNORMAL LOW (ref 12.0–15.0)
Immature Granulocytes: 1 %
Lymphocytes Relative: 14 %
Lymphs Abs: 1 10*3/uL (ref 0.7–4.0)
MCH: 31.3 pg (ref 26.0–34.0)
MCHC: 34.6 g/dL (ref 30.0–36.0)
MCV: 90.6 fL (ref 80.0–100.0)
Monocytes Absolute: 0.5 10*3/uL (ref 0.1–1.0)
Monocytes Relative: 7 %
Neutro Abs: 5.3 10*3/uL (ref 1.7–7.7)
Neutrophils Relative %: 76 %
Platelet Count: 291 10*3/uL (ref 150–400)
RBC: 3.19 MIL/uL — ABNORMAL LOW (ref 3.87–5.11)
RDW: 16.2 % — ABNORMAL HIGH (ref 11.5–15.5)
WBC Count: 6.9 10*3/uL (ref 4.0–10.5)
nRBC: 0 % (ref 0.0–0.2)

## 2021-07-12 LAB — CMP (CANCER CENTER ONLY)
ALT: 22 U/L (ref 0–44)
AST: 18 U/L (ref 15–41)
Albumin: 4.1 g/dL (ref 3.5–5.0)
Alkaline Phosphatase: 51 U/L (ref 38–126)
Anion gap: 8 (ref 5–15)
BUN: 8 mg/dL (ref 6–20)
CO2: 24 mmol/L (ref 22–32)
Calcium: 9.4 mg/dL (ref 8.9–10.3)
Chloride: 107 mmol/L (ref 98–111)
Creatinine: 0.67 mg/dL (ref 0.44–1.00)
GFR, Estimated: >60 mL/min (ref >60)
Glucose, Bld: 98 mg/dL (ref 70–99)
Potassium: 3.9 mmol/L (ref 3.5–5.1)
Sodium: 139 mmol/L (ref 135–145)
Total Bilirubin: <0.2 mg/dL — ABNORMAL LOW (ref 0.3–1.2)
Total Protein: 7.2 g/dL (ref 6.5–8.1)

## 2021-07-12 LAB — PREGNANCY, URINE: Preg Test, Ur: NEGATIVE

## 2021-07-12 MED ORDER — SODIUM CHLORIDE 0.9% FLUSH
10.0000 mL | Freq: Once | INTRAVENOUS | Status: AC
Start: 2021-07-12 — End: 2021-07-12
  Administered 2021-07-12: 10 mL
  Filled 2021-07-12: qty 10

## 2021-07-12 MED ORDER — SODIUM CHLORIDE 0.9 % IV SOLN
Freq: Once | INTRAVENOUS | Status: AC
Start: 2021-07-12 — End: 2021-07-12
  Filled 2021-07-12: qty 250

## 2021-07-12 MED ORDER — ONDANSETRON HCL 4 MG/2ML IJ SOLN
8.0000 mg | Freq: Once | INTRAMUSCULAR | Status: AC
  Administered 2021-07-12: 8 mg via INTRAVENOUS

## 2021-07-12 MED ORDER — FAMOTIDINE 20 MG IN NS 100 ML IVPB
20.0000 mg | Freq: Once | INTRAVENOUS | Status: AC
  Administered 2021-07-12: 20 mg via INTRAVENOUS

## 2021-07-12 MED ORDER — SODIUM CHLORIDE 0.9 % IV SOLN
10.0000 mg | Freq: Once | INTRAVENOUS | Status: AC
  Administered 2021-07-12: 10 mg via INTRAVENOUS
  Filled 2021-07-12: qty 10

## 2021-07-12 MED ORDER — DIPHENHYDRAMINE HCL 50 MG/ML IJ SOLN
25.0000 mg | Freq: Once | INTRAMUSCULAR | Status: AC
Start: 2021-07-12 — End: 2021-07-12
  Administered 2021-07-12: 25 mg via INTRAVENOUS

## 2021-07-12 MED ORDER — SODIUM CHLORIDE 0.9 % IV SOLN
80.0000 mg/m2 | Freq: Once | INTRAVENOUS | Status: AC
  Administered 2021-07-12: 132 mg via INTRAVENOUS
  Filled 2021-07-12: qty 22

## 2021-07-12 MED ORDER — ONDANSETRON HCL 4 MG/2ML IJ SOLN
INTRAMUSCULAR | Status: AC
  Filled 2021-07-12: qty 4

## 2021-07-12 MED ORDER — DIPHENHYDRAMINE HCL 50 MG/ML IJ SOLN
INTRAMUSCULAR | Status: AC
  Filled 2021-07-12: qty 1

## 2021-07-12 MED ORDER — SODIUM CHLORIDE 0.9% FLUSH
10.0000 mL | INTRAVENOUS | Status: DC | PRN
Start: 2021-07-12 — End: 2021-07-12
  Administered 2021-07-12: 10 mL
  Filled 2021-07-12: qty 10

## 2021-07-12 MED ORDER — HEPARIN SOD (PORK) LOCK FLUSH 100 UNIT/ML IV SOLN
500.0000 [IU] | Freq: Once | INTRAVENOUS | Status: AC | PRN
  Administered 2021-07-12: 500 [IU]
  Filled 2021-07-12: qty 5

## 2021-07-12 MED ORDER — FAMOTIDINE 20 MG IN NS 100 ML IVPB
INTRAVENOUS | Status: AC
  Filled 2021-07-12: qty 100

## 2021-07-12 NOTE — Patient Instructions (Signed)
[facility]  Discharge Instructions: Thank you for choosing [facility] to provide your oncology and hematology care.   If you have a lab appointment with the [facility], please go directly to the [facility] and check in at the registration area.   Wear comfortable clothing and clothing appropriate for easy access to any Portacath or PICC line.   We strive to give you quality time with your provider. You may need to reschedule your appointment if you arrive late (15 or more minutes).  Arriving late affects you and other patients whose appointments are after yours.  Also, if you miss three or more appointments without notifying the office, you may be dismissed from the clinic at the provider's discretion.      For prescription refill requests, have your pharmacy contact our office and allow 72 hours for refills to be completed.    Today you received the following chemotherapy and/or immunotherapy agents Paclitaxel (Taxol).      To help prevent nausea and vomiting after your treatment, we encourage you to take your nausea medication as directed.  BELOW ARE SYMPTOMS THAT SHOULD BE REPORTED IMMEDIATELY: *FEVER GREATER THAN 100.4 F (38 C) OR HIGHER *CHILLS OR SWEATING *NAUSEA AND VOMITING THAT IS NOT CONTROLLED WITH YOUR NAUSEA MEDICATION *UNUSUAL SHORTNESS OF BREATH *UNUSUAL BRUISING OR BLEEDING *URINARY PROBLEMS (pain or burning when urinating, or frequent urination) *BOWEL PROBLEMS (unusual diarrhea, constipation, pain near the anus) TENDERNESS IN MOUTH AND THROAT WITH OR WITHOUT PRESENCE OF ULCERS (sore throat, sores in mouth, or a toothache) UNUSUAL RASH, SWELLING OR PAIN  UNUSUAL VAGINAL DISCHARGE OR ITCHING   Items with * indicate a potential emergency and should be followed up as soon as possible or go to the Emergency Department if any problems should occur.  Please show the CHEMOTHERAPY ALERT CARD or IMMUNOTHERAPY ALERT CARD at  check-in to the Emergency Department and triage nurse.  Should you have questions after your visit or need to cancel or reschedule your appointment, please contact [facility]  Dept: [phone]  and follow the prompts.  Office hours are 8:00 a.m. to 4:30 p.m. Monday - Friday. Please note that voicemails left after 4:00 p.m. may not be returned until the following business day.  We are closed weekends and major holidays. You have access to a nurse at all times for urgent questions. Please call the main number to the clinic Dept: [phone] and follow the prompts.   For any non-urgent questions, you may also contact your provider using MyChart. We now offer e-Visits for anyone 15 and older to request care online for non-urgent symptoms. For details visit [email or web address].   Also download the MyChart app! Go to the app store, search "MyChart", open the app, select [facility], and log in with your MyChart username and password.  Due to Covid, a mask is required upon entering the hospital/clinic. If you do not have a mask, one will be given to you upon arrival. For doctor visits, patients may have 1 support person aged 55 or older with them. For treatment visits, patients cannot have anyone with them due to current Covid guidelines and our immunocompromised population.   Paclitaxel injection What is this medication? PACLITAXEL (PAK li TAX el) is a chemotherapy drug. It targets fast dividing cells, like cancer cells, and causes these cells to die. This medicine is used to treat ovarian cancer, breast cancer, lung cancer, Kaposi's sarcoma, andother cancers. This medicine may  be used for other purposes; ask your health care provider orpharmacist if you have questions. COMMON BRAND NAME(S): Onxol, Taxol What should I tell my care team before I take this medication? They need to know if you have any of these conditions: history of irregular heartbeat liver  disease low blood counts, like low white cell, platelet, or red cell counts lung or breathing disease, like asthma tingling of the fingers or toes, or other nerve disorder an unusual or allergic reaction to paclitaxel, alcohol, polyoxyethylated castor oil, other chemotherapy, other medicines, foods, dyes, or preservatives pregnant or trying to get pregnant breast-feeding How should I use this medication? This drug is given as an infusion into a vein. It is administered in a hospitalor clinic by a specially trained health care professional. Talk to your pediatrician regarding the use of this medicine in children.Special care may be needed. Overdosage: If you think you have taken too much of this medicine contact apoison control center or emergency room at once. NOTE: This medicine is only for you. Do not share this medicine with others. What if I miss a dose? It is important not to miss your dose. Call your doctor or health careprofessional if you are unable to keep an appointment. What may interact with this medication? Do not take this medicine with any of the following medications: live virus vaccines This medicine may also interact with the following medications: antiviral medicines for hepatitis, HIV or AIDS certain antibiotics like erythromycin and clarithromycin certain medicines for fungal infections like ketoconazole and itraconazole certain medicines for seizures like carbamazepine, phenobarbital, phenytoin gemfibrozil nefazodone rifampin St. John's wort This list may not describe all possible interactions. Give your health care provider a list of all the medicines, herbs, non-prescription drugs, or dietary supplements you use. Also tell them if you smoke, drink alcohol, or use illegaldrugs. Some items may interact with your medicine. What should I watch for while using this medication? Your condition will be monitored carefully while you are receiving this medicine. You will  need important blood work done while you are taking thismedicine. This medicine can cause serious allergic reactions. To reduce your risk you will need to take other medicine(s) before treatment with this medicine. If you experience allergic reactions like skin rash, itching or hives, swelling of theface, lips, or tongue, tell your doctor or health care professional right away. In some cases, you may be given additional medicines to help with side effects.Follow all directions for their use. This drug may make you feel generally unwell. This is not uncommon, as chemotherapy can affect healthy cells as well as cancer cells. Report any side effects. Continue your course of treatment even though you feel ill unless yourdoctor tells you to stop. Call your doctor or health care professional for advice if you get a fever, chills or sore throat, or other symptoms of a cold or flu. Do not treat yourself. This drug decreases your body's ability to fight infections. Try toavoid being around people who are sick. This medicine may increase your risk to bruise or bleed. Call your doctor orhealth care professional if you notice any unusual bleeding. Be careful brushing and flossing your teeth or using a toothpick because you may get an infection or bleed more easily. If you have any dental work done,tell your dentist you are receiving this medicine. Avoid taking products that contain aspirin, acetaminophen, ibuprofen, naproxen, or ketoprofen unless instructed by your doctor. These medicines may hide afever. Do not become pregnant while taking this  medicine. Women should inform their doctor if they wish to become pregnant or think they might be pregnant. There is a potential for serious side effects to an unborn child. Talk to your health care professional or pharmacist for more information. Do not breast-feed aninfant while taking this medicine. Men are advised not to father a child while receiving this medicine. This  product may contain alcohol. Ask your pharmacist or healthcare provider if this medicine contains alcohol. Be sure to tell all healthcare providers you are taking this medicine. Certain medicines, like metronidazole and disulfiram, can cause an unpleasant reaction when taken with alcohol. The reaction includes flushing, headache, nausea, vomiting, sweating, and increased thirst. Thereaction can last from 30 minutes to several hours. What side effects may I notice from receiving this medication? Side effects that you should report to your doctor or health care professionalas soon as possible: allergic reactions like skin rash, itching or hives, swelling of the face, lips, or tongue breathing problems changes in vision fast, irregular heartbeat high or low blood pressure mouth sores pain, tingling, numbness in the hands or feet signs of decreased platelets or bleeding - bruising, pinpoint red spots on the skin, black, tarry stools, blood in the urine signs of decreased red blood cells - unusually weak or tired, feeling faint or lightheaded, falls signs of infection - fever or chills, cough, sore throat, pain or difficulty passing urine signs and symptoms of liver injury like dark yellow or brown urine; general ill feeling or flu-like symptoms; light-colored stools; loss of appetite; nausea; right upper belly pain; unusually weak or tired; yellowing of the eyes or skin swelling of the ankles, feet, hands unusually slow heartbeat Side effects that usually do not require medical attention (report to yourdoctor or health care professional if they continue or are bothersome): diarrhea hair loss loss of appetite muscle or joint pain nausea, vomiting pain, redness, or irritation at site where injected tiredness This list may not describe all possible side effects. Call your doctor for medical advice about side effects. You may report side effects to FDA at1-800-FDA-1088. Where should I keep my  medication? This drug is given in a hospital or clinic and will not be stored at home. NOTE: This sheet is a summary. It may not cover all possible information. If you have questions about this medicine, talk to your doctor, pharmacist, orhealth care provider.  2022 Elsevier/Gold Standard (2019-10-22 13:37:23)  Fatigue If you have fatigue, you feel tired all the time and have a lack of energy or a lack of motivation. Fatigue may make it difficult to start or complete tasks because of exhaustion. In general, occasional or mild fatigue is often a normal response to activity or life. However, long-lasting (chronic) or extreme fatigue may be a symptom of a medical condition. Follow these instructions at home: General instructions Watch your fatigue for any changes. Go to bed and get up at the same time every day. Avoid fatigue by pacing yourself during the day and getting enough sleep at night. Maintain a healthy weight. Medicines Take over-the-counter and prescription medicines only as told by your health care provider. Take a multivitamin, if told by your health care provider.  Do not use herbal or dietary supplements unless they are approved by your health care provider. Activity  Exercise regularly, as told by your health care provider. Use or practice techniques to help you relax, such as yoga, tai chi, meditation, or massage therapy.  Eating and drinking  Avoid heavy meals in  the evening. Eat a well-balanced diet, which includes lean proteins, whole grains, plenty of fruits and vegetables, and low-fat dairy products. Avoid consuming too much caffeine. Avoid the use of alcohol. Drink enough fluid to keep your urine pale yellow.  Lifestyle Change situations that cause you stress. Try to keep your work and personal schedule in balance. Do not use any products that contain nicotine or tobacco, such as cigarettes and e-cigarettes. If you need help quitting, ask your health care  provider. Do not use drugs. Contact a health care provider if: Your fatigue does not get better. You have a fever. You suddenly lose or gain weight. You have headaches. You have trouble falling asleep or sleeping through the night. You feel angry, guilty, anxious, or sad. You are unable to have a bowel movement (constipation). Your skin is dry. You have swelling in your legs or another part of your body. Get help right away if: You feel confused. Your vision is blurry. You feel faint or you pass out. You have a severe headache. You have severe pain in your abdomen, your back, or the area between your waist and hips (pelvis). You have chest pain, shortness of breath, or an irregular or fast heartbeat. You are unable to urinate, or you urinate less than normal. You have abnormal bleeding, such as bleeding from the rectum, vagina, nose, lungs, or nipples. You vomit blood. You have thoughts about hurting yourself or others. If you ever feel like you may hurt yourself or others, or have thoughts about taking your own life, get help right away. You can go to your nearest emergency department or call: Your local emergency services (911 in the [place] A suicide crisis helpline, such as the [facility] at 236-149-5178. This is open 24 hours a day. Summary If you have fatigue, you feel tired all the time and have a lack of energy or a lack of motivation. Fatigue may make it difficult to start or complete tasks because of exhaustion. Long-lasting (chronic) or extreme fatigue may be a symptom of a medical condition. Exercise regularly, as told by your health care provider. Change situations that cause you stress. Try to keep your work and personal schedule in balance. This information is not intended to replace advice given to you by your health care provider. Make sure you discuss any questions you have with your healthcare provider. Document Revised: 09/30/2020  Document Reviewed: 09/30/2020 Elsevier Patient Education  2022 [place].  Rehydration, Adult Rehydration is the replacement of body fluids, salts, and minerals (electrolytes) that are lost during dehydration. Dehydration is when there is not enough water or other fluids in the body. This happens when you lose more fluids than you take in. Common causes of dehydration include: Not drinking enough fluids. This can occur when you are ill or doing activities that require a lot of energy, especially in hot weather. Conditions that cause loss of water or other fluids, such as diarrhea, vomiting, sweating, or urinating a lot. Other illnesses, such as fever or infection. Certain medicines, such as those that remove excess fluid from the body (diuretics). Symptoms of mild or moderate dehydration may include thirst, dry lips and mouth, and dizziness. Symptoms of severe dehydration may include increasedheart rate, confusion, fainting, and not urinating. For severe dehydration, you may need to get fluids through an IV at the hospital. For mild or moderate dehydration, you can usually rehydrate at homeby drinking certain fluids as told by your health care provider. What are  the risks? Generally, rehydration is safe. However, taking in too much fluid (overhydration) can be a problem. This is rare. Overhydration can cause an electrolyte imbalance, kidney failure, or a decrease in salt (sodium) levels in the body. Supplies needed You will need an oral rehydration solution (ORS) if your health care provider tells you to use one. This is a drink to treat dehydration. It can be found inpharmacies and retail stores. How to rehydrate Fluids Follow instructions from your health care provider for rehydration. The kind of fluid and the amount you should drink depend on your condition. In general, you should choose drinks that you prefer. If told by your health care provider, drink an ORS. Make an ORS by following  instructions on the package. Start by drinking small amounts, about  cup (120 mL) every 5-10 minutes. Slowly increase how much you drink until you have taken the amount recommended by your health care provider. Drink enough clear fluids to keep your urine pale yellow. If you were told to drink an ORS, finish it first, then start slowly drinking other clear fluids. Drink fluids such as: Water. This includes sparkling water and flavored water. Drinking only water can lead to having too little sodium in your body (hyponatremia). Follow the advice of your health care provider. Water from ice chips you suck on. Fruit juice with water you add to it (diluted). Sports drinks. Hot or cold herbal teas. Broth-based soups. Milk or milk products. Food Follow instructions from your health care provider about what to eat while you rehydrate. Your health care provider may recommend that you slowly begin eating regular foods in small amounts. Eat foods that contain a healthy balance of electrolytes, such as bananas, oranges, potatoes, tomatoes, and spinach. Avoid foods that are greasy or contain a lot of sugar. In some cases, you may get nutrition through a feeding tube that is passed through your nose and into your stomach (nasogastric tube, or NG tube). This may be done if you have uncontrolled vomiting or diarrhea. Beverages to avoid  Certain beverages may make dehydration worse. While you rehydrate, avoiddrinking alcohol. How to tell if you are recovering from dehydration You may be recovering from dehydration if: You are urinating more often than before you started rehydrating. Your urine is pale yellow. Your energy level improves. You vomit less frequently. You have diarrhea less frequently. Your appetite improves or returns to normal. You feel less dizzy or less light-headed. Your skin tone and color start to look more normal. Follow these instructions at home: Take over-the-counter and  prescription medicines only as told by your health care provider. Do not take sodium tablets. Doing this can lead to having too much sodium in your body (hypernatremia). Contact a health care provider if: You continue to have symptoms of mild or moderate dehydration, such as: Thirst. Dry lips. Slightly dry mouth. Dizziness. Dark urine or less urine than normal. Muscle cramps. You continue to vomit or have diarrhea. Get help right away if you: Have symptoms of dehydration that get worse. Have a fever. Have a severe headache. Have been vomiting and the following happens: Your vomiting gets worse or does not go away. Your vomit includes blood or green matter (bile). You cannot eat or drink without vomiting. Have problems with urination or bowel movements, such as: Diarrhea that gets worse or does not go away. Blood in your stool (feces). This may cause stool to look black and tarry. Not urinating, or urinating only a small amount of  very dark urine, within 6-8 hours. Have trouble breathing. Have symptoms that get worse with treatment. These symptoms may represent a serious problem that is an emergency. Do not wait to see if the symptoms will go away. Get medical help right away. Call your local emergency services (911 in the [place] Do not drive yourself to the hospital. Summary Rehydration is the replacement of body fluids and minerals (electrolytes) that are lost during dehydration. Follow instructions from your health care provider for rehydration. The kind of fluid and amount you should drink depend on your condition. Slowly increase how much you drink until you have taken the amount recommended by your health care provider. Contact your health care provider if you continue to show signs of mild or moderate dehydration. This information is not intended to replace advice given to you by your health care provider. Make sure you discuss any questions you have with your healthcare  provider. Document Revised: 01/21/2020 Document Reviewed: 12/01/2019 Elsevier Patient Education  2022 [place] and Nutrition What you eat both during and after your cancer treatment plays a big part in your recovery. During treatment, eating well helps you stay strong, provides you with energy for tissue healing, and helps you fight infection. It may also help with side effects. When treatment ends and you start feeling better, eating well helps you rebuild tissue, gain strength and energy, and feel betteroverall. Choosing what is best to eat and drink during and after treatment can be a challenge. If you need help, ask to be referred to a registered dietitian. Adietitian can help you create a balanced eating plan. How to maintain healthy nutrition during cancer treatment What are tips for eating during treatment? During treatment, your tastes may change, so focus on foods that taste good to you. Eat your largest meal when you feel the most hungry. Limit drinking during the meal to increase food intake. Drink between meals to prevent dehydration. If you have lost your appetite or cannot keep food down, your cancer care team may suggest that you consume foods or drinks that have had nutrients added to them (are fortified) or are higher in calories, fat, and protein to help prevent weight loss. If you are still not able to meet your nutrition needs, you may benefit from other forms of nutrition support. Your care team may suggest appetite stimulants, tube feeding, or nutrition through an IV inserted into one of your veins. What foods should I eat during treatment?  Appetite is often altered during treatment. Some will have low appetite due to side effects of treatment. Others may gain weight due to medicines, less activity, and stress. Talk with a dietitian to help create the best eating plan for your situation. For a few meals each week, try eating plant-based foods that are  high in protein, such as beans and peas, instead of meat. Include lots of whole grains, fruits, and vegetables. Eat at least 2 cups of fruits and vegetables a day. Include fresh fruits and dark-green and deep-yellow vegetables, which are full of natural, healthy substances. Remember to wash fresh produce thoroughly before eating to reduce any risk for infection. Throughout the day, eat small snacks that are high in calories and protein. High-protein snacks that are easy to prepare and eat include: Yogurt. Cereal and milk. Half of a sandwich. A bowl of hearty soup. Cheese and crackers. Avoid snacks that could make side effects from treatment worse. For instance, popcorn, raw fruits, and raw vegetables can  make diarrhea worse. Dry, coarse snacks or foods high in acid can hurt your throat if it is already sore. Drink enough fluid to keep your urine pale yellow. Talk with your health care provider about taking any vitamins and supplements, as high doses of some can alter the effectiveness of treatment therapy.  How to maintain healthy nutrition after cancer treatment The goals of your nutrition after cancer treatment are to achieve and maintaina healthy weight and to be physically active on a regular basis. What are tips for eating after treatment? After cancer treatment, eat plenty of proteins, carbohydrates, and fats. You can get each of these dietary elements from a wide range of foods. You may also want to eat a diet rich in plant-based foods because those have been shown tobe beneficial for cancer survivors. What foods should I eat after treatment? Here are some types of foods that you should include in your diet after treatment: Proteins. Protein is needed for growth, repairing body tissue and muscles, fighting infection, and helping to keep your body's defense system (immune system) healthy. When planning meals, choose lean proteins that do not have a lot of saturated fats. Include fish, lean  meat, skinless poultry, eggs, nonfat and low-fat dairy products, nuts, seeds, and legumes (like beans, peas, and lentils) in your daily diet. Carbohydrates. Carbohydrates are your body's main source of energy. They provide the fuel you need for physical activity and for your organs to work well. Most of the carbohydrates in your diet should come from foods that are high in essential nutrients and fiber, such as vegetables, fruits, whole grains, and legumes. Fruits and vegetables. Eat at least 2-3 cups of vegetables and 1-2 cups of fruits each day. You may choose to eat them fresh, frozen, canned, raw, cooked, or dried. If you want to heat the fruits or vegetables, consider microwaving or steaming them rather than boiling them in water. Microwaving and steaming keep their nutrients better. Dairy. Choose low-fat milk and other low-fat dairy products. Fats and oils. Fats help boost energy in your body. Choose healthy fats from nuts, seeds, avocados, fatty fish, and vegetable oils, including olive, avocado, canola, peanut, safflower, grapeseed, and sesame. Beverages. All your body's cells need water to work well, so drink plenty of water. Drink about eight 8-oz glasses of liquid each day so your body's cells get the fluid they need. Keep in mind that foods such as soups, milk, and even ice cream count toward that goal. The items listed above may not be a complete list of foods and beverages you can eat. Contact a dietitian for more information. What foods and beverages should I avoid or limit? Maintaining a healthy weight is important for life after treatment. Check ingredients and nutrition facts on packaged foods and beverages. Talk with a registered dietitian to create a specific eating plan for you. Follow these recommendations for general healthy eating: Avoid beverages that are high in sugar, including juices. Avoiding these will help you stay at a healthy weight and help with blood sugar  control. Limit high-fat foods, including fried foods and foods that come from animals. An example of a high-fat food from an animal is red meat, processed meats, or full-fat dairy. If you have diarrhea from treatment, fatty foods can make it worse. Limit foods that are high in added sugar, such as cookies, baked goods, and candy. Limit salt-cured, smoked, and pickled foods. Avoid alcohol. There is a link between alcohol and the risk of certain cancers. If  you choose to drink alcohol, it is best to limit intake to 1 drink a day for nonpregnant women and 2 drinks a day for men. In the [place] one drink equals one 12 oz bottle of beer (355 mL), one 5 oz glass of wine (148 mL), or one 1 oz glass of hard liquor (44 mL). Depending on your cancer diagnosis, your health care provider may recommend that you do not drink alcohol at all. Limit foods that are high in saturated fats, such as cheese and butter. Saturated fats may raise your risk of cancer. Limit processed foods, such as packaged meals and snacks. The items listed above may not be a complete list of foods and beverages you should avoid. Contact a dietitian for more information. Summary Good nutrition plays a big part in healing and keeping your body strong during and after cancer treatment. Ask to meet with a registered dietitian. A dietitian can help you create an eating plan that works well for you. During treatment, it may be helpful to eat small meals and snacks that are high in calories and protein. After cancer treatment, eat plenty of proteins, carbohydrates, and fats. You may also want to eat a diet rich in plant-based foods because those have been shown to be beneficial for cancer survivors. This information is not intended to replace advice given to you by your health care provider. Make sure you discuss any questions you have with your healthcare provider. Document Revised: 10/07/2019 Document Reviewed: 10/07/2019 [employer] Patient  Education  [place].

## 2021-07-12 NOTE — Progress Notes (Signed)
Ok to proceed without Urine pregnancy test results today. Recent cycle.  Raul Del Panola, [place], BCPS, BCOP 07/12/2021  11:51 AM

## 2021-07-19 ENCOUNTER — Inpatient Hospital Stay: Payer: Federal, State, Local not specified - PPO

## 2021-07-19 ENCOUNTER — Other Ambulatory Visit: Payer: Self-pay

## 2021-07-19 VITALS — BP 108/72 | HR 72 | Temp 98.9°F | Resp 16 | Wt 134.0 lb

## 2021-07-19 DIAGNOSIS — Z170 Estrogen receptor positive status [ER+]: Secondary | ICD-10-CM

## 2021-07-19 DIAGNOSIS — Z95828 Presence of other vascular implants and grafts: Secondary | ICD-10-CM

## 2021-07-19 DIAGNOSIS — C50211 Malignant neoplasm of upper-inner quadrant of right female breast: Secondary | ICD-10-CM | POA: Diagnosis not present

## 2021-07-19 LAB — CBC WITH DIFFERENTIAL (CANCER CENTER ONLY)
Abs Immature Granulocytes: 0.05 10*3/uL (ref 0.00–0.07)
Basophils Absolute: 0 10*3/uL (ref 0.0–0.1)
Basophils Relative: 1 %
Eosinophils Absolute: 0.1 10*3/uL (ref 0.0–0.5)
Eosinophils Relative: 1 %
HCT: 26.8 % — ABNORMAL LOW (ref 36.0–46.0)
Hemoglobin: 9.4 g/dL — ABNORMAL LOW (ref 12.0–15.0)
Immature Granulocytes: 1 %
Lymphocytes Relative: 17 %
Lymphs Abs: 0.8 10*3/uL (ref 0.7–4.0)
MCH: 32.1 pg (ref 26.0–34.0)
MCHC: 35.1 g/dL (ref 30.0–36.0)
MCV: 91.5 fL (ref 80.0–100.0)
Monocytes Absolute: 0.5 10*3/uL (ref 0.1–1.0)
Monocytes Relative: 10 %
Neutro Abs: 3.5 10*3/uL (ref 1.7–7.7)
Neutrophils Relative %: 70 %
Platelet Count: 260 10*3/uL (ref 150–400)
RBC: 2.93 MIL/uL — ABNORMAL LOW (ref 3.87–5.11)
RDW: 17.2 % — ABNORMAL HIGH (ref 11.5–15.5)
WBC Count: 5 10*3/uL (ref 4.0–10.5)
nRBC: 0 % (ref 0.0–0.2)

## 2021-07-19 LAB — CMP (CANCER CENTER ONLY)
ALT: 25 U/L (ref 0–44)
AST: 19 U/L (ref 15–41)
Albumin: 4 g/dL (ref 3.5–5.0)
Alkaline Phosphatase: 48 U/L (ref 38–126)
Anion gap: 7 (ref 5–15)
BUN: 11 mg/dL (ref 6–20)
CO2: 26 mmol/L (ref 22–32)
Calcium: 9.2 mg/dL (ref 8.9–10.3)
Chloride: 106 mmol/L (ref 98–111)
Creatinine: 0.64 mg/dL (ref 0.44–1.00)
GFR, Estimated: >60 mL/min (ref >60)
Glucose, Bld: 91 mg/dL (ref 70–99)
Potassium: 3.8 mmol/L (ref 3.5–5.1)
Sodium: 139 mmol/L (ref 135–145)
Total Bilirubin: 0.2 mg/dL — ABNORMAL LOW (ref 0.3–1.2)
Total Protein: 6.9 g/dL (ref 6.5–8.1)

## 2021-07-19 LAB — PREGNANCY, URINE: Preg Test, Ur: NEGATIVE

## 2021-07-19 MED ORDER — SODIUM CHLORIDE 0.9 % IV SOLN
10.0000 mg | Freq: Once | INTRAVENOUS | Status: AC
  Administered 2021-07-19: 10 mg via INTRAVENOUS
  Filled 2021-07-19: qty 10

## 2021-07-19 MED ORDER — SODIUM CHLORIDE 0.9% FLUSH
10.0000 mL | INTRAVENOUS | Status: DC | PRN
  Administered 2021-07-19: 10 mL

## 2021-07-19 MED ORDER — SODIUM CHLORIDE 0.9% FLUSH
10.0000 mL | Freq: Once | INTRAVENOUS | Status: AC
  Administered 2021-07-19: 10 mL

## 2021-07-19 MED ORDER — DIPHENHYDRAMINE HCL 50 MG/ML IJ SOLN
25.0000 mg | Freq: Once | INTRAMUSCULAR | Status: AC
  Administered 2021-07-19: 25 mg via INTRAVENOUS
  Filled 2021-07-19: qty 1

## 2021-07-19 MED ORDER — ONDANSETRON HCL 4 MG/2ML IJ SOLN
8.0000 mg | Freq: Once | INTRAMUSCULAR | Status: AC
  Administered 2021-07-19: 8 mg via INTRAVENOUS
  Filled 2021-07-19: qty 4

## 2021-07-19 MED ORDER — SODIUM CHLORIDE 0.9 % IV SOLN
80.0000 mg/m2 | Freq: Once | INTRAVENOUS | Status: AC
  Administered 2021-07-19: 132 mg via INTRAVENOUS
  Filled 2021-07-19: qty 22

## 2021-07-19 MED ORDER — HEPARIN SOD (PORK) LOCK FLUSH 100 UNIT/ML IV SOLN
500.0000 [IU] | Freq: Once | INTRAVENOUS | Status: AC | PRN
  Administered 2021-07-19: 500 [IU]

## 2021-07-19 MED ORDER — SODIUM CHLORIDE 0.9 % IV SOLN: Freq: Once | INTRAVENOUS | Status: AC

## 2021-07-19 MED ORDER — FAMOTIDINE 20 MG IN NS 100 ML IVPB
20.0000 mg | Freq: Once | INTRAVENOUS | Status: AC
  Administered 2021-07-19: 20 mg via INTRAVENOUS
  Filled 2021-07-19: qty 100

## 2021-07-19 NOTE — Patient Instructions (Signed)
[facility]  Discharge Instructions: Thank you for choosing [facility] to provide your oncology and hematology care.   If you have a lab appointment with the [facility], please go directly to the [facility] and check in at the registration area.   Wear comfortable clothing and clothing appropriate for easy access to any Portacath or PICC line.   We strive to give you quality time with your provider. You may need to reschedule your appointment if you arrive late (15 or more minutes).  Arriving late affects you and other patients whose appointments are after yours.  Also, if you miss three or more appointments without notifying the office, you may be dismissed from the clinic at the provider's discretion.      For prescription refill requests, have your pharmacy contact our office and allow 72 hours for refills to be completed.    Today you received the following chemotherapy and/or immunotherapy agents Paclitaxel (Taxol).      To help prevent nausea and vomiting after your treatment, we encourage you to take your nausea medication as directed.  BELOW ARE SYMPTOMS THAT SHOULD BE REPORTED IMMEDIATELY: *FEVER GREATER THAN 100.4 F (38 C) OR HIGHER *CHILLS OR SWEATING *NAUSEA AND VOMITING THAT IS NOT CONTROLLED WITH YOUR NAUSEA MEDICATION *UNUSUAL SHORTNESS OF BREATH *UNUSUAL BRUISING OR BLEEDING *URINARY PROBLEMS (pain or burning when urinating, or frequent urination) *BOWEL PROBLEMS (unusual diarrhea, constipation, pain near the anus) TENDERNESS IN MOUTH AND THROAT WITH OR WITHOUT PRESENCE OF ULCERS (sore throat, sores in mouth, or a toothache) UNUSUAL RASH, SWELLING OR PAIN  UNUSUAL VAGINAL DISCHARGE OR ITCHING   Items with * indicate a potential emergency and should be followed up as soon as possible or go to the Emergency Department if any problems should occur.  Please show the CHEMOTHERAPY ALERT CARD or IMMUNOTHERAPY ALERT CARD at  check-in to the Emergency Department and triage nurse.  Should you have questions after your visit or need to cancel or reschedule your appointment, please contact [facility]  Dept: [phone]  and follow the prompts.  Office hours are 8:00 a.m. to 4:30 p.m. Monday - Friday. Please note that voicemails left after 4:00 p.m. may not be returned until the following business day.  We are closed weekends and major holidays. You have access to a nurse at all times for urgent questions. Please call the main number to the clinic Dept: [phone] and follow the prompts.   For any non-urgent questions, you may also contact your provider using MyChart. We now offer e-Visits for anyone 6 and older to request care online for non-urgent symptoms. For details visit [email or web address].   Also download the MyChart app! Go to the app store, search "MyChart", open the app, select [facility], and log in with your MyChart username and password.  Due to Covid, a mask is required upon entering the hospital/clinic. If you do not have a mask, one will be given to you upon arrival. For doctor visits, patients may have 1 support person aged 101 or older with them. For treatment visits, patients cannot have anyone with them due to current Covid guidelines and our immunocompromised population.   Paclitaxel injection What is this medication? PACLITAXEL (PAK li TAX el) is a chemotherapy drug. It targets fast dividing cells, like cancer cells, and causes these cells to die. This medicine is used to treat ovarian cancer, breast cancer, lung cancer, Kaposi's sarcoma, andother cancers. This medicine may  be used for other purposes; ask your health care provider orpharmacist if you have questions. COMMON BRAND NAME(S): Onxol, Taxol What should I tell my care team before I take this medication? They need to know if you have any of these conditions: history of irregular heartbeat liver  disease low blood counts, like low white cell, platelet, or red cell counts lung or breathing disease, like asthma tingling of the fingers or toes, or other nerve disorder an unusual or allergic reaction to paclitaxel, alcohol, polyoxyethylated castor oil, other chemotherapy, other medicines, foods, dyes, or preservatives pregnant or trying to get pregnant breast-feeding How should I use this medication? This drug is given as an infusion into a vein. It is administered in a hospitalor clinic by a specially trained health care professional. Talk to your pediatrician regarding the use of this medicine in children.Special care may be needed. Overdosage: If you think you have taken too much of this medicine contact apoison control center or emergency room at once. NOTE: This medicine is only for you. Do not share this medicine with others. What if I miss a dose? It is important not to miss your dose. Call your doctor or health careprofessional if you are unable to keep an appointment. What may interact with this medication? Do not take this medicine with any of the following medications: live virus vaccines This medicine may also interact with the following medications: antiviral medicines for hepatitis, HIV or AIDS certain antibiotics like erythromycin and clarithromycin certain medicines for fungal infections like ketoconazole and itraconazole certain medicines for seizures like carbamazepine, phenobarbital, phenytoin gemfibrozil nefazodone rifampin St. John's wort This list may not describe all possible interactions. Give your health care provider a list of all the medicines, herbs, non-prescription drugs, or dietary supplements you use. Also tell them if you smoke, drink alcohol, or use illegaldrugs. Some items may interact with your medicine. What should I watch for while using this medication? Your condition will be monitored carefully while you are receiving this medicine. You will  need important blood work done while you are taking thismedicine. This medicine can cause serious allergic reactions. To reduce your risk you will need to take other medicine(s) before treatment with this medicine. If you experience allergic reactions like skin rash, itching or hives, swelling of theface, lips, or tongue, tell your doctor or health care professional right away. In some cases, you may be given additional medicines to help with side effects.Follow all directions for their use. This drug may make you feel generally unwell. This is not uncommon, as chemotherapy can affect healthy cells as well as cancer cells. Report any side effects. Continue your course of treatment even though you feel ill unless yourdoctor tells you to stop. Call your doctor or health care professional for advice if you get a fever, chills or sore throat, or other symptoms of a cold or flu. Do not treat yourself. This drug decreases your body's ability to fight infections. Try toavoid being around people who are sick. This medicine may increase your risk to bruise or bleed. Call your doctor orhealth care professional if you notice any unusual bleeding. Be careful brushing and flossing your teeth or using a toothpick because you may get an infection or bleed more easily. If you have any dental work done,tell your dentist you are receiving this medicine. Avoid taking products that contain aspirin, acetaminophen, ibuprofen, naproxen, or ketoprofen unless instructed by your doctor. These medicines may hide afever. Do not become pregnant while taking this  medicine. Women should inform their doctor if they wish to become pregnant or think they might be pregnant. There is a potential for serious side effects to an unborn child. Talk to your health care professional or pharmacist for more information. Do not breast-feed aninfant while taking this medicine. Men are advised not to father a child while receiving this medicine. This  product may contain alcohol. Ask your pharmacist or healthcare provider if this medicine contains alcohol. Be sure to tell all healthcare providers you are taking this medicine. Certain medicines, like metronidazole and disulfiram, can cause an unpleasant reaction when taken with alcohol. The reaction includes flushing, headache, nausea, vomiting, sweating, and increased thirst. Thereaction can last from 30 minutes to several hours. What side effects may I notice from receiving this medication? Side effects that you should report to your doctor or health care professionalas soon as possible: allergic reactions like skin rash, itching or hives, swelling of the face, lips, or tongue breathing problems changes in vision fast, irregular heartbeat high or low blood pressure mouth sores pain, tingling, numbness in the hands or feet signs of decreased platelets or bleeding - bruising, pinpoint red spots on the skin, black, tarry stools, blood in the urine signs of decreased red blood cells - unusually weak or tired, feeling faint or lightheaded, falls signs of infection - fever or chills, cough, sore throat, pain or difficulty passing urine signs and symptoms of liver injury like dark yellow or brown urine; general ill feeling or flu-like symptoms; light-colored stools; loss of appetite; nausea; right upper belly pain; unusually weak or tired; yellowing of the eyes or skin swelling of the ankles, feet, hands unusually slow heartbeat Side effects that usually do not require medical attention (report to yourdoctor or health care professional if they continue or are bothersome): diarrhea hair loss loss of appetite muscle or joint pain nausea, vomiting pain, redness, or irritation at site where injected tiredness This list may not describe all possible side effects. Call your doctor for medical advice about side effects. You may report side effects to FDA at1-800-FDA-1088. Where should I keep my  medication? This drug is given in a hospital or clinic and will not be stored at home. NOTE: This sheet is a summary. It may not cover all possible information. If you have questions about this medicine, talk to your doctor, pharmacist, orhealth care provider.  2022 Elsevier/Gold Standard (2019-10-22 13:37:23)  Fatigue If you have fatigue, you feel tired all the time and have a lack of energy or a lack of motivation. Fatigue may make it difficult to start or complete tasks because of exhaustion. In general, occasional or mild fatigue is often a normal response to activity or life. However, long-lasting (chronic) or extreme fatigue may be a symptom of a medical condition. Follow these instructions at home: General instructions Watch your fatigue for any changes. Go to bed and get up at the same time every day. Avoid fatigue by pacing yourself during the day and getting enough sleep at night. Maintain a healthy weight. Medicines Take over-the-counter and prescription medicines only as told by your health care provider. Take a multivitamin, if told by your health care provider.  Do not use herbal or dietary supplements unless they are approved by your health care provider. Activity  Exercise regularly, as told by your health care provider. Use or practice techniques to help you relax, such as yoga, tai chi, meditation, or massage therapy.  Eating and drinking  Avoid heavy meals in  the evening. Eat a well-balanced diet, which includes lean proteins, whole grains, plenty of fruits and vegetables, and low-fat dairy products. Avoid consuming too much caffeine. Avoid the use of alcohol. Drink enough fluid to keep your urine pale yellow.  Lifestyle Change situations that cause you stress. Try to keep your work and personal schedule in balance. Do not use any products that contain nicotine or tobacco, such as cigarettes and e-cigarettes. If you need help quitting, ask your health care  provider. Do not use drugs. Contact a health care provider if: Your fatigue does not get better. You have a fever. You suddenly lose or gain weight. You have headaches. You have trouble falling asleep or sleeping through the night. You feel angry, guilty, anxious, or sad. You are unable to have a bowel movement (constipation). Your skin is dry. You have swelling in your legs or another part of your body. Get help right away if: You feel confused. Your vision is blurry. You feel faint or you pass out. You have a severe headache. You have severe pain in your abdomen, your back, or the area between your waist and hips (pelvis). You have chest pain, shortness of breath, or an irregular or fast heartbeat. You are unable to urinate, or you urinate less than normal. You have abnormal bleeding, such as bleeding from the rectum, vagina, nose, lungs, or nipples. You vomit blood. You have thoughts about hurting yourself or others. If you ever feel like you may hurt yourself or others, or have thoughts about taking your own life, get help right away. You can go to your nearest emergency department or call: Your local emergency services (911 in the [place] A suicide crisis helpline, such as the [facility] at 236-149-5178. This is open 24 hours a day. Summary If you have fatigue, you feel tired all the time and have a lack of energy or a lack of motivation. Fatigue may make it difficult to start or complete tasks because of exhaustion. Long-lasting (chronic) or extreme fatigue may be a symptom of a medical condition. Exercise regularly, as told by your health care provider. Change situations that cause you stress. Try to keep your work and personal schedule in balance. This information is not intended to replace advice given to you by your health care provider. Make sure you discuss any questions you have with your healthcare provider. Document Revised: 09/30/2020  Document Reviewed: 09/30/2020 Elsevier Patient Education  2022 [place].  Rehydration, Adult Rehydration is the replacement of body fluids, salts, and minerals (electrolytes) that are lost during dehydration. Dehydration is when there is not enough water or other fluids in the body. This happens when you lose more fluids than you take in. Common causes of dehydration include: Not drinking enough fluids. This can occur when you are ill or doing activities that require a lot of energy, especially in hot weather. Conditions that cause loss of water or other fluids, such as diarrhea, vomiting, sweating, or urinating a lot. Other illnesses, such as fever or infection. Certain medicines, such as those that remove excess fluid from the body (diuretics). Symptoms of mild or moderate dehydration may include thirst, dry lips and mouth, and dizziness. Symptoms of severe dehydration may include increasedheart rate, confusion, fainting, and not urinating. For severe dehydration, you may need to get fluids through an IV at the hospital. For mild or moderate dehydration, you can usually rehydrate at homeby drinking certain fluids as told by your health care provider. What are  the risks? Generally, rehydration is safe. However, taking in too much fluid (overhydration) can be a problem. This is rare. Overhydration can cause an electrolyte imbalance, kidney failure, or a decrease in salt (sodium) levels in the body. Supplies needed You will need an oral rehydration solution (ORS) if your health care provider tells you to use one. This is a drink to treat dehydration. It can be found inpharmacies and retail stores. How to rehydrate Fluids Follow instructions from your health care provider for rehydration. The kind of fluid and the amount you should drink depend on your condition. In general, you should choose drinks that you prefer. If told by your health care provider, drink an ORS. Make an ORS by following  instructions on the package. Start by drinking small amounts, about  cup (120 mL) every 5-10 minutes. Slowly increase how much you drink until you have taken the amount recommended by your health care provider. Drink enough clear fluids to keep your urine pale yellow. If you were told to drink an ORS, finish it first, then start slowly drinking other clear fluids. Drink fluids such as: Water. This includes sparkling water and flavored water. Drinking only water can lead to having too little sodium in your body (hyponatremia). Follow the advice of your health care provider. Water from ice chips you suck on. Fruit juice with water you add to it (diluted). Sports drinks. Hot or cold herbal teas. Broth-based soups. Milk or milk products. Food Follow instructions from your health care provider about what to eat while you rehydrate. Your health care provider may recommend that you slowly begin eating regular foods in small amounts. Eat foods that contain a healthy balance of electrolytes, such as bananas, oranges, potatoes, tomatoes, and spinach. Avoid foods that are greasy or contain a lot of sugar. In some cases, you may get nutrition through a feeding tube that is passed through your nose and into your stomach (nasogastric tube, or NG tube). This may be done if you have uncontrolled vomiting or diarrhea. Beverages to avoid  Certain beverages may make dehydration worse. While you rehydrate, avoiddrinking alcohol. How to tell if you are recovering from dehydration You may be recovering from dehydration if: You are urinating more often than before you started rehydrating. Your urine is pale yellow. Your energy level improves. You vomit less frequently. You have diarrhea less frequently. Your appetite improves or returns to normal. You feel less dizzy or less light-headed. Your skin tone and color start to look more normal. Follow these instructions at home: Take over-the-counter and  prescription medicines only as told by your health care provider. Do not take sodium tablets. Doing this can lead to having too much sodium in your body (hypernatremia). Contact a health care provider if: You continue to have symptoms of mild or moderate dehydration, such as: Thirst. Dry lips. Slightly dry mouth. Dizziness. Dark urine or less urine than normal. Muscle cramps. You continue to vomit or have diarrhea. Get help right away if you: Have symptoms of dehydration that get worse. Have a fever. Have a severe headache. Have been vomiting and the following happens: Your vomiting gets worse or does not go away. Your vomit includes blood or green matter (bile). You cannot eat or drink without vomiting. Have problems with urination or bowel movements, such as: Diarrhea that gets worse or does not go away. Blood in your stool (feces). This may cause stool to look black and tarry. Not urinating, or urinating only a small amount of  very dark urine, within 6-8 hours. Have trouble breathing. Have symptoms that get worse with treatment. These symptoms may represent a serious problem that is an emergency. Do not wait to see if the symptoms will go away. Get medical help right away. Call your local emergency services (911 in the [place] Do not drive yourself to the hospital. Summary Rehydration is the replacement of body fluids and minerals (electrolytes) that are lost during dehydration. Follow instructions from your health care provider for rehydration. The kind of fluid and amount you should drink depend on your condition. Slowly increase how much you drink until you have taken the amount recommended by your health care provider. Contact your health care provider if you continue to show signs of mild or moderate dehydration. This information is not intended to replace advice given to you by your health care provider. Make sure you discuss any questions you have with your healthcare  provider. Document Revised: 01/21/2020 Document Reviewed: 12/01/2019 Elsevier Patient Education  2022 [place] and Nutrition What you eat both during and after your cancer treatment plays a big part in your recovery. During treatment, eating well helps you stay strong, provides you with energy for tissue healing, and helps you fight infection. It may also help with side effects. When treatment ends and you start feeling better, eating well helps you rebuild tissue, gain strength and energy, and feel betteroverall. Choosing what is best to eat and drink during and after treatment can be a challenge. If you need help, ask to be referred to a registered dietitian. Adietitian can help you create a balanced eating plan. How to maintain healthy nutrition during cancer treatment What are tips for eating during treatment? During treatment, your tastes may change, so focus on foods that taste good to you. Eat your largest meal when you feel the most hungry. Limit drinking during the meal to increase food intake. Drink between meals to prevent dehydration. If you have lost your appetite or cannot keep food down, your cancer care team may suggest that you consume foods or drinks that have had nutrients added to them (are fortified) or are higher in calories, fat, and protein to help prevent weight loss. If you are still not able to meet your nutrition needs, you may benefit from other forms of nutrition support. Your care team may suggest appetite stimulants, tube feeding, or nutrition through an IV inserted into one of your veins. What foods should I eat during treatment?  Appetite is often altered during treatment. Some will have low appetite due to side effects of treatment. Others may gain weight due to medicines, less activity, and stress. Talk with a dietitian to help create the best eating plan for your situation. For a few meals each week, try eating plant-based foods that are  high in protein, such as beans and peas, instead of meat. Include lots of whole grains, fruits, and vegetables. Eat at least 2 cups of fruits and vegetables a day. Include fresh fruits and dark-green and deep-yellow vegetables, which are full of natural, healthy substances. Remember to wash fresh produce thoroughly before eating to reduce any risk for infection. Throughout the day, eat small snacks that are high in calories and protein. High-protein snacks that are easy to prepare and eat include: Yogurt. Cereal and milk. Half of a sandwich. A bowl of hearty soup. Cheese and crackers. Avoid snacks that could make side effects from treatment worse. For instance, popcorn, raw fruits, and raw vegetables can  make diarrhea worse. Dry, coarse snacks or foods high in acid can hurt your throat if it is already sore. Drink enough fluid to keep your urine pale yellow. Talk with your health care provider about taking any vitamins and supplements, as high doses of some can alter the effectiveness of treatment therapy.  How to maintain healthy nutrition after cancer treatment The goals of your nutrition after cancer treatment are to achieve and maintaina healthy weight and to be physically active on a regular basis. What are tips for eating after treatment? After cancer treatment, eat plenty of proteins, carbohydrates, and fats. You can get each of these dietary elements from a wide range of foods. You may also want to eat a diet rich in plant-based foods because those have been shown tobe beneficial for cancer survivors. What foods should I eat after treatment? Here are some types of foods that you should include in your diet after treatment: Proteins. Protein is needed for growth, repairing body tissue and muscles, fighting infection, and helping to keep your body's defense system (immune system) healthy. When planning meals, choose lean proteins that do not have a lot of saturated fats. Include fish, lean  meat, skinless poultry, eggs, nonfat and low-fat dairy products, nuts, seeds, and legumes (like beans, peas, and lentils) in your daily diet. Carbohydrates. Carbohydrates are your body's main source of energy. They provide the fuel you need for physical activity and for your organs to work well. Most of the carbohydrates in your diet should come from foods that are high in essential nutrients and fiber, such as vegetables, fruits, whole grains, and legumes. Fruits and vegetables. Eat at least 2-3 cups of vegetables and 1-2 cups of fruits each day. You may choose to eat them fresh, frozen, canned, raw, cooked, or dried. If you want to heat the fruits or vegetables, consider microwaving or steaming them rather than boiling them in water. Microwaving and steaming keep their nutrients better. Dairy. Choose low-fat milk and other low-fat dairy products. Fats and oils. Fats help boost energy in your body. Choose healthy fats from nuts, seeds, avocados, fatty fish, and vegetable oils, including olive, avocado, canola, peanut, safflower, grapeseed, and sesame. Beverages. All your body's cells need water to work well, so drink plenty of water. Drink about eight 8-oz glasses of liquid each day so your body's cells get the fluid they need. Keep in mind that foods such as soups, milk, and even ice cream count toward that goal. The items listed above may not be a complete list of foods and beverages you can eat. Contact a dietitian for more information. What foods and beverages should I avoid or limit? Maintaining a healthy weight is important for life after treatment. Check ingredients and nutrition facts on packaged foods and beverages. Talk with a registered dietitian to create a specific eating plan for you. Follow these recommendations for general healthy eating: Avoid beverages that are high in sugar, including juices. Avoiding these will help you stay at a healthy weight and help with blood sugar  control. Limit high-fat foods, including fried foods and foods that come from animals. An example of a high-fat food from an animal is red meat, processed meats, or full-fat dairy. If you have diarrhea from treatment, fatty foods can make it worse. Limit foods that are high in added sugar, such as cookies, baked goods, and candy. Limit salt-cured, smoked, and pickled foods. Avoid alcohol. There is a link between alcohol and the risk of certain cancers. If  you choose to drink alcohol, it is best to limit intake to 1 drink a day for nonpregnant women and 2 drinks a day for men. In the [place] one drink equals one 12 oz bottle of beer (355 mL), one 5 oz glass of wine (148 mL), or one 1 oz glass of hard liquor (44 mL). Depending on your cancer diagnosis, your health care provider may recommend that you do not drink alcohol at all. Limit foods that are high in saturated fats, such as cheese and butter. Saturated fats may raise your risk of cancer. Limit processed foods, such as packaged meals and snacks. The items listed above may not be a complete list of foods and beverages you should avoid. Contact a dietitian for more information. Summary Good nutrition plays a big part in healing and keeping your body strong during and after cancer treatment. Ask to meet with a registered dietitian. A dietitian can help you create an eating plan that works well for you. During treatment, it may be helpful to eat small meals and snacks that are high in calories and protein. After cancer treatment, eat plenty of proteins, carbohydrates, and fats. You may also want to eat a diet rich in plant-based foods because those have been shown to be beneficial for cancer survivors. This information is not intended to replace advice given to you by your health care provider. Make sure you discuss any questions you have with your healthcare provider. Document Revised: 10/07/2019 Document Reviewed: 10/07/2019 Elsevier Patient  Education  [place].

## 2021-07-25 NOTE — Assessment & Plan Note (Signed)
04/08/2021:Palpable right breast mass: Mammogram and US showed a 4.5cm right breast mass at the 12-1 o'clock position with associated microcalcifications, multiple additional masses from the 9-11 o'clock position in the right breast, calcifications in the subareolar right breast, 1.4cm, and two enlarged right axillary lymph nodes. 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 Neoadjuvant chemotherapy with dose dense Adriamycin and Cytoxan x4 followed by Taxol weekly x12 2. mastectomy with targeted node dissection 3. Adjuvant radiation 4. Followed by adjuvant antiestrogen therapy -------------------------------------------------------------------------------------------------------------------- Current treatment:completed 4 cycles ofdose dense Adriamycin Cytoxan, today is cycle 4 Taxol  Chemo toxicities: Mild fatigue Tongue discoloration Occasional mouth sores Chemo induced anemia: Hemoglobin today is 10: Monitoring Menstrual cycles have returned: We discussed that it is very important to avoid getting pregnant during chemo.    Denies any nausea or vomiting. Blood counts have been reviewed.  Return to clinic inweekly for chemo

## 2021-07-25 NOTE — Progress Notes (Signed)
Patient Care Team: Patient, [name] (Inactive) as PCP - General (General Practice) [name], RN as Oncology Nurse Navigator [name], RN as Oncology Nurse Navigator  DIAGNOSIS:    ICD-10-CM   1. Malignant neoplasm of upper-inner quadrant of right breast in female, estrogen receptor positive ([place])  C50.211    Z17.0       SUMMARY OF ONCOLOGIC HISTORY: Oncology History  Malignant neoplasm of upper-inner quadrant of right breast in female, estrogen receptor positive ([place])  04/08/2021 Initial Diagnosis   Palpable right breast mass: Mammogram and US showed a 4.5cm right breast mass at the 12-1 o'clock position with associated microcalcifications, multiple additional masses from the 9-11 o'clock position in the right breast, calcifications in the subareolar right breast, 1.4cm, and two enlarged right axillary lymph nodes. 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 Two variants of uncertain significance (VUS) were detected - one in the APC gene called p.T2422I (c.7265C>T) and a second in the BAP1 gene called p.P302L (c.905C>T). The report dates are 04/19/2021 and 04/25/2021, respectively.  The BRCAplus panel offered by [employer] and includes sequencing and deletion/duplication analysis for the following 8 genes: ATM, BRCA1, BRCA2, CDH1, CHEK2, PALB2, PTEN, and TP53. The CancerNext-Expanded + RNAinsight gene panel offered by [employer] and includes sequencing and  rearrangement analysis for the following 77 genes: AIP, ALK, APC, ATM, AXIN2, BAP1, BARD1, BLM, BMPR1A, BRCA1, BRCA2, BRIP1, CDC73, CDH1, CDK4, CDKN1B, CDKN2A, CHEK2, CTNNA1, DICER1, FANCC, FH, FLCN, GALNT12, KIF1B, LZTR1, MAX, MEN1, MET, MLH1, MSH2, MSH3, MSH6, MUTYH, NBN, NF1, NF2, NTHL1, PALB2, PHOX2B, PMS2, POT1, PRKAR1A, PTCH1, PTEN, RAD51C, RAD51D, RB1, RECQL, RET, SDHA, SDHAF2, SDHB, SDHC, SDHD, SMAD4, SMARCA4, SMARCB1, SMARCE1, STK11, SUFU, TMEM127, TP53, TSC1, TSC2, VHL and XRCC2 (sequencing and deletion/duplication); EGFR, EGLN1, HOXB13, KIT, MITF, PDGFRA, POLD1 and POLE (sequencing only); EPCAM and GREM1 (deletion/duplication only). RNA data is routinely analyzed for use in variant interpretation for all genes.    05/10/2021 -  Chemotherapy    Patient is on Treatment Plan: BREAST ADJUVANT DOSE DENSE AC Q14D / PACLITAXEL Q7D         CHIEF COMPLIANT: Cycle 4 Taxol  INTERVAL HISTORY: Obera Stauch is a 40 y.o. with above-mentioned history of right breast cancer having completed neoadjuvant chemotherapy with dose dense Adriamycin and Cytoxan, currently on chemotherapy with Taxol. She reports to the clinic today for Cycle 4.   ALLERGIES:  has No Known Allergies.  MEDICATIONS:  Current Outpatient Medications  Medication Sig Dispense Refill   acetaminophen (TYLENOL) 500 MG tablet Take 500-1,000 mg by mouth every 6 (six) hours as needed for moderate pain.     Ascorbic Acid (VITAMIN C PO) Take 1 tablet by mouth daily.     cetirizine (ZYRTEC) 10 MG tablet Take 10 mg by mouth daily as needed for allergies.     ELDERBERRY PO Take 1 capsule by mouth daily.     hydrocortisone cream 1 % Apply 1 application topically 2 (two) times daily as  needed for itching.     lidocaine-prilocaine (EMLA) cream Apply to affected area once 30 g 3   Multiple Vitamin (MULTIVITAMIN WITH MINERALS) TABS tablet Take 2 tablets by mouth daily.     Multiple Vitamins-Minerals (ZINC PO) Take 1 tablet by mouth once a  week.     nystatin (MYCOSTATIN) 100000 UNIT/ML suspension TAKE 5 MLS (500,000 UNITS TOTAL) BY MOUTH IN THE MORNING AND AT BEDTIME. 60 mL 0   ondansetron (ZOFRAN) 8 MG tablet Take 1 tablet (8 mg total) by mouth 2 (two) times daily as needed. Start on the third day after chemotherapy. 30 tablet 1   prochlorperazine (COMPAZINE) 10 MG tablet Take 1 tablet (10 mg total) by mouth every 6 (six) hours as needed (Nausea or vomiting). 30 tablet 1   sodium chloride (OCEAN) 0.65 % SOLN nasal spray Place 1 spray into both nostrils as needed for congestion.     No current facility-administered medications for this visit.    PHYSICAL EXAMINATION: ECOG PERFORMANCE STATUS: 1 - Symptomatic but completely ambulatory  Vitals:   07/26/21 1322  BP: 133/80  Pulse: 79  Resp: 18  Temp: (!) 97.5 F (36.4 C)  SpO2: 100%   Filed Weights   07/26/21 1322  Weight: 133 lb 14.4 oz (60.7 kg)    LABORATORY DATA:  I have reviewed the data as listed CMP Latest Ref Rng & Units 07/19/2021 07/12/2021 07/05/2021  Glucose 70 - 99 mg/dL 91 98 86  BUN 6 - 20 mg/dL $Remove'11 8 9  'UynQPGB$ Creatinine 0.44 - 1.00 mg/dL 0.64 0.67 0.69  Sodium 135 - 145 mmol/L 139 139 138  Potassium 3.5 - 5.1 mmol/L 3.8 3.9 4.0  Chloride 98 - 111 mmol/L 106 107 106  CO2 22 - 32 mmol/L $RemoveB'26 24 26  'TbuHTqpn$ Calcium 8.9 - 10.3 mg/dL 9.2 9.4 9.2  Total Protein 6.5 - 8.1 g/dL 6.9 7.2 6.7  Total Bilirubin 0.3 - 1.2 mg/dL 0.2(L) <0.2(L) <0.2(L)  Alkaline Phos 38 - 126 U/L 48 51 81  AST 15 - 41 U/L 19 18 14(L)  ALT 0 - 44 U/L $Remo'25 22 9    'tnvQU$ Lab Results  Component Value Date   WBC 5.7 07/26/2021   HGB 9.5 (L) 07/26/2021   HCT 27.4 (L) 07/26/2021   MCV 93.8 07/26/2021   PLT 238 07/26/2021   NEUTROABS 4.2 07/26/2021    ASSESSMENT & PLAN:  Malignant neoplasm of upper-inner quadrant of right breast in female, estrogen receptor positive ([place]) 04/08/2021:Palpable right breast mass: Mammogram and US showed a 4.5cm right breast mass at the 12-1 o'clock position with associated  microcalcifications, multiple additional masses from the 9-11 o'clock position in the right breast, calcifications in the subareolar right breast, 1.4cm, and two enlarged right axillary lymph nodes. 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  Neoadjuvant chemotherapy with dose dense Adriamycin and Cytoxan x4 followed by Taxol weekly x12 2. mastectomy with targeted node dissection 3.  Adjuvant radiation 4.  Followed by adjuvant antiestrogen therapy -------------------------------------------------------------------------------------------------------------------- Current treatment: completed 4 cycles of dose dense Adriamycin Cytoxan, today is cycle 4 Taxol   Chemo toxicities:  Mild fatigue Tongue discoloration Chemo induced anemia: Hemoglobin today is 9.5: Monitoring Menstrual cycles have returned:    Monitoring  closely for neuropathy.  She tells me that her hand falls asleep at nighttime but gets better when she moves it.  She has not noticed any difficulty with buttoning her shirt or writing and she has not had any troubles with walking or gait.  Denies any nausea or vomiting. Blood counts have been reviewed.   Return to clinic in weekly for chemo    No orders of the defined types were placed in this encounter.  The patient has a good understanding of the overall plan. she agrees with it. she will call with any problems that may develop before the next visit here.  Total time spent: 30 mins including face to face time and time spent for planning, charting and coordination of care  Rulon Eisenmenger, MD, MPH 07/26/2021  I, Thana Ates, am acting as scribe for Dr. Nicholas Lose.  I have reviewed the above documentation  for accuracy and completeness, and I agree with the above.

## 2021-07-26 ENCOUNTER — Inpatient Hospital Stay: Payer: Federal, State, Local not specified - PPO | Admitting: Hematology and Oncology

## 2021-07-26 ENCOUNTER — Other Ambulatory Visit: Payer: Self-pay

## 2021-07-26 ENCOUNTER — Inpatient Hospital Stay: Payer: Federal, State, Local not specified - PPO

## 2021-07-26 DIAGNOSIS — C50211 Malignant neoplasm of upper-inner quadrant of right female breast: Secondary | ICD-10-CM

## 2021-07-26 DIAGNOSIS — Z95828 Presence of other vascular implants and grafts: Secondary | ICD-10-CM

## 2021-07-26 DIAGNOSIS — Z170 Estrogen receptor positive status [ER+]: Secondary | ICD-10-CM | POA: Diagnosis not present

## 2021-07-26 LAB — COMPREHENSIVE METABOLIC PANEL
ALT: 19 U/L (ref 0–44)
AST: 18 U/L (ref 15–41)
Albumin: 4 g/dL (ref 3.5–5.0)
Alkaline Phosphatase: 47 U/L (ref 38–126)
Anion gap: 8 (ref 5–15)
BUN: 10 mg/dL (ref 6–20)
CO2: 26 mmol/L (ref 22–32)
Calcium: 9.3 mg/dL (ref 8.9–10.3)
Chloride: 107 mmol/L (ref 98–111)
Creatinine, Ser: 0.69 mg/dL (ref 0.44–1.00)
GFR, Estimated: >60 mL/min (ref >60)
Glucose, Bld: 86 mg/dL (ref 70–99)
Potassium: 3.8 mmol/L (ref 3.5–5.1)
Sodium: 141 mmol/L (ref 135–145)
Total Bilirubin: 0.3 mg/dL (ref 0.3–1.2)
Total Protein: 6.9 g/dL (ref 6.5–8.1)

## 2021-07-26 LAB — CBC WITH DIFFERENTIAL/PLATELET
Abs Immature Granulocytes: 0.02 10*3/uL (ref 0.00–0.07)
Basophils Absolute: 0 10*3/uL (ref 0.0–0.1)
Basophils Relative: 0 %
Eosinophils Absolute: 0.1 10*3/uL (ref 0.0–0.5)
Eosinophils Relative: 1 %
HCT: 27.4 % — ABNORMAL LOW (ref 36.0–46.0)
Hemoglobin: 9.5 g/dL — ABNORMAL LOW (ref 12.0–15.0)
Immature Granulocytes: 0 %
Lymphocytes Relative: 17 %
Lymphs Abs: 1 10*3/uL (ref 0.7–4.0)
MCH: 32.5 pg (ref 26.0–34.0)
MCHC: 34.7 g/dL (ref 30.0–36.0)
MCV: 93.8 fL (ref 80.0–100.0)
Monocytes Absolute: 0.5 10*3/uL (ref 0.1–1.0)
Monocytes Relative: 8 %
Neutro Abs: 4.2 10*3/uL (ref 1.7–7.7)
Neutrophils Relative %: 74 %
Platelets: 238 10*3/uL (ref 150–400)
RBC: 2.92 MIL/uL — ABNORMAL LOW (ref 3.87–5.11)
RDW: 17.3 % — ABNORMAL HIGH (ref 11.5–15.5)
WBC: 5.7 10*3/uL (ref 4.0–10.5)
nRBC: 0 % (ref 0.0–0.2)

## 2021-07-26 LAB — PREGNANCY, URINE: Preg Test, Ur: NEGATIVE

## 2021-07-26 MED ORDER — DIPHENHYDRAMINE HCL 50 MG/ML IJ SOLN
25.0000 mg | Freq: Once | INTRAMUSCULAR | Status: AC
  Administered 2021-07-26: 25 mg via INTRAVENOUS
  Filled 2021-07-26: qty 1

## 2021-07-26 MED ORDER — SODIUM CHLORIDE 0.9 % IV SOLN: Freq: Once | INTRAVENOUS | Status: AC

## 2021-07-26 MED ORDER — SODIUM CHLORIDE 0.9 % IV SOLN
10.0000 mg | Freq: Once | INTRAVENOUS | Status: AC
  Administered 2021-07-26: 10 mg via INTRAVENOUS
  Filled 2021-07-26: qty 10

## 2021-07-26 MED ORDER — SODIUM CHLORIDE 0.9% FLUSH
10.0000 mL | INTRAVENOUS | Status: DC | PRN
  Administered 2021-07-26: 10 mL

## 2021-07-26 MED ORDER — HEPARIN SOD (PORK) LOCK FLUSH 100 UNIT/ML IV SOLN
500.0000 [IU] | Freq: Once | INTRAVENOUS | Status: AC | PRN
  Administered 2021-07-26: 500 [IU]

## 2021-07-26 MED ORDER — ONDANSETRON HCL 4 MG/2ML IJ SOLN
8.0000 mg | Freq: Once | INTRAMUSCULAR | Status: AC
  Administered 2021-07-26: 8 mg via INTRAVENOUS
  Filled 2021-07-26: qty 4

## 2021-07-26 MED ORDER — SODIUM CHLORIDE 0.9% FLUSH
10.0000 mL | Freq: Once | INTRAVENOUS | Status: AC
Start: 2021-07-26 — End: 2021-07-26
  Administered 2021-07-26: 10 mL

## 2021-07-26 MED ORDER — FAMOTIDINE 20 MG IN NS 100 ML IVPB
20.0000 mg | Freq: Once | INTRAVENOUS | Status: AC
  Administered 2021-07-26: 20 mg via INTRAVENOUS
  Filled 2021-07-26: qty 100

## 2021-07-26 MED ORDER — SODIUM CHLORIDE 0.9 % IV SOLN
80.0000 mg/m2 | Freq: Once | INTRAVENOUS | Status: AC
  Administered 2021-07-26: 132 mg via INTRAVENOUS
  Filled 2021-07-26: qty 22

## 2021-07-26 NOTE — Patient Instructions (Signed)
[facility]   Discharge Instructions: Thank you for choosing [facility] to provide your oncology and hematology care.   If you have a lab appointment with the Cancer Center, please go directly to the Cancer Center and check in at the registration area.   Wear comfortable clothing and clothing appropriate for easy access to any Portacath or PICC line.   We strive to give you quality time with your provider. You may need to reschedule your appointment if you arrive late (15 or more minutes).  Arriving late affects you and other patients whose appointments are after yours.  Also, if you miss three or more appointments without notifying the office, you may be dismissed from the clinic at the provider's discretion.      For prescription refill requests, have your pharmacy contact our office and allow 72 hours for refills to be completed.    Today you received the following chemotherapy and/or immunotherapy agents: paclitaxel.      To help prevent nausea and vomiting after your treatment, we encourage you to take your nausea medication as directed.  BELOW ARE SYMPTOMS THAT SHOULD BE REPORTED IMMEDIATELY: *FEVER GREATER THAN 100.4 F (38 C) OR HIGHER *CHILLS OR SWEATING *NAUSEA AND VOMITING THAT IS NOT CONTROLLED WITH YOUR NAUSEA MEDICATION *UNUSUAL SHORTNESS OF BREATH *UNUSUAL BRUISING OR BLEEDING *URINARY PROBLEMS (pain or burning when urinating, or frequent urination) *BOWEL PROBLEMS (unusual diarrhea, constipation, pain near the anus) TENDERNESS IN MOUTH AND THROAT WITH OR WITHOUT PRESENCE OF ULCERS (sore throat, sores in mouth, or a toothache) UNUSUAL RASH, SWELLING OR PAIN  UNUSUAL VAGINAL DISCHARGE OR ITCHING   Items with * indicate a potential emergency and should be followed up as soon as possible or go to the Emergency Department if any problems should occur.  Please show the CHEMOTHERAPY ALERT CARD or IMMUNOTHERAPY ALERT CARD at check-in  to the Emergency Department and triage nurse.  Should you have questions after your visit or need to cancel or reschedule your appointment, please contact [facility]  Dept: [phone]  and follow the prompts.  Office hours are 8:00 a.m. to 4:30 p.m. Monday - Friday. Please note that voicemails left after 4:00 p.m. may not be returned until the following business day.  We are closed weekends and major holidays. You have access to a nurse at all times for urgent questions. Please call the main number to the clinic Dept: [phone] and follow the prompts.   For any non-urgent questions, you may also contact your provider using MyChart. We now offer e-Visits for anyone 18 and older to request care online for non-urgent symptoms. For details visit [email or web address].   Also download the MyChart app! Go to the app store, search "MyChart", open the app, select [facility], and log in with your MyChart username and password.  Due to Covid, a mask is required upon entering the hospital/clinic. If you do not have a mask, one will be given to you upon arrival. For doctor visits, patients may have 1 support person aged 18 or older with them. For treatment visits, patients cannot have anyone with them due to current Covid guidelines and our immunocompromised population.   

## 2021-08-02 ENCOUNTER — Inpatient Hospital Stay: Payer: Federal, State, Local not specified - PPO

## 2021-08-02 ENCOUNTER — Other Ambulatory Visit: Payer: Self-pay

## 2021-08-02 VITALS — BP 129/82 | HR 90 | Temp 98.2°F | Resp 18

## 2021-08-02 DIAGNOSIS — C50211 Malignant neoplasm of upper-inner quadrant of right female breast: Secondary | ICD-10-CM

## 2021-08-02 DIAGNOSIS — Z170 Estrogen receptor positive status [ER+]: Secondary | ICD-10-CM

## 2021-08-02 DIAGNOSIS — Z95828 Presence of other vascular implants and grafts: Secondary | ICD-10-CM

## 2021-08-02 LAB — CBC WITH DIFFERENTIAL/PLATELET
Abs Immature Granulocytes: 0.04 10*3/uL (ref 0.00–0.07)
Basophils Absolute: 0 10*3/uL (ref 0.0–0.1)
Basophils Relative: 0 %
Eosinophils Absolute: 0.1 10*3/uL (ref 0.0–0.5)
Eosinophils Relative: 1 %
HCT: 29.3 % — ABNORMAL LOW (ref 36.0–46.0)
Hemoglobin: 9.9 g/dL — ABNORMAL LOW (ref 12.0–15.0)
Immature Granulocytes: 0 %
Lymphocytes Relative: 11 %
Lymphs Abs: 1 10*3/uL (ref 0.7–4.0)
MCH: 32 pg (ref 26.0–34.0)
MCHC: 33.8 g/dL (ref 30.0–36.0)
MCV: 94.8 fL (ref 80.0–100.0)
Monocytes Absolute: 0.5 10*3/uL (ref 0.1–1.0)
Monocytes Relative: 5 %
Neutro Abs: 7.4 10*3/uL (ref 1.7–7.7)
Neutrophils Relative %: 83 %
Platelets: 273 10*3/uL (ref 150–400)
RBC: 3.09 MIL/uL — ABNORMAL LOW (ref 3.87–5.11)
RDW: 16.9 % — ABNORMAL HIGH (ref 11.5–15.5)
WBC: 8.9 10*3/uL (ref 4.0–10.5)
nRBC: 0 % (ref 0.0–0.2)

## 2021-08-02 LAB — COMPREHENSIVE METABOLIC PANEL
ALT: 17 U/L (ref 0–44)
AST: 18 U/L (ref 15–41)
Albumin: 3.9 g/dL (ref 3.5–5.0)
Alkaline Phosphatase: 49 U/L (ref 38–126)
Anion gap: 8 (ref 5–15)
BUN: 9 mg/dL (ref 6–20)
CO2: 26 mmol/L (ref 22–32)
Calcium: 9.2 mg/dL (ref 8.9–10.3)
Chloride: 106 mmol/L (ref 98–111)
Creatinine, Ser: 0.67 mg/dL (ref 0.44–1.00)
GFR, Estimated: >60 mL/min (ref >60)
Glucose, Bld: 94 mg/dL (ref 70–99)
Potassium: 3.7 mmol/L (ref 3.5–5.1)
Sodium: 140 mmol/L (ref 135–145)
Total Bilirubin: 0.3 mg/dL (ref 0.3–1.2)
Total Protein: 6.9 g/dL (ref 6.5–8.1)

## 2021-08-02 LAB — PREGNANCY, URINE: Preg Test, Ur: NEGATIVE

## 2021-08-02 MED ORDER — FAMOTIDINE 20 MG IN NS 100 ML IVPB
20.0000 mg | Freq: Once | INTRAVENOUS | Status: AC
  Administered 2021-08-02: 20 mg via INTRAVENOUS
  Filled 2021-08-02: qty 100

## 2021-08-02 MED ORDER — HEPARIN SOD (PORK) LOCK FLUSH 100 UNIT/ML IV SOLN
500.0000 [IU] | Freq: Once | INTRAVENOUS | Status: AC | PRN
  Administered 2021-08-02: 500 [IU]

## 2021-08-02 MED ORDER — SODIUM CHLORIDE 0.9 % IV SOLN: Freq: Once | INTRAVENOUS | Status: AC

## 2021-08-02 MED ORDER — SODIUM CHLORIDE 0.9% FLUSH
10.0000 mL | INTRAVENOUS | Status: DC | PRN
  Administered 2021-08-02: 10 mL

## 2021-08-02 MED ORDER — SODIUM CHLORIDE 0.9% FLUSH
10.0000 mL | Freq: Once | INTRAVENOUS | Status: AC
  Administered 2021-08-02: 10 mL

## 2021-08-02 MED ORDER — ONDANSETRON HCL 4 MG/2ML IJ SOLN
8.0000 mg | Freq: Once | INTRAMUSCULAR | Status: AC
  Administered 2021-08-02: 8 mg via INTRAVENOUS
  Filled 2021-08-02: qty 4

## 2021-08-02 MED ORDER — DIPHENHYDRAMINE HCL 50 MG/ML IJ SOLN
25.0000 mg | Freq: Once | INTRAMUSCULAR | Status: AC
  Administered 2021-08-02: 25 mg via INTRAVENOUS
  Filled 2021-08-02: qty 1

## 2021-08-02 MED ORDER — SODIUM CHLORIDE 0.9 % IV SOLN
80.0000 mg/m2 | Freq: Once | INTRAVENOUS | Status: AC
  Administered 2021-08-02: 132 mg via INTRAVENOUS
  Filled 2021-08-02: qty 22

## 2021-08-02 MED ORDER — SODIUM CHLORIDE 0.9 % IV SOLN
10.0000 mg | Freq: Once | INTRAVENOUS | Status: AC
  Administered 2021-08-02: 10 mg via INTRAVENOUS
  Filled 2021-08-02: qty 10

## 2021-08-02 NOTE — Patient Instructions (Signed)
[facility] ONCOLOGY  Discharge Instructions: Thank you for choosing [facility] to provide your oncology and hematology care.   If you have a lab appointment with the [facility], please go directly to the [facility] and check in at the registration area.   Wear comfortable clothing and clothing appropriate for easy access to any Portacath or PICC line.   We strive to give you quality time with your provider. You may need to reschedule your appointment if you arrive late (15 or more minutes).  Arriving late affects you and other patients whose appointments are after yours.  Also, if you miss three or more appointments without notifying the office, you may be dismissed from the clinic at the provider's discretion.      For prescription refill requests, have your pharmacy contact our office and allow 72 hours for refills to be completed.    Today you received the following chemotherapy and/or immunotherapy agents: Taxol.      To help prevent nausea and vomiting after your treatment, we encourage you to take your nausea medication as directed.  BELOW ARE SYMPTOMS THAT SHOULD BE REPORTED IMMEDIATELY: *FEVER GREATER THAN 100.4 F (38 C) OR HIGHER *CHILLS OR SWEATING *NAUSEA AND VOMITING THAT IS NOT CONTROLLED WITH YOUR NAUSEA MEDICATION *UNUSUAL SHORTNESS OF BREATH *UNUSUAL BRUISING OR BLEEDING *URINARY PROBLEMS (pain or burning when urinating, or frequent urination) *BOWEL PROBLEMS (unusual diarrhea, constipation, pain near the anus) TENDERNESS IN MOUTH AND THROAT WITH OR WITHOUT PRESENCE OF ULCERS (sore throat, sores in mouth, or a toothache) UNUSUAL RASH, SWELLING OR PAIN  UNUSUAL VAGINAL DISCHARGE OR ITCHING   Items with * indicate a potential emergency and should be followed up as soon as possible or go to the Emergency Department if any problems should occur.  Please show the CHEMOTHERAPY ALERT CARD or IMMUNOTHERAPY ALERT CARD at check-in to the  Emergency Department and triage nurse.  Should you have questions after your visit or need to cancel or reschedule your appointment, please contact [facility]  Dept: [phone]  and follow the prompts.  Office hours are 8:00 a.m. to 4:30 p.m. Monday - Friday. Please note that voicemails left after 4:00 p.m. may not be returned until the following business day.  We are closed weekends and major holidays. You have access to a nurse at all times for urgent questions. Please call the main number to the clinic Dept: [phone] and follow the prompts.   For any non-urgent questions, you may also contact your provider using MyChart. We now offer e-Visits for anyone 25 and older to request care online for non-urgent symptoms. For details visit [email or web address].   Also download the MyChart app! Go to the app store, search "MyChart", open the app, select [facility], and log in with your MyChart username and password.  Due to Covid, a mask is required upon entering the hospital/clinic. If you do not have a mask, one will be given to you upon arrival. For doctor visits, patients may have 1 support person aged 65 or older with them. For treatment visits, patients cannot have anyone with them due to current Covid guidelines and our immunocompromised population.

## 2021-08-07 NOTE — Progress Notes (Signed)
Patient Care Team: Patient, [name] (Inactive) as PCP - General (General Practice) Pershing Proud, RN as Oncology Nurse Navigator Donnelly Angelica, RN as Oncology Nurse Navigator  DIAGNOSIS:    ICD-10-CM   1. Malignant neoplasm of upper-inner quadrant of right breast in female, estrogen receptor positive (HCC)  C50.211    Z17.0       SUMMARY OF ONCOLOGIC HISTORY: Oncology History  Malignant neoplasm of upper-inner quadrant of right breast in female, estrogen receptor positive (HCC)  04/08/2021 Initial Diagnosis   Palpable right breast mass: Mammogram and US showed a 4.5cm right breast mass at the 12-1 o'clock position with associated microcalcifications, multiple additional masses from the 9-11 o'clock position in the right breast, calcifications in the subareolar right breast, 1.4cm, and two enlarged right axillary lymph nodes. 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 Two variants of uncertain significance (VUS) were detected - one in the APC gene called p.T2422I (c.7265C>T) and a second in the BAP1 gene called p.P302L (c.905C>T). The report dates are 04/19/2021 and 04/25/2021, respectively.  The BRCAplus panel offered by [employer] and includes sequencing and deletion/duplication analysis for the following 8 genes: ATM, BRCA1, BRCA2, CDH1, CHEK2, PALB2, PTEN, and TP53. The CancerNext-Expanded + RNAinsight gene panel offered by [employer] and includes sequencing and  rearrangement analysis for the following 77 genes: AIP, ALK, APC, ATM, AXIN2, BAP1, BARD1, BLM, BMPR1A, BRCA1, BRCA2, BRIP1, CDC73, CDH1, CDK4, CDKN1B, CDKN2A, CHEK2, CTNNA1, DICER1, FANCC, FH, FLCN, GALNT12, KIF1B, LZTR1, MAX, MEN1, MET, MLH1, MSH2, MSH3, MSH6, MUTYH, NBN, NF1, NF2, NTHL1, PALB2, PHOX2B, PMS2, POT1, PRKAR1A, PTCH1, PTEN, RAD51C, RAD51D, RB1, RECQL, RET, SDHA, SDHAF2, SDHB, SDHC, SDHD, SMAD4, SMARCA4, SMARCB1, SMARCE1, STK11, SUFU, TMEM127, TP53, TSC1, TSC2, VHL and XRCC2 (sequencing and deletion/duplication); EGFR, EGLN1, HOXB13, KIT, MITF, PDGFRA, POLD1 and POLE (sequencing only); EPCAM and GREM1 (deletion/duplication only). RNA data is routinely analyzed for use in variant interpretation for all genes.    05/10/2021 -  Chemotherapy    Patient is on Treatment Plan: BREAST ADJUVANT DOSE DENSE AC Q14D / PACLITAXEL Q7D         CHIEF COMPLIANT: Cycle 6 Taxol  INTERVAL HISTORY: Chelsea Wise is a 40 y.o. with above-mentioned history of right breast cancer having completed neoadjuvant chemotherapy with dose dense Adriamycin and Cytoxan, currently on chemotherapy with Taxol. She reports to the clinic today for Cycle 6.  Denies any nausea vomiting.  Does have fatigue.  ALLERGIES:  has No Known Allergies.  MEDICATIONS:  Current Outpatient Medications  Medication Sig Dispense Refill   acetaminophen (TYLENOL) 500 MG tablet Take 500-1,000 mg by mouth every 6 (six) hours as needed for moderate pain.     Ascorbic Acid (VITAMIN C PO) Take 1 tablet by mouth daily.     cetirizine (ZYRTEC) 10 MG tablet Take 10 mg by mouth daily as needed for allergies.     ELDERBERRY PO Take 1 capsule by mouth daily.     hydrocortisone cream 1 % Apply  1 application topically 2 (two) times daily as needed for itching.     lidocaine-prilocaine (EMLA) cream Apply to affected area once 30 g 3   Multiple Vitamin (MULTIVITAMIN WITH MINERALS) TABS tablet Take 2 tablets by mouth daily.     Multiple  Vitamins-Minerals (ZINC PO) Take 1 tablet by mouth once a week.     nystatin (MYCOSTATIN) 100000 UNIT/ML suspension TAKE 5 MLS (500,000 UNITS TOTAL) BY MOUTH IN THE MORNING AND AT BEDTIME. 60 mL 0   ondansetron (ZOFRAN) 8 MG tablet Take 1 tablet (8 mg total) by mouth 2 (two) times daily as needed. Start on the third day after chemotherapy. 30 tablet 1   prochlorperazine (COMPAZINE) 10 MG tablet Take 1 tablet (10 mg total) by mouth every 6 (six) hours as needed (Nausea or vomiting). 30 tablet 1   sodium chloride (OCEAN) 0.65 % SOLN nasal spray Place 1 spray into both nostrils as needed for congestion.     No current facility-administered medications for this visit.    PHYSICAL EXAMINATION: ECOG PERFORMANCE STATUS: 1 - Symptomatic but completely ambulatory  Vitals:   08/09/21 1331  BP: 131/82  Pulse: 78  Resp: 18  Temp: 97.8 F (36.6 C)  SpO2: 100%   Filed Weights   08/09/21 1331  Weight: 134 lb 1.6 oz (60.8 kg)    LABORATORY DATA:  I have reviewed the data as listed CMP Latest Ref Rng & Units 08/02/2021 07/26/2021 07/19/2021  Glucose 70 - 99 mg/dL 94 86 91  BUN 6 - 20 mg/dL $Remove'9 10 11  'ZqhUamr$ Creatinine 0.44 - 1.00 mg/dL 0.67 0.69 0.64  Sodium 135 - 145 mmol/L 140 141 139  Potassium 3.5 - 5.1 mmol/L 3.7 3.8 3.8  Chloride 98 - 111 mmol/L 106 107 106  CO2 22 - 32 mmol/L $RemoveB'26 26 26  'EbvEwxZw$ Calcium 8.9 - 10.3 mg/dL 9.2 9.3 9.2  Total Protein 6.5 - 8.1 g/dL 6.9 6.9 6.9  Total Bilirubin 0.3 - 1.2 mg/dL 0.3 0.3 0.2(L)  Alkaline Phos 38 - 126 U/L 49 47 48  AST 15 - 41 U/L $Remo'18 18 19  'PFkfq$ ALT 0 - 44 U/L $Remo'17 19 25    'rJQVI$ Lab Results  Component Value Date   WBC 3.9 (L) 08/09/2021   HGB 9.9 (L) 08/09/2021   HCT 29.1 (L) 08/09/2021   MCV 95.4 08/09/2021   PLT 250 08/09/2021   NEUTROABS 2.5 08/09/2021    ASSESSMENT & PLAN:  Malignant neoplasm of upper-inner quadrant of right breast in female, estrogen receptor positive ([place]) 04/08/2021:Palpable right breast mass: Mammogram and US showed a 4.5cm right breast  mass at the 12-1 o'clock position with associated microcalcifications, multiple additional masses from the 9-11 o'clock position in the right breast, calcifications in the subareolar right breast, 1.4cm, and two enlarged right axillary lymph nodes. 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  Neoadjuvant chemotherapy with dose dense Adriamycin and Cytoxan x4 followed by Taxol weekly x12 2. mastectomy with targeted node dissection 3.  Adjuvant radiation 4.  Followed by adjuvant antiestrogen therapy -------------------------------------------------------------------------------------------------------------------- Current treatment: completed 4 cycles of dose dense Adriamycin Cytoxan, today is cycle 6 Taxol   Chemo toxicities:  Mild fatigue Tongue discoloration Chemo induced anemia: Hemoglobin today is 9.9: Monitoring  Leukopenia: Monitoring closely   Monitoring closely for neuropathy.       Return to clinic weekly for chemo every other week for follow-up    No orders of the defined types were placed in this encounter.  The patient has a good understanding of the overall plan. she agrees with it. she will call with any problems that may develop before the next visit here.  Total time spent: 30 mins including face to face time and time spent for planning, charting and coordination of care  Rulon Eisenmenger, MD, MPH 08/09/2021  I, Thana Ates, am acting as scribe for Dr. Nicholas Lose.  I have reviewed the above documentation for accuracy and completeness, and I agree with the above.

## 2021-08-09 ENCOUNTER — Encounter: Payer: Self-pay | Admitting: *Deleted

## 2021-08-09 ENCOUNTER — Inpatient Hospital Stay: Payer: Federal, State, Local not specified - PPO | Attending: Hematology and Oncology

## 2021-08-09 ENCOUNTER — Other Ambulatory Visit: Payer: Self-pay

## 2021-08-09 ENCOUNTER — Inpatient Hospital Stay (HOSPITAL_BASED_OUTPATIENT_CLINIC_OR_DEPARTMENT_OTHER): Payer: Federal, State, Local not specified - PPO | Admitting: Hematology and Oncology

## 2021-08-09 ENCOUNTER — Other Ambulatory Visit: Payer: Self-pay | Admitting: *Deleted

## 2021-08-09 ENCOUNTER — Inpatient Hospital Stay: Payer: Federal, State, Local not specified - PPO

## 2021-08-09 DIAGNOSIS — Z5111 Encounter for antineoplastic chemotherapy: Secondary | ICD-10-CM | POA: Insufficient documentation

## 2021-08-09 DIAGNOSIS — Z79899 Other long term (current) drug therapy: Secondary | ICD-10-CM | POA: Insufficient documentation

## 2021-08-09 DIAGNOSIS — C50211 Malignant neoplasm of upper-inner quadrant of right female breast: Secondary | ICD-10-CM

## 2021-08-09 DIAGNOSIS — D72819 Decreased white blood cell count, unspecified: Secondary | ICD-10-CM | POA: Insufficient documentation

## 2021-08-09 DIAGNOSIS — Z170 Estrogen receptor positive status [ER+]: Secondary | ICD-10-CM

## 2021-08-09 DIAGNOSIS — C773 Secondary and unspecified malignant neoplasm of axilla and upper limb lymph nodes: Secondary | ICD-10-CM | POA: Insufficient documentation

## 2021-08-09 DIAGNOSIS — D6481 Anemia due to antineoplastic chemotherapy: Secondary | ICD-10-CM | POA: Insufficient documentation

## 2021-08-09 DIAGNOSIS — T451X5A Adverse effect of antineoplastic and immunosuppressive drugs, initial encounter: Secondary | ICD-10-CM | POA: Diagnosis not present

## 2021-08-09 DIAGNOSIS — Z95828 Presence of other vascular implants and grafts: Secondary | ICD-10-CM

## 2021-08-09 LAB — CBC WITH DIFFERENTIAL/PLATELET
Abs Immature Granulocytes: 0.02 10*3/uL (ref 0.00–0.07)
Basophils Absolute: 0 10*3/uL (ref 0.0–0.1)
Basophils Relative: 1 %
Eosinophils Absolute: 0.1 10*3/uL (ref 0.0–0.5)
Eosinophils Relative: 1 %
HCT: 29.1 % — ABNORMAL LOW (ref 36.0–46.0)
Hemoglobin: 9.9 g/dL — ABNORMAL LOW (ref 12.0–15.0)
Immature Granulocytes: 1 %
Lymphocytes Relative: 25 %
Lymphs Abs: 1 10*3/uL (ref 0.7–4.0)
MCH: 32.5 pg (ref 26.0–34.0)
MCHC: 34 g/dL (ref 30.0–36.0)
MCV: 95.4 fL (ref 80.0–100.0)
Monocytes Absolute: 0.4 10*3/uL (ref 0.1–1.0)
Monocytes Relative: 10 %
Neutro Abs: 2.5 10*3/uL (ref 1.7–7.7)
Neutrophils Relative %: 62 %
Platelets: 250 10*3/uL (ref 150–400)
RBC: 3.05 MIL/uL — ABNORMAL LOW (ref 3.87–5.11)
RDW: 16.1 % — ABNORMAL HIGH (ref 11.5–15.5)
WBC: 3.9 10*3/uL — ABNORMAL LOW (ref 4.0–10.5)
nRBC: 0 % (ref 0.0–0.2)

## 2021-08-09 LAB — CMP (CANCER CENTER ONLY)
ALT: 17 U/L (ref 0–44)
AST: 19 U/L (ref 15–41)
Albumin: 4.1 g/dL (ref 3.5–5.0)
Alkaline Phosphatase: 46 U/L (ref 38–126)
Anion gap: 9 (ref 5–15)
BUN: 13 mg/dL (ref 6–20)
CO2: 24 mmol/L (ref 22–32)
Calcium: 9.2 mg/dL (ref 8.9–10.3)
Chloride: 107 mmol/L (ref 98–111)
Creatinine: 0.72 mg/dL (ref 0.44–1.00)
GFR, Estimated: >60 mL/min (ref >60)
Glucose, Bld: 87 mg/dL (ref 70–99)
Potassium: 3.9 mmol/L (ref 3.5–5.1)
Sodium: 140 mmol/L (ref 135–145)
Total Bilirubin: 0.2 mg/dL — ABNORMAL LOW (ref 0.3–1.2)
Total Protein: 7 g/dL (ref 6.5–8.1)

## 2021-08-09 LAB — PREGNANCY, URINE: Preg Test, Ur: NEGATIVE

## 2021-08-09 MED ORDER — SODIUM CHLORIDE 0.9 % IV SOLN
10.0000 mg | Freq: Once | INTRAVENOUS | Status: AC
  Administered 2021-08-09: 10 mg via INTRAVENOUS
  Filled 2021-08-09: qty 10

## 2021-08-09 MED ORDER — FAMOTIDINE 20 MG IN NS 100 ML IVPB
INTRAVENOUS | Status: AC
  Filled 2021-08-09: qty 100

## 2021-08-09 MED ORDER — SODIUM CHLORIDE 0.9% FLUSH
10.0000 mL | Freq: Once | INTRAVENOUS | Status: AC
  Administered 2021-08-09: 10 mL

## 2021-08-09 MED ORDER — DIPHENHYDRAMINE HCL 50 MG/ML IJ SOLN
25.0000 mg | Freq: Once | INTRAMUSCULAR | Status: AC
  Administered 2021-08-09: 25 mg via INTRAVENOUS

## 2021-08-09 MED ORDER — DIPHENHYDRAMINE HCL 50 MG/ML IJ SOLN
INTRAMUSCULAR | Status: AC
  Filled 2021-08-09: qty 1

## 2021-08-09 MED ORDER — ONDANSETRON HCL 4 MG/2ML IJ SOLN
INTRAMUSCULAR | Status: AC
  Filled 2021-08-09: qty 4

## 2021-08-09 MED ORDER — SODIUM CHLORIDE 0.9 % IV SOLN
80.0000 mg/m2 | Freq: Once | INTRAVENOUS | Status: AC
  Administered 2021-08-09: 132 mg via INTRAVENOUS
  Filled 2021-08-09: qty 22

## 2021-08-09 MED ORDER — HEPARIN SOD (PORK) LOCK FLUSH 100 UNIT/ML IV SOLN
500.0000 [IU] | Freq: Once | INTRAVENOUS | Status: AC | PRN
  Administered 2021-08-09: 500 [IU]

## 2021-08-09 MED ORDER — FAMOTIDINE 20 MG IN NS 100 ML IVPB
20.0000 mg | Freq: Once | INTRAVENOUS | Status: AC
  Administered 2021-08-09: 20 mg via INTRAVENOUS

## 2021-08-09 MED ORDER — ONDANSETRON HCL 4 MG/2ML IJ SOLN
8.0000 mg | Freq: Once | INTRAMUSCULAR | Status: AC
  Administered 2021-08-09: 8 mg via INTRAVENOUS

## 2021-08-09 MED ORDER — SODIUM CHLORIDE 0.9 % IV SOLN: Freq: Once | INTRAVENOUS | Status: AC

## 2021-08-09 MED ORDER — SODIUM CHLORIDE 0.9% FLUSH
10.0000 mL | INTRAVENOUS | Status: DC | PRN
Start: 2021-08-09 — End: 2021-08-09
  Administered 2021-08-09: 10 mL

## 2021-08-09 NOTE — Progress Notes (Signed)
Patient Care Team: Patient, [name] (Inactive) as PCP - General (General Practice) [name], RN as Oncology Nurse Navigator [name], RN as Oncology Nurse Navigator  DIAGNOSIS:    ICD-10-CM   1. Malignant neoplasm of upper-inner quadrant of right breast in female, estrogen receptor positive ([place])  C50.211    Z17.0       SUMMARY OF ONCOLOGIC HISTORY: Oncology History  Malignant neoplasm of upper-inner quadrant of right breast in female, estrogen receptor positive ([place])  04/08/2021 Initial Diagnosis   Palpable right breast mass: Mammogram and US showed a 4.5cm right breast mass at the 12-1 o'clock position with associated microcalcifications, multiple additional masses from the 9-11 o'clock position in the right breast, calcifications in the subareolar right breast, 1.4cm, and two enlarged right axillary lymph nodes. 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 Two variants of uncertain significance (VUS) were detected - one in the APC gene called p.T2422I (c.7265C>T) and a second in the BAP1 gene called p.P302L (c.905C>T). The report dates are 04/19/2021 and 04/25/2021, respectively.  The BRCAplus panel offered by [employer] and includes sequencing and deletion/duplication analysis for the following 8 genes: ATM, BRCA1, BRCA2, CDH1, CHEK2, PALB2, PTEN, and TP53. The CancerNext-Expanded + RNAinsight gene panel offered by [employer] and includes sequencing and  rearrangement analysis for the following 77 genes: AIP, ALK, APC, ATM, AXIN2, BAP1, BARD1, BLM, BMPR1A, BRCA1, BRCA2, BRIP1, CDC73, CDH1, CDK4, CDKN1B, CDKN2A, CHEK2, CTNNA1, DICER1, FANCC, FH, FLCN, GALNT12, KIF1B, LZTR1, MAX, MEN1, MET, MLH1, MSH2, MSH3, MSH6, MUTYH, NBN, NF1, NF2, NTHL1, PALB2, PHOX2B, PMS2, POT1, PRKAR1A, PTCH1, PTEN, RAD51C, RAD51D, RB1, RECQL, RET, SDHA, SDHAF2, SDHB, SDHC, SDHD, SMAD4, SMARCA4, SMARCB1, SMARCE1, STK11, SUFU, TMEM127, TP53, TSC1, TSC2, VHL and XRCC2 (sequencing and deletion/duplication); EGFR, EGLN1, HOXB13, KIT, MITF, PDGFRA, POLD1 and POLE (sequencing only); EPCAM and GREM1 (deletion/duplication only). RNA data is routinely analyzed for use in variant interpretation for all genes.    05/10/2021 -  Chemotherapy    Patient is on Treatment Plan: BREAST ADJUVANT DOSE DENSE AC Q14D / PACLITAXEL Q7D         CHIEF COMPLIANT: Cycle 6 Taxol  INTERVAL HISTORY: Chelsea Wise is a 40 y.o. with above-mentioned history of right breast cancer having completed neoadjuvant chemotherapy with dose dense Adriamycin and Cytoxan, currently on chemotherapy with Taxol. She reports to the clinic today for Cycle 6.  She reports no new problems or concerns.  She has not had any worsening neuropathy.  She is using ice therapy.  ALLERGIES:  has No Known Allergies.  MEDICATIONS:  Current Outpatient Medications  Medication Sig Dispense Refill   acetaminophen (TYLENOL) 500 MG tablet Take 500-1,000 mg by mouth every 6 (six) hours as needed for moderate pain.     Ascorbic Acid (VITAMIN C PO) Take 1 tablet by mouth daily.     cetirizine (ZYRTEC) 10 MG tablet Take 10 mg by mouth daily as needed for allergies.     ELDERBERRY PO Take 1  capsule by mouth daily.     hydrocortisone cream 1 % Apply 1 application topically 2 (two) times daily as needed for itching.     lidocaine-prilocaine (EMLA) cream Apply to affected area once 30 g 3   Multiple Vitamin (MULTIVITAMIN WITH MINERALS) TABS  tablet Take 2 tablets by mouth daily.     Multiple Vitamins-Minerals (ZINC PO) Take 1 tablet by mouth once a week.     nystatin (MYCOSTATIN) 100000 UNIT/ML suspension TAKE 5 MLS (500,000 UNITS TOTAL) BY MOUTH IN THE MORNING AND AT BEDTIME. 60 mL 0   ondansetron (ZOFRAN) 8 MG tablet Take 1 tablet (8 mg total) by mouth 2 (two) times daily as needed. Start on the third day after chemotherapy. 30 tablet 1   prochlorperazine (COMPAZINE) 10 MG tablet Take 1 tablet (10 mg total) by mouth every 6 (six) hours as needed (Nausea or vomiting). 30 tablet 1   sodium chloride (OCEAN) 0.65 % SOLN nasal spray Place 1 spray into both nostrils as needed for congestion.     No current facility-administered medications for this visit.    PHYSICAL EXAMINATION: ECOG PERFORMANCE STATUS: 1 - Symptomatic but completely ambulatory  Vitals:   08/09/21 1331  BP: 131/82  Pulse: 78  Resp: 18  Temp: 97.8 F (36.6 C)  SpO2: 100%   Filed Weights   08/09/21 1331  Weight: 134 lb 1.6 oz (60.8 kg)    LABORATORY DATA:  I have reviewed the data as listed CMP Latest Ref Rng & Units 08/02/2021 07/26/2021 07/19/2021  Glucose 70 - 99 mg/dL 94 86 91  BUN 6 - 20 mg/dL $Remove'9 10 11  'QHXKrsb$ Creatinine 0.44 - 1.00 mg/dL 0.67 0.69 0.64  Sodium 135 - 145 mmol/L 140 141 139  Potassium 3.5 - 5.1 mmol/L 3.7 3.8 3.8  Chloride 98 - 111 mmol/L 106 107 106  CO2 22 - 32 mmol/L $RemoveB'26 26 26  'SmvjSIsV$ Calcium 8.9 - 10.3 mg/dL 9.2 9.3 9.2  Total Protein 6.5 - 8.1 g/dL 6.9 6.9 6.9  Total Bilirubin 0.3 - 1.2 mg/dL 0.3 0.3 0.2(L)  Alkaline Phos 38 - 126 U/L 49 47 48  AST 15 - 41 U/L $Remo'18 18 19  'AGUyW$ ALT 0 - 44 U/L $Remo'17 19 25    'qTdhy$ Lab Results  Component Value Date   WBC 3.9 (L) 08/09/2021   HGB 9.9 (L) 08/09/2021   HCT 29.1 (L) 08/09/2021   MCV 95.4 08/09/2021   PLT 250 08/09/2021   NEUTROABS 2.5 08/09/2021    ASSESSMENT & PLAN:  Malignant neoplasm of upper-inner quadrant of right breast in female, estrogen receptor positive ([place]) 04/08/2021:Palpable right breast  mass: Mammogram and US showed a 4.5cm right breast mass at the 12-1 o'clock position with associated microcalcifications, multiple additional masses from the 9-11 o'clock position in the right breast, calcifications in the subareolar right breast, 1.4cm, and two enlarged right axillary lymph nodes. 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  Neoadjuvant chemotherapy with dose dense Adriamycin and Cytoxan x4 followed by Taxol weekly x12 2. mastectomy with targeted node dissection 3.  Adjuvant radiation 4.  Followed by adjuvant antiestrogen therapy -------------------------------------------------------------------------------------------------------------------- Current treatment: completed 4 cycles of dose dense Adriamycin Cytoxan, today is cycle 6 Taxol   Chemo toxicities:  Mild fatigue 2. Tongue discoloration 3. Chemo induced anemia: Hemoglobin today is 9.9: Monitoring 4.  Leukopenia: ANC today is 2.5.  We will keep the dose of the same.  I discussed with the patient extensively that if the labs come down then we may have to reduce the dosage or hold treatment if necessary.    Monitoring closely for neuropathy.      Denies any nausea or vomiting. Blood counts have been reviewed.   Return to clinic weekly for chemo every other week for follow-up    No orders of the defined types were placed in this encounter.  The patient has a good understanding of the overall plan. she agrees with it. she will call with any problems that may develop before the next visit here.  Total time spent: 30 mins including face to face time and time spent for planning, charting and coordination of care  Rulon Eisenmenger, MD, MPH 08/09/2021  I,  Thana Ates, am acting as scribe for Dr. Nicholas Lose.  I have reviewed the above documentation for accuracy and completeness, and I agree with the above.

## 2021-08-09 NOTE — Patient Instructions (Signed)
[facility] ONCOLOGY  Discharge Instructions: Thank you for choosing [facility] to provide your oncology and hematology care.   If you have a lab appointment with the [facility], please go directly to the [facility] and check in at the registration area.   Wear comfortable clothing and clothing appropriate for easy access to any Portacath or PICC line.   We strive to give you quality time with your provider. You may need to reschedule your appointment if you arrive late (15 or more minutes).  Arriving late affects you and other patients whose appointments are after yours.  Also, if you miss three or more appointments without notifying the office, you may be dismissed from the clinic at the provider's discretion.      For prescription refill requests, have your pharmacy contact our office and allow 72 hours for refills to be completed.    Today you received the following chemotherapy and/or immunotherapy agents Paclitaxel      To help prevent nausea and vomiting after your treatment, we encourage you to take your nausea medication as directed.  BELOW ARE SYMPTOMS THAT SHOULD BE REPORTED IMMEDIATELY: *FEVER GREATER THAN 100.4 F (38 C) OR HIGHER *CHILLS OR SWEATING *NAUSEA AND VOMITING THAT IS NOT CONTROLLED WITH YOUR NAUSEA MEDICATION *UNUSUAL SHORTNESS OF BREATH *UNUSUAL BRUISING OR BLEEDING *URINARY PROBLEMS (pain or burning when urinating, or frequent urination) *BOWEL PROBLEMS (unusual diarrhea, constipation, pain near the anus) TENDERNESS IN MOUTH AND THROAT WITH OR WITHOUT PRESENCE OF ULCERS (sore throat, sores in mouth, or a toothache) UNUSUAL RASH, SWELLING OR PAIN  UNUSUAL VAGINAL DISCHARGE OR ITCHING   Items with * indicate a potential emergency and should be followed up as soon as possible or go to the Emergency Department if any problems should occur.  Please show the CHEMOTHERAPY ALERT CARD or IMMUNOTHERAPY ALERT CARD at check-in to  the Emergency Department and triage nurse.  Should you have questions after your visit or need to cancel or reschedule your appointment, please contact [facility]  Dept: [phone]  and follow the prompts.  Office hours are 8:00 a.m. to 4:30 p.m. Monday - Friday. Please note that voicemails left after 4:00 p.m. may not be returned until the following business day.  We are closed weekends and major holidays. You have access to a nurse at all times for urgent questions. Please call the main number to the clinic Dept: [phone] and follow the prompts.   For any non-urgent questions, you may also contact your provider using MyChart. We now offer e-Visits for anyone 59 and older to request care online for non-urgent symptoms. For details visit [email or web address].   Also download the MyChart app! Go to the app store, search "MyChart", open the app, select [facility] [place], and log in with your MyChart username and password.  Due to Covid, a mask is required upon entering the hospital/clinic. If you do not have a mask, one will be given to you upon arrival. For doctor visits, patients may have 1 support person aged 12 or older with them. For treatment visits, patients cannot have anyone with them due to current Covid guidelines and our immunocompromised population.

## 2021-08-09 NOTE — Assessment & Plan Note (Signed)
04/08/2021:Palpable right breast mass: Mammogram and US showed a 4.5cm right breast mass at the 12-1 o'clock position with associated microcalcifications, multiple additional masses from the 9-11 o'clock position in the right breast, calcifications in the subareolar right breast, 1.4cm, and two enlarged right axillary lymph nodes. 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 Neoadjuvant chemotherapy with dose dense Adriamycin and Cytoxan x4 followed by Taxol weekly x12 2. mastectomy with targeted node dissection 3. Adjuvant radiation 4. Followed by adjuvant antiestrogen therapy -------------------------------------------------------------------------------------------------------------------- Current treatment:completed 4 cycles ofdose dense Adriamycin Cytoxan, today is cycle6Taxol  Chemo toxicities: Mild fatigue Tongue discoloration Chemo induced anemia: Hemoglobin today is 9.5: Monitoring Menstrual cycles have returned:   Monitoring closely for neuropathy.  She tells me that her hand falls asleep at nighttime but gets better when she moves it.  She has not noticed any difficulty with buttoning her shirt or writing and she has not had any troubles with walking or gait.  Denies any nausea or vomiting. Blood counts have been reviewed.  Return to clinic weekly for chemo every other week for follow-up

## 2021-08-11 ENCOUNTER — Telehealth: Payer: Self-pay | Admitting: Hematology and Oncology

## 2021-08-11 NOTE — Telephone Encounter (Signed)
Scheduled appts per 9/6 sch msg. Pt is aware.

## 2021-08-12 ENCOUNTER — Telehealth: Payer: Self-pay | Admitting: Dietician

## 2021-08-12 ENCOUNTER — Encounter: Payer: Federal, State, Local not specified - PPO | Admitting: Dietician

## 2021-08-12 NOTE — Telephone Encounter (Signed)
Nutrition Follow-up:  Patient has completed neoadjuvant Adriamycin and Cytoxan for breast cancer. She is currently receiving Taxol.  Spoke with patient via telephone. She reports tolerating treatment well. Patient has a good appetite most days and is eating 3 meals with snacks in between. Yesterday she had yogurt, [place] sandwich with tomato, rice, green beans, nuts/almonds and swiss cheese, 2 oatmeal raisin cookies, 1 Boost Protein. Patient does not drink oral nutrition supplement daily, reports alternating with Enterade supplement. She denies nausea, vomiting, diarrhea, constipation, reports bitter taste of some foods as well as dislike of salty foods. She is using baking soda, salt water rinses several times daily and biotene.   Medications: reviewed  Labs: reviewed  Anthropometrics: Last weight 134 lb 1.6 oz stable  8/23 - 133 lb 14.4 oz 8/9 - 133 lb 7/19 - 133 lb 1.6   NUTRITION DIAGNOSIS: Food and nutrition knowledge deficit ongoing    INTERVENTION:  Continue eating small frequent meals and snacks Reviewed foods with protein, encouraged protein source with meals/snacks Discussed strategies for altered taste - will mail handout with tips Educated on Enterade as medical food supplement to decrease chemotherapy side effects and not oral nutrition supplement Recommended daily intake of Boost Plus/equivalent for added calories and protein - will mail coupons Patient has contact information    MONITORING, EVALUATION, GOAL: weight trends, intake   NEXT VISIT: f/u telephone ~6 weeks

## 2021-08-15 MED FILL — Dexamethasone Sodium Phosphate Inj 100 MG/10ML: INTRAMUSCULAR | Qty: 1 | Status: AC

## 2021-08-16 ENCOUNTER — Inpatient Hospital Stay: Payer: Federal, State, Local not specified - PPO

## 2021-08-16 ENCOUNTER — Encounter: Payer: Self-pay | Admitting: *Deleted

## 2021-08-16 ENCOUNTER — Other Ambulatory Visit: Payer: Self-pay | Admitting: *Deleted

## 2021-08-16 ENCOUNTER — Other Ambulatory Visit: Payer: Self-pay

## 2021-08-16 VITALS — BP 131/87 | HR 80 | Temp 98.5°F | Resp 18 | Wt 135.5 lb

## 2021-08-16 DIAGNOSIS — C50211 Malignant neoplasm of upper-inner quadrant of right female breast: Secondary | ICD-10-CM

## 2021-08-16 DIAGNOSIS — Z170 Estrogen receptor positive status [ER+]: Secondary | ICD-10-CM

## 2021-08-16 DIAGNOSIS — Z95828 Presence of other vascular implants and grafts: Secondary | ICD-10-CM

## 2021-08-16 LAB — COMPREHENSIVE METABOLIC PANEL
ALT: 13 U/L (ref 0–44)
AST: 16 U/L (ref 15–41)
Albumin: 3.8 g/dL (ref 3.5–5.0)
Alkaline Phosphatase: 48 U/L (ref 38–126)
Anion gap: 10 (ref 5–15)
BUN: 12 mg/dL (ref 6–20)
CO2: 25 mmol/L (ref 22–32)
Calcium: 9.6 mg/dL (ref 8.9–10.3)
Chloride: 104 mmol/L (ref 98–111)
Creatinine, Ser: 0.67 mg/dL (ref 0.44–1.00)
GFR, Estimated: >60 mL/min (ref >60)
Glucose, Bld: 85 mg/dL (ref 70–99)
Potassium: 3.5 mmol/L (ref 3.5–5.1)
Sodium: 139 mmol/L (ref 135–145)
Total Bilirubin: 0.4 mg/dL (ref 0.3–1.2)
Total Protein: 7 g/dL (ref 6.5–8.1)

## 2021-08-16 LAB — CBC WITH DIFFERENTIAL/PLATELET
Abs Immature Granulocytes: 0.04 10*3/uL (ref 0.00–0.07)
Basophils Absolute: 0 10*3/uL (ref 0.0–0.1)
Basophils Relative: 0 %
Eosinophils Absolute: 0 10*3/uL (ref 0.0–0.5)
Eosinophils Relative: 0 %
HCT: 30 % — ABNORMAL LOW (ref 36.0–46.0)
Hemoglobin: 10.3 g/dL — ABNORMAL LOW (ref 12.0–15.0)
Immature Granulocytes: 1 %
Lymphocytes Relative: 9 %
Lymphs Abs: 0.7 10*3/uL (ref 0.7–4.0)
MCH: 32.5 pg (ref 26.0–34.0)
MCHC: 34.3 g/dL (ref 30.0–36.0)
MCV: 94.6 fL (ref 80.0–100.0)
Monocytes Absolute: 0.5 10*3/uL (ref 0.1–1.0)
Monocytes Relative: 7 %
Neutro Abs: 5.7 10*3/uL (ref 1.7–7.7)
Neutrophils Relative %: 83 %
Platelets: 279 10*3/uL (ref 150–400)
RBC: 3.17 MIL/uL — ABNORMAL LOW (ref 3.87–5.11)
RDW: 15.3 % (ref 11.5–15.5)
WBC: 7 10*3/uL (ref 4.0–10.5)
nRBC: 0 % (ref 0.0–0.2)

## 2021-08-16 LAB — PREGNANCY, URINE: Preg Test, Ur: NEGATIVE

## 2021-08-16 MED ORDER — SODIUM CHLORIDE 0.9 % IV SOLN
80.0000 mg/m2 | Freq: Once | INTRAVENOUS | Status: AC
  Administered 2021-08-16: 132 mg via INTRAVENOUS
  Filled 2021-08-16: qty 22

## 2021-08-16 MED ORDER — HEPARIN SOD (PORK) LOCK FLUSH 100 UNIT/ML IV SOLN
500.0000 [IU] | Freq: Once | INTRAVENOUS | Status: AC | PRN
  Administered 2021-08-16: 500 [IU]

## 2021-08-16 MED ORDER — SODIUM CHLORIDE 0.9 % IV SOLN: Freq: Once | INTRAVENOUS | Status: AC

## 2021-08-16 MED ORDER — SODIUM CHLORIDE 0.9% FLUSH
10.0000 mL | INTRAVENOUS | Status: DC | PRN
  Administered 2021-08-16: 10 mL

## 2021-08-16 MED ORDER — FAMOTIDINE 20 MG IN NS 100 ML IVPB: 20.0000 mg | Freq: Once | INTRAVENOUS | Status: AC

## 2021-08-16 MED ORDER — SODIUM CHLORIDE 0.9% FLUSH
10.0000 mL | Freq: Once | INTRAVENOUS | Status: AC
  Administered 2021-08-16: 10 mL

## 2021-08-16 MED ORDER — ONDANSETRON HCL 4 MG/2ML IJ SOLN
8.0000 mg | Freq: Once | INTRAMUSCULAR | Status: AC
  Administered 2021-08-16: 8 mg via INTRAVENOUS
  Filled 2021-08-16: qty 4

## 2021-08-16 MED ORDER — FAMOTIDINE 20 MG IN NS 100 ML IVPB
INTRAVENOUS | Status: AC
  Administered 2021-08-16: 20 mg via INTRAVENOUS
  Filled 2021-08-16: qty 100

## 2021-08-16 MED ORDER — SODIUM CHLORIDE 0.9 % IV SOLN
10.0000 mg | Freq: Once | INTRAVENOUS | Status: AC
  Administered 2021-08-16: 10 mg via INTRAVENOUS
  Filled 2021-08-16: qty 10

## 2021-08-16 MED ORDER — DIPHENHYDRAMINE HCL 50 MG/ML IJ SOLN
25.0000 mg | Freq: Once | INTRAMUSCULAR | Status: AC
  Administered 2021-08-16: 25 mg via INTRAVENOUS
  Filled 2021-08-16: qty 1

## 2021-08-16 NOTE — Patient Instructions (Signed)
[facility]  Discharge Instructions: Thank you for choosing [facility] to provide your oncology and hematology care.   If you have a lab appointment with the Cancer Center, please go directly to the Cancer Center and check in at the registration area.   Wear comfortable clothing and clothing appropriate for easy access to any Portacath or PICC line.   We strive to give you quality time with your provider. You may need to reschedule your appointment if you arrive late (15 or more minutes).  Arriving late affects you and other patients whose appointments are after yours.  Also, if you miss three or more appointments without notifying the office, you may be dismissed from the clinic at the provider's discretion.      For prescription refill requests, have your pharmacy contact our office and allow 72 hours for refills to be completed.    Today you received the following chemotherapy and/or immunotherapy agents taxol      To help prevent nausea and vomiting after your treatment, we encourage you to take your nausea medication as directed.  BELOW ARE SYMPTOMS THAT SHOULD BE REPORTED IMMEDIATELY: *FEVER GREATER THAN 100.4 F (38 C) OR HIGHER *CHILLS OR SWEATING *NAUSEA AND VOMITING THAT IS NOT CONTROLLED WITH YOUR NAUSEA MEDICATION *UNUSUAL SHORTNESS OF BREATH *UNUSUAL BRUISING OR BLEEDING *URINARY PROBLEMS (pain or burning when urinating, or frequent urination) *BOWEL PROBLEMS (unusual diarrhea, constipation, pain near the anus) TENDERNESS IN MOUTH AND THROAT WITH OR WITHOUT PRESENCE OF ULCERS (sore throat, sores in mouth, or a toothache) UNUSUAL RASH, SWELLING OR PAIN  UNUSUAL VAGINAL DISCHARGE OR ITCHING   Items with * indicate a potential emergency and should be followed up as soon as possible or go to the Emergency Department if any problems should occur.  Please show the CHEMOTHERAPY ALERT CARD or IMMUNOTHERAPY ALERT CARD at check-in to the  Emergency Department and triage nurse.  Should you have questions after your visit or need to cancel or reschedule your appointment, please contact [facility]  Dept: [phone]  and follow the prompts.  Office hours are 8:00 a.m. to 4:30 p.m. Monday - Friday. Please note that voicemails left after 4:00 p.m. may not be returned until the following business day.  We are closed weekends and major holidays. You have access to a nurse at all times for urgent questions. Please call the main number to the clinic Dept: [phone] and follow the prompts.   For any non-urgent questions, you may also contact your provider using MyChart. We now offer e-Visits for anyone 18 and older to request care online for non-urgent symptoms. For details visit [email or web address].   Also download the MyChart app! Go to the app store, search "MyChart", open the app, select , and log in with your MyChart username and password.  Due to Covid, a mask is required upon entering the hospital/clinic. If you do not have a mask, one will be given to you upon arrival. For doctor visits, patients may have 1 support person aged 18 or older with them. For treatment visits, patients cannot have anyone with them due to current Covid guidelines and our immunocompromised population.   

## 2021-08-21 ENCOUNTER — Encounter: Payer: Self-pay | Admitting: Hematology and Oncology

## 2021-08-22 MED FILL — Dexamethasone Sodium Phosphate Inj 100 MG/10ML: INTRAMUSCULAR | Qty: 1 | Status: AC

## 2021-08-22 NOTE — Progress Notes (Signed)
Patient Care Team: Patient, [name] (Inactive) as PCP - General (General Practice) Pershing Proud, RN as Oncology Nurse Navigator Donnelly Angelica, RN as Oncology Nurse Navigator  DIAGNOSIS:    ICD-10-CM   1. Malignant neoplasm of upper-inner quadrant of right breast in female, estrogen receptor positive (HCC)  C50.211    Z17.0       SUMMARY OF ONCOLOGIC HISTORY: Oncology History  Malignant neoplasm of upper-inner quadrant of right breast in female, estrogen receptor positive (HCC)  04/08/2021 Initial Diagnosis   Palpable right breast mass: Mammogram and US showed a 4.5cm right breast mass at the 12-1 o'clock position with associated microcalcifications, multiple additional masses from the 9-11 o'clock position in the right breast, calcifications in the subareolar right breast, 1.4cm, and two enlarged right axillary lymph nodes. 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 Two variants of uncertain significance (VUS) were detected - one in the APC gene called p.T2422I (c.7265C>T) and a second in the BAP1 gene called p.P302L (c.905C>T). The report dates are 04/19/2021 and 04/25/2021, respectively.  The BRCAplus panel offered by [employer] and includes sequencing and deletion/duplication analysis for the following 8 genes: ATM, BRCA1, BRCA2, CDH1, CHEK2, PALB2, PTEN, and TP53. The CancerNext-Expanded + RNAinsight gene panel offered by [employer] and includes sequencing and  rearrangement analysis for the following 77 genes: AIP, ALK, APC, ATM, AXIN2, BAP1, BARD1, BLM, BMPR1A, BRCA1, BRCA2, BRIP1, CDC73, CDH1, CDK4, CDKN1B, CDKN2A, CHEK2, CTNNA1, DICER1, FANCC, FH, FLCN, GALNT12, KIF1B, LZTR1, MAX, MEN1, MET, MLH1, MSH2, MSH3, MSH6, MUTYH, NBN, NF1, NF2, NTHL1, PALB2, PHOX2B, PMS2, POT1, PRKAR1A, PTCH1, PTEN, RAD51C, RAD51D, RB1, RECQL, RET, SDHA, SDHAF2, SDHB, SDHC, SDHD, SMAD4, SMARCA4, SMARCB1, SMARCE1, STK11, SUFU, TMEM127, TP53, TSC1, TSC2, VHL and XRCC2 (sequencing and deletion/duplication); EGFR, EGLN1, HOXB13, KIT, MITF, PDGFRA, POLD1 and POLE (sequencing only); EPCAM and GREM1 (deletion/duplication only). RNA data is routinely analyzed for use in variant interpretation for all genes.    05/10/2021 -  Chemotherapy    Patient is on Treatment Plan: BREAST ADJUVANT DOSE DENSE AC Q14D / PACLITAXEL Q7D         CHIEF COMPLIANT: Cycle 8 Taxol  INTERVAL HISTORY: Shawndell Varas is a 40 y.o. with above-mentioned history of right breast cancer having completed neoadjuvant chemotherapy with dose dense Adriamycin and Cytoxan, currently on chemotherapy with Taxol. She reports to the clinic today for Cycle 8.  Patient denies any peripheral neuropathy.  Does not have any nausea vomiting.  She does have mild fatigue.  ALLERGIES:  has No Known Allergies.  MEDICATIONS:  Current Outpatient Medications  Medication Sig Dispense Refill   acetaminophen (TYLENOL) 500 MG tablet Take 500-1,000 mg by mouth every 6 (six) hours as needed for moderate pain.     Ascorbic Acid (VITAMIN C PO) Take 1 tablet by mouth daily.     cetirizine (ZYRTEC) 10 MG tablet Take 10 mg by mouth daily as needed for allergies.     ELDERBERRY PO Take 1 capsule by mouth  daily.     hydrocortisone cream 1 % Apply 1 application topically 2 (two) times daily as needed for itching.     lidocaine-prilocaine (EMLA) cream Apply to affected area once 30 g 3   Multiple Vitamin (MULTIVITAMIN WITH MINERALS) TABS  tablet Take 2 tablets by mouth daily.     Multiple Vitamins-Minerals (ZINC PO) Take 1 tablet by mouth once a week.     nystatin (MYCOSTATIN) 100000 UNIT/ML suspension TAKE 5 MLS (500,000 UNITS TOTAL) BY MOUTH IN THE MORNING AND AT BEDTIME. 60 mL 0   ondansetron (ZOFRAN) 8 MG tablet Take 1 tablet (8 mg total) by mouth 2 (two) times daily as needed. Start on the third day after chemotherapy. 30 tablet 1   prochlorperazine (COMPAZINE) 10 MG tablet Take 1 tablet (10 mg total) by mouth every 6 (six) hours as needed (Nausea or vomiting). 30 tablet 1   sodium chloride (OCEAN) 0.65 % SOLN nasal spray Place 1 spray into both nostrils as needed for congestion.     No current facility-administered medications for this visit.   Facility-Administered Medications Ordered in Other Visits  Medication Dose Route Frequency Provider Last Rate Last Admin   sodium chloride flush (NS) 0.9 % injection 10 mL  10 mL Intracatheter PRN Nicholas Lose, MD   10 mL at 08/23/21 1426    PHYSICAL EXAMINATION: ECOG PERFORMANCE STATUS: 1 - Symptomatic but completely ambulatory  Vitals:   08/23/21 1116  BP: (!) 141/88  Pulse: 80  Resp: 18  Temp: 97.8 F (36.6 C)  SpO2: 100%   Filed Weights   08/23/21 1116  Weight: 135 lb 12.8 oz (61.6 kg)     LABORATORY DATA:  I have reviewed the data as listed CMP Latest Ref Rng & Units 08/23/2021 08/16/2021 08/09/2021  Glucose 70 - 99 mg/dL 98 85 87  BUN 6 - 20 mg/dL $Remove'9 12 13  'wFKKiNz$ Creatinine 0.44 - 1.00 mg/dL 0.66 0.67 0.72  Sodium 135 - 145 mmol/L 138 139 140  Potassium 3.5 - 5.1 mmol/L 3.7 3.5 3.9  Chloride 98 - 111 mmol/L 105 104 107  CO2 22 - 32 mmol/L $RemoveB'25 25 24  'BTXGQUCH$ Calcium 8.9 - 10.3 mg/dL 9.3 9.6 9.2  Total Protein 6.5 - 8.1 g/dL 7.0 7.0 7.0  Total Bilirubin 0.3 - 1.2 mg/dL 0.3 0.4 0.2(L)  Alkaline Phos 38 - 126 U/L 49 48 46  AST 15 - 41 U/L $Remo'15 16 19  'SSGul$ ALT 0 - 44 U/L $Remo'9 13 17    'PoOAH$ Lab Results  Component Value Date   WBC 5.0 08/23/2021   HGB 10.2 (L) 08/23/2021   HCT 30.4 (L)  08/23/2021   MCV 96.2 08/23/2021   PLT 257 08/23/2021   NEUTROABS 3.5 08/23/2021    ASSESSMENT & PLAN:  Malignant neoplasm of upper-inner quadrant of right breast in female, estrogen receptor positive ([place]) 04/08/2021:Palpable right breast mass: Mammogram and US showed a 4.5cm right breast mass at the 12-1 o'clock position with associated microcalcifications, multiple additional masses from the 9-11 o'clock position in the right breast, calcifications in the subareolar right breast, 1.4cm, and two enlarged right axillary lymph nodes. 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  Neoadjuvant chemotherapy with dose dense Adriamycin and Cytoxan x4  followed by Taxol weekly x12 2. mastectomy with targeted node dissection 3.  Adjuvant radiation 4.  Followed by adjuvant antiestrogen therapy -------------------------------------------------------------------------------------------------------------------- Current treatment: completed 4 cycles of dose dense Adriamycin Cytoxan, today is cycle 8 Taxol   Chemo toxicities:  Mild fatigue Tongue discoloration Chemo induced anemia: Hemoglobin today is 9.9: Monitoring Leukopenia: Monitoring closely   Monitoring closely for neuropathy.       Return to clinic weekly for chemo every other week for follow-up    No orders of the defined types were placed in this encounter.  The patient has a good understanding of the overall plan. she agrees with it. she will call with any problems that may develop before the next visit here.  Total time spent: 30 mins including face to face time and time spent for planning, charting and coordination of care  Rulon Eisenmenger, MD, MPH 08/23/2021  I, Thana Ates, am acting as scribe for Dr. Nicholas Lose.  I have reviewed the above documentation for accuracy and completeness, and I agree with the above.

## 2021-08-23 ENCOUNTER — Inpatient Hospital Stay: Payer: Federal, State, Local not specified - PPO

## 2021-08-23 ENCOUNTER — Other Ambulatory Visit: Payer: Self-pay

## 2021-08-23 ENCOUNTER — Inpatient Hospital Stay: Payer: Federal, State, Local not specified - PPO | Admitting: Hematology and Oncology

## 2021-08-23 DIAGNOSIS — Z170 Estrogen receptor positive status [ER+]: Secondary | ICD-10-CM

## 2021-08-23 DIAGNOSIS — C50211 Malignant neoplasm of upper-inner quadrant of right female breast: Secondary | ICD-10-CM | POA: Diagnosis not present

## 2021-08-23 DIAGNOSIS — Z95828 Presence of other vascular implants and grafts: Secondary | ICD-10-CM

## 2021-08-23 LAB — CMP (CANCER CENTER ONLY)
ALT: 9 U/L (ref 0–44)
AST: 15 U/L (ref 15–41)
Albumin: 4 g/dL (ref 3.5–5.0)
Alkaline Phosphatase: 49 U/L (ref 38–126)
Anion gap: 8 (ref 5–15)
BUN: 9 mg/dL (ref 6–20)
CO2: 25 mmol/L (ref 22–32)
Calcium: 9.3 mg/dL (ref 8.9–10.3)
Chloride: 105 mmol/L (ref 98–111)
Creatinine: 0.66 mg/dL (ref 0.44–1.00)
GFR, Estimated: >60 mL/min (ref >60)
Glucose, Bld: 98 mg/dL (ref 70–99)
Potassium: 3.7 mmol/L (ref 3.5–5.1)
Sodium: 138 mmol/L (ref 135–145)
Total Bilirubin: 0.3 mg/dL (ref 0.3–1.2)
Total Protein: 7 g/dL (ref 6.5–8.1)

## 2021-08-23 LAB — CBC WITH DIFFERENTIAL/PLATELET
Abs Immature Granulocytes: 0.03 10*3/uL (ref 0.00–0.07)
Basophils Absolute: 0 10*3/uL (ref 0.0–0.1)
Basophils Relative: 0 %
Eosinophils Absolute: 0.1 10*3/uL (ref 0.0–0.5)
Eosinophils Relative: 1 %
HCT: 30.4 % — ABNORMAL LOW (ref 36.0–46.0)
Hemoglobin: 10.2 g/dL — ABNORMAL LOW (ref 12.0–15.0)
Immature Granulocytes: 1 %
Lymphocytes Relative: 19 %
Lymphs Abs: 1 10*3/uL (ref 0.7–4.0)
MCH: 32.3 pg (ref 26.0–34.0)
MCHC: 33.6 g/dL (ref 30.0–36.0)
MCV: 96.2 fL (ref 80.0–100.0)
Monocytes Absolute: 0.4 10*3/uL (ref 0.1–1.0)
Monocytes Relative: 9 %
Neutro Abs: 3.5 10*3/uL (ref 1.7–7.7)
Neutrophils Relative %: 70 %
Platelets: 257 10*3/uL (ref 150–400)
RBC: 3.16 MIL/uL — ABNORMAL LOW (ref 3.87–5.11)
RDW: 14.5 % (ref 11.5–15.5)
WBC: 5 10*3/uL (ref 4.0–10.5)
nRBC: 0 % (ref 0.0–0.2)

## 2021-08-23 LAB — PREGNANCY, URINE: Preg Test, Ur: NEGATIVE

## 2021-08-23 MED ORDER — SODIUM CHLORIDE 0.9% FLUSH
10.0000 mL | Freq: Once | INTRAVENOUS | Status: AC
  Administered 2021-08-23: 10 mL

## 2021-08-23 MED ORDER — SODIUM CHLORIDE 0.9 % IV SOLN: Freq: Once | INTRAVENOUS | Status: AC

## 2021-08-23 MED ORDER — SODIUM CHLORIDE 0.9 % IV SOLN
80.0000 mg/m2 | Freq: Once | INTRAVENOUS | Status: AC
  Administered 2021-08-23: 132 mg via INTRAVENOUS
  Filled 2021-08-23: qty 22

## 2021-08-23 MED ORDER — ONDANSETRON HCL 4 MG/2ML IJ SOLN
8.0000 mg | Freq: Once | INTRAMUSCULAR | Status: AC
  Administered 2021-08-23: 8 mg via INTRAVENOUS
  Filled 2021-08-23: qty 4

## 2021-08-23 MED ORDER — DIPHENHYDRAMINE HCL 50 MG/ML IJ SOLN
25.0000 mg | Freq: Once | INTRAMUSCULAR | Status: AC
  Administered 2021-08-23: 25 mg via INTRAVENOUS
  Filled 2021-08-23: qty 1

## 2021-08-23 MED ORDER — SODIUM CHLORIDE 0.9% FLUSH
10.0000 mL | INTRAVENOUS | Status: DC | PRN
  Administered 2021-08-23: 10 mL

## 2021-08-23 MED ORDER — HEPARIN SOD (PORK) LOCK FLUSH 100 UNIT/ML IV SOLN
500.0000 [IU] | Freq: Once | INTRAVENOUS | Status: AC | PRN
  Administered 2021-08-23: 500 [IU]

## 2021-08-23 MED ORDER — SODIUM CHLORIDE 0.9 % IV SOLN
10.0000 mg | Freq: Once | INTRAVENOUS | Status: AC
  Administered 2021-08-23: 10 mg via INTRAVENOUS
  Filled 2021-08-23: qty 10

## 2021-08-23 MED ORDER — FAMOTIDINE 20 MG IN NS 100 ML IVPB
20.0000 mg | Freq: Once | INTRAVENOUS | Status: AC
  Administered 2021-08-23: 20 mg via INTRAVENOUS
  Filled 2021-08-23: qty 100

## 2021-08-23 NOTE — Patient Instructions (Signed)
[facility]  Discharge Instructions: Thank you for choosing [facility] to provide your oncology and hematology care.   If you have a lab appointment with the Cancer Center, please go directly to the Cancer Center and check in at the registration area.   Wear comfortable clothing and clothing appropriate for easy access to any Portacath or PICC line.   We strive to give you quality time with your provider. You may need to reschedule your appointment if you arrive late (15 or more minutes).  Arriving late affects you and other patients whose appointments are after yours.  Also, if you miss three or more appointments without notifying the office, you may be dismissed from the clinic at the provider's discretion.      For prescription refill requests, have your pharmacy contact our office and allow 72 hours for refills to be completed.    Today you received the following chemotherapy and/or immunotherapy agents taxol      To help prevent nausea and vomiting after your treatment, we encourage you to take your nausea medication as directed.  BELOW ARE SYMPTOMS THAT SHOULD BE REPORTED IMMEDIATELY: *FEVER GREATER THAN 100.4 F (38 C) OR HIGHER *CHILLS OR SWEATING *NAUSEA AND VOMITING THAT IS NOT CONTROLLED WITH YOUR NAUSEA MEDICATION *UNUSUAL SHORTNESS OF BREATH *UNUSUAL BRUISING OR BLEEDING *URINARY PROBLEMS (pain or burning when urinating, or frequent urination) *BOWEL PROBLEMS (unusual diarrhea, constipation, pain near the anus) TENDERNESS IN MOUTH AND THROAT WITH OR WITHOUT PRESENCE OF ULCERS (sore throat, sores in mouth, or a toothache) UNUSUAL RASH, SWELLING OR PAIN  UNUSUAL VAGINAL DISCHARGE OR ITCHING   Items with * indicate a potential emergency and should be followed up as soon as possible or go to the Emergency Department if any problems should occur.  Please show the CHEMOTHERAPY ALERT CARD or IMMUNOTHERAPY ALERT CARD at check-in to the  Emergency Department and triage nurse.  Should you have questions after your visit or need to cancel or reschedule your appointment, please contact [facility]  Dept: [phone]  and follow the prompts.  Office hours are 8:00 a.m. to 4:30 p.m. Monday - Friday. Please note that voicemails left after 4:00 p.m. may not be returned until the following business day.  We are closed weekends and major holidays. You have access to a nurse at all times for urgent questions. Please call the main number to the clinic Dept: [phone] and follow the prompts.   For any non-urgent questions, you may also contact your provider using MyChart. We now offer e-Visits for anyone 18 and older to request care online for non-urgent symptoms. For details visit [email or web address].   Also download the MyChart app! Go to the app store, search "MyChart", open the app, select [facility], and log in with your MyChart username and password.  Due to Covid, a mask is required upon entering the hospital/clinic. If you do not have a mask, one will be given to you upon arrival. For doctor visits, patients may have 1 support person aged 18 or older with them. For treatment visits, patients cannot have anyone with them due to current Covid guidelines and our immunocompromised population.   

## 2021-08-23 NOTE — Assessment & Plan Note (Signed)
04/08/2021:Palpable right breast mass: Mammogram and US showed a 4.5cm right breast mass at the 12-1 o'clock position with associated microcalcifications, multiple additional masses from the 9-11 o'clock position in the right breast, calcifications in the subareolar right breast, 1.4cm, and two enlarged right axillary lymph nodes. 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 Neoadjuvant chemotherapy with dose dense Adriamycin and Cytoxan x4 followed by Taxol weekly x12 2. mastectomy with targeted node dissection 3. Adjuvant radiation 4. Followed by adjuvant antiestrogen therapy -------------------------------------------------------------------------------------------------------------------- Current treatment:completed 4 cycles ofdose dense Adriamycin Cytoxan, today is cycle8Taxol  Chemo toxicities: Mild fatigue Tongue discoloration Chemo induced anemia: Hemoglobin today is9.9: Monitoring Leukopenia: Monitoring closely  Monitoring closely for neuropathy.      Return to clinic weekly for chemo every other week for follow-up

## 2021-08-28 ENCOUNTER — Other Ambulatory Visit: Payer: Federal, State, Local not specified - PPO

## 2021-08-29 MED FILL — Dexamethasone Sodium Phosphate Inj 100 MG/10ML: INTRAMUSCULAR | Qty: 1 | Status: AC

## 2021-08-30 ENCOUNTER — Inpatient Hospital Stay: Payer: Federal, State, Local not specified - PPO

## 2021-08-30 ENCOUNTER — Other Ambulatory Visit: Payer: Self-pay

## 2021-08-30 VITALS — BP 145/83 | HR 92 | Temp 98.3°F | Resp 18 | Ht 63.0 in | Wt 137.5 lb

## 2021-08-30 DIAGNOSIS — Z95828 Presence of other vascular implants and grafts: Secondary | ICD-10-CM

## 2021-08-30 DIAGNOSIS — Z170 Estrogen receptor positive status [ER+]: Secondary | ICD-10-CM

## 2021-08-30 DIAGNOSIS — C50211 Malignant neoplasm of upper-inner quadrant of right female breast: Secondary | ICD-10-CM | POA: Diagnosis not present

## 2021-08-30 LAB — CMP (CANCER CENTER ONLY)
ALT: 12 U/L (ref 0–44)
AST: 19 U/L (ref 15–41)
Albumin: 4.1 g/dL (ref 3.5–5.0)
Alkaline Phosphatase: 52 U/L (ref 38–126)
Anion gap: 9 (ref 5–15)
BUN: 9 mg/dL (ref 6–20)
CO2: 26 mmol/L (ref 22–32)
Calcium: 10.4 mg/dL — ABNORMAL HIGH (ref 8.9–10.3)
Chloride: 105 mmol/L (ref 98–111)
Creatinine: 0.71 mg/dL (ref 0.44–1.00)
GFR, Estimated: >60 mL/min (ref >60)
Glucose, Bld: 94 mg/dL (ref 70–99)
Potassium: 3.8 mmol/L (ref 3.5–5.1)
Sodium: 140 mmol/L (ref 135–145)
Total Bilirubin: 0.4 mg/dL (ref 0.3–1.2)
Total Protein: 7.4 g/dL (ref 6.5–8.1)

## 2021-08-30 LAB — CBC WITH DIFFERENTIAL/PLATELET
Abs Immature Granulocytes: 0.02 10*3/uL (ref 0.00–0.07)
Basophils Absolute: 0 10*3/uL (ref 0.0–0.1)
Basophils Relative: 0 %
Eosinophils Absolute: 0.1 10*3/uL (ref 0.0–0.5)
Eosinophils Relative: 1 %
HCT: 31.5 % — ABNORMAL LOW (ref 36.0–46.0)
Hemoglobin: 10.7 g/dL — ABNORMAL LOW (ref 12.0–15.0)
Immature Granulocytes: 0 %
Lymphocytes Relative: 23 %
Lymphs Abs: 1.3 10*3/uL (ref 0.7–4.0)
MCH: 32.3 pg (ref 26.0–34.0)
MCHC: 34 g/dL (ref 30.0–36.0)
MCV: 95.2 fL (ref 80.0–100.0)
Monocytes Absolute: 0.5 10*3/uL (ref 0.1–1.0)
Monocytes Relative: 8 %
Neutro Abs: 3.9 10*3/uL (ref 1.7–7.7)
Neutrophils Relative %: 68 %
Platelets: 302 10*3/uL (ref 150–400)
RBC: 3.31 MIL/uL — ABNORMAL LOW (ref 3.87–5.11)
RDW: 13.9 % (ref 11.5–15.5)
WBC: 5.7 10*3/uL (ref 4.0–10.5)
nRBC: 0 % (ref 0.0–0.2)

## 2021-08-30 LAB — PREGNANCY, URINE: Preg Test, Ur: NEGATIVE

## 2021-08-30 MED ORDER — HEPARIN SOD (PORK) LOCK FLUSH 100 UNIT/ML IV SOLN
500.0000 [IU] | Freq: Once | INTRAVENOUS | Status: AC | PRN
  Administered 2021-08-30: 500 [IU]

## 2021-08-30 MED ORDER — SODIUM CHLORIDE 0.9% FLUSH
10.0000 mL | Freq: Once | INTRAVENOUS | Status: AC
  Administered 2021-08-30: 10 mL

## 2021-08-30 MED ORDER — FAMOTIDINE 20 MG IN NS 100 ML IVPB
20.0000 mg | Freq: Once | INTRAVENOUS | Status: AC
  Administered 2021-08-30: 20 mg via INTRAVENOUS
  Filled 2021-08-30: qty 100

## 2021-08-30 MED ORDER — SODIUM CHLORIDE 0.9 % IV SOLN
10.0000 mg | Freq: Once | INTRAVENOUS | Status: AC
  Administered 2021-08-30: 10 mg via INTRAVENOUS
  Filled 2021-08-30: qty 10

## 2021-08-30 MED ORDER — ONDANSETRON HCL 4 MG/2ML IJ SOLN
8.0000 mg | Freq: Once | INTRAMUSCULAR | Status: AC
  Administered 2021-08-30: 8 mg via INTRAVENOUS
  Filled 2021-08-30: qty 4

## 2021-08-30 MED ORDER — SODIUM CHLORIDE 0.9 % IV SOLN: Freq: Once | INTRAVENOUS | Status: AC

## 2021-08-30 MED ORDER — SODIUM CHLORIDE 0.9% FLUSH
10.0000 mL | INTRAVENOUS | Status: DC | PRN
  Administered 2021-08-30: 10 mL

## 2021-08-30 MED ORDER — SODIUM CHLORIDE 0.9 % IV SOLN
80.0000 mg/m2 | Freq: Once | INTRAVENOUS | Status: AC
  Administered 2021-08-30: 132 mg via INTRAVENOUS
  Filled 2021-08-30: qty 22

## 2021-08-30 MED ORDER — DIPHENHYDRAMINE HCL 50 MG/ML IJ SOLN
25.0000 mg | Freq: Once | INTRAMUSCULAR | Status: AC
  Administered 2021-08-30: 25 mg via INTRAVENOUS
  Filled 2021-08-30: qty 1

## 2021-08-30 NOTE — Patient Instructions (Signed)
[facility]  Discharge Instructions: Thank you for choosing [facility] to provide your oncology and hematology care.   If you have a lab appointment with the [facility], please go directly to the [facility] and check in at the registration area.   Wear comfortable clothing and clothing appropriate for easy access to any Portacath or PICC line.   We strive to give you quality time with your provider. You may need to reschedule your appointment if you arrive late (15 or more minutes).  Arriving late affects you and other patients whose appointments are after yours.  Also, if you miss three or more appointments without notifying the office, you may be dismissed from the clinic at the provider's discretion.      For prescription refill requests, have your pharmacy contact our office and allow 72 hours for refills to be completed.    Today you received the following chemotherapy and/or immunotherapy agents: Taxol      To help prevent nausea and vomiting after your treatment, we encourage you to take your nausea medication as directed.  BELOW ARE SYMPTOMS THAT SHOULD BE REPORTED IMMEDIATELY: *FEVER GREATER THAN 100.4 F (38 C) OR HIGHER *CHILLS OR SWEATING *NAUSEA AND VOMITING THAT IS NOT CONTROLLED WITH YOUR NAUSEA MEDICATION *UNUSUAL SHORTNESS OF BREATH *UNUSUAL BRUISING OR BLEEDING *URINARY PROBLEMS (pain or burning when urinating, or frequent urination) *BOWEL PROBLEMS (unusual diarrhea, constipation, pain near the anus) TENDERNESS IN MOUTH AND THROAT WITH OR WITHOUT PRESENCE OF ULCERS (sore throat, sores in mouth, or a toothache) UNUSUAL RASH, SWELLING OR PAIN  UNUSUAL VAGINAL DISCHARGE OR ITCHING   Items with * indicate a potential emergency and should be followed up as soon as possible or go to the Emergency Department if any problems should occur.  Please show the CHEMOTHERAPY ALERT CARD or IMMUNOTHERAPY ALERT CARD at check-in to the  Emergency Department and triage nurse.  Should you have questions after your visit or need to cancel or reschedule your appointment, please contact [facility]  Dept: [phone]  and follow the prompts.  Office hours are 8:00 a.m. to 4:30 p.m. Monday - Friday. Please note that voicemails left after 4:00 p.m. may not be returned until the following business day.  We are closed weekends and major holidays. You have access to a nurse at all times for urgent questions. Please call the main number to the clinic Dept: [phone] and follow the prompts.   For any non-urgent questions, you may also contact your provider using MyChart. We now offer e-Visits for anyone 13 and older to request care online for non-urgent symptoms. For details visit [email or web address].   Also download the MyChart app! Go to the app store, search "MyChart", open the app, select [facility], and log in with your MyChart username and password.  Due to Covid, a mask is required upon entering the hospital/clinic. If you do not have a mask, one will be given to you upon arrival. For doctor visits, patients may have 1 support person aged 18 or older with them. For treatment visits, patients cannot have anyone with them due to current Covid guidelines and our immunocompromised population.

## 2021-09-05 NOTE — Progress Notes (Signed)
 Patient Care Team: Patient, [name] (Inactive) as PCP - General (General Practice) Stuart, Dawn C, RN as Oncology Nurse Navigator Martini, Keisha N, RN as Oncology Nurse Navigator  DIAGNOSIS:    ICD-10-CM   1. Malignant neoplasm of upper-inner quadrant of right breast in female, estrogen receptor positive (HCC)  C50.211    Z17.0       SUMMARY OF ONCOLOGIC HISTORY: Oncology History  Malignant neoplasm of upper-inner quadrant of right breast in female, estrogen receptor positive (HCC)  04/08/2021 Initial Diagnosis   Palpable right breast mass: Mammogram and US showed a 4.5cm right breast mass at the 12-1 o'clock position with associated microcalcifications, multiple additional masses from the 9-11 o'clock position in the right breast, calcifications in the subareolar right breast, 1.4cm, and two enlarged right axillary lymph nodes. 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 Two variants of uncertain significance (VUS) were detected - one in the APC gene called p.T2422I (c.7265C>T) and a second in the BAP1 gene called p.P302L (c.905C>T). The report dates are 04/19/2021 and 04/25/2021, respectively.  The BRCAplus panel offered by [employer] and includes sequencing and deletion/duplication analysis for the following 8 genes: ATM, BRCA1, BRCA2, CDH1, CHEK2, PALB2, PTEN, and TP53. The CancerNext-Expanded + RNAinsight gene panel offered by [employer] and includes sequencing and  rearrangement analysis for the following 77 genes: AIP, ALK, APC, ATM, AXIN2, BAP1, BARD1, BLM, BMPR1A, BRCA1, BRCA2, BRIP1, CDC73, CDH1, CDK4, CDKN1B, CDKN2A, CHEK2, CTNNA1, DICER1, FANCC, FH, FLCN, GALNT12, KIF1B, LZTR1, MAX, MEN1, MET, MLH1, MSH2, MSH3, MSH6, MUTYH, NBN, NF1, NF2, NTHL1, PALB2, PHOX2B, PMS2, POT1, PRKAR1A, PTCH1, PTEN, RAD51C, RAD51D, RB1, RECQL, RET, SDHA, SDHAF2, SDHB, SDHC, SDHD, SMAD4, SMARCA4, SMARCB1, SMARCE1, STK11, SUFU, TMEM127, TP53, TSC1, TSC2, VHL and XRCC2 (sequencing and deletion/duplication); EGFR, EGLN1, HOXB13, KIT, MITF, PDGFRA, POLD1 and POLE (sequencing only); EPCAM and GREM1 (deletion/duplication only). RNA data is routinely analyzed for use in variant interpretation for all genes.    05/10/2021 -  Chemotherapy   Patient is on Treatment Plan : BREAST ADJUVANT DOSE DENSE AC q14d / PACLitaxel q7d       CHIEF COMPLIANT: Cycle 10 Taxol  INTERVAL HISTORY: Diahn Chelsea Wise is a 40 y.o. with above-mentioned history of right breast cancer having completed neoadjuvant chemotherapy with dose dense Adriamycin and Cytoxan, currently on chemotherapy with Taxol. She reports to the clinic today for Cycle 10.   ALLERGIES:  has No Known Allergies.  MEDICATIONS:  Current Outpatient Medications  Medication Sig Dispense Refill   acetaminophen (TYLENOL) 500 MG tablet Take 500-1,000 mg by mouth every 6 (six) hours as needed for moderate pain.     Ascorbic Acid (VITAMIN C PO) Take 1 tablet by mouth daily.     cetirizine (ZYRTEC) 10 MG tablet Take 10 mg by mouth daily as needed for allergies.     ELDERBERRY PO Take 1 capsule by mouth daily.     hydrocortisone cream 1 % Apply 1 application topically 2 (two) times daily as needed for   itching.     lidocaine-prilocaine (EMLA) cream Apply to affected area once 30 g 3   Multiple Vitamin (MULTIVITAMIN WITH MINERALS) TABS tablet Take 2 tablets by mouth daily.     Multiple Vitamins-Minerals (ZINC PO) Take 1 tablet by mouth once a  week.     nystatin (MYCOSTATIN) 100000 UNIT/ML suspension TAKE 5 MLS (500,000 UNITS TOTAL) BY MOUTH IN THE MORNING AND AT BEDTIME. 60 mL 0   ondansetron (ZOFRAN) 8 MG tablet Take 1 tablet (8 mg total) by mouth 2 (two) times daily as needed. Start on the third day after chemotherapy. 30 tablet 1   prochlorperazine (COMPAZINE) 10 MG tablet Take 1 tablet (10 mg total) by mouth every 6 (six) hours as needed (Nausea or vomiting). 30 tablet 1   sodium chloride (OCEAN) 0.65 % SOLN nasal spray Place 1 spray into both nostrils as needed for congestion.     No current facility-administered medications for this visit.    PHYSICAL EXAMINATION: ECOG PERFORMANCE STATUS: 1 - Symptomatic but completely ambulatory  There were no vitals filed for this visit. There were no vitals filed for this visit.  LABORATORY DATA:  I have reviewed the data as listed CMP Latest Ref Rng & Units 08/30/2021 08/23/2021 08/16/2021  Glucose 70 - 99 mg/dL 94 98 85  BUN 6 - 20 mg/dL _0 Creatinine 0.44 - 1.00 mg/dL 0.71 0.66 0.67  Sodium 135 - 145 mmol/L 140 138 139  Potassium 3.5 - 5.1 mmol/L 3.8 3.7 3.5  Chloride 98 - 111 mmol/L 105 105 104  CO2 22 - 32 mmol/L _1 Calcium 8.9 - 10.3 mg/dL 10.4(H) 9.3 9.6  Total Protein 6.5 - 8.1 g/dL 7.4 7.0 7.0  Total Bilirubin 0.3 - 1.2 mg/dL 0.4 0.3 0.4  Alkaline Phos 38 - 126 U/L 52 49 48  AST 15 - 41 U/L _2 ALT 0 - 44 U/L _3 Lab Results  Component Value Date   WBC 6.3 09/06/2021   HGB 10.7 (L) 09/06/2021   HCT 31.8 (L) 09/06/2021   MCV 96.4 09/06/2021   PLT 304 09/06/2021   NEUTROABS 4.4 09/06/2021    ASSESSMENT & PLAN:  Malignant neoplasm of upper-inner quadrant of right breast in female, estrogen receptor positive ([place]) 04/08/2021:Palpable right breast mass: Mammogram and US showed a 4.5cm right breast mass at the 12-1 o'clock position with associated microcalcifications, multiple additional masses from the 9-11 o'clock position in the right breast,  calcifications in the subareolar right breast, 1.4cm, and two enlarged right axillary lymph nodes. 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  Neoadjuvant chemotherapy with dose dense Adriamycin and Cytoxan x4 followed by Taxol weekly x12 2. mastectomy with targeted node dissection 3.  Adjuvant radiation 4.  Followed by adjuvant antiestrogen therapy -------------------------------------------------------------------------------------------------------------------- Current treatment: completed 4 cycles of dose dense Adriamycin Cytoxan, today is cycle 10 Taxol   Chemo toxicities:  Mild fatigue Tongue discoloration Chemo induced anemia: Hemoglobin today is 10.7: Monitoring     Monitoring closely for neuropathy.  Mild sensation of the fingers but it is not bothering her. Breast MRI has been scheduled for 09/22/2021. I requested Dr. Donne Hazel to see her after the  MRI to discuss surgical options. She will not need the port and can be removed during surgery.  Return to clinic weekly for chemo every other week for follow-up    No orders of the defined types were placed in this encounter.  The patient has a good understanding of the overall plan. she agrees with it. she will call with any problems that may develop before the next visit here.  Total time spent: 30 mins including face to face time and time spent for planning, charting and coordination of care  Rulon Eisenmenger, MD, MPH 09/06/2021  I, Thana Ates, am acting as scribe for Dr. Nicholas Lose.  I have reviewed the above documentation for accuracy and completeness, and I agree with the above.

## 2021-09-06 ENCOUNTER — Inpatient Hospital Stay: Payer: Federal, State, Local not specified - PPO | Attending: Hematology and Oncology

## 2021-09-06 ENCOUNTER — Encounter: Payer: Self-pay | Admitting: *Deleted

## 2021-09-06 ENCOUNTER — Inpatient Hospital Stay: Payer: Federal, State, Local not specified - PPO

## 2021-09-06 ENCOUNTER — Other Ambulatory Visit: Payer: Self-pay

## 2021-09-06 ENCOUNTER — Inpatient Hospital Stay (HOSPITAL_BASED_OUTPATIENT_CLINIC_OR_DEPARTMENT_OTHER): Payer: Federal, State, Local not specified - PPO | Admitting: Hematology and Oncology

## 2021-09-06 DIAGNOSIS — D6481 Anemia due to antineoplastic chemotherapy: Secondary | ICD-10-CM | POA: Insufficient documentation

## 2021-09-06 DIAGNOSIS — Z79899 Other long term (current) drug therapy: Secondary | ICD-10-CM | POA: Diagnosis not present

## 2021-09-06 DIAGNOSIS — Z170 Estrogen receptor positive status [ER+]: Secondary | ICD-10-CM | POA: Diagnosis not present

## 2021-09-06 DIAGNOSIS — C50211 Malignant neoplasm of upper-inner quadrant of right female breast: Secondary | ICD-10-CM

## 2021-09-06 DIAGNOSIS — C773 Secondary and unspecified malignant neoplasm of axilla and upper limb lymph nodes: Secondary | ICD-10-CM | POA: Diagnosis present

## 2021-09-06 DIAGNOSIS — Z5111 Encounter for antineoplastic chemotherapy: Secondary | ICD-10-CM | POA: Diagnosis present

## 2021-09-06 DIAGNOSIS — T451X5A Adverse effect of antineoplastic and immunosuppressive drugs, initial encounter: Secondary | ICD-10-CM | POA: Diagnosis not present

## 2021-09-06 DIAGNOSIS — Z95828 Presence of other vascular implants and grafts: Secondary | ICD-10-CM

## 2021-09-06 LAB — CMP (CANCER CENTER ONLY)
ALT: 14 U/L (ref 0–44)
AST: 15 U/L (ref 15–41)
Albumin: 4 g/dL (ref 3.5–5.0)
Alkaline Phosphatase: 50 U/L (ref 38–126)
Anion gap: 9 (ref 5–15)
BUN: 7 mg/dL (ref 6–20)
CO2: 25 mmol/L (ref 22–32)
Calcium: 9.5 mg/dL (ref 8.9–10.3)
Chloride: 106 mmol/L (ref 98–111)
Creatinine: 0.67 mg/dL (ref 0.44–1.00)
GFR, Estimated: >60 mL/min (ref >60)
Glucose, Bld: 85 mg/dL (ref 70–99)
Potassium: 3.8 mmol/L (ref 3.5–5.1)
Sodium: 140 mmol/L (ref 135–145)
Total Bilirubin: 0.3 mg/dL (ref 0.3–1.2)
Total Protein: 7.1 g/dL (ref 6.5–8.1)

## 2021-09-06 LAB — CBC WITH DIFFERENTIAL/PLATELET
Abs Immature Granulocytes: 0.03 10*3/uL (ref 0.00–0.07)
Basophils Absolute: 0 10*3/uL (ref 0.0–0.1)
Basophils Relative: 0 %
Eosinophils Absolute: 0.1 10*3/uL (ref 0.0–0.5)
Eosinophils Relative: 1 %
HCT: 31.8 % — ABNORMAL LOW (ref 36.0–46.0)
Hemoglobin: 10.7 g/dL — ABNORMAL LOW (ref 12.0–15.0)
Immature Granulocytes: 1 %
Lymphocytes Relative: 19 %
Lymphs Abs: 1.2 10*3/uL (ref 0.7–4.0)
MCH: 32.4 pg (ref 26.0–34.0)
MCHC: 33.6 g/dL (ref 30.0–36.0)
MCV: 96.4 fL (ref 80.0–100.0)
Monocytes Absolute: 0.6 10*3/uL (ref 0.1–1.0)
Monocytes Relative: 10 %
Neutro Abs: 4.4 10*3/uL (ref 1.7–7.7)
Neutrophils Relative %: 69 %
Platelets: 304 10*3/uL (ref 150–400)
RBC: 3.3 MIL/uL — ABNORMAL LOW (ref 3.87–5.11)
RDW: 13.3 % (ref 11.5–15.5)
WBC: 6.3 10*3/uL (ref 4.0–10.5)
nRBC: 0 % (ref 0.0–0.2)

## 2021-09-06 LAB — PREGNANCY, URINE: Preg Test, Ur: NEGATIVE

## 2021-09-06 MED ORDER — FAMOTIDINE 20 MG IN NS 100 ML IVPB
20.0000 mg | Freq: Once | INTRAVENOUS | Status: AC
Start: 2021-09-06 — End: 2021-09-06
  Administered 2021-09-06: 20 mg via INTRAVENOUS
  Filled 2021-09-06: qty 100

## 2021-09-06 MED ORDER — ONDANSETRON HCL 4 MG/2ML IJ SOLN
8.0000 mg | Freq: Once | INTRAMUSCULAR | Status: AC
  Administered 2021-09-06: 8 mg via INTRAVENOUS
  Filled 2021-09-06: qty 4

## 2021-09-06 MED ORDER — DIPHENHYDRAMINE HCL 50 MG/ML IJ SOLN
25.0000 mg | Freq: Once | INTRAMUSCULAR | Status: AC
  Administered 2021-09-06: 25 mg via INTRAVENOUS
  Filled 2021-09-06: qty 1

## 2021-09-06 MED ORDER — SODIUM CHLORIDE 0.9 % IV SOLN
10.0000 mg | Freq: Once | INTRAVENOUS | Status: AC
  Administered 2021-09-06: 10 mg via INTRAVENOUS
  Filled 2021-09-06: qty 10

## 2021-09-06 MED ORDER — SODIUM CHLORIDE 0.9% FLUSH
10.0000 mL | Freq: Once | INTRAVENOUS | Status: AC
  Administered 2021-09-06: 10 mL

## 2021-09-06 MED ORDER — SODIUM CHLORIDE 0.9 % IV SOLN
80.0000 mg/m2 | Freq: Once | INTRAVENOUS | Status: AC
  Administered 2021-09-06: 132 mg via INTRAVENOUS
  Filled 2021-09-06: qty 22

## 2021-09-06 MED ORDER — SODIUM CHLORIDE 0.9 % IV SOLN: Freq: Once | INTRAVENOUS | Status: AC

## 2021-09-06 NOTE — Assessment & Plan Note (Signed)
04/08/2021:Palpable right breast mass: Mammogram and US showed a 4.5cm right breast mass at the 12-1 o'clock position with associated microcalcifications, multiple additional masses from the 9-11 o'clock position in the right breast, calcifications in the subareolar right breast, 1.4cm, and two enlarged right axillary lymph nodes. 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 Neoadjuvant chemotherapy with dose dense Adriamycin and Cytoxan x4 followed by Taxol weekly x12 2. mastectomy with targeted node dissection 3. Adjuvant radiation 4. Followed by adjuvant antiestrogen therapy -------------------------------------------------------------------------------------------------------------------- Current treatment:completed 4 cycles ofdose dense Adriamycin Cytoxan, today is cycle10Taxol  Chemo toxicities: Mild fatigue Tongue discoloration Chemo induced anemia: Hemoglobin today is9.9: Monitoring Leukopenia: Monitoring closely  Monitoring closely for neuropathy.    Return to clinic weekly for chemoevery other week for follow-up

## 2021-09-06 NOTE — Addendum Note (Signed)
Addended by: Nicholas Lose on: 09/06/2021 12:30 PM   Modules accepted: Orders

## 2021-09-07 ENCOUNTER — Encounter: Payer: Self-pay | Admitting: Hematology and Oncology

## 2021-09-08 ENCOUNTER — Telehealth: Payer: Self-pay | Admitting: *Deleted

## 2021-09-08 NOTE — Telephone Encounter (Signed)
Received appt with Dr. Donne Hazel on 09/29/21 arrive at 11am. Verified email sent with appt date and time.

## 2021-09-12 MED FILL — Dexamethasone Sodium Phosphate Inj 100 MG/10ML: INTRAMUSCULAR | Qty: 1 | Status: AC

## 2021-09-13 ENCOUNTER — Inpatient Hospital Stay: Payer: Federal, State, Local not specified - PPO

## 2021-09-13 ENCOUNTER — Other Ambulatory Visit: Payer: Self-pay

## 2021-09-13 VITALS — BP 136/86 | HR 86 | Temp 97.7°F | Resp 18 | Wt 137.2 lb

## 2021-09-13 DIAGNOSIS — Z170 Estrogen receptor positive status [ER+]: Secondary | ICD-10-CM

## 2021-09-13 DIAGNOSIS — C50211 Malignant neoplasm of upper-inner quadrant of right female breast: Secondary | ICD-10-CM

## 2021-09-13 DIAGNOSIS — Z95828 Presence of other vascular implants and grafts: Secondary | ICD-10-CM

## 2021-09-13 LAB — CBC WITH DIFFERENTIAL/PLATELET
Abs Immature Granulocytes: 0.05 10*3/uL (ref 0.00–0.07)
Basophils Absolute: 0 10*3/uL (ref 0.0–0.1)
Basophils Relative: 0 %
Eosinophils Absolute: 0.1 10*3/uL (ref 0.0–0.5)
Eosinophils Relative: 1 %
HCT: 31.4 % — ABNORMAL LOW (ref 36.0–46.0)
Hemoglobin: 10.6 g/dL — ABNORMAL LOW (ref 12.0–15.0)
Immature Granulocytes: 1 %
Lymphocytes Relative: 12 %
Lymphs Abs: 1.1 10*3/uL (ref 0.7–4.0)
MCH: 32.3 pg (ref 26.0–34.0)
MCHC: 33.8 g/dL (ref 30.0–36.0)
MCV: 95.7 fL (ref 80.0–100.0)
Monocytes Absolute: 0.6 10*3/uL (ref 0.1–1.0)
Monocytes Relative: 6 %
Neutro Abs: 7.3 10*3/uL (ref 1.7–7.7)
Neutrophils Relative %: 80 %
Platelets: 293 10*3/uL (ref 150–400)
RBC: 3.28 MIL/uL — ABNORMAL LOW (ref 3.87–5.11)
RDW: 12.8 % (ref 11.5–15.5)
WBC: 9 10*3/uL (ref 4.0–10.5)
nRBC: 0 % (ref 0.0–0.2)

## 2021-09-13 LAB — PREGNANCY, URINE: Preg Test, Ur: NEGATIVE

## 2021-09-13 LAB — CMP (CANCER CENTER ONLY)
ALT: 17 U/L (ref 0–44)
AST: 17 U/L (ref 15–41)
Albumin: 4 g/dL (ref 3.5–5.0)
Alkaline Phosphatase: 50 U/L (ref 38–126)
Anion gap: 10 (ref 5–15)
BUN: 10 mg/dL (ref 6–20)
CO2: 24 mmol/L (ref 22–32)
Calcium: 9.3 mg/dL (ref 8.9–10.3)
Chloride: 104 mmol/L (ref 98–111)
Creatinine: 0.7 mg/dL (ref 0.44–1.00)
GFR, Estimated: >60 mL/min (ref >60)
Glucose, Bld: 83 mg/dL (ref 70–99)
Potassium: 3.7 mmol/L (ref 3.5–5.1)
Sodium: 138 mmol/L (ref 135–145)
Total Bilirubin: 0.3 mg/dL (ref 0.3–1.2)
Total Protein: 7.1 g/dL (ref 6.5–8.1)

## 2021-09-13 MED ORDER — ONDANSETRON HCL 4 MG/2ML IJ SOLN
8.0000 mg | Freq: Once | INTRAMUSCULAR | Status: AC
  Administered 2021-09-13: 8 mg via INTRAVENOUS
  Filled 2021-09-13: qty 4

## 2021-09-13 MED ORDER — DIPHENHYDRAMINE HCL 50 MG/ML IJ SOLN
25.0000 mg | Freq: Once | INTRAMUSCULAR | Status: AC
Start: 2021-09-13 — End: 2021-09-13
  Administered 2021-09-13: 25 mg via INTRAVENOUS
  Filled 2021-09-13: qty 1

## 2021-09-13 MED ORDER — SODIUM CHLORIDE 0.9 % IV SOLN
10.0000 mg | Freq: Once | INTRAVENOUS | Status: AC
  Administered 2021-09-13: 10 mg via INTRAVENOUS
  Filled 2021-09-13: qty 10

## 2021-09-13 MED ORDER — SODIUM CHLORIDE 0.9 % IV SOLN
80.0000 mg/m2 | Freq: Once | INTRAVENOUS | Status: AC
  Administered 2021-09-13: 132 mg via INTRAVENOUS
  Filled 2021-09-13: qty 22

## 2021-09-13 MED ORDER — FAMOTIDINE 20 MG IN NS 100 ML IVPB
20.0000 mg | Freq: Once | INTRAVENOUS | Status: AC
  Administered 2021-09-13: 20 mg via INTRAVENOUS
  Filled 2021-09-13: qty 100

## 2021-09-13 MED ORDER — HEPARIN SOD (PORK) LOCK FLUSH 100 UNIT/ML IV SOLN
500.0000 [IU] | Freq: Once | INTRAVENOUS | Status: AC | PRN
  Administered 2021-09-13: 500 [IU]

## 2021-09-13 MED ORDER — SODIUM CHLORIDE 0.9% FLUSH
10.0000 mL | INTRAVENOUS | Status: DC | PRN
  Administered 2021-09-13: 10 mL

## 2021-09-13 MED ORDER — SODIUM CHLORIDE 0.9 % IV SOLN: Freq: Once | INTRAVENOUS | Status: AC

## 2021-09-13 MED ORDER — SODIUM CHLORIDE 0.9% FLUSH
10.0000 mL | Freq: Once | INTRAVENOUS | Status: AC
  Administered 2021-09-13: 10 mL

## 2021-09-13 NOTE — Patient Instructions (Signed)
[facility]  Discharge Instructions: Thank you for choosing [facility] to provide your oncology and hematology care.   If you have a lab appointment with the [facility], please go directly to the [facility] and check in at the registration area.   Wear comfortable clothing and clothing appropriate for easy access to any Portacath or PICC line.   We strive to give you quality time with your provider. You may need to reschedule your appointment if you arrive late (15 or more minutes).  Arriving late affects you and other patients whose appointments are after yours.  Also, if you miss three or more appointments without notifying the office, you may be dismissed from the clinic at the provider's discretion.      For prescription refill requests, have your pharmacy contact our office and allow 72 hours for refills to be completed.    Today you received the following chemotherapy and/or immunotherapy agents: Taxol      To help prevent nausea and vomiting after your treatment, we encourage you to take your nausea medication as directed.  BELOW ARE SYMPTOMS THAT SHOULD BE REPORTED IMMEDIATELY: *FEVER GREATER THAN 100.4 F (38 C) OR HIGHER *CHILLS OR SWEATING *NAUSEA AND VOMITING THAT IS NOT CONTROLLED WITH YOUR NAUSEA MEDICATION *UNUSUAL SHORTNESS OF BREATH *UNUSUAL BRUISING OR BLEEDING *URINARY PROBLEMS (pain or burning when urinating, or frequent urination) *BOWEL PROBLEMS (unusual diarrhea, constipation, pain near the anus) TENDERNESS IN MOUTH AND THROAT WITH OR WITHOUT PRESENCE OF ULCERS (sore throat, sores in mouth, or a toothache) UNUSUAL RASH, SWELLING OR PAIN  UNUSUAL VAGINAL DISCHARGE OR ITCHING   Items with * indicate a potential emergency and should be followed up as soon as possible or go to the Emergency Department if any problems should occur.  Please show the CHEMOTHERAPY ALERT CARD or IMMUNOTHERAPY ALERT CARD at check-in to the  Emergency Department and triage nurse.  Should you have questions after your visit or need to cancel or reschedule your appointment, please contact [facility]  Dept: [phone]  and follow the prompts.  Office hours are 8:00 a.m. to 4:30 p.m. Monday - Friday. Please note that voicemails left after 4:00 p.m. may not be returned until the following business day.  We are closed weekends and major holidays. You have access to a nurse at all times for urgent questions. Please call the main number to the clinic Dept: [phone] and follow the prompts.   For any non-urgent questions, you may also contact your provider using MyChart. We now offer e-Visits for anyone 13 and older to request care online for non-urgent symptoms. For details visit [email or web address].   Also download the MyChart app! Go to the app store, search "MyChart", open the app, select [facility], and log in with your MyChart username and password.  Due to Covid, a mask is required upon entering the hospital/clinic. If you do not have a mask, one will be given to you upon arrival. For doctor visits, patients may have 1 support person aged 18 or older with them. For treatment visits, patients cannot have anyone with them due to current Covid guidelines and our immunocompromised population.

## 2021-09-19 ENCOUNTER — Telehealth: Payer: Self-pay | Admitting: *Deleted

## 2021-09-19 MED FILL — Dexamethasone Sodium Phosphate Inj 100 MG/10ML: INTRAMUSCULAR | Qty: 1 | Status: AC

## 2021-09-19 NOTE — Progress Notes (Signed)
Patient Care Team: Patient, [name] (Inactive) as PCP - General (General Practice) [name], RN as Oncology Nurse Navigator [name], RN as Oncology Nurse Navigator  DIAGNOSIS:    ICD-10-CM   1. Malignant neoplasm of upper-inner quadrant of right breast in female, estrogen receptor positive ([place])  C50.211    Z17.0       SUMMARY OF ONCOLOGIC HISTORY: Oncology History  Malignant neoplasm of upper-inner quadrant of right breast in female, estrogen receptor positive ([place])  04/08/2021 Initial Diagnosis   Palpable right breast mass: Mammogram and US showed a 4.5cm right breast mass at the 12-1 o'clock position with associated microcalcifications, multiple additional masses from the 9-11 o'clock position in the right breast, calcifications in the subareolar right breast, 1.4cm, and two enlarged right axillary lymph nodes. 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 Two variants of uncertain significance (VUS) were detected - one in the APC gene called p.T2422I (c.7265C>T) and a second in the BAP1 gene called p.P302L (c.905C>T). The report dates are 04/19/2021 and 04/25/2021, respectively.  The BRCAplus panel offered by [employer] and includes sequencing and deletion/duplication analysis for the following 8 genes: ATM, BRCA1, BRCA2, CDH1, CHEK2, PALB2, PTEN, and TP53. The CancerNext-Expanded + RNAinsight gene panel offered by [employer] and includes sequencing and  rearrangement analysis for the following 77 genes: AIP, ALK, APC, ATM, AXIN2, BAP1, BARD1, BLM, BMPR1A, BRCA1, BRCA2, BRIP1, CDC73, CDH1, CDK4, CDKN1B, CDKN2A, CHEK2, CTNNA1, DICER1, FANCC, FH, FLCN, GALNT12, KIF1B, LZTR1, MAX, MEN1, MET, MLH1, MSH2, MSH3, MSH6, MUTYH, NBN, NF1, NF2, NTHL1, PALB2, PHOX2B, PMS2, POT1, PRKAR1A, PTCH1, PTEN, RAD51C, RAD51D, RB1, RECQL, RET, SDHA, SDHAF2, SDHB, SDHC, SDHD, SMAD4, SMARCA4, SMARCB1, SMARCE1, STK11, SUFU, TMEM127, TP53, TSC1, TSC2, VHL and XRCC2 (sequencing and deletion/duplication); EGFR, EGLN1, HOXB13, KIT, MITF, PDGFRA, POLD1 and POLE (sequencing only); EPCAM and GREM1 (deletion/duplication only). RNA data is routinely analyzed for use in variant interpretation for all genes.    05/10/2021 -  Chemotherapy   Patient is on Treatment Plan : BREAST ADJUVANT DOSE DENSE AC q14d / PACLitaxel q7d       CHIEF COMPLIANT: Cycle 12 Taxol  INTERVAL HISTORY: Chelsea Wise is a 40 y.o. with above-mentioned history of right breast cancer having completed neoadjuvant chemotherapy with dose dense Adriamycin and Cytoxan, currently on chemotherapy with Taxol. She reports to the clinic today for Cycle 12.  She has tolerated chemo extremely well.  She has a slight sore throat for which she is taking Mucinex.  Denies any nausea or vomiting.  Denies neuropathy.  ALLERGIES:  has No Known Allergies.  MEDICATIONS:  Current Outpatient Medications  Medication Sig Dispense Refill   acetaminophen (TYLENOL) 500 MG tablet Take 500-1,000 mg by mouth every 6 (six) hours as needed for moderate pain.     Ascorbic Acid (VITAMIN C PO) Take 1 tablet by mouth daily.     cetirizine (ZYRTEC) 10 MG tablet Take 10 mg by mouth daily as needed for allergies.  ELDERBERRY PO Take 1 capsule by mouth daily.     hydrocortisone cream 1 % Apply 1 application topically 2 (two) times daily as needed for itching.     lidocaine-prilocaine (EMLA) cream Apply to affected area once 30 g 3   Multiple  Vitamin (MULTIVITAMIN WITH MINERALS) TABS tablet Take 2 tablets by mouth daily.     Multiple Vitamins-Minerals (ZINC PO) Take 1 tablet by mouth once a week.     nystatin (MYCOSTATIN) 100000 UNIT/ML suspension TAKE 5 MLS (500,000 UNITS TOTAL) BY MOUTH IN THE MORNING AND AT BEDTIME. 60 mL 0   ondansetron (ZOFRAN) 8 MG tablet Take 1 tablet (8 mg total) by mouth 2 (two) times daily as needed. Start on the third day after chemotherapy. 30 tablet 1   prochlorperazine (COMPAZINE) 10 MG tablet Take 1 tablet (10 mg total) by mouth every 6 (six) hours as needed (Nausea or vomiting). 30 tablet 1   sodium chloride (OCEAN) 0.65 % SOLN nasal spray Place 1 spray into both nostrils as needed for congestion.     No current facility-administered medications for this visit.    PHYSICAL EXAMINATION: ECOG PERFORMANCE STATUS: 1 - Symptomatic but completely ambulatory  Vitals:   09/20/21 1141  BP: 127/83  Pulse: (!) 107  Resp: 18  Temp: 97.7 F (36.5 C)  SpO2: 99%   Filed Weights   09/20/21 1141  Weight: 137 lb 11.2 oz (62.5 kg)    LABORATORY DATA:  I have reviewed the data as listed CMP Latest Ref Rng & Units 09/13/2021 09/06/2021 08/30/2021  Glucose 70 - 99 mg/dL 83 85 94  BUN 6 - 20 mg/dL $Remove'10 7 9  'BBcaEml$ Creatinine 0.44 - 1.00 mg/dL 0.70 0.67 0.71  Sodium 135 - 145 mmol/L 138 140 140  Potassium 3.5 - 5.1 mmol/L 3.7 3.8 3.8  Chloride 98 - 111 mmol/L 104 106 105  CO2 22 - 32 mmol/L $RemoveB'24 25 26  'tPspcJZe$ Calcium 8.9 - 10.3 mg/dL 9.3 9.5 10.4(H)  Total Protein 6.5 - 8.1 g/dL 7.1 7.1 7.4  Total Bilirubin 0.3 - 1.2 mg/dL 0.3 0.3 0.4  Alkaline Phos 38 - 126 U/L 50 50 52  AST 15 - 41 U/L $Remo'17 15 19  'qjFdT$ ALT 0 - 44 U/L $Remo'17 14 12    'akzCn$ Lab Results  Component Value Date   WBC 5.2 09/20/2021   HGB 11.2 (L) 09/20/2021   HCT 33.3 (L) 09/20/2021   MCV 95.1 09/20/2021   PLT 301 09/20/2021   NEUTROABS 3.5 09/20/2021    ASSESSMENT & PLAN:  Malignant neoplasm of upper-inner quadrant of right breast in female, estrogen receptor  positive ([place]) 04/08/2021:Palpable right breast mass: Mammogram and US showed a 4.5cm right breast mass at the 12-1 o'clock position with associated microcalcifications, multiple additional masses from the 9-11 o'clock position in the right breast, calcifications in the subareolar right breast, 1.4cm, and two enlarged right axillary lymph nodes. 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  Neoadjuvant chemotherapy with dose dense Adriamycin and Cytoxan x4 followed by Taxol weekly x12 2. mastectomy with targeted node dissection 3.  Adjuvant radiation 4.  Followed by adjuvant antiestrogen therapy -------------------------------------------------------------------------------------------------------------------- Current treatment: completed 4 cycles of dose dense Adriamycin Cytoxan, today is cycle 12 Taxol  Chemo toxicities:  Mild fatigue Tongue discoloration Chemo induced anemia: Hemoglobin today is 10.7: Monitoring    Monitoring closely for neuropathy.  Mild sensation of the fingers but it is not bothering her. Breast MRI has been scheduled for 09/22/2021. I requested Dr. Donne Hazel to see her after the MRI to discuss surgical options.     Return to clinic after surgery to discuss final pathology report    No orders of the defined types were placed in this encounter.  The patient has a good understanding of the overall plan. she agrees with it. she will call with any problems that may develop before the next visit here.  Total time spent: 30 mins including face to face time and time spent for planning, charting and coordination of care  Rulon Eisenmenger, MD, MPH 09/20/2021  I, Thana Ates, am acting as scribe for Dr. Nicholas Lose.  I have reviewed the above documentation for accuracy and completeness, and I agree with the above.

## 2021-09-19 NOTE — Telephone Encounter (Signed)
Received call from pt with complaint of cough and mucus congestion at night. Pt denies fever and shortness of breath. Per MD pt to take OTC Mucinex DM prior to going to bed.  Pt will be seen in clinic tomorrow to evaluate effectiveness of OTC medication.  Pt verbalized understanding.

## 2021-09-20 ENCOUNTER — Inpatient Hospital Stay: Payer: Federal, State, Local not specified - PPO | Admitting: Hematology and Oncology

## 2021-09-20 ENCOUNTER — Inpatient Hospital Stay: Payer: Federal, State, Local not specified - PPO

## 2021-09-20 ENCOUNTER — Encounter: Payer: Self-pay | Admitting: *Deleted

## 2021-09-20 ENCOUNTER — Other Ambulatory Visit: Payer: Self-pay

## 2021-09-20 VITALS — HR 95

## 2021-09-20 DIAGNOSIS — C50211 Malignant neoplasm of upper-inner quadrant of right female breast: Secondary | ICD-10-CM

## 2021-09-20 DIAGNOSIS — Z95828 Presence of other vascular implants and grafts: Secondary | ICD-10-CM

## 2021-09-20 DIAGNOSIS — Z170 Estrogen receptor positive status [ER+]: Secondary | ICD-10-CM

## 2021-09-20 LAB — CBC WITH DIFFERENTIAL/PLATELET
Abs Immature Granulocytes: 0.02 10*3/uL (ref 0.00–0.07)
Basophils Absolute: 0 10*3/uL (ref 0.0–0.1)
Basophils Relative: 0 %
Eosinophils Absolute: 0.1 10*3/uL (ref 0.0–0.5)
Eosinophils Relative: 1 %
HCT: 33.3 % — ABNORMAL LOW (ref 36.0–46.0)
Hemoglobin: 11.2 g/dL — ABNORMAL LOW (ref 12.0–15.0)
Immature Granulocytes: 0 %
Lymphocytes Relative: 22 %
Lymphs Abs: 1.1 10*3/uL (ref 0.7–4.0)
MCH: 32 pg (ref 26.0–34.0)
MCHC: 33.6 g/dL (ref 30.0–36.0)
MCV: 95.1 fL (ref 80.0–100.0)
Monocytes Absolute: 0.5 10*3/uL (ref 0.1–1.0)
Monocytes Relative: 9 %
Neutro Abs: 3.5 10*3/uL (ref 1.7–7.7)
Neutrophils Relative %: 68 %
Platelets: 301 10*3/uL (ref 150–400)
RBC: 3.5 MIL/uL — ABNORMAL LOW (ref 3.87–5.11)
RDW: 13.1 % (ref 11.5–15.5)
WBC: 5.2 10*3/uL (ref 4.0–10.5)
nRBC: 0 % (ref 0.0–0.2)

## 2021-09-20 LAB — CMP (CANCER CENTER ONLY)
ALT: 13 U/L (ref 0–44)
AST: 19 U/L (ref 15–41)
Albumin: 4.2 g/dL (ref 3.5–5.0)
Alkaline Phosphatase: 48 U/L (ref 38–126)
Anion gap: 10 (ref 5–15)
BUN: 8 mg/dL (ref 6–20)
CO2: 24 mmol/L (ref 22–32)
Calcium: 9.5 mg/dL (ref 8.9–10.3)
Chloride: 107 mmol/L (ref 98–111)
Creatinine: 0.7 mg/dL (ref 0.44–1.00)
GFR, Estimated: >60 mL/min (ref >60)
Glucose, Bld: 93 mg/dL (ref 70–99)
Potassium: 3.8 mmol/L (ref 3.5–5.1)
Sodium: 141 mmol/L (ref 135–145)
Total Bilirubin: 0.3 mg/dL (ref 0.3–1.2)
Total Protein: 7.2 g/dL (ref 6.5–8.1)

## 2021-09-20 LAB — PREGNANCY, URINE: Preg Test, Ur: NEGATIVE

## 2021-09-20 MED ORDER — SODIUM CHLORIDE 0.9% FLUSH
10.0000 mL | INTRAVENOUS | Status: DC | PRN
  Administered 2021-09-20: 10 mL

## 2021-09-20 MED ORDER — SODIUM CHLORIDE 0.9 % IV SOLN: Freq: Once | INTRAVENOUS | Status: AC

## 2021-09-20 MED ORDER — ONDANSETRON HCL 4 MG/2ML IJ SOLN
8.0000 mg | Freq: Once | INTRAMUSCULAR | Status: AC
  Administered 2021-09-20: 8 mg via INTRAVENOUS
  Filled 2021-09-20: qty 4

## 2021-09-20 MED ORDER — DIPHENHYDRAMINE HCL 50 MG/ML IJ SOLN
25.0000 mg | Freq: Once | INTRAMUSCULAR | Status: AC
  Administered 2021-09-20: 25 mg via INTRAVENOUS
  Filled 2021-09-20: qty 1

## 2021-09-20 MED ORDER — SODIUM CHLORIDE 0.9 % IV SOLN
80.0000 mg/m2 | Freq: Once | INTRAVENOUS | Status: AC
  Administered 2021-09-20: 132 mg via INTRAVENOUS
  Filled 2021-09-20: qty 22

## 2021-09-20 MED ORDER — FAMOTIDINE 20 MG IN NS 100 ML IVPB
20.0000 mg | Freq: Once | INTRAVENOUS | Status: AC
  Administered 2021-09-20: 20 mg via INTRAVENOUS
  Filled 2021-09-20: qty 100

## 2021-09-20 MED ORDER — HEPARIN SOD (PORK) LOCK FLUSH 100 UNIT/ML IV SOLN
500.0000 [IU] | Freq: Once | INTRAVENOUS | Status: AC | PRN
  Administered 2021-09-20: 500 [IU]

## 2021-09-20 MED ORDER — SODIUM CHLORIDE 0.9% FLUSH
10.0000 mL | Freq: Once | INTRAVENOUS | Status: AC
  Administered 2021-09-20: 10 mL

## 2021-09-20 MED ORDER — SODIUM CHLORIDE 0.9 % IV SOLN
10.0000 mg | Freq: Once | INTRAVENOUS | Status: AC
  Administered 2021-09-20: 10 mg via INTRAVENOUS
  Filled 2021-09-20: qty 10

## 2021-09-20 NOTE — Assessment & Plan Note (Signed)
04/08/2021:Palpable right breast mass: Mammogram and US showed a 4.5cm right breast mass at the 12-1 o'clock position with associated microcalcifications, multiple additional masses from the 9-11 o'clock position in the right breast, calcifications in the subareolar right breast, 1.4cm, and two enlarged right axillary lymph nodes. 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 Neoadjuvant chemotherapy with dose dense Adriamycin and Cytoxan x4 followed by Taxol weekly x12 2. mastectomy with targeted node dissection 3. Adjuvant radiation 4. Followed by adjuvant antiestrogen therapy -------------------------------------------------------------------------------------------------------------------- Current treatment:completed 4 cycles ofdose dense Adriamycin Cytoxan, today is cycle12Taxol  Chemo toxicities: Mild fatigue Tongue discoloration Chemo induced anemia: Hemoglobin today is10.7: Monitoring   Monitoring closely for neuropathy. Mild sensation of the fingers but it is not bothering her. Breast MRI has been scheduled for 09/22/2021. I requested Dr. Donne Hazel to see her after the MRI to discuss surgical options.    Return to clinic after surgery to discuss final pathology report

## 2021-09-20 NOTE — Patient Instructions (Signed)
[facility]  Discharge Instructions: Thank you for choosing [facility] to provide your oncology and hematology care.   If you have a lab appointment with the [facility], please go directly to the [facility] and check in at the registration area.   Wear comfortable clothing and clothing appropriate for easy access to any Portacath or PICC line.   We strive to give you quality time with your provider. You may need to reschedule your appointment if you arrive late (15 or more minutes).  Arriving late affects you and other patients whose appointments are after yours.  Also, if you miss three or more appointments without notifying the office, you may be dismissed from the clinic at the provider's discretion.      For prescription refill requests, have your pharmacy contact our office and allow 72 hours for refills to be completed.    Today you received the following chemotherapy and/or immunotherapy agents: Taxol     To help prevent nausea and vomiting after your treatment, we encourage you to take your nausea medication as directed.  BELOW ARE SYMPTOMS THAT SHOULD BE REPORTED IMMEDIATELY: *FEVER GREATER THAN 100.4 F (38 C) OR HIGHER *CHILLS OR SWEATING *NAUSEA AND VOMITING THAT IS NOT CONTROLLED WITH YOUR NAUSEA MEDICATION *UNUSUAL SHORTNESS OF BREATH *UNUSUAL BRUISING OR BLEEDING *URINARY PROBLEMS (pain or burning when urinating, or frequent urination) *BOWEL PROBLEMS (unusual diarrhea, constipation, pain near the anus) TENDERNESS IN MOUTH AND THROAT WITH OR WITHOUT PRESENCE OF ULCERS (sore throat, sores in mouth, or a toothache) UNUSUAL RASH, SWELLING OR PAIN  UNUSUAL VAGINAL DISCHARGE OR ITCHING   Items with * indicate a potential emergency and should be followed up as soon as possible or go to the Emergency Department if any problems should occur.  Please show the CHEMOTHERAPY ALERT CARD or IMMUNOTHERAPY ALERT CARD at check-in to the  Emergency Department and triage nurse.  Should you have questions after your visit or need to cancel or reschedule your appointment, please contact [facility]  Dept: [phone]  and follow the prompts.  Office hours are 8:00 a.m. to 4:30 p.m. Monday - Friday. Please note that voicemails left after 4:00 p.m. may not be returned until the following business day.  We are closed weekends and major holidays. You have access to a nurse at all times for urgent questions. Please call the main number to the clinic Dept: [phone] and follow the prompts.   For any non-urgent questions, you may also contact your provider using MyChart. We now offer e-Visits for anyone 69 and older to request care online for non-urgent symptoms. For details visit [email or web address].   Also download the MyChart app! Go to the app store, search "MyChart", open the app, select [facility], and log in with your MyChart username and password.  Due to Covid, a mask is required upon entering the hospital/clinic. If you do not have a mask, one will be given to you upon arrival. For doctor visits, patients may have 1 support person aged 51 or older with them. For treatment visits, patients cannot have anyone with them due to current Covid guidelines and our immunocompromised population.

## 2021-09-20 NOTE — Addendum Note (Signed)
Addended by: Nicholas Lose on: 09/20/2021 11:54 AM   Modules accepted: Level of Service

## 2021-09-22 ENCOUNTER — Other Ambulatory Visit: Payer: Self-pay

## 2021-09-22 ENCOUNTER — Encounter: Payer: Self-pay | Admitting: *Deleted

## 2021-09-22 ENCOUNTER — Ambulatory Visit
Admission: RE | Admit: 2021-09-22 | Discharge: 2021-09-22 | Disposition: A | Discharge status: Home / Self Care | Payer: Federal, State, Local not specified - PPO | Source: Ambulatory Visit | Attending: Hematology and Oncology | Admitting: Hematology and Oncology

## 2021-09-22 DIAGNOSIS — Z170 Estrogen receptor positive status [ER+]: Secondary | ICD-10-CM

## 2021-09-22 DIAGNOSIS — C50211 Malignant neoplasm of upper-inner quadrant of right female breast: Secondary | ICD-10-CM

## 2021-09-22 MED ORDER — GADOBUTROL 1 MMOL/ML IV SOLN
6.0000 mL | Freq: Once | INTRAVENOUS | Status: AC | PRN
  Administered 2021-09-22: 6 mL via INTRAVENOUS

## 2021-09-29 ENCOUNTER — Other Ambulatory Visit: Payer: Self-pay | Admitting: General Surgery

## 2021-09-29 DIAGNOSIS — Z170 Estrogen receptor positive status [ER+]: Secondary | ICD-10-CM

## 2021-09-29 DIAGNOSIS — C50211 Malignant neoplasm of upper-inner quadrant of right female breast: Secondary | ICD-10-CM

## 2021-10-04 ENCOUNTER — Encounter: Payer: Self-pay | Admitting: *Deleted

## 2021-10-04 ENCOUNTER — Other Ambulatory Visit: Payer: Self-pay | Admitting: General Surgery

## 2021-10-04 DIAGNOSIS — Z170 Estrogen receptor positive status [ER+]: Secondary | ICD-10-CM

## 2021-10-04 DIAGNOSIS — C50211 Malignant neoplasm of upper-inner quadrant of right female breast: Secondary | ICD-10-CM

## 2021-10-08 IMAGING — US US BREAST*R* LIMITED INC AXILLA
1 series · 12 of 25 positions shown · non-contrast
Comparison: None.

CLINICAL DATA: Palpable lump within the upper RIGHT breast. This is
patient's first mammogram.

EXAM:
DIGITAL DIAGNOSTIC BILATERAL MAMMOGRAM WITH TOMOSYNTHESIS AND CAD;
ULTRASOUND RIGHT BREAST LIMITED
TECHNIQUE: Bilateral digital diagnostic mammography and breast tomosynthesis
was performed. The images were evaluated with computer-aided
detection.; Targeted ultrasound examination of the right breast was
performed

[Series 1: us breast*right* limited inc axilla · 0.06mm/px · 12 of 28 slices shown]
[im 2/28]
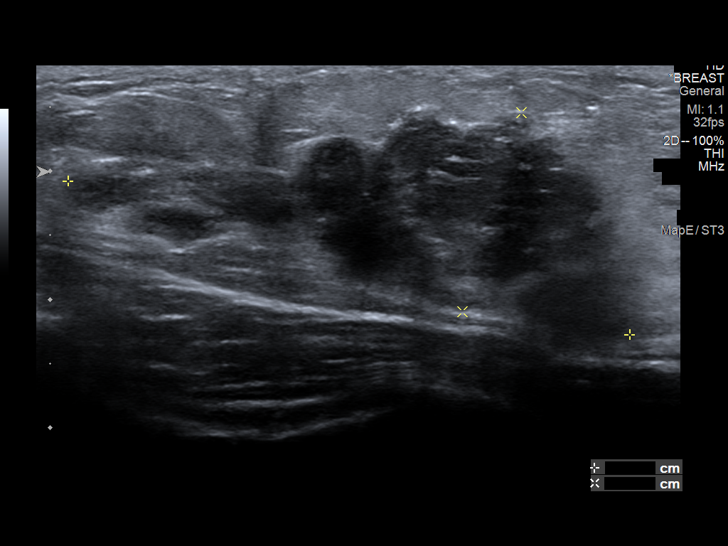
[im 4/28]
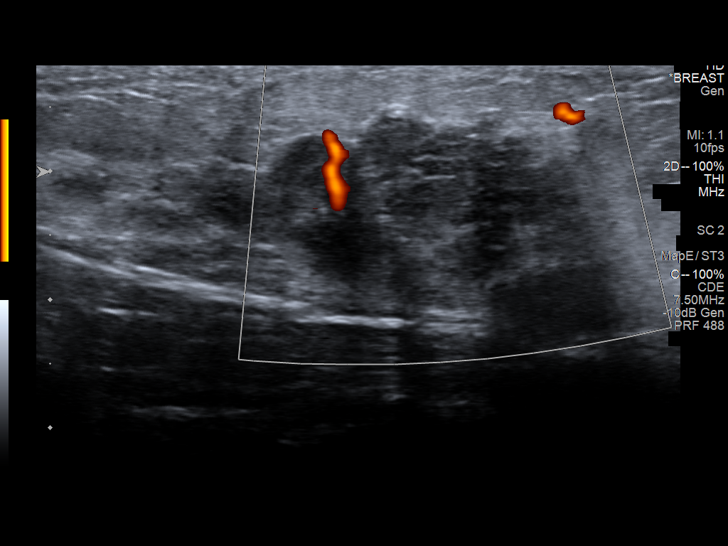
[im 6/28]
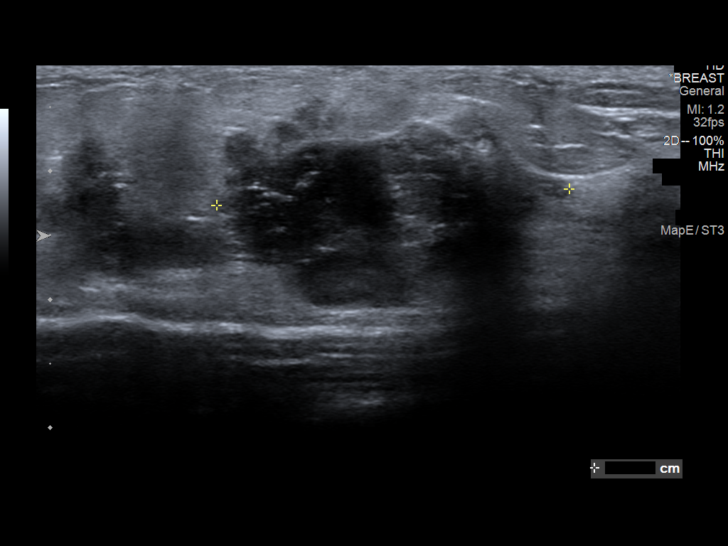
[im 8/28]
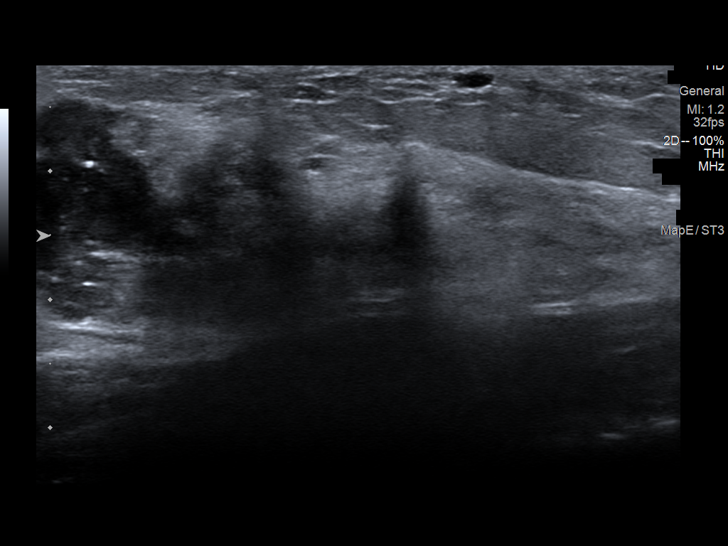
[im 11/28]
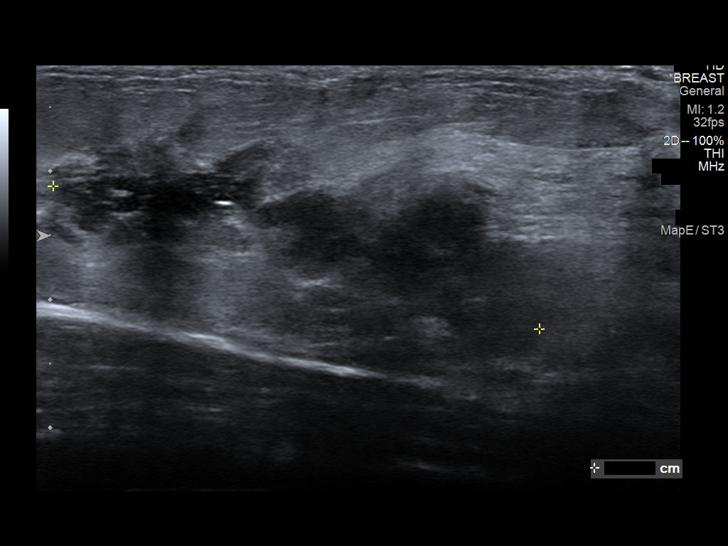
[im 13/28]
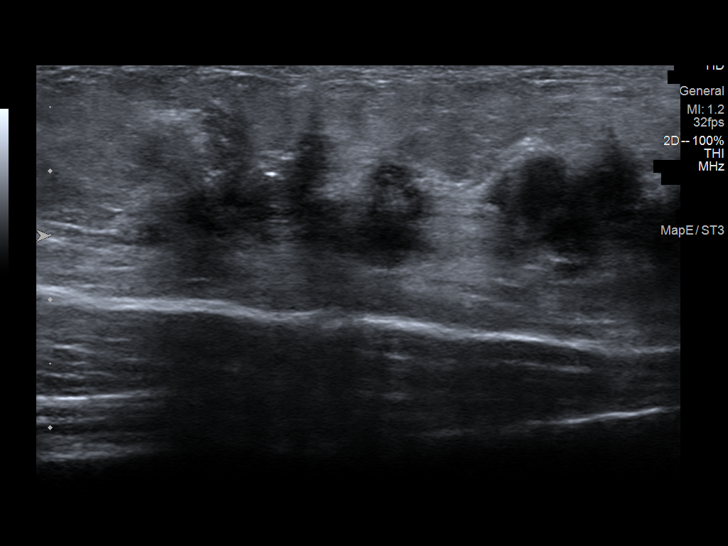
[im 15/28]
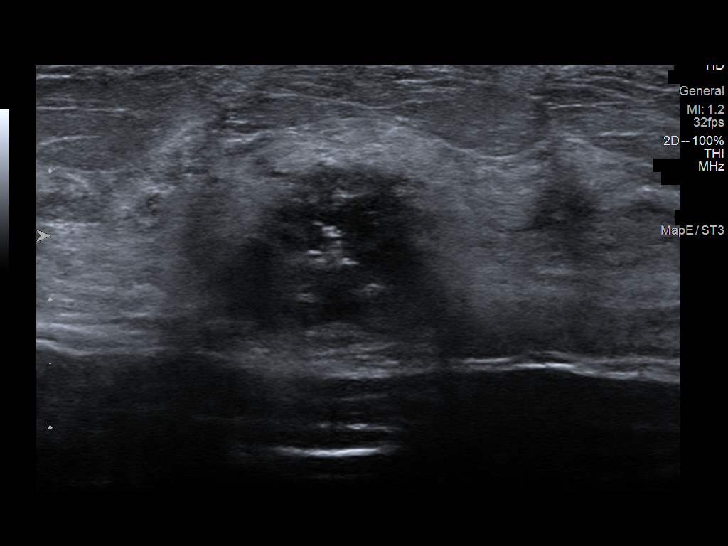
[im 17/28]
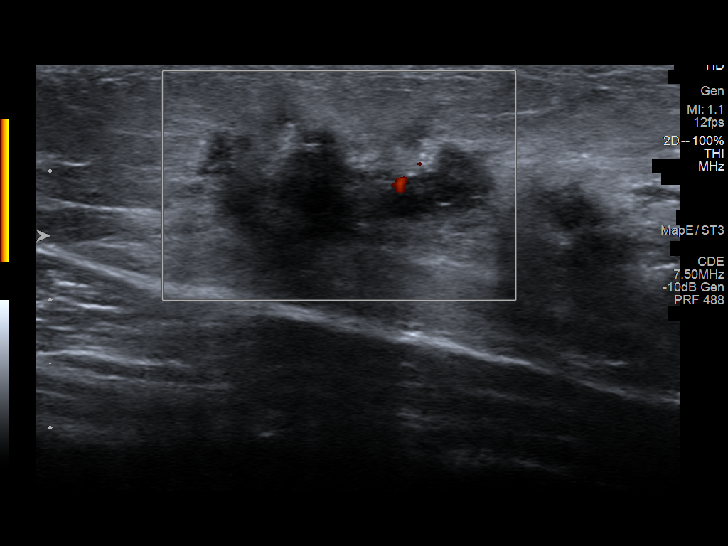
[im 20/28]
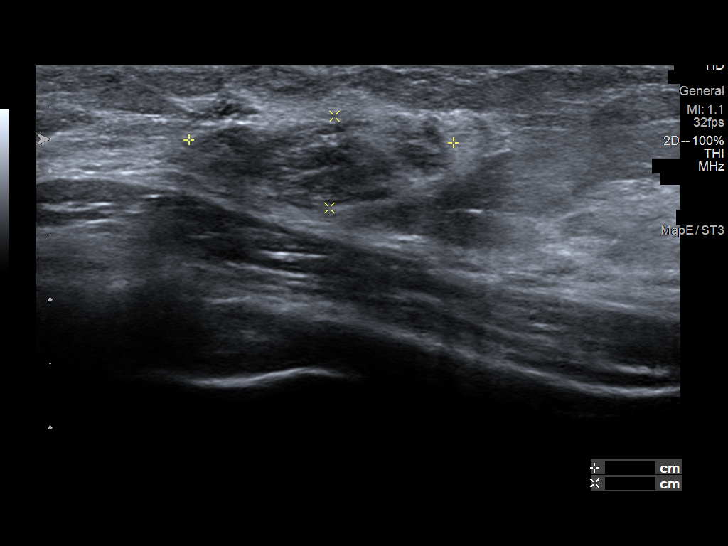
[im 22/28]
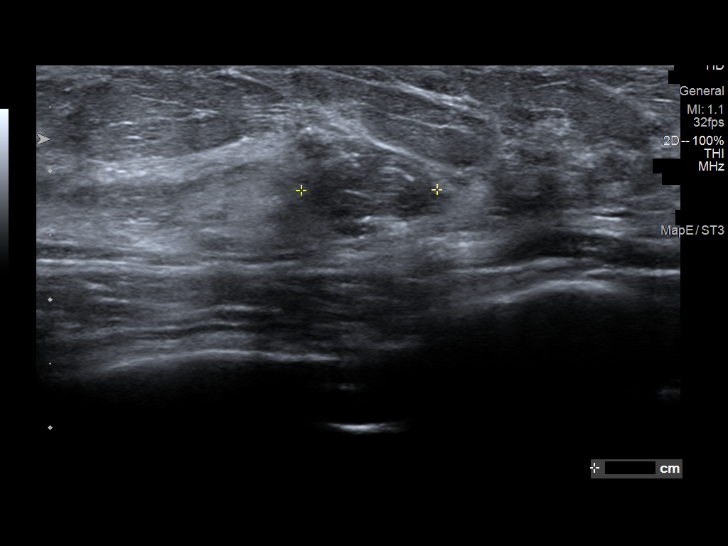
[im 24/28]
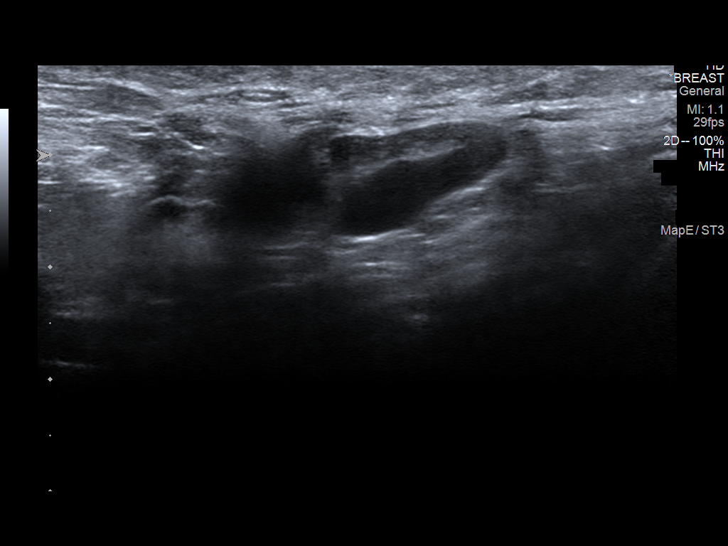
[im 26/28]
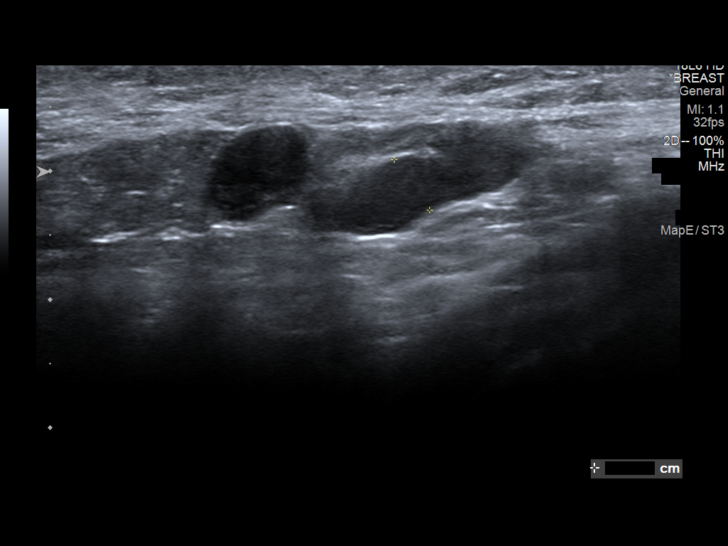

[12 of 25 positions shown; findings below may reference images not displayed]

ACR Breast Density Category c: The breast tissue is heterogeneously
dense, which may obscure small masses.
FINDINGS: Bilateral diagnostic mammogram:

RIGHT breast: There is a spiculated mass within the upper RIGHT
breast, 12-1 o'clock axis, measuring approximately 2.5 cm greatest
dimension, with associated suspicious pleomorphic and coarse
heterogeneous calcifications, corresponding to the palpable area of
concern.

There are multiple additional irregular masses with associated
architectural distortion located within the upper-outer quadrant of
the RIGHT breast, difficult to measure individually but overall
measuring approximately 7 cm AP dimension and approximately 5 cm
transverse dimension, all also associated with suspicious
pleomorphic and coarse heterogeneous calcifications.

There are additional suspicious fine pleomorphic calcifications at
the nipple and within the immediate subareolar RIGHT breast,
measuring 1.4 cm extent.

LEFT breast: There are no dominant masses, suspicious calcifications
or secondary signs of malignancy within the LEFT breast.

Targeted ultrasound is performed, showing an irregular hypoechoic
mass in the RIGHT breast at the 12-1 o'clock axis, 6 cm from the
nipple, measuring 4.5 x 1.6 x 2.8 cm, corresponding to the palpable
area of concern.

There are additional contiguous and near contiguous masses within
the outer RIGHT breast, 9-11 o'clock axes, 3-5 cm from the nipple,
difficult to measure sonographically, corresponding to the greater
than 7 cm extent seen mammographically.

RIGHT axilla was evaluated showing 2 enlarged/morphologically
abnormal lymph nodes, with cortical thickness up to 5 mm.
IMPRESSION: 1. Highly suspicious mass within the RIGHT breast at the 12-1
o'clock axis, 6 cm from the nipple, measuring 4.5 cm, with
associated pleomorphic microcalcifications, corresponding to the
palpable area of concern.
2. Multiple additional highly suspicious masses within the outer
RIGHT breast, extending from the 9-11 o'clock axes, extending 3-5 cm
from the nipple, contiguous and near contiguous throughout and
therefore difficult to measure sonographically, but corresponding to
the greater than 7 cm extent seen mammographically, also with
associated pleomorphic calcifications, highly suspicious for
additional multifocal/multicentric disease.
3. Two enlarged lymph nodes in the RIGHT axilla, highly suspicious
for metastatic lymphadenopathy.
4. Additional fine pleomorphic calcifications within the subareolar
RIGHT breast extending into the nipple, measuring 1.4 cm extent,
suspicious for additional multicentric disease.
5. No evidence of malignancy within the LEFT breast.

RECOMMENDATION:
1. Ultrasound-guided biopsies (2 sites) to include the palpable mass
in the RIGHT breast at the 12-1 o'clock axis (measuring 4.5 cm) and
the anterior aspect of the contiguous masses closest to the RIGHT
nipple.
2. Ultrasound-guided biopsy of 1 of the enlarged lymph nodes in the
RIGHT axilla
3. If findings are confirmed to be malignancy, and if breast
conservation is considered, central duct excision would be
eventually required for the additional suspicious calcifications in
the RIGHT nipple and adjacent immediate subareolar RIGHT breast.

Ultrasound-guided biopsies will be scheduled at patient's
convenience.

I have discussed the findings and recommendations with the patient.
If applicable, a reminder letter will be sent to the patient
regarding the next appointment.

BI-RADS CATEGORY  5: Highly suggestive of malignancy.

## 2021-10-08 IMAGING — MG DIGITAL DIAGNOSTIC BILAT W/ TOMO W/ CAD
7 of 15 series · 7 of 40 positions shown · non-contrast
Comparison: None.

CLINICAL DATA: Palpable lump within the upper RIGHT breast. This is
patient's first mammogram.

EXAM:
DIGITAL DIAGNOSTIC BILATERAL MAMMOGRAM WITH TOMOSYNTHESIS AND CAD;
ULTRASOUND RIGHT BREAST LIMITED
TECHNIQUE: Bilateral digital diagnostic mammography and breast tomosynthesis
was performed. The images were evaluated with computer-aided
detection.; Targeted ultrasound examination of the right breast was
performed

[R CC]
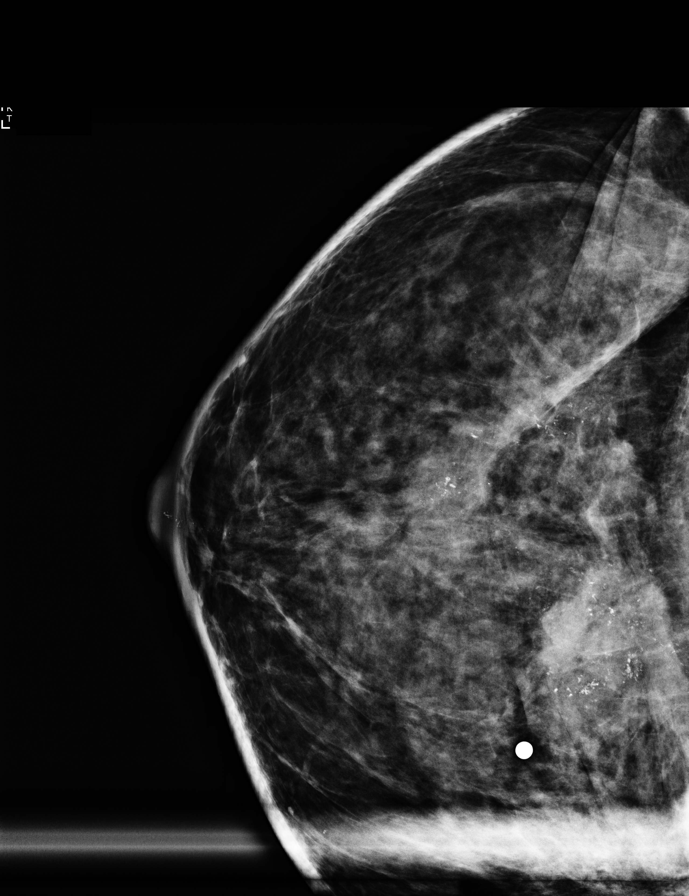

[R ML]
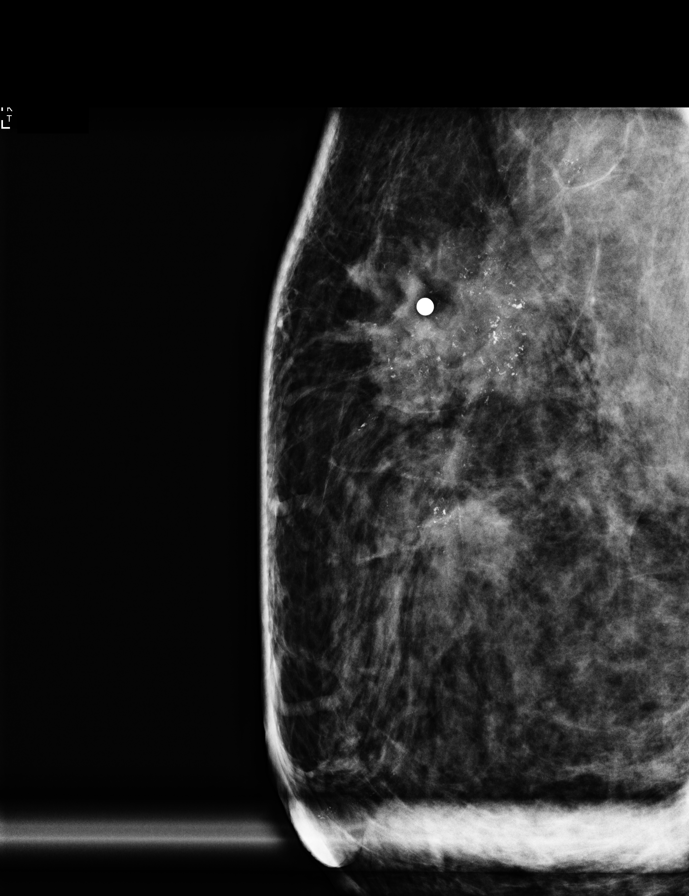

[R MLO synth-2D (1 of 2)]
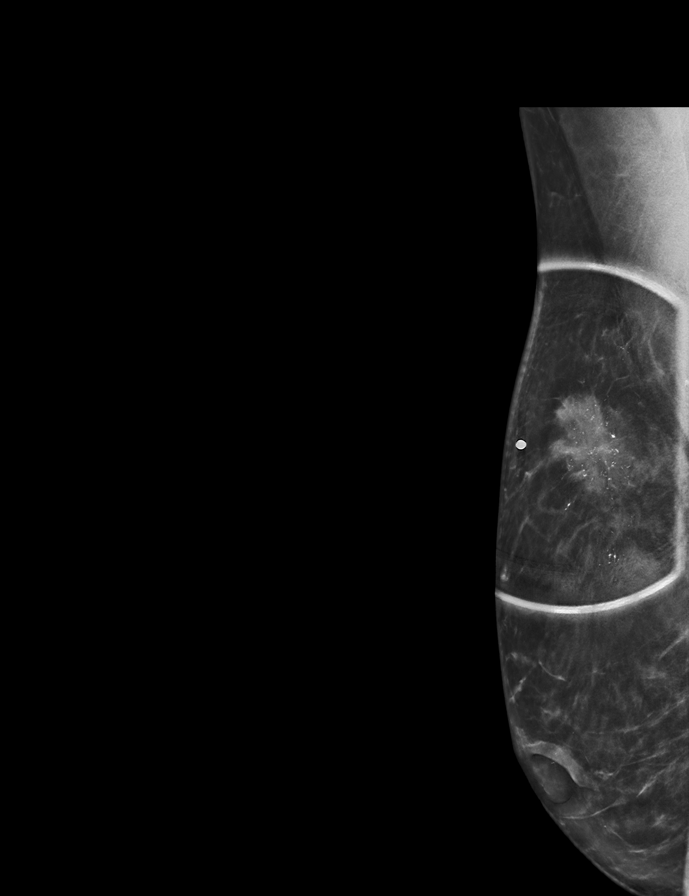

[R XCCL synth-2D]
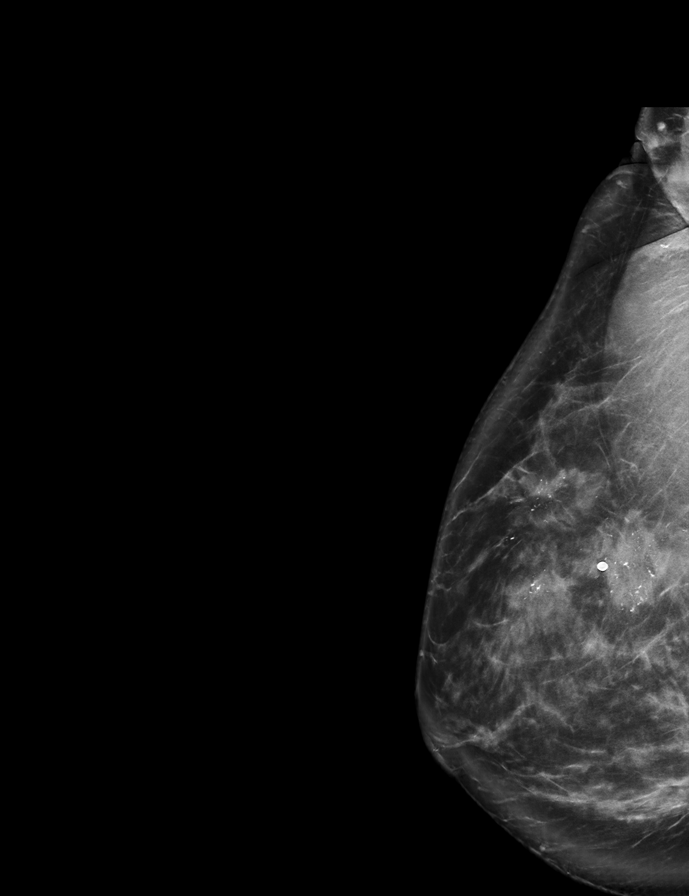

[L CC synth-2D]
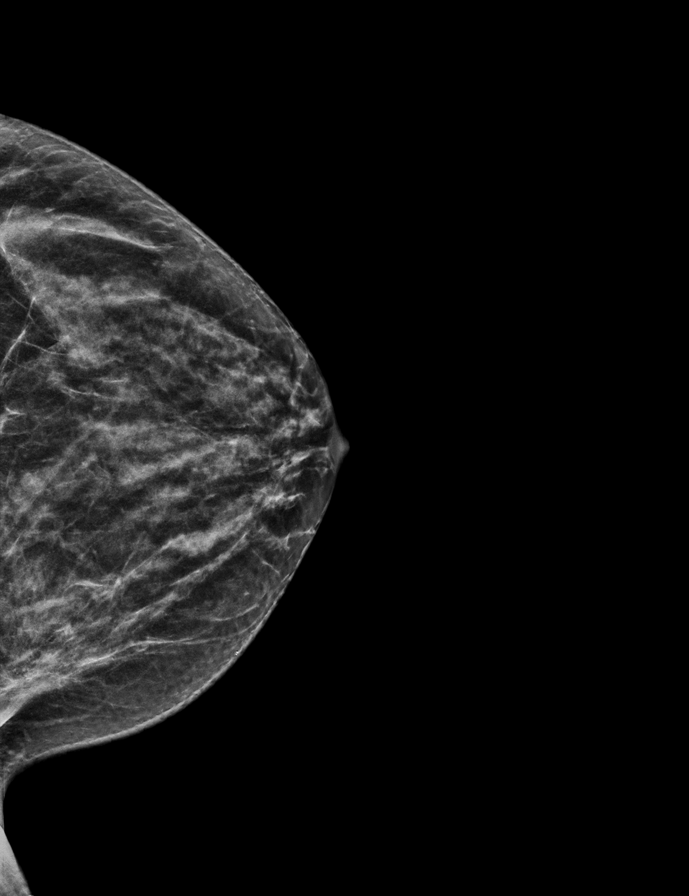

[R MLO synth-2D (2 of 2)]
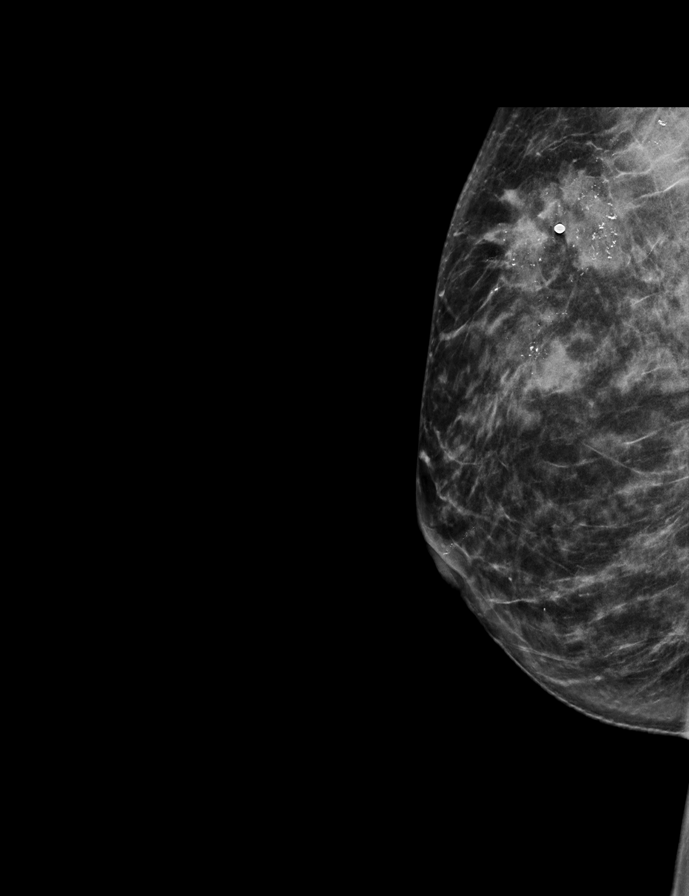

[L XCCL synth-2D]
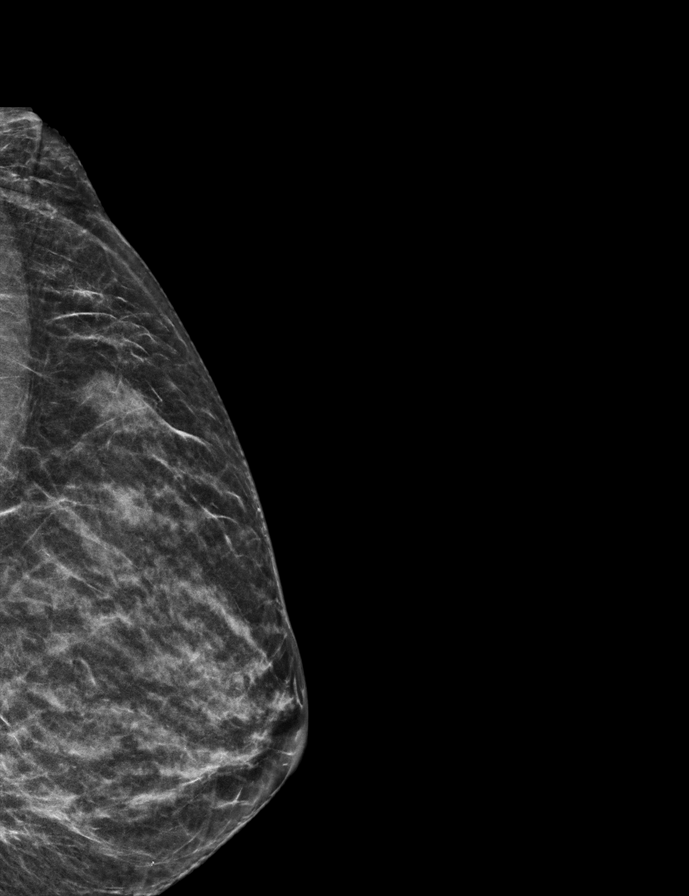

[7 of 40 positions shown; findings below may reference images not displayed]

ACR Breast Density Category c: The breast tissue is heterogeneously
dense, which may obscure small masses.
FINDINGS: Bilateral diagnostic mammogram:

RIGHT breast: There is a spiculated mass within the upper RIGHT
breast, 12-1 o'clock axis, measuring approximately 2.5 cm greatest
dimension, with associated suspicious pleomorphic and coarse
heterogeneous calcifications, corresponding to the palpable area of
concern.

There are multiple additional irregular masses with associated
architectural distortion located within the upper-outer quadrant of
the RIGHT breast, difficult to measure individually but overall
measuring approximately 7 cm AP dimension and approximately 5 cm
transverse dimension, all also associated with suspicious
pleomorphic and coarse heterogeneous calcifications.

There are additional suspicious fine pleomorphic calcifications at
the nipple and within the immediate subareolar RIGHT breast,
measuring 1.4 cm extent.

LEFT breast: There are no dominant masses, suspicious calcifications
or secondary signs of malignancy within the LEFT breast.

Targeted ultrasound is performed, showing an irregular hypoechoic
mass in the RIGHT breast at the 12-1 o'clock axis, 6 cm from the
nipple, measuring 4.5 x 1.6 x 2.8 cm, corresponding to the palpable
area of concern.

There are additional contiguous and near contiguous masses within
the outer RIGHT breast, 9-11 o'clock axes, 3-5 cm from the nipple,
difficult to measure sonographically, corresponding to the greater
than 7 cm extent seen mammographically.

RIGHT axilla was evaluated showing 2 enlarged/morphologically
abnormal lymph nodes, with cortical thickness up to 5 mm.
IMPRESSION: 1. Highly suspicious mass within the RIGHT breast at the 12-1
o'clock axis, 6 cm from the nipple, measuring 4.5 cm, with
associated pleomorphic microcalcifications, corresponding to the
palpable area of concern.
2. Multiple additional highly suspicious masses within the outer
RIGHT breast, extending from the 9-11 o'clock axes, extending 3-5 cm
from the nipple, contiguous and near contiguous throughout and
therefore difficult to measure sonographically, but corresponding to
the greater than 7 cm extent seen mammographically, also with
associated pleomorphic calcifications, highly suspicious for
additional multifocal/multicentric disease.
3. Two enlarged lymph nodes in the RIGHT axilla, highly suspicious
for metastatic lymphadenopathy.
4. Additional fine pleomorphic calcifications within the subareolar
RIGHT breast extending into the nipple, measuring 1.4 cm extent,
suspicious for additional multicentric disease.
5. No evidence of malignancy within the LEFT breast.

RECOMMENDATION:
1. Ultrasound-guided biopsies (2 sites) to include the palpable mass
in the RIGHT breast at the 12-1 o'clock axis (measuring 4.5 cm) and
the anterior aspect of the contiguous masses closest to the RIGHT
nipple.
2. Ultrasound-guided biopsy of 1 of the enlarged lymph nodes in the
RIGHT axilla
3. If findings are confirmed to be malignancy, and if breast
conservation is considered, central duct excision would be
eventually required for the additional suspicious calcifications in
the RIGHT nipple and adjacent immediate subareolar RIGHT breast.

Ultrasound-guided biopsies will be scheduled at patient's
convenience.

I have discussed the findings and recommendations with the patient.
If applicable, a reminder letter will be sent to the patient
regarding the next appointment.

BI-RADS CATEGORY  5: Highly suggestive of malignancy.

## 2021-10-10 ENCOUNTER — Ambulatory Visit: Payer: Federal, State, Local not specified - PPO

## 2021-10-10 ENCOUNTER — Ambulatory Visit: Payer: Federal, State, Local not specified - PPO | Admitting: Radiation Oncology

## 2021-10-10 ENCOUNTER — Encounter: Payer: Self-pay | Admitting: *Deleted

## 2021-10-10 IMAGING — MG MM BREAST LOCALIZATION CLIP
8 series · 9 of 24 positions shown · non-contrast
Comparison: Previous exam(s).

CLINICAL DATA: Post procedure mammogram for clip placement.

EXAM:
DIAGNOSTIC RIGHT MAMMOGRAM POST ULTRASOUND BIOPSY

[R XCCL synth-2D]
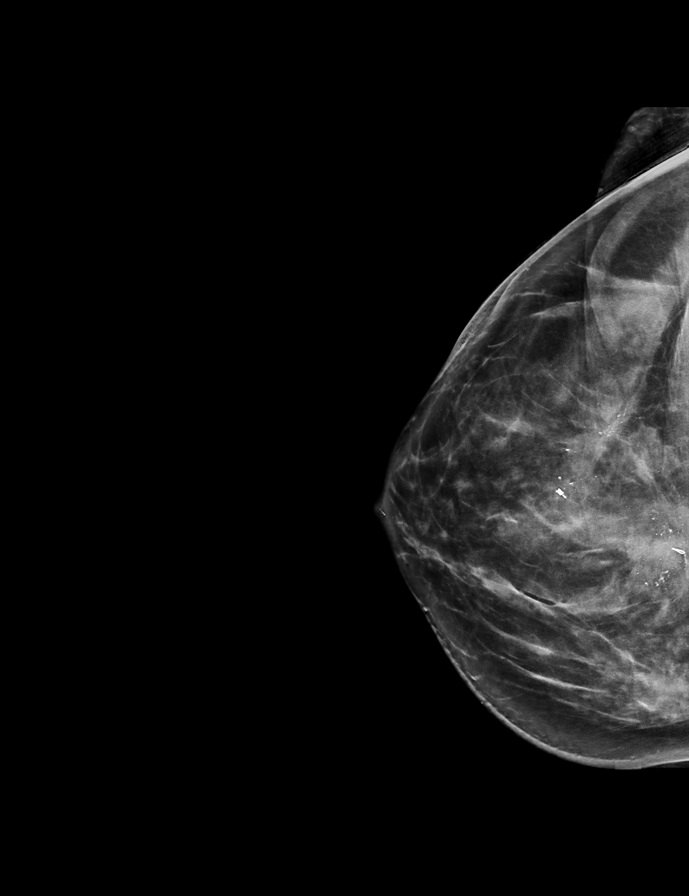

[R CC synth-2D]
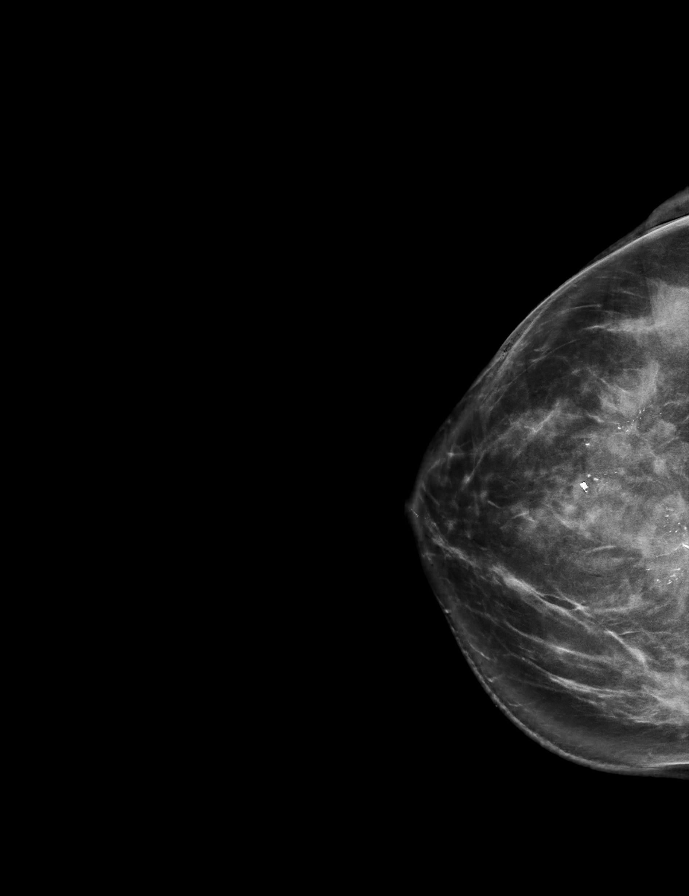

[R ML synth-2D]
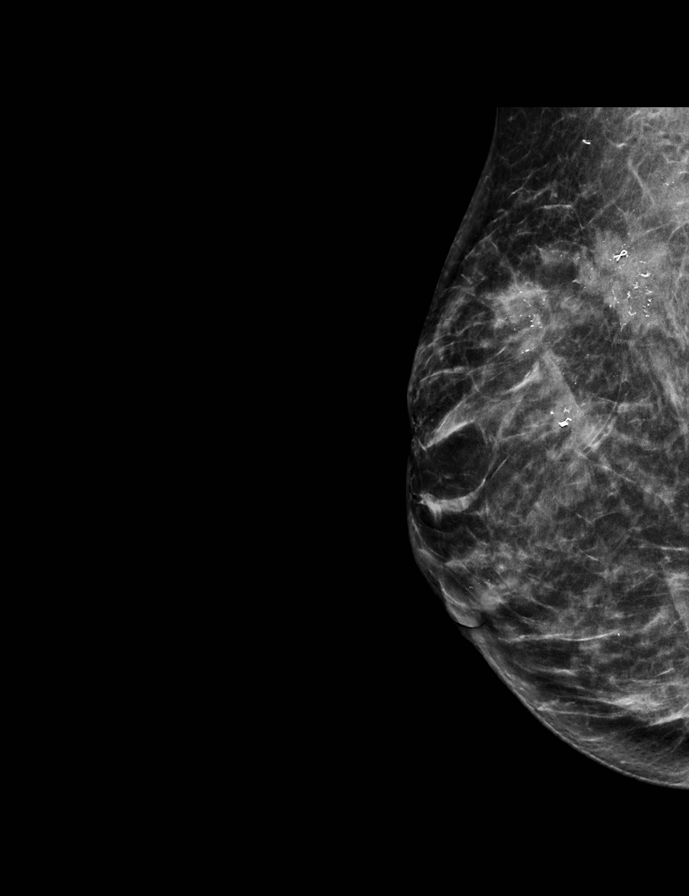

[R AT synth-2D]
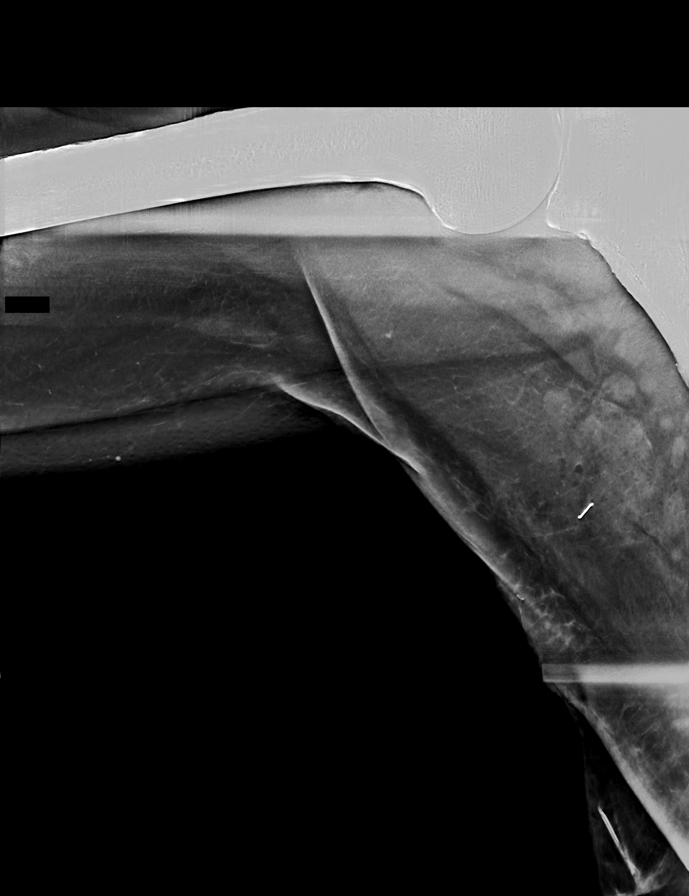

[R CC tomo · 2 of 74 frames shown]
[frame 24/74]
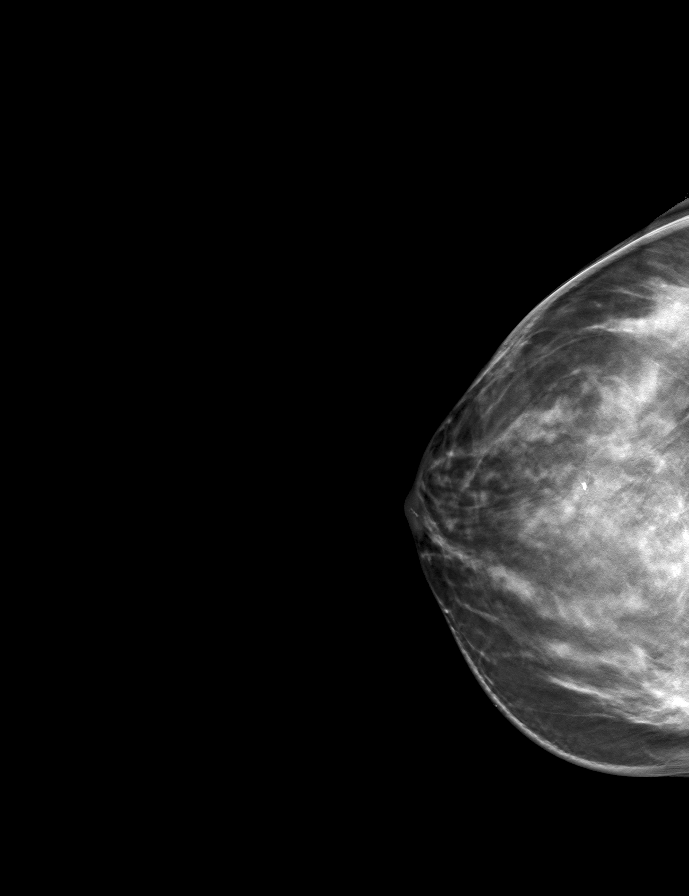
[frame 37/74]
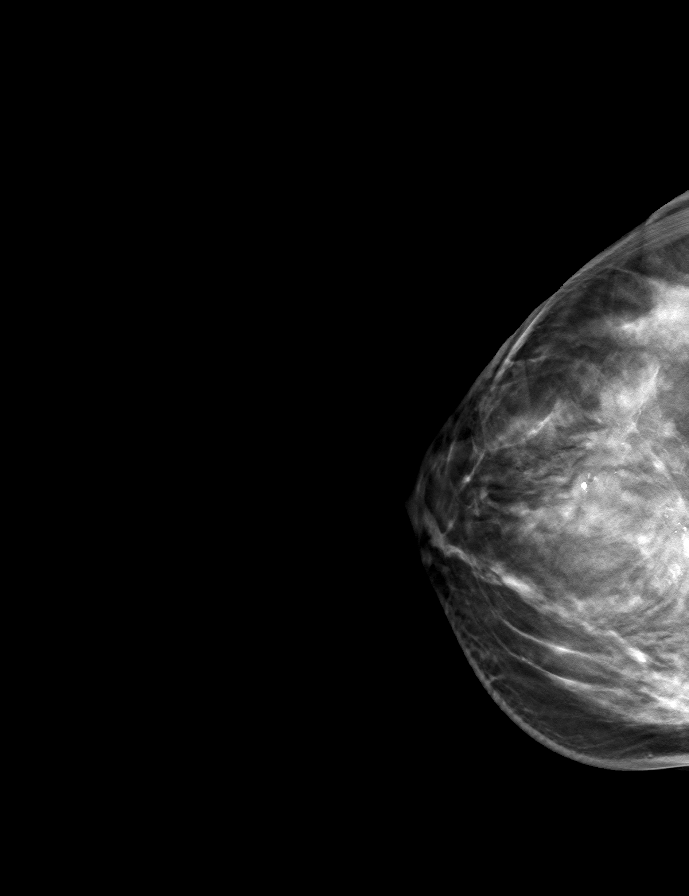

[R XCCL tomo · tomo slice 35/69.0]
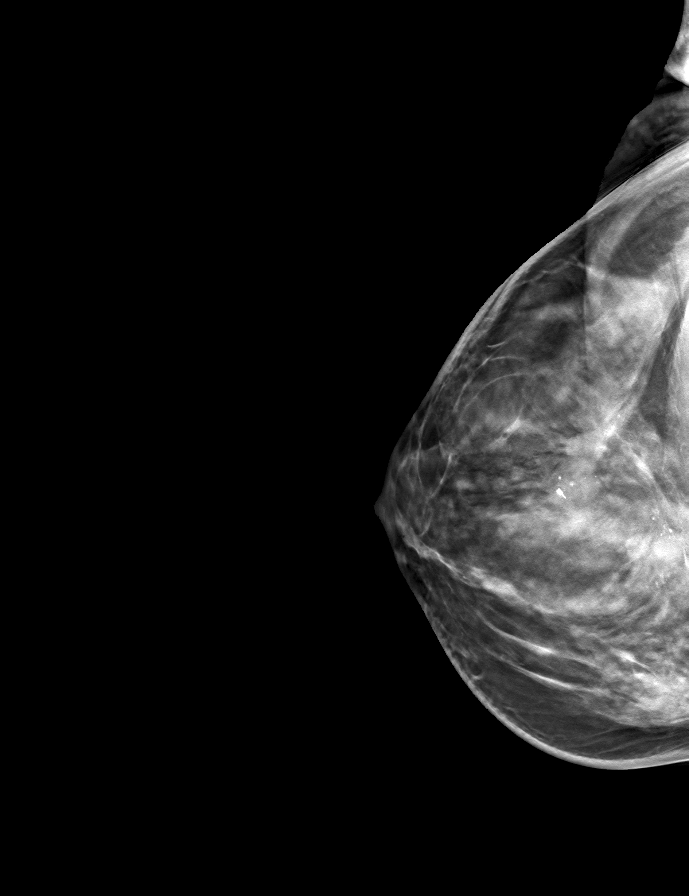

[R ML tomo · tomo slice 29/58.0]
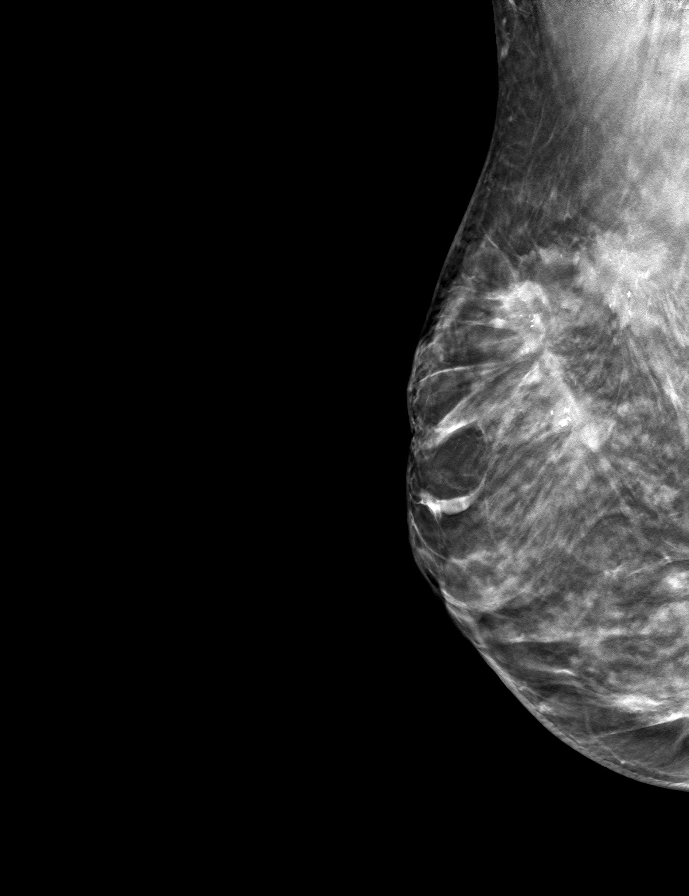

[R AT tomo · tomo slice 49/96.0]
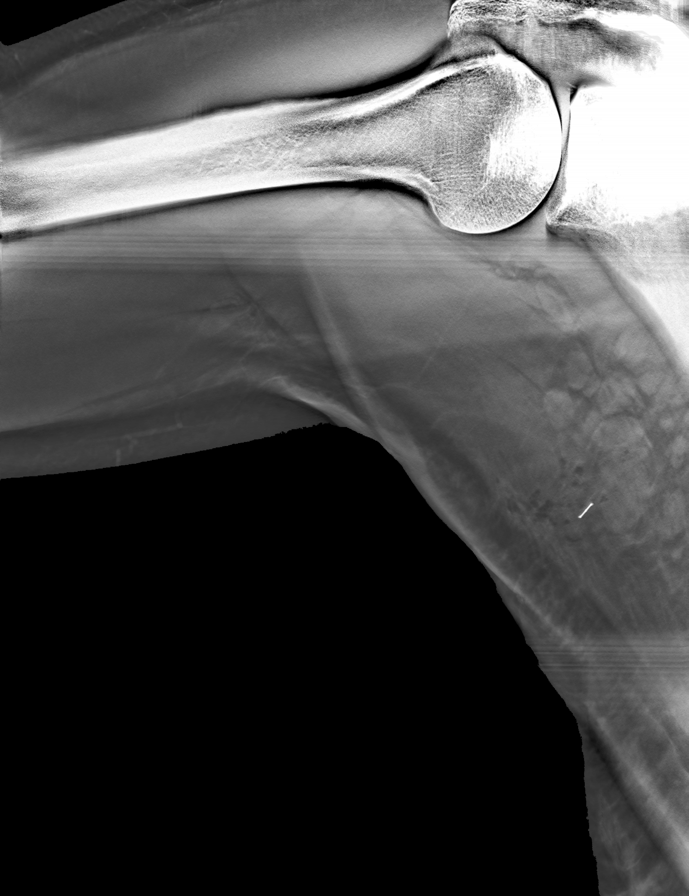

[9 of 24 positions shown; findings below may reference images not displayed]

FINDINGS: Mammographic images were obtained following ultrasound guided biopsy
of a mass in the right breast at 1 o'clock. The ribbon biopsy
marking clip is in expected position at the site of biopsy.

Mammographic images were obtained following ultrasound guided biopsy
of a mass in the right breast at 11 o'clock. The coil biopsy marking
clip is in expected position at the site of biopsy.

Mammographic images were obtained following ultrasound guided biopsy
of a right axillary lymph node. The tribell biopsy marking clip is
in expected position at the site of biopsy.
IMPRESSION: Appropriate positioning of the ribbon shaped biopsy marking clip at
the site of biopsy in the right breast 1 o'clock.

Appropriate positioning of the coil shaped biopsy marking clip at
the site of biopsy in the right breast at 11 o'clock.

Appropriate positioning of the tribell shaped biopsy marking clip at
the site of biopsy in the right axilla.

Final Assessment: Post Procedure Mammograms for Marker Placement

## 2021-10-10 IMAGING — US US  BREAST BX W/ LOC DEV 1ST LESION IMG BX SPEC US GUIDE*R*
1 series · 16 of 25 positions shown · non-contrast
Comparison: Previous exam(s).
COMPARISON: Previous exam(s).

Addendum:
CLINICAL DATA: 39-year-old female presenting for biopsy of masses
in the right breast and a lymph node.

EXAM:
US RIGHT BREAST CORE NEEDLE BIOPSY X2
US AXILLARY NODE CORE BIOPSY RIGHT

[Series 1: us breast bx w/ loc dev 1st lesion img bx spec us  · 0.07mm/px · 16 of 29 slices shown]
[im 1/29]
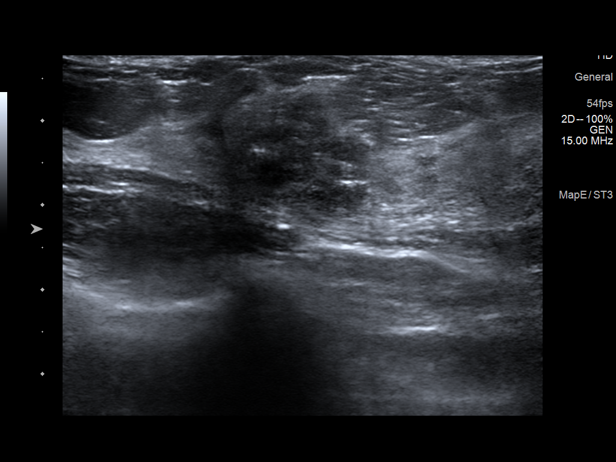
[im 3/29]
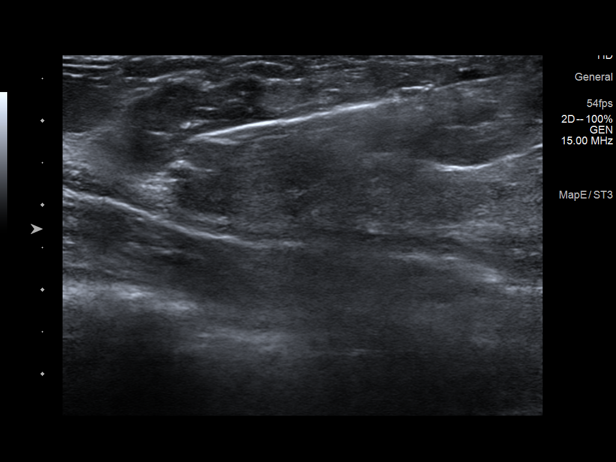
[im 4/29]
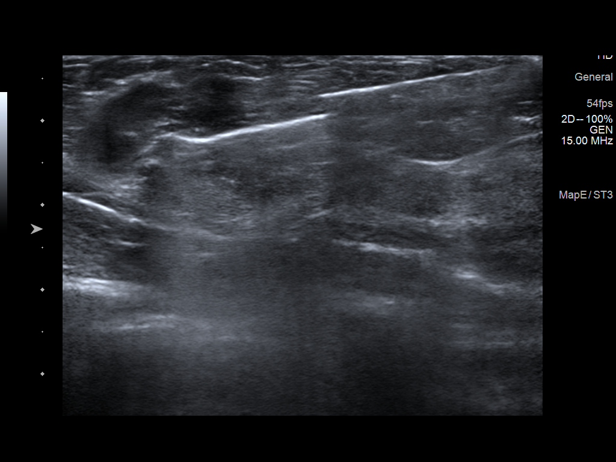
[im 6/29]
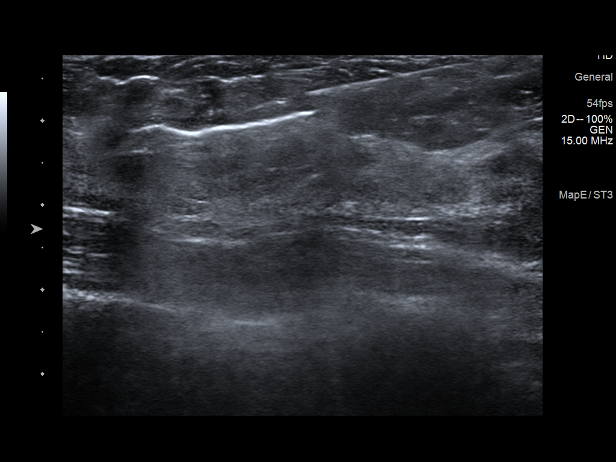
[im 9/29]
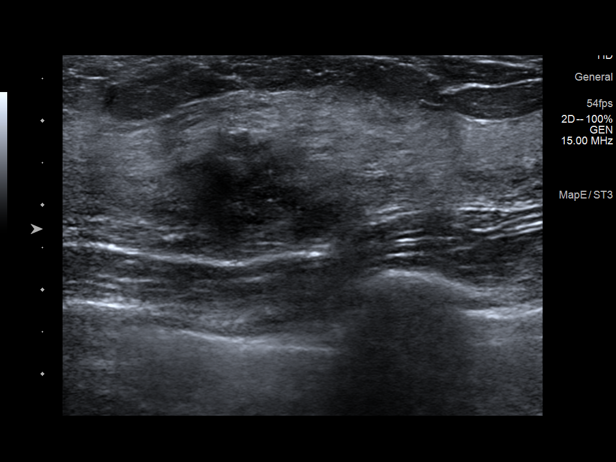
[im 10/29]
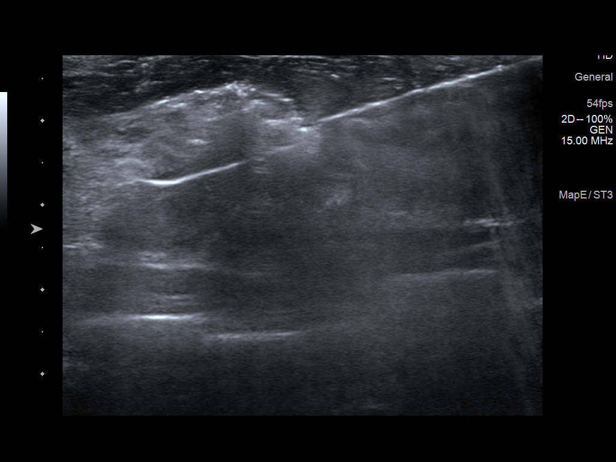
[im 12/29]
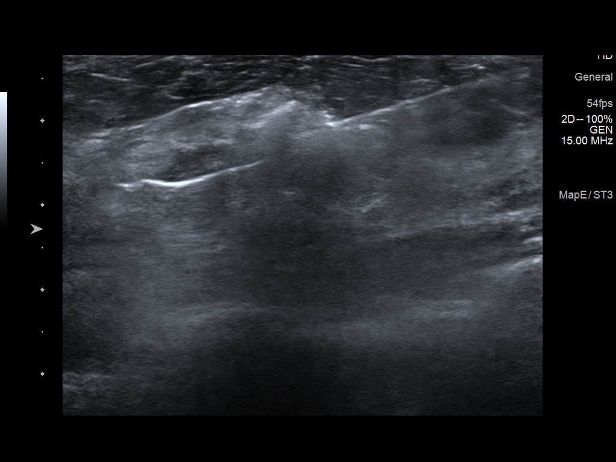
[im 13/29]
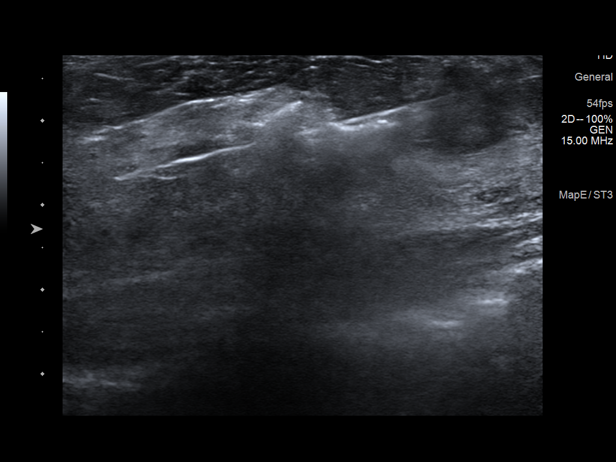
[im 16/29]
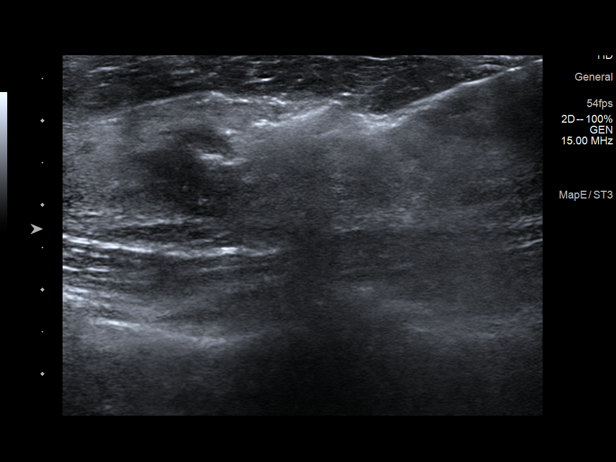
[im 17/29]
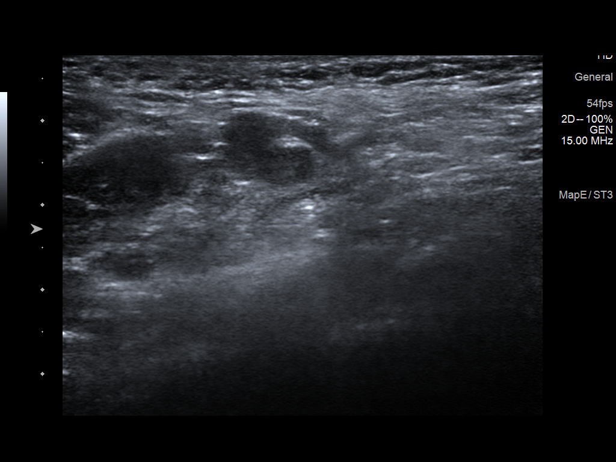
[im 19/29]
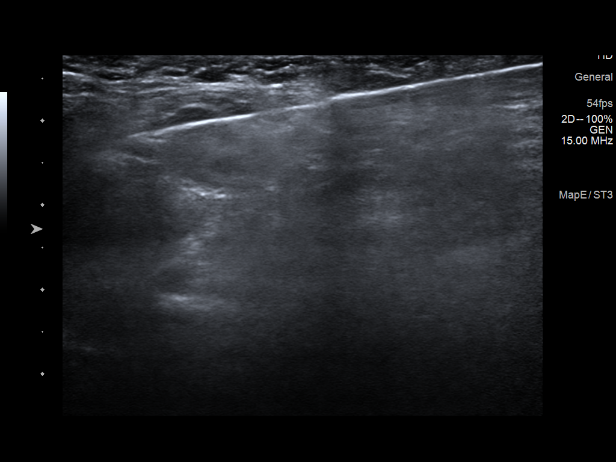
[im 20/29]
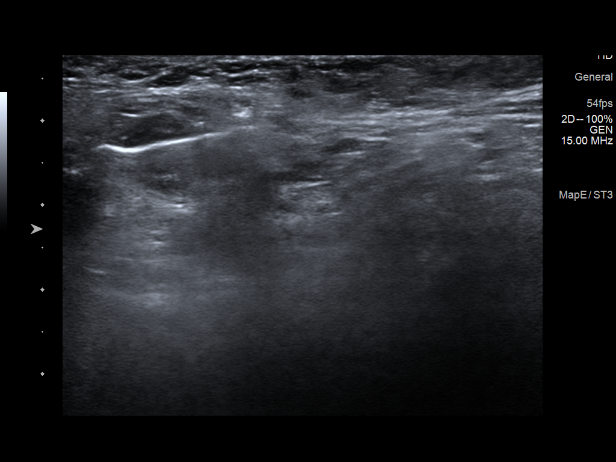
[im 23/29]
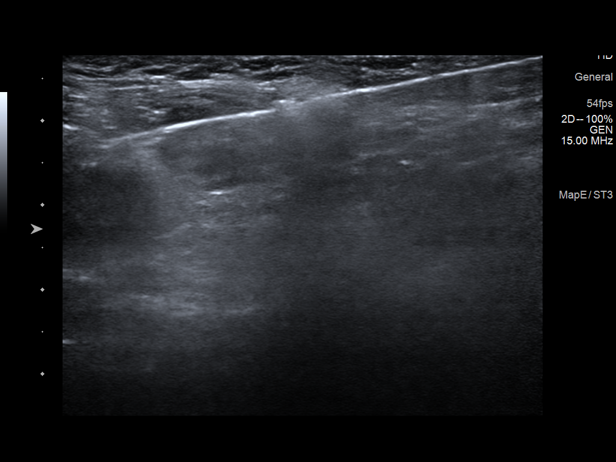
[im 25/29]
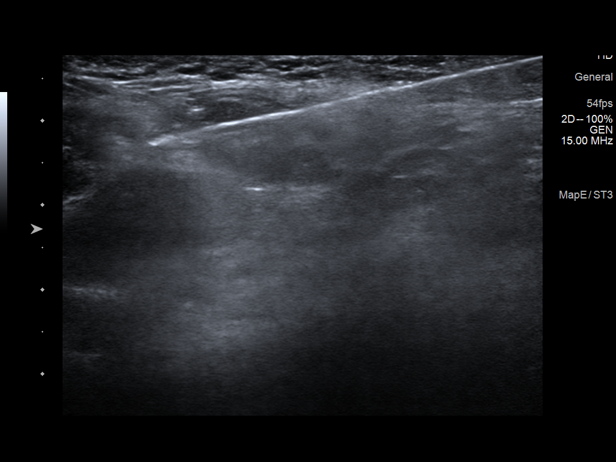
[im 26/29]
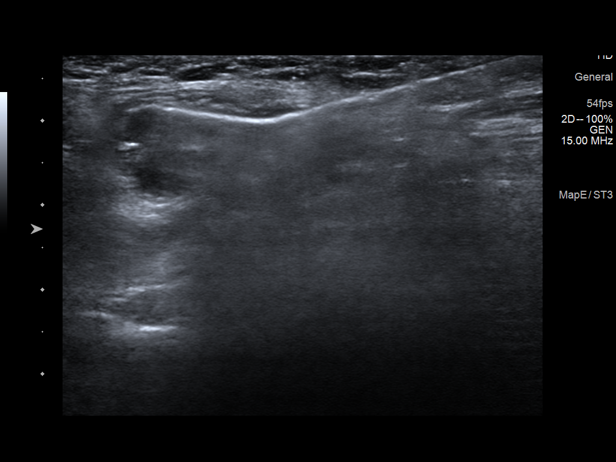
[im 29/29]
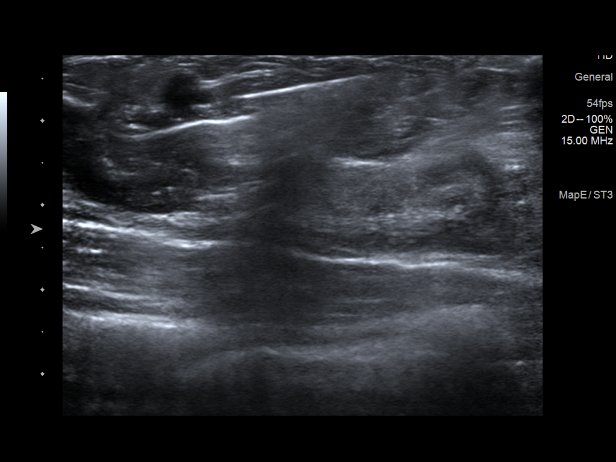

[16 of 25 positions shown; findings below may reference images not displayed]



1.  Quadrant: Upper Inner

Using sterile technique and 1% Lidocaine as local anesthetic, under
direct ultrasound visualization, a 12 gauge Oxendine device was
used to perform biopsy of a mass in the right breast at 1 o'clock
using a lateral approach. At the conclusion of the procedure a
ribbon tissue marker clip was deployed into the biopsy cavity.

2.  Quadrant: Upper outer

Using sterile technique and 1% Lidocaine as local anesthetic, under
direct ultrasound visualization, a 12 gauge Oxendine device was
used to perform biopsy of a mass in the right breast at 11 o'clock
using a lateral approach. At the conclusion of the procedure a coil
tissue marker clip was deployed into the biopsy cavity.

3.  Axilla

Using sterile technique and 1% Lidocaine as local anesthetic, under
direct ultrasound visualization, a 14 gauge Oxendine device was
used to perform biopsy of a right axillary lymph node using a
inferior approach. At the conclusion of the procedure a tribell
tissue marker clip was deployed into the biopsy cavity.

Follow up 2 view mammogram was performed and dictated separately.
IMPRESSION: Ultrasound guided biopsy of a mass in the right breast at 1 o'clock,
of a mass in the right breast at 11 o'clock, and of a right axillary
lymph node. No apparent complications.

ADDENDUM:
Pathology revealed GRADE II INVASIVE DUCTAL CARCINOMA, DUCTAL
CARCINOMA IN SITU of the RIGHT breast, 1 o'clock (ribbon clip). This
was found to be concordant by Dr. Elgentel Hg.

Pathology revealed GRADE II INVASIVE DUCTAL CARCINOMA, DUCTAL
CARCINOMA IN SITU of the RIGHT breast, 11 o'clock (coil clip). This
was found to be concordant by Dr. Elgentel Hg.

Pathology revealed METASTATIC CARCINOMA of the RIGHT axillary lymph
node (tribell clip). This was found to be concordant by Dr. Freddie
[place].

Pathology results were discussed with the patient by telephone. The
patient reported doing well after the biopsies with tenderness at
the sites. Post biopsy instructions and care were reviewed and
questions were answered. The patient was encouraged to call The

Surgical consultation has been arranged with Dr. Ti Patron Simiette
consultation has been arranged with Dr. Leng Dehart at [REDACTED] on April 13, 2021.

MRI recommended for extent of disease.

Pathology results reported by Khari Olmedo RN on 04/12/2021.



1.  Quadrant: Upper Inner

Using sterile technique and 1% Lidocaine as local anesthetic, under
direct ultrasound visualization, a 12 gauge Oxendine device was
used to perform biopsy of a mass in the right breast at 1 o'clock
using a lateral approach. At the conclusion of the procedure a
ribbon tissue marker clip was deployed into the biopsy cavity.

2.  Quadrant: Upper outer

Using sterile technique and 1% Lidocaine as local anesthetic, under
direct ultrasound visualization, a 12 gauge Oxendine device was
used to perform biopsy of a mass in the right breast at 11 o'clock
using a lateral approach. At the conclusion of the procedure a coil
tissue marker clip was deployed into the biopsy cavity.

3.  Axilla

Using sterile technique and 1% Lidocaine as local anesthetic, under
direct ultrasound visualization, a 14 gauge Oxendine device was
used to perform biopsy of a right axillary lymph node using a
inferior approach. At the conclusion of the procedure a tribell
tissue marker clip was deployed into the biopsy cavity.

Follow up 2 view mammogram was performed and dictated separately.
IMPRESSION: Ultrasound guided biopsy of a mass in the right breast at 1 o'clock,
of a mass in the right breast at 11 o'clock, and of a right axillary
lymph node. No apparent complications.

## 2021-10-11 ENCOUNTER — Telehealth: Payer: Self-pay | Admitting: Hematology and Oncology

## 2021-10-11 NOTE — Telephone Encounter (Signed)
Scheduled per sch msg. Called and spoke with patient. Confirmed appt  

## 2021-10-14 ENCOUNTER — Encounter (HOSPITAL_BASED_OUTPATIENT_CLINIC_OR_DEPARTMENT_OTHER): Payer: Self-pay | Admitting: General Surgery

## 2021-10-14 ENCOUNTER — Other Ambulatory Visit: Payer: Self-pay

## 2021-10-17 ENCOUNTER — Encounter: Payer: Self-pay | Admitting: *Deleted

## 2021-10-17 IMAGING — MR MR BREAST BILAT WO/W CM
8 of 12 series · 32 of 48 positions shown · IV contrast (6ml gadavist)
Comparison: Previous exam(s).

CLINICAL DATA: 39-year-old female with right breast biopsy proven
grade ll invasive ductal carcinoma, ductal carcinoma in-situ in the
1 o'clock region (ribbon clip) and grade ll invasive ductal
carcinoma, ductal carcinoma in-situ in the 11 o'clock (coil clip)
region. Metastatic carcinoma in a right axillary lymph node (tribell
clip).

LABS:  None obtained at the time of imaging.
EXAM:
BILATERAL BREAST MRI WITH AND WITHOUT CONTRAST
TECHNIQUE: Multiplanar, multisequence MR images of both breasts were obtained
prior to and following the intravenous administration of 6 ml of
Gadavist

[Series 2: t2_tirm_tra ipat (a-p) · axial · 3.0mm · 0.66mm/px · 1 of 53 slices shown]
[im 1/53]
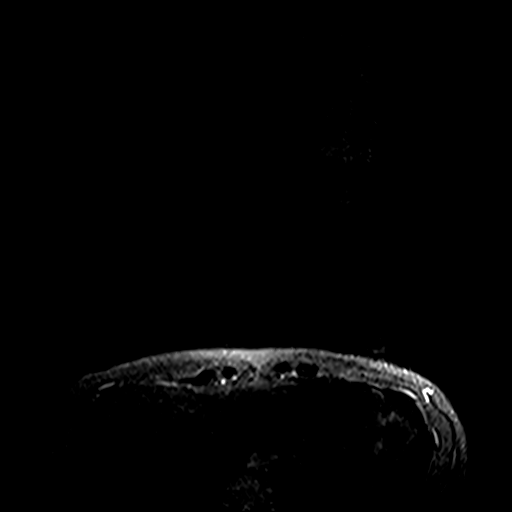

[Series 3: fl3d pre-cm no · axial · non-contrast · 1.2mm · 0.89mm/px · z∈[-92,+79]mm · 5 of 144 slices shown]
[im 1/144]
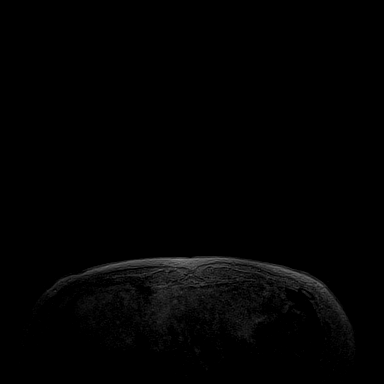
[im 36/144]
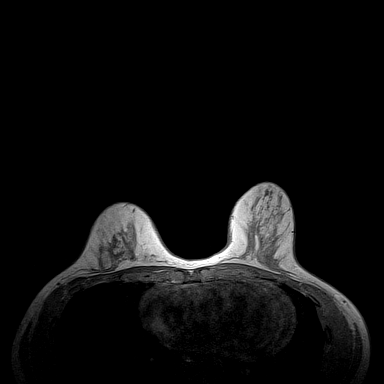
[im 72/144]
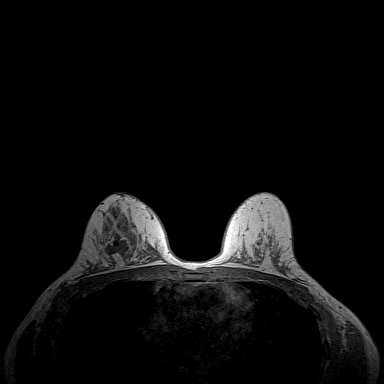
[im 108/144]
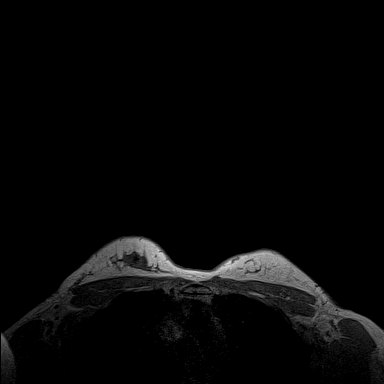
[im 144/144]
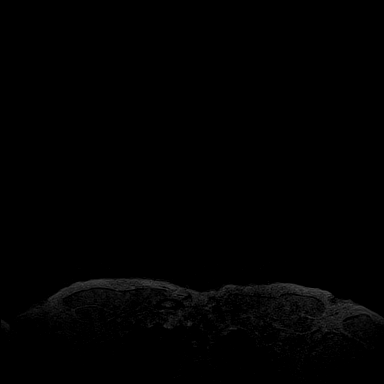

[Series 4: fl3d pre-cm · axial · non-contrast · 1.2mm · 0.89mm/px · z∈[-92,+79]mm · 5 of 144 slices shown]
[im 1/144]
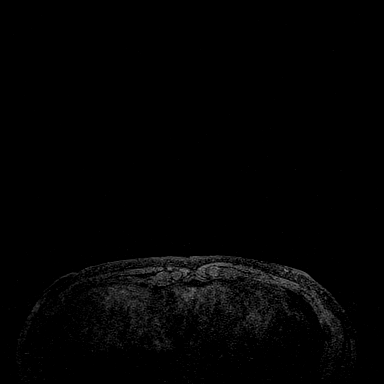
[im 36/144]
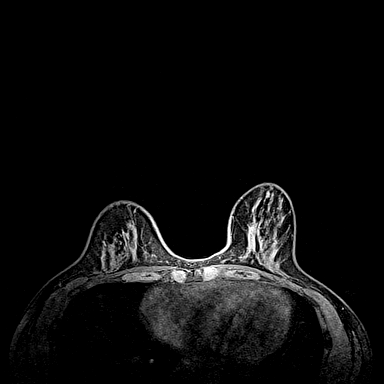
[im 72/144]
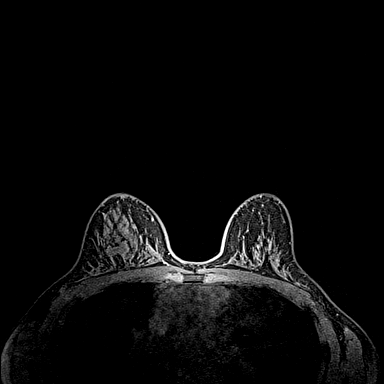
[im 108/144]
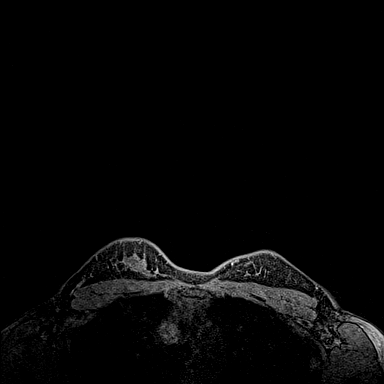
[im 144/144]
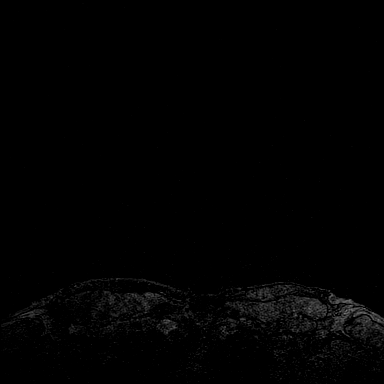

[Series 5: fl3d post-cm 20 · axial · 1.2mm · 0.89mm/px · z∈[-92,+79]mm · 5 of 144 slices shown (1 of 3)]
[im 1/144]
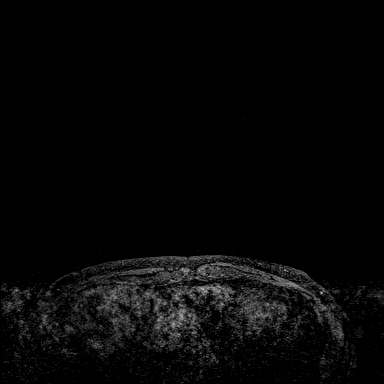
[im 36/144]
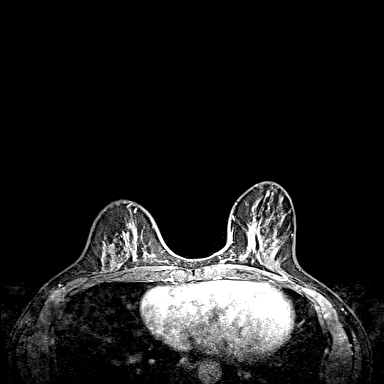
[im 72/144]
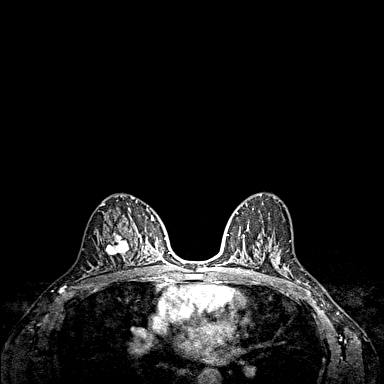
[im 108/144]
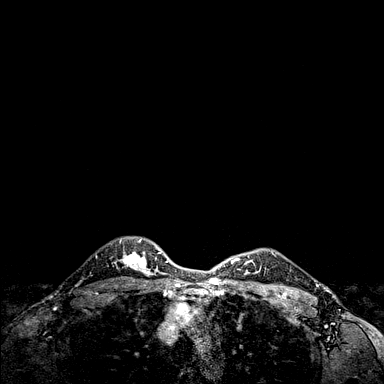
[im 144/144]
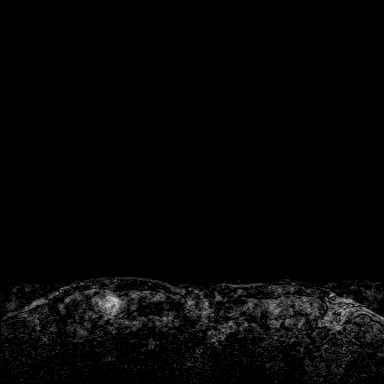

[Series 6: fl3d post-cm 20 · axial · 1.2mm · 0.89mm/px · z∈[-92,+79]mm · 5 of 144 slices shown (2 of 3)]
[im 1/144]
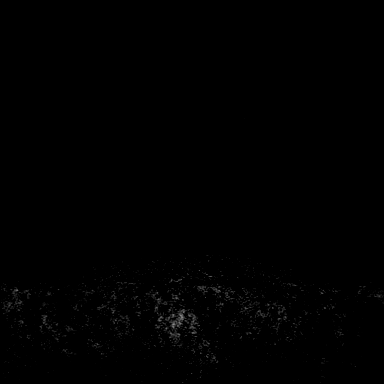
[im 36/144]
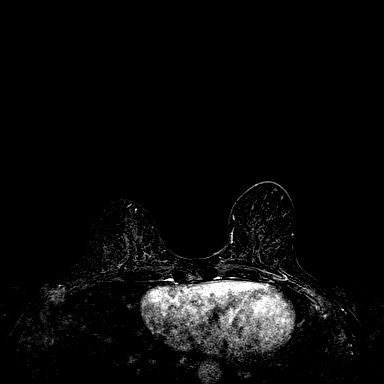
[im 72/144]
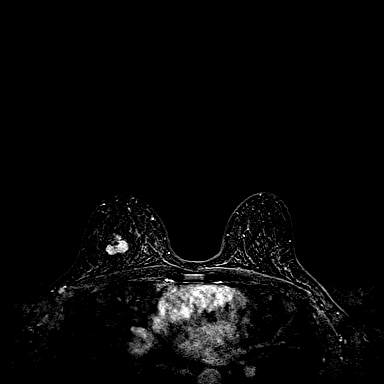
[im 108/144]
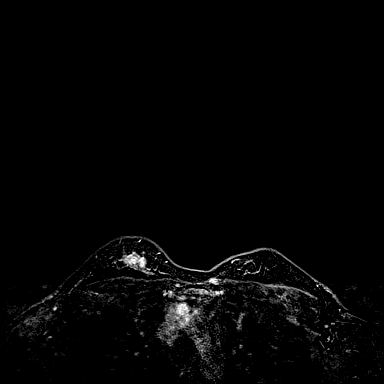
[im 144/144]
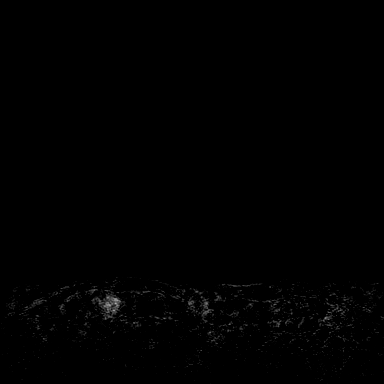

[Series 7: fl3d post-cm 20 · axial · 172.8mm · 0.89mm/px · 1 of 1 slices shown (3 of 3)]
[im 1/1]
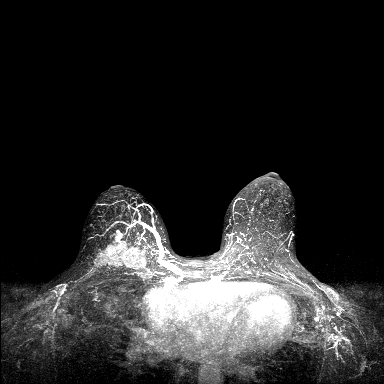

[Series 8: fl3d post-cm 3min · axial · 1.2mm · 0.89mm/px · z∈[-92,+79]mm · 6 of 144 slices shown]
[im 1/144]
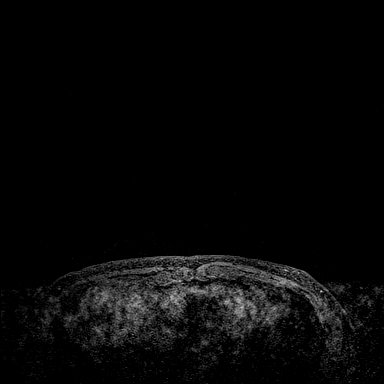
[im 29/144]
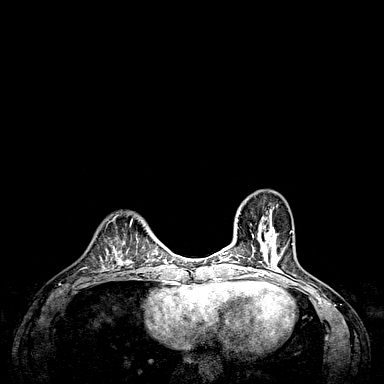
[im 58/144]
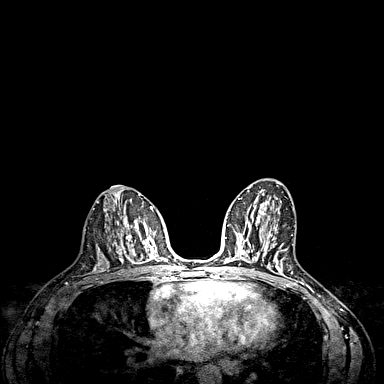
[im 86/144]
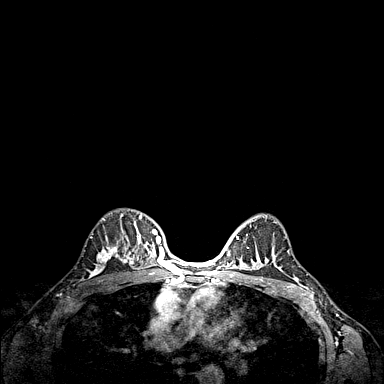
[im 115/144]
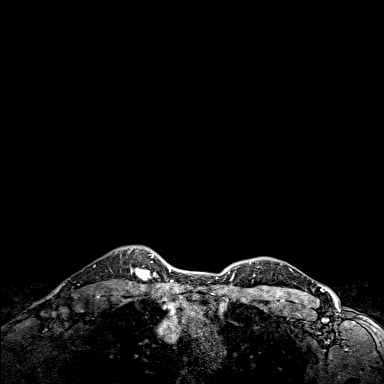
[im 144/144]
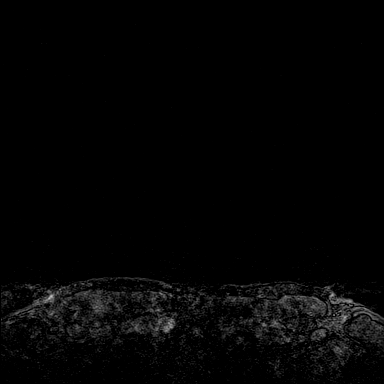

[Series 9: fl3d post-cm 3min_sub · axial · 1.2mm · 0.89mm/px · z∈[-92,+10]mm · 4 of 144 slices shown]
[im 1/144]
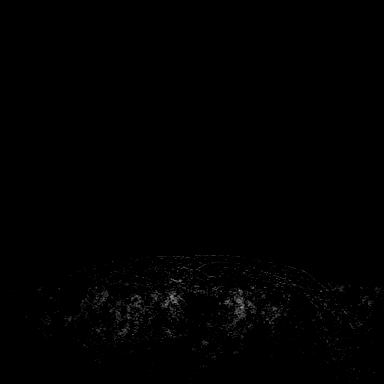
[im 29/144]
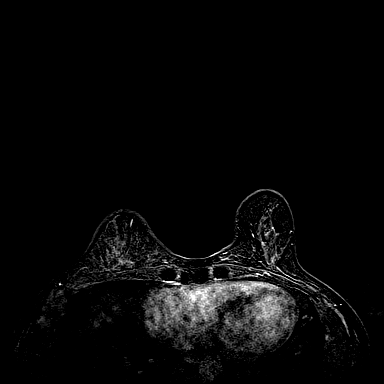
[im 58/144]
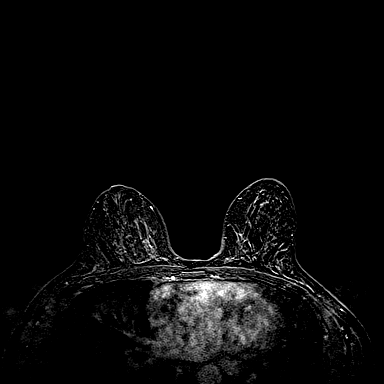
[im 86/144]
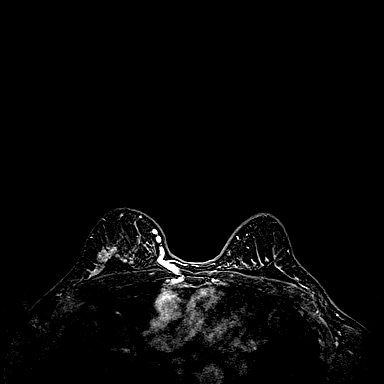

[32 of 48 positions shown; findings below may reference images not displayed]

Three-dimensional MR images were rendered by post-processing of the
original MR data on an independent workstation. The
three-dimensional MR images were interpreted, and findings are
reported in the following complete MRI report for this study. Three
dimensional images were evaluated at the independent interpreting
workstation using the DynaCAD thin client.
FINDINGS: Breast composition: c. Heterogeneous fibroglandular tissue.

Background parenchymal enhancement: Moderate.

Right breast: In the posterior aspect of the upper right breast
there is a large lobulated enhancing mass abutting the pectoralis
muscle (series 12 image 43) measuring 4.9 x 2.6 x 5.7 cm in
transverse, anterior posterior and cranial caudal dimensions. Two
signal void artifacts are seen in the mass from the biopsy clips.

Left breast: No mass or abnormal enhancement.

Lymph nodes: 2 enlarged right axillary lymph node measuring 1.4 and
1.4 cm (series number 9, image 28). One of the lymph nodes contains
a signal void artifact from the biopsy clip.

Ancillary findings:  None.
IMPRESSION: 5.7 cm lobulated mass in the superior aspect of the right breast and
enlarged right axillary adenopathy corresponding with the biopsy
proven right breast cancer and metastatic adenopathy.

RECOMMENDATION:
Treatment planning of the biopsy proven right breast cancer and
metastatic adenopathy is recommended.

BI-RADS CATEGORY  6: Known biopsy-proven malignancy.

## 2021-10-18 ENCOUNTER — Ambulatory Visit
Admission: RE | Admit: 2021-10-18 | Discharge: 2021-10-18 | Disposition: A | Discharge status: Home / Self Care | Payer: Federal, State, Local not specified - PPO | Source: Ambulatory Visit | Attending: General Surgery | Admitting: General Surgery

## 2021-10-18 ENCOUNTER — Other Ambulatory Visit: Payer: Self-pay

## 2021-10-18 ENCOUNTER — Other Ambulatory Visit: Payer: Self-pay | Admitting: General Surgery

## 2021-10-18 DIAGNOSIS — Z170 Estrogen receptor positive status [ER+]: Secondary | ICD-10-CM

## 2021-10-18 DIAGNOSIS — C50211 Malignant neoplasm of upper-inner quadrant of right female breast: Secondary | ICD-10-CM

## 2021-10-18 MED ORDER — ENSURE PRE-SURGERY PO LIQD: 296.0000 mL | Freq: Once | ORAL | Status: DC

## 2021-10-18 NOTE — Progress Notes (Signed)

## 2021-10-21 ENCOUNTER — Encounter (HOSPITAL_BASED_OUTPATIENT_CLINIC_OR_DEPARTMENT_OTHER): Payer: Self-pay | Admitting: General Surgery

## 2021-10-21 ENCOUNTER — Ambulatory Visit
Admission: RE | Admit: 2021-10-21 | Discharge: 2021-10-21 | Disposition: A | Discharge status: Home / Self Care | Payer: Federal, State, Local not specified - PPO | Source: Ambulatory Visit | Attending: General Surgery | Admitting: General Surgery

## 2021-10-21 ENCOUNTER — Ambulatory Visit (HOSPITAL_BASED_OUTPATIENT_CLINIC_OR_DEPARTMENT_OTHER): Payer: Federal, State, Local not specified - PPO | Admitting: Anesthesiology

## 2021-10-21 ENCOUNTER — Observation Stay (HOSPITAL_BASED_OUTPATIENT_CLINIC_OR_DEPARTMENT_OTHER)
Admission: RE | Admit: 2021-10-21 | Discharge: 2021-10-22 | Disposition: A | Discharge status: Home / Self Care | Payer: Federal, State, Local not specified - PPO | Attending: General Surgery | Admitting: General Surgery

## 2021-10-21 ENCOUNTER — Encounter (HOSPITAL_BASED_OUTPATIENT_CLINIC_OR_DEPARTMENT_OTHER)
Admission: RE | Disposition: A | Discharge status: Home / Self Care | Payer: Self-pay | Source: Home / Self Care | Attending: General Surgery

## 2021-10-21 ENCOUNTER — Other Ambulatory Visit: Payer: Self-pay

## 2021-10-21 DIAGNOSIS — Z9221 Personal history of antineoplastic chemotherapy: Secondary | ICD-10-CM | POA: Insufficient documentation

## 2021-10-21 DIAGNOSIS — Z9013 Acquired absence of bilateral breasts and nipples: Secondary | ICD-10-CM

## 2021-10-21 DIAGNOSIS — Z170 Estrogen receptor positive status [ER+]: Secondary | ICD-10-CM

## 2021-10-21 DIAGNOSIS — F17210 Nicotine dependence, cigarettes, uncomplicated: Secondary | ICD-10-CM | POA: Diagnosis not present

## 2021-10-21 DIAGNOSIS — D0511 Intraductal carcinoma in situ of right breast: Principal | ICD-10-CM | POA: Insufficient documentation

## 2021-10-21 HISTORY — PX: TOTAL MASTECTOMY: SHX6129

## 2021-10-21 HISTORY — PX: RADIOACTIVE SEED GUIDED AXILLARY SENTINEL LYMPH NODE: SHX6735

## 2021-10-21 HISTORY — PX: MASTECTOMY W/ SENTINEL NODE BIOPSY: SHX2001

## 2021-10-21 HISTORY — PX: PORT-A-CATH REMOVAL: SHX5289

## 2021-10-21 LAB — POCT PREGNANCY, URINE: Preg Test, Ur: NEGATIVE

## 2021-10-21 SURGERY — MASTECTOMY WITH SENTINEL LYMPH NODE BIOPSY
Anesthesia: General | Site: Chest | Laterality: Right

## 2021-10-21 MED ORDER — BUPIVACAINE-EPINEPHRINE (PF) 0.25% -1:200000 IJ SOLN
INTRAMUSCULAR | Status: DC | PRN
  Administered 2021-10-21 (×2): 30 mL

## 2021-10-21 MED ORDER — FENTANYL CITRATE (PF) 100 MCG/2ML IJ SOLN
INTRAMUSCULAR | Status: DC | PRN
  Administered 2021-10-21 (×2): 50 ug via INTRAVENOUS
  Administered 2021-10-21: 100 ug via INTRAVENOUS
  Administered 2021-10-21: 50 ug via INTRAVENOUS

## 2021-10-21 MED ORDER — FENTANYL CITRATE (PF) 100 MCG/2ML IJ SOLN
INTRAMUSCULAR | Status: AC
  Filled 2021-10-21: qty 2

## 2021-10-21 MED ORDER — LIDOCAINE 2% (20 MG/ML) 5 ML SYRINGE
INTRAMUSCULAR | Status: AC
  Filled 2021-10-21: qty 5

## 2021-10-21 MED ORDER — CEFAZOLIN SODIUM-DEXTROSE 2-4 GM/100ML-% IV SOLN
INTRAVENOUS | Status: AC
  Filled 2021-10-21: qty 100

## 2021-10-21 MED ORDER — OXYCODONE HCL 5 MG PO TABS: 5.0000 mg | ORAL_TABLET | Freq: Once | ORAL | Status: DC | PRN

## 2021-10-21 MED ORDER — MIDAZOLAM HCL 5 MG/5ML IJ SOLN
INTRAMUSCULAR | Status: DC | PRN
  Administered 2021-10-21: 2 mg via INTRAVENOUS

## 2021-10-21 MED ORDER — ACETAMINOPHEN 160 MG/5ML PO SOLN: 325.0000 mg | ORAL | Status: DC | PRN

## 2021-10-21 MED ORDER — FENTANYL CITRATE (PF) 100 MCG/2ML IJ SOLN
25.0000 ug | INTRAMUSCULAR | Status: DC | PRN
  Administered 2021-10-21 (×2): 50 ug via INTRAVENOUS

## 2021-10-21 MED ORDER — KETOROLAC TROMETHAMINE 15 MG/ML IJ SOLN
15.0000 mg | INTRAMUSCULAR | Status: AC
  Administered 2021-10-21: 15 mg via INTRAVENOUS

## 2021-10-21 MED ORDER — SCOPOLAMINE 1 MG/3DAYS TD PT72
MEDICATED_PATCH | TRANSDERMAL | Status: AC
  Filled 2021-10-21: qty 1

## 2021-10-21 MED ORDER — LACTATED RINGERS IV SOLN: INTRAVENOUS | Status: DC

## 2021-10-21 MED ORDER — MIDAZOLAM HCL 2 MG/2ML IJ SOLN
INTRAMUSCULAR | Status: AC
  Filled 2021-10-21: qty 2

## 2021-10-21 MED ORDER — BUPIVACAINE-EPINEPHRINE (PF) 0.25% -1:200000 IJ SOLN: INTRAMUSCULAR | Status: DC | PRN

## 2021-10-21 MED ORDER — MORPHINE SULFATE (PF) 4 MG/ML IV SOLN: 2.0000 mg | INTRAVENOUS | Status: DC | PRN

## 2021-10-21 MED ORDER — ACETAMINOPHEN 500 MG PO TABS
ORAL_TABLET | ORAL | Status: AC
  Filled 2021-10-21: qty 2

## 2021-10-21 MED ORDER — OXYCODONE HCL 5 MG PO TABS
5.0000 mg | ORAL_TABLET | ORAL | Status: DC | PRN
  Administered 2021-10-21: 5 mg via ORAL
  Filled 2021-10-21: qty 1

## 2021-10-21 MED ORDER — MIDAZOLAM HCL 2 MG/2ML IJ SOLN
2.0000 mg | Freq: Once | INTRAMUSCULAR | Status: AC
  Administered 2021-10-21: 2 mg via INTRAVENOUS

## 2021-10-21 MED ORDER — MAGTRACE LYMPHATIC TRACER
INTRAMUSCULAR | Status: DC | PRN
  Administered 2021-10-21: 2 mL via INTRAMUSCULAR

## 2021-10-21 MED ORDER — ACETAMINOPHEN 10 MG/ML IV SOLN: 1000.0000 mg | Freq: Once | INTRAVENOUS | Status: DC | PRN

## 2021-10-21 MED ORDER — AMISULPRIDE (ANTIEMETIC) 5 MG/2ML IV SOLN: 10.0000 mg | Freq: Once | INTRAVENOUS | Status: DC | PRN

## 2021-10-21 MED ORDER — LIDOCAINE HCL (CARDIAC) PF 100 MG/5ML IV SOSY
PREFILLED_SYRINGE | INTRAVENOUS | Status: DC | PRN
Start: 2021-10-21 — End: 2021-10-21
  Administered 2021-10-21: 50 mg via INTRAVENOUS

## 2021-10-21 MED ORDER — CHLORHEXIDINE GLUCONATE CLOTH 2 % EX PADS: 6.0000 | MEDICATED_PAD | Freq: Once | CUTANEOUS | Status: DC

## 2021-10-21 MED ORDER — CEFAZOLIN SODIUM-DEXTROSE 2-4 GM/100ML-% IV SOLN
2.0000 g | INTRAVENOUS | Status: AC
  Administered 2021-10-21: 2 g via INTRAVENOUS

## 2021-10-21 MED ORDER — ONDANSETRON HCL 4 MG/2ML IJ SOLN
INTRAMUSCULAR | Status: AC
  Filled 2021-10-21: qty 2

## 2021-10-21 MED ORDER — ACETAMINOPHEN 325 MG PO TABS: 325.0000 mg | ORAL_TABLET | ORAL | Status: DC | PRN

## 2021-10-21 MED ORDER — KETOROLAC TROMETHAMINE 15 MG/ML IJ SOLN: 15.0000 mg | Freq: Four times a day (QID) | INTRAMUSCULAR | Status: DC | PRN

## 2021-10-21 MED ORDER — ACETAMINOPHEN 500 MG PO TABS
1000.0000 mg | ORAL_TABLET | Freq: Four times a day (QID) | ORAL | Status: DC
  Administered 2021-10-21 – 2021-10-22 (×3): 1000 mg via ORAL
  Filled 2021-10-21 (×3): qty 2

## 2021-10-21 MED ORDER — ONDANSETRON HCL 4 MG/2ML IJ SOLN: 4.0000 mg | Freq: Four times a day (QID) | INTRAMUSCULAR | Status: DC | PRN

## 2021-10-21 MED ORDER — PROMETHAZINE HCL 25 MG/ML IJ SOLN
6.2500 mg | INTRAMUSCULAR | Status: DC | PRN
Start: 2021-10-21 — End: 2021-10-22

## 2021-10-21 MED ORDER — METHOCARBAMOL 500 MG PO TABS: 500.0000 mg | ORAL_TABLET | Freq: Four times a day (QID) | ORAL | 1 refills | Status: DC

## 2021-10-21 MED ORDER — ACETAMINOPHEN 500 MG PO TABS
1000.0000 mg | ORAL_TABLET | ORAL | Status: AC
  Administered 2021-10-21: 1000 mg via ORAL

## 2021-10-21 MED ORDER — ROCURONIUM BROMIDE 100 MG/10ML IV SOLN
INTRAVENOUS | Status: DC | PRN
  Administered 2021-10-21: 20 mg via INTRAVENOUS
  Administered 2021-10-21: 50 mg via INTRAVENOUS

## 2021-10-21 MED ORDER — METHOCARBAMOL 500 MG PO TABS
500.0000 mg | ORAL_TABLET | Freq: Four times a day (QID) | ORAL | Status: DC | PRN
  Administered 2021-10-21: 500 mg via ORAL
  Filled 2021-10-21: qty 1

## 2021-10-21 MED ORDER — SUGAMMADEX SODIUM 200 MG/2ML IV SOLN
INTRAVENOUS | Status: DC | PRN
  Administered 2021-10-21: 200 mg via INTRAVENOUS

## 2021-10-21 MED ORDER — SUGAMMADEX SODIUM 200 MG/2ML IV SOLN: INTRAVENOUS | Status: DC | PRN

## 2021-10-21 MED ORDER — ONDANSETRON 4 MG PO TBDP: 4.0000 mg | ORAL_TABLET | Freq: Four times a day (QID) | ORAL | Status: DC | PRN

## 2021-10-21 MED ORDER — PROPOFOL 10 MG/ML IV BOLUS
INTRAVENOUS | Status: DC | PRN
  Administered 2021-10-21: 170 mg via INTRAVENOUS

## 2021-10-21 MED ORDER — ONDANSETRON HCL 4 MG/2ML IJ SOLN
INTRAMUSCULAR | Status: DC | PRN
  Administered 2021-10-21: 4 mg via INTRAVENOUS

## 2021-10-21 MED ORDER — SIMETHICONE 80 MG PO CHEW: 40.0000 mg | CHEWABLE_TABLET | Freq: Four times a day (QID) | ORAL | Status: DC | PRN

## 2021-10-21 MED ORDER — SCOPOLAMINE 1 MG/3DAYS TD PT72
1.0000 | MEDICATED_PATCH | TRANSDERMAL | Status: DC
  Administered 2021-10-21: 1.5 mg via TRANSDERMAL

## 2021-10-21 MED ORDER — KETOROLAC TROMETHAMINE 15 MG/ML IJ SOLN
INTRAMUSCULAR | Status: AC
  Filled 2021-10-21: qty 1

## 2021-10-21 MED ORDER — OXYCODONE HCL 5 MG PO TABS: 5.0000 mg | ORAL_TABLET | Freq: Four times a day (QID) | ORAL | 0 refills | Status: DC | PRN

## 2021-10-21 MED ORDER — SODIUM CHLORIDE 0.9 % IV SOLN: INTRAVENOUS | Status: DC

## 2021-10-21 MED ORDER — PHENYLEPHRINE HCL (PRESSORS) 10 MG/ML IV SOLN
INTRAVENOUS | Status: DC | PRN
  Administered 2021-10-21: 120 ug via INTRAVENOUS
  Administered 2021-10-21 (×2): 80 ug via INTRAVENOUS
  Administered 2021-10-21: 120 ug via INTRAVENOUS

## 2021-10-21 MED ORDER — OXYCODONE HCL 5 MG/5ML PO SOLN: 5.0000 mg | Freq: Once | ORAL | Status: DC | PRN

## 2021-10-21 MED ORDER — FENTANYL CITRATE (PF) 100 MCG/2ML IJ SOLN
50.0000 ug | Freq: Once | INTRAMUSCULAR | Status: AC
  Administered 2021-10-21: 50 ug via INTRAVENOUS

## 2021-10-21 MED ORDER — DEXAMETHASONE SODIUM PHOSPHATE 4 MG/ML IJ SOLN
INTRAMUSCULAR | Status: DC | PRN
  Administered 2021-10-21: 5 mg via INTRAVENOUS

## 2021-10-21 SURGICAL SUPPLY — 64 items
ADH SKN CLS APL DERMABOND .7 (GAUZE/BANDAGES/DRESSINGS) ×6
APL PRP STRL LF DISP 70% ISPRP (MISCELLANEOUS) ×6
APPLIER CLIP 9.375 MED OPEN (MISCELLANEOUS) ×4
APR CLP MED 9.3 20 MLT OPN (MISCELLANEOUS) ×3
BINDER BREAST LRG (GAUZE/BANDAGES/DRESSINGS) IMPLANT
BINDER BREAST MEDIUM (GAUZE/BANDAGES/DRESSINGS) ×4 IMPLANT
BIOPATCH RED 1 DISK 7.0 (GAUZE/BANDAGES/DRESSINGS) ×8 IMPLANT
BLADE SURG 10 STRL SS (BLADE) ×4 IMPLANT
BLADE SURG 15 STRL LF DISP TIS (BLADE) ×3 IMPLANT
BLADE SURG 15 STRL SS (BLADE) ×4
CANISTER SUCT 1200ML W/VALVE (MISCELLANEOUS) ×4 IMPLANT
CHLORAPREP W/TINT 26 (MISCELLANEOUS) ×8 IMPLANT
CLIP APPLIE 9.375 MED OPEN (MISCELLANEOUS) ×3 IMPLANT
COVER BACK TABLE 60X90IN (DRAPES) ×4 IMPLANT
COVER MAYO STAND STRL (DRAPES) ×4 IMPLANT
COVER PROBE W GEL 5X96 (DRAPES) ×4 IMPLANT
DECANTER SPIKE VIAL GLASS SM (MISCELLANEOUS) ×4 IMPLANT
DERMABOND ADVANCED (GAUZE/BANDAGES/DRESSINGS) ×2
DERMABOND ADVANCED .7 DNX12 (GAUZE/BANDAGES/DRESSINGS) ×6 IMPLANT
DRAIN CHANNEL 19F RND (DRAIN) ×8 IMPLANT
DRAPE TOP ARMCOVERS (MISCELLANEOUS) ×4 IMPLANT
DRAPE U-SHAPE 76X120 STRL (DRAPES) ×4 IMPLANT
DRAPE UTILITY XL STRL (DRAPES) ×4 IMPLANT
DRSG PAD ABDOMINAL 8X10 ST (GAUZE/BANDAGES/DRESSINGS) ×12 IMPLANT
DRSG TEGADERM 4X4.75 (GAUZE/BANDAGES/DRESSINGS) ×8 IMPLANT
ELECT BLADE 4.0 EZ CLEAN MEGAD (MISCELLANEOUS)
ELECT COATED BLADE 2.86 ST (ELECTRODE) ×4 IMPLANT
ELECT REM PT RETURN 9FT ADLT (ELECTROSURGICAL) ×4
ELECTRODE BLDE 4.0 EZ CLN MEGD (MISCELLANEOUS) IMPLANT
ELECTRODE REM PT RTRN 9FT ADLT (ELECTROSURGICAL) ×3 IMPLANT
EVACUATOR SILICONE 100CC (DRAIN) ×8 IMPLANT
GAUZE SPONGE 4X4 12PLY STRL LF (GAUZE/BANDAGES/DRESSINGS) IMPLANT
GLOVE SURG ENC MOIS LTX SZ7 (GLOVE) ×4 IMPLANT
GLOVE SURG UNDER POLY LF SZ6.5 (GLOVE) ×4 IMPLANT
GLOVE SURG UNDER POLY LF SZ7 (GLOVE) ×4 IMPLANT
GLOVE SURG UNDER POLY LF SZ7.5 (GLOVE) ×4 IMPLANT
GOWN STRL REUS W/ TWL LRG LVL3 (GOWN DISPOSABLE) ×6 IMPLANT
GOWN STRL REUS W/TWL LRG LVL3 (GOWN DISPOSABLE) ×8
HEMOSTAT ARISTA ABSORB 3G PWDR (HEMOSTASIS) ×8 IMPLANT
LIGHT WAVEGUIDE WIDE FLAT (MISCELLANEOUS) ×4 IMPLANT
NDL SAFETY ECLIPSE 18X1.5 (NEEDLE) ×3 IMPLANT
NEEDLE HYPO 18GX1.5 SHARP (NEEDLE) ×4
NEEDLE HYPO 25X1 1.5 SAFETY (NEEDLE) ×4 IMPLANT
NS IRRIG 1000ML POUR BTL (IV SOLUTION) ×4 IMPLANT
PACK BASIN DAY SURGERY FS (CUSTOM PROCEDURE TRAY) ×4 IMPLANT
PENCIL SMOKE EVACUATOR (MISCELLANEOUS) ×4 IMPLANT
PIN SAFETY STERILE (MISCELLANEOUS) ×4 IMPLANT
SLEEVE SCD COMPRESS KNEE MED (STOCKING) ×4 IMPLANT
SPONGE T-LAP 18X18 ~~LOC~~+RFID (SPONGE) ×8 IMPLANT
STAPLER VISISTAT 35W (STAPLE) ×4 IMPLANT
STRIP CLOSURE SKIN 1/2X4 (GAUZE/BANDAGES/DRESSINGS) ×8 IMPLANT
SUT ETHILON 2 0 FS 18 (SUTURE) ×8 IMPLANT
SUT MNCRL AB 4-0 PS2 18 (SUTURE) ×4 IMPLANT
SUT SILK 2 0 SH (SUTURE) ×4 IMPLANT
SUT VIC AB 2-0 SH 27 (SUTURE) ×8
SUT VIC AB 2-0 SH 27XBRD (SUTURE) ×6 IMPLANT
SUT VIC AB 3-0 SH 27 (SUTURE) ×4
SUT VIC AB 3-0 SH 27X BRD (SUTURE) ×3 IMPLANT
SUT VICRYL 3-0 CR8 SH (SUTURE) ×12 IMPLANT
SYR CONTROL 10ML LL (SYRINGE) ×4 IMPLANT
TOWEL GREEN STERILE FF (TOWEL DISPOSABLE) ×8 IMPLANT
TRACER MAGTRACE VIAL (MISCELLANEOUS) ×4 IMPLANT
TUBE CONNECTING 20X1/4 (TUBING) ×4 IMPLANT
YANKAUER SUCT BULB TIP NO VENT (SUCTIONS) ×4 IMPLANT

## 2021-10-21 NOTE — Interval H&P Note (Signed)
History and Physical Interval Note:  10/21/2021 12:39 PM  Chelsea Wise  has presented today for surgery, with the diagnosis of BREAST CANCER.  The various methods of treatment have been discussed with the patient and family. After consideration of risks, benefits and other options for treatment, the patient has consented to  Procedure(s): RIGHT MASTECTOMY WITH RIGHT AXILLARY SENTINEL LYMPH NODE BIOPSY (Right) RADIOACTIVE SEED GUIDED RIGHT AXILLARY LYMPH NODE EXCISION (Right) LEFT TOTAL MASTECTOMY (Left) REMOVAL PORT-A-CATH (N/A) as a surgical intervention.  The patient's history has been reviewed, patient examined, no change in status, stable for surgery.  I have reviewed the patient's chart and labs.  Questions were answered to the patient's satisfaction.     Rolm Bookbinder

## 2021-10-21 NOTE — Anesthesia Procedure Notes (Signed)
Anesthesia Regional Block: Pectoralis block   Pre-Anesthetic Checklist: , timeout performed,  Correct Patient, Correct Site, Correct Laterality,  Correct Procedure, Correct Position, site marked,  Risks and benefits discussed,  Surgical consent,  Pre-op evaluation,  At surgeon's request and post-op pain management  Laterality: Left  Prep: chloraprep       Needles:  Injection technique: Single-shot  Needle Type: Echogenic Stimulator Needle     Needle Length: 9cm  Needle Gauge: 21     Additional Needles:   Procedures:,,,, ultrasound used (permanent image in chart),,    Narrative:  Start time: 10/21/2021 12:40 PM End time: 10/21/2021 12:45 PM Injection made incrementally with aspirations every 5 mL.  Performed by: Personally  Anesthesiologist: Effie Berkshire, MD  Additional Notes: Patient tolerated the procedure well. Local anesthetic introduced in an incremental fashion under minimal resistance after negative aspirations. No paresthesias were elicited. After completion of the procedure, no acute issues were identified and patient continued to be monitored by RN.

## 2021-10-21 NOTE — Op Note (Signed)
Preoperative diagnosis: Right breast cancer status post primary chemotherapy Postoperative diagnosis: Same as above Procedure: 1.  Left risk reducing mastectomy 2.  Port removal 3.  Right mastectomy 4.  Right targeted axillary lymph node dissection*(right axillary sentinel lymph node biopsy plus a right axillary node radioactive seed guided excision) 5 injection of mag trace for sentinel lymph node identification Surgeon: Dr. Serita Grammes Anesthesia: General with bilateral pectoral block Estimated blood loss: 50 cc Complications: None Drains: 19 French Blake drain on each side Specimens: 1.  Left breast tissue marked short stitch superior, long stitch lateral 2.  Right breast tissue marked short stitch superior, long stitch lateral 3.  Right targeted lymph node dissection containing radioactive seed as well as the clip Sponge and count was correct at completion Disposition to recovery stable condition  Indications: This is a 40 year old female who presented in May with breast cancer.  She was noted on mammogram and ultrasound to have an area in the upper pole of the right breast with multiple irregular masses measured about 7 x 5 cm.  On ultrasound there was at least a 4.5 cm mass and 2 abnormal nodes.  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  She had negative genetic testing.  Her MammaPrint was high risk.  She underwent primary chemotherapy.  Her follow-up MRI shows decrease in the size to the largest dimension of the cancer being 2 cm and her nodes are normal now.  We discussed all of her options and she wanted to undergo bilateral mastectomies without reconstruction and I discussed a targeted lymph node dissection.  Oncology is cleared her for port removal.  Procedure: After informed consent was obtained the patient was taken to the operating room she was given antibiotics.  SCDs were placed.  She was placed  under general anesthesia without complication.  Surgical timeout was then performed.  I then prepped the area around the right areola.  I injected 2 cc of mag trace in the subareolar position.  She was then prepped and draped in the standard sterile surgical fashion.  Another surgical timeout was then performed.  I did the left mastectomy first.  I made every attempt to make this a flap closure.  I made a large elliptical incision encompassing nipple areola.  I created flaps to the sternum, clavicle, lateral border of the breast, and inframammary fold.  I obliterated the inframammary fold for closure later.  I then removed the breast and the fascia from the muscle.  This was rolled laterally.  This was then removed.  I was able to identify the port from this incision.  I incised the pocket where the port was located and remove the port, line, the suture material.  This was hemostatic.  I then obtained hemostasis.  I placed a 17 [place] Blake drain and secured this with a 2-0 nylon suture.  I then closed the lateral portion down to the chest wall with 3-0 Vicryl.  I then closed the dermis with 3-0 Vicryl.  The skin was closed with 4-0 Monocryl.  Glue and Steri-Strips were eventually applied.  I then did the right mastectomy.  I made a similar elliptical incision on the side and created flaps in the same way.  I then remove the breast and the fascia from the pectoralis muscle and passed this off the table.  I then entered into the axilla.  I identified the radioactive seed containing node.  I remove this.  It was attached several other smaller nodes as well.  These were all removed in a bundle.  These were also sentinel nodes and contained mag trace.  Mammogram confirmed removal of the clip and the seed.  There were no other palpable adenopathy.  I then obtained hemostasis.  I did place some Arista in the axilla.  I placed a 96 [place] Blake drain and secured this with a 2-0 nylon suture.  I then closed the  lateral portion down to the chest wall with 3-0 Vicryl.  I then closed the dermis with 3-0 Vicryl.  The skin was closed with 4-0 Monocryl.  Glue and Steri-Strips were eventually applied.  She was then awakened in the operating room extubated and transferred to recovery stable.

## 2021-10-21 NOTE — Progress Notes (Signed)
Assisted Dr. Smith Robert with right, left, ultrasound guided, pectoralis block. Side rails up, monitors on throughout procedure. See vital signs in flow sheet. Tolerated Procedure well.

## 2021-10-21 NOTE — Transfer of Care (Signed)
Immediate Anesthesia Transfer of Care Note  Patient: Chelsea Wise On  Procedure(s) Performed: RIGHT MASTECTOMY WITH RIGHT AXILLARY SENTINEL LYMPH NODE BIOPSY (Right: Breast) RADIOACTIVE SEED GUIDED RIGHT AXILLARY LYMPH NODE EXCISION (Right: Breast) LEFT TOTAL MASTECTOMY (Left: Breast) REMOVAL PORT-A-CATH (Left: Chest)  Patient Location: PACU  Anesthesia Type:General and regional  Level of Consciousness: awake, alert  and oriented  Airway & Oxygen Therapy: Patient Spontanous Breathing and Patient connected to face mask oxygen  Post-op Assessment: Report given to RN and Post -op Vital signs reviewed and stable  Post vital signs: Reviewed and stable  Last Vitals:  Vitals Value Taken Time  BP    Temp    Pulse    Resp 26 10/21/21 1442  SpO2    Vitals shown include unvalidated device data.  Last Pain:  Vitals:   10/21/21 1059  TempSrc: Oral  PainSc: 0-No pain         Complications: No notable events documented.

## 2021-10-21 NOTE — Discharge Instructions (Addendum)
[facility], [place] [phone]  MASTECTOMY: POST OP INSTRUCTIONS Take 400 mg of ibuprofen every 8 hours or 650 mg tylenol every 6 hours for next 72 hours then as needed. Use ice several times daily also. Always review your discharge instruction sheet given to you by the facility where your surgery was performed. IF YOU HAVE DISABILITY OR FAMILY LEAVE FORMS, YOU MUST BRING THEM TO THE OFFICE FOR PROCESSING.   DO NOT GIVE THEM TO YOUR DOCTOR. A prescription for pain medication may be given to you upon discharge.  Take your pain medication as prescribed, if needed.  If narcotic pain medicine is not needed, then you may take acetaminophen (Tylenol), naprosyn (Alleve) or ibuprofen (Advil) as needed. Take your usually prescribed medications unless otherwise directed. If you need a refill on your pain medication, please contact your pharmacy.  They will contact our office to request authorization.  Prescriptions will not be filled after 5pm or on week-ends. You should follow a light diet the first few days after arrival home, such as soup and crackers, etc.  Resume your normal diet the day after surgery. Most patients will experience some swelling and bruising on the chest and underarm.  Ice packs will help.  Swelling and bruising can take several days to resolve. Wear the binder day and night until you return to the office.  It is common to experience some constipation if taking pain medication after surgery.  Increasing fluid intake and taking a stool softener (such as Colace) will usually help or prevent this problem from occurring.  A mild laxative (Milk of Magnesia or Miralax) should be taken according to package instructions if there are no bowel movements after 48 hours. Unless discharge instructions indicate otherwise, leave your bandage dry and in place until your next appointment in 3-5 days.  You may take a limited sponge bath.  No tube baths or showers until the drains are removed.   You may have steri-strips (small skin tapes) in place directly over the incision.  These strips should be left on the skin for 7-10 days. If you have glue it will come off in next couple week.  Any sutures will be removed at an office visit DRAINS:  If you have drains in place, it is important to keep a list of the amount of drainage produced each day in your drains.  Before leaving the hospital, you should be instructed on drain care.  Call our office if you have any questions about your drains. I will remove your drains when they put out less than 30 cc or ml for 2 consecutive days. ACTIVITIES:  You may resume regular (light) daily activities beginning the next day--such as daily self-care, walking, climbing stairs--gradually increasing activities as tolerated.  You may have sexual intercourse when it is comfortable.  Refrain from any heavy lifting or straining until approved by your doctor. You may drive when you are no longer taking prescription pain medication, you can comfortably wear a seatbelt, and you can safely maneuver your car and apply brakes. RETURN TO WORK:  __________________________________________________________ Dennis Bast should see your doctor in the office for a follow-up appointment approximately 3-5 days after your surgery.  Your doctor's nurse will typically make your follow-up appointment when she calls you with your pathology report.  Expect your pathology report 3-4business days after surgery. OTHER INSTRUCTIONS: ______________________________________________________________________________________________ ____________________________________________________________________________________________ WHEN TO CALL YOUR DR [place]FIELD: Fever over 101.0 Nausea and/or vomiting Extreme swelling or bruising Continued bleeding from incision. Increased pain, redness,  or drainage from the incision. The clinic staff is available to answer your questions during regular business hours.  Please don't  hesitate to call and ask to speak to one of the nurses for clinical concerns.  If you have a medical emergency, go to the nearest emergency room or call 911.  A surgeon from [facility] is always on call at the hospital. [address] [place], [place],  [place]  [address] ? [address], [place], [place]   [address] [phone] ? [phone] ? FAX [phone] Web site: [email or web address]  About my Jackson-Pratt Bulb Drain  What is a Jackson-Pratt bulb? A Jackson-Pratt is a soft, round device used to collect drainage. It is connected to a long, thin drainage catheter, which is held in place by one or two small stiches near your surgical incision site. When the bulb is squeezed, it forms a vacuum, forcing the drainage to empty into the bulb.  Emptying the Jackson-Pratt bulb- To empty the bulb: 1. Release the plug on the top of the bulb. 2. Pour the bulb's contents into a measuring container which your nurse will provide. 3. Record the time emptied and amount of drainage. Empty the drain(s) as often as your     doctor or nurse recommends.  Date                  Time                    Amount (Drain 1)                 Amount (Drain 2)  _____________________________________________________________________  _____________________________________________________________________  _____________________________________________________________________  _____________________________________________________________________  _____________________________________________________________________  _____________________________________________________________________  _____________________________________________________________________  _____________________________________________________________________  Squeezing the Jackson-Pratt Bulb- To squeeze the bulb: 1. Make sure the plug at the top of the bulb is open. 2. Squeeze the bulb tightly in your fist. You  will hear air squeezing from the bulb. 3. Replace the plug while the bulb is squeezed. 4. Use a safety pin to attach the bulb to your clothing. This will keep the catheter from     pulling at the bulb insertion site.  When to call your doctor- Call your doctor if: Drain site becomes red, swollen or hot. You have a fever greater than 101 degrees F. There is oozing at the drain site. Drain falls out (apply a guaze bandage over the drain hole and secure it with tape). Drainage increases daily not related to activity patterns. (You will usually have more drainage when you are active than when you are resting.) Drainage has a bad odor.

## 2021-10-21 NOTE — H&P (Signed)
40 y.o. female who is seen today for follow-up for breast cancer after chemotherapy. She presented in May with breast cancer. She has a significant family history. She is on ultrasound at a 2.5 cm upper pole right breast mass with multiple other irregular masses measuring 7 x 5 cm. There was also a 1.4 cm subareolar area. On ultrasound there was at least a 4.5 cm mass and 2 abnormal lymph nodes were present. Biopsy of the breast showed a grade 2 invasive ductal carcinoma with DCIS that was 80% ER positive, 1% PR positive, HER2/neu is negative, and Ki-67 is 25%. She had negative genetic testing. We elected to proceed with primary chemotherapy after a MammaPrint was high risk.. She has completed this as of October 18 and is done fairly well with this. Her follow-up MRI after completion shows C density breast. There is decrease in size of the original 5.7 cm mass to the largest dimension being 2 cm. The nodes are normal bilaterally at this point. She is here with her husband to discuss options.  Review of Systems: A complete review of systems was obtained from the patient. I have reviewed this information and discussed as appropriate with the patient. See HPI as well for other ROS.  Review of Systems  Respiratory: Positive for cough.  Skin: Positive for itching.  All other systems reviewed and are negative.   Medical History: Past Medical History:  Diagnosis Date   History of cancer   Patient Active Problem List  Diagnosis   Malignant neoplasm of upper-inner quadrant of right breast in female, estrogen receptor positive (CMS-HCC)   Past Surgical History:  Procedure Laterality Date   port placment  05/2021   No Known Allergies  Current Outpatient Medications on File Prior to Visit  Medication Sig Dispense Refill   cetirizine (ZYRTEC) 10 MG tablet Take by mouth   No current facility-administered medications on file prior to visit.   Family History  Problem Relation Age of Onset   High  blood pressure (Hypertension) Father    Social History   Tobacco Use  Smoking Status Current Some Day Smoker  Smokeless Tobacco Never Used    Social History   Socioeconomic History   Marital status: Unknown  Tobacco Use   Smoking status: Current Some Day Smoker   Smokeless tobacco: Never Used  Substance and Sexual Activity   Alcohol use: Yes   Drug use: Not Currently   Objective:    Physical Exam Constitutional:  Appearance: Normal appearance.  Chest:  Breasts:  Right: Mass present. No inverted nipple or nipple discharge.  Left: No inverted nipple, mass or nipple discharge.  Comments: Vague 2 cm area Neurological:  Mental Status: She is alert.    Assessment and Plan:   Malignant neoplasm of upper-inner quadrant of right breast in female, estrogen receptor positive (CMS-HCC)  Right mastectomy, right axillary targeted lymph node dissection, port removal, left risk reducing mastectomy  I do think with her initial presentation and breast size it a mastectomy is still the right answer for the right side. She is not desirous of any reconstruction. We discussed a mastectomy like her with an anchor type of incision. She is also very interested in a risk reducing mastectomy on the other side. Her genetics are negative and that she did not have any abnormality on that side. We discussed that this would not help her survival and would not absolutely prevent her from getting cancer on that side either. She is interested in this due  to her young age.  I will also remove her port as this is okay with oncology. We discussed her nodes and eventually we elected to proceed with a targeted node dissection. I will have a radioactive seed placed in the prior positive node as well as doing a sentinel lymph node biopsy with mag trace. We discussed surgery, risks, and recovery and we will proceed about a month after she completed chemotherapy.

## 2021-10-21 NOTE — Anesthesia Procedure Notes (Signed)
Anesthesia Regional Block: Pectoralis block   Pre-Anesthetic Checklist: , timeout performed,  Correct Patient, Correct Site, Correct Laterality,  Correct Procedure, Correct Position, site marked,  Risks and benefits discussed,  Surgical consent,  Pre-op evaluation,  At surgeon's request and post-op pain management  Laterality: Right  Prep: chloraprep       Needles:  Injection technique: Single-shot  Needle Type: Echogenic Stimulator Needle     Needle Length: 9cm  Needle Gauge: 21     Additional Needles:   Procedures:,,,, ultrasound used (permanent image in chart),,    Narrative:  Start time: 10/21/2021 12:45 PM End time: 10/21/2021 12:50 PM Injection made incrementally with aspirations every 5 mL.  Performed by: Personally  Anesthesiologist: Effie Berkshire, MD  Additional Notes: Patient tolerated the procedure well. Local anesthetic introduced in an incremental fashion under minimal resistance after negative aspirations. No paresthesias were elicited. After completion of the procedure, no acute issues were identified and patient continued to be monitored by RN.

## 2021-10-21 NOTE — Anesthesia Preprocedure Evaluation (Addendum)
Anesthesia Evaluation  Patient identified by MRN, date of birth, ID band Patient awake    Reviewed: Allergy & Precautions, NPO status , Patient's Chart, lab work & pertinent test results  Airway Mallampati: II  TM Distance: >3 FB Neck ROM: Full    Dental  (+) Partial Lower, Partial Upper   Pulmonary    Pulmonary exam normal        Cardiovascular  Rhythm:Regular Rate:Normal  Echo:  1. Left ventricular ejection fraction by 3D volume is 63 %. The left  ventricle has normal function. The left ventricle has no regional wall  motion abnormalities. Left ventricular diastolic parameters were normal.  The average left ventricular global  longitudinal strain is -22.5 %. The global longitudinal strain is normal.  2. Right ventricular systolic function is normal. The right ventricular  size is normal. There is normal pulmonary artery systolic pressure. The  estimated right ventricular systolic pressure is 71.6 mmHg.  3. The mitral valve is grossly normal. Trivial mitral valve  regurgitation. No evidence of mitral stenosis.  4. The aortic valve is tricuspid. Aortic valve regurgitation is not  visualized. No aortic stenosis is present.  5. The inferior vena cava is normal in size with greater than 50%  respiratory variability, suggesting right atrial pressure of 3 mmHg.   Neuro/Psych    GI/Hepatic negative GI ROS, Neg liver ROS,   Endo/Other    Renal/GU      Musculoskeletal   Abdominal Normal abdominal exam  (+)   Peds  Hematology   Anesthesia Other Findings   Reproductive/Obstetrics                            Anesthesia Physical Anesthesia Plan  ASA: 2  Anesthesia Plan: General   Post-op Pain Management:    Induction:   PONV Risk Score and Plan: 4 or greater and Ondansetron, Dexamethasone, Midazolam and Scopolamine patch - Pre-op  Airway Management Planned: Oral ETT  Additional  Equipment: None  Intra-op Plan:   Post-operative Plan: Extubation in OR  Informed Consent: I have reviewed the patients History and Physical, chart, labs and discussed the procedure including the risks, benefits and alternatives for the proposed anesthesia with the patient or authorized representative who has indicated his/her understanding and acceptance.     Dental advisory given  Plan Discussed with: CRNA  Anesthesia Plan Comments:        Anesthesia Quick Evaluation

## 2021-10-21 NOTE — Anesthesia Procedure Notes (Signed)
Procedure Name: Intubation Date/Time: 10/21/2021 12:46 PM Performed by: Verita Lamb, CRNA Pre-anesthesia Checklist: Patient identified, Emergency Drugs available, Suction available and Patient being monitored Patient Re-evaluated:Patient Re-evaluated prior to induction Oxygen Delivery Method: Circle system utilized Preoxygenation: Pre-oxygenation with 100% oxygen Induction Type: IV induction Ventilation: Mask ventilation without difficulty Laryngoscope Size: Mac and 4 Grade View: Grade I Tube type: Oral Tube size: 7.0 mm Number of attempts: 1 Airway Equipment and Method: Stylet and Oral airway Placement Confirmation: ETT inserted through vocal cords under direct vision, positive ETCO2, breath sounds checked- equal and bilateral and CO2 detector Secured at: 23 cm Tube secured with: Tape Dental Injury: Teeth and Oropharynx as per pre-operative assessment

## 2021-10-21 NOTE — Anesthesia Postprocedure Evaluation (Signed)
Anesthesia Post Note  Patient: Chelsea Wise  Procedure(s) Performed: RIGHT MASTECTOMY WITH RIGHT AXILLARY SENTINEL LYMPH NODE BIOPSY (Right: Breast) RADIOACTIVE SEED GUIDED RIGHT AXILLARY LYMPH NODE EXCISION (Right: Breast) LEFT TOTAL MASTECTOMY (Left: Breast) REMOVAL PORT-A-CATH (Left: Chest)     Patient location during evaluation: PACU Anesthesia Type: General Level of consciousness: awake and alert Pain management: pain level controlled Vital Signs Assessment: post-procedure vital signs reviewed and stable Respiratory status: spontaneous breathing, nonlabored ventilation, respiratory function stable and patient connected to nasal cannula oxygen Cardiovascular status: blood pressure returned to baseline and stable Postop Assessment: no apparent nausea or vomiting Anesthetic complications: no   No notable events documented.  Last Vitals:  Vitals:   10/21/21 1530 10/21/21 1600  BP: 116/75 (!) 142/88  Pulse: 69 (!) 57  Resp: 13 16  Temp:  36.8 C  SpO2: 95% 99%    Last Pain:  Vitals:   10/21/21 1600  TempSrc:   PainSc: 3                  Effie Berkshire

## 2021-10-22 NOTE — Discharge Summary (Signed)
Physician Discharge Summary  Patient ID: Chelsea Wise MRN: [id number] DOB/AGE: [birth year] 40 y.o.  Admit date: 10/21/2021 Discharge date: 10/22/2021  Admission Diagnoses: Breast cancer, right s/p primary chemotherapy  Discharge Diagnoses:  Principal Problem:   S/P bilateral mastectomy   Discharged Condition: good  Hospital Course: 18 yof s/p bilateral mastectomies, right TAD, port removal, ready for dc this am  Consults: None  Significant Diagnostic Studies: none  Treatments: surgery: bilateral mastectomy  Discharge Exam: Blood pressure 112/71, pulse 85, temperature 98.8 F (37.1 C), resp. rate 16, height 5\' 3"  (1.6 m), weight 60.4 kg, SpO2 100 %, unknown if currently breastfeeding. No hematoma, flaps viable, drains serous  Disposition: Discharge disposition: 01-Home or Self Care        Allergies as of 10/22/2021   No Known Allergies      Medication List     TAKE these medications    acetaminophen 500 MG tablet Commonly known as: TYLENOL Take 500-1,000 mg by mouth every 6 (six) hours as needed for moderate pain.   cetirizine 10 MG tablet Commonly known as: ZYRTEC Take 10 mg by mouth daily as needed for allergies.   ELDERBERRY PO Take 1 capsule by mouth daily.   hydrocortisone cream 1 % Apply 1 application topically 2 (two) times daily as needed for itching.   lidocaine-prilocaine cream Commonly known as: EMLA Apply to affected area once   methocarbamol 500 MG tablet Commonly known as: Robaxin Take 1 tablet (500 mg total) by mouth 4 (four) times daily.   multivitamin with minerals Tabs tablet Take 2 tablets by mouth daily.   nystatin 100000 UNIT/ML suspension Commonly known as: MYCOSTATIN TAKE 5 MLS (500,000 UNITS TOTAL) BY MOUTH IN THE MORNING AND AT BEDTIME.   ondansetron 8 MG tablet Commonly known as: Zofran Take 1 tablet (8 mg total) by mouth 2 (two) times daily as needed. Start on the third day after chemotherapy.    oxyCODONE 5 MG immediate release tablet Commonly known as: Oxy IR/ROXICODONE Take 1 tablet (5 mg total) by mouth every 6 (six) hours as needed.   prochlorperazine 10 MG tablet Commonly known as: COMPAZINE Take 1 tablet (10 mg total) by mouth every 6 (six) hours as needed (Nausea or vomiting).   sodium chloride 0.65 % Soln nasal spray Commonly known as: OCEAN Place 1 spray into both nostrils as needed for congestion.   VITAMIN C PO Take 1 tablet by mouth daily.   ZINC PO Take 1 tablet by mouth once a week.        Follow-up Information     Rolm Bookbinder, MD Follow up in 3 week(s).   Specialty: General Surgery Contact information: [address] [place] [address] [phone]                 Signed: Rolm Bookbinder 10/22/2021, 6:46 AM

## 2021-10-24 ENCOUNTER — Encounter (HOSPITAL_BASED_OUTPATIENT_CLINIC_OR_DEPARTMENT_OTHER): Payer: Self-pay | Admitting: General Surgery

## 2021-10-25 NOTE — Addendum Note (Signed)
Addendum  created 10/25/21 0754 by Glory Buff, CRNA   Charge Capture section accepted

## 2021-10-31 NOTE — Progress Notes (Signed)
Patient Care Team: Patient, [name] (Inactive) as PCP - General (General Practice) [name], RN as Oncology Nurse Navigator [name], RN as Oncology Nurse Navigator  DIAGNOSIS:    ICD-10-CM   1. Malignant neoplasm of upper-inner quadrant of right breast in female, estrogen receptor positive ([place])  C50.211    Z17.0       SUMMARY OF ONCOLOGIC HISTORY: Oncology History  Malignant neoplasm of upper-inner quadrant of right breast in female, estrogen receptor positive ([place])  04/08/2021 Initial Diagnosis   Palpable right breast mass: Mammogram and US showed a 4.5cm right breast mass at the 12-1 o'clock position with associated microcalcifications, multiple additional masses from the 9-11 o'clock position in the right breast, calcifications in the subareolar right breast, 1.4cm, and two enlarged right axillary lymph nodes. Biopsy showed invasive and in situ ductal carcinoma in the breast and axilla, grade 2 ER 80%, PR 1%, Ki-67 25%, HER2 equivalent by [facility]   04/08/2021 Cancer Staging   Staging form: Breast, AJCC 8th Edition - Clinical stage from 04/08/2021: Stage IIA (cT2, cN1, cM0, G2, ER+, PR+, HER2-) - Signed by Nicholas Lose, MD on 04/13/2021 Stage prefix: Initial diagnosis Histologic grading system: 3 grade system    04/19/2021 Genetic Testing   Negative genetic testing:  No pathogenic variants detected on the [employer] BRCAplus or CancerNext-Expanded + RNAinsight panels. Two variants of uncertain significance (VUS) were detected - one in the APC gene called p.T2422I (c.7265C>T) and a second in the BAP1 gene called p.P302L (c.905C>T). The report dates are 04/19/2021 and 04/25/2021, respectively.  The BRCAplus panel offered by [employer] and includes sequencing and deletion/duplication analysis for the following 8 genes: ATM, BRCA1, BRCA2, CDH1, CHEK2, PALB2, PTEN, and TP53. The CancerNext-Expanded + RNAinsight gene panel offered by [employer] and includes sequencing and  rearrangement analysis for the following 77 genes: AIP, ALK, APC, ATM, AXIN2, BAP1, BARD1, BLM, BMPR1A, BRCA1, BRCA2, BRIP1, CDC73, CDH1, CDK4, CDKN1B, CDKN2A, CHEK2, CTNNA1, DICER1, FANCC, FH, FLCN, GALNT12, KIF1B, LZTR1, MAX, MEN1, MET, MLH1, MSH2, MSH3, MSH6, MUTYH, NBN, NF1, NF2, NTHL1, PALB2, PHOX2B, PMS2, POT1, PRKAR1A, PTCH1, PTEN, RAD51C, RAD51D, RB1, RECQL, RET, SDHA, SDHAF2, SDHB, SDHC, SDHD, SMAD4, SMARCA4, SMARCB1, SMARCE1, STK11, SUFU, TMEM127, TP53, TSC1, TSC2, VHL and XRCC2 (sequencing and deletion/duplication); EGFR, EGLN1, HOXB13, KIT, MITF, PDGFRA, POLD1 and POLE (sequencing only); EPCAM and GREM1 (deletion/duplication only). RNA data is routinely analyzed for use in variant interpretation for all genes.    05/10/2021 - 09/20/2021 Chemotherapy   Patient is on Treatment Plan : BREAST ADJUVANT DOSE DENSE AC q14d / PACLitaxel q7d       CHIEF COMPLIANT: Follow-up of right breast cancer  INTERVAL HISTORY: Chelsea Wise is a 40 y.o. with above-mentioned history of  right breast cancer having completed neoadjuvant chemotherapy with dose dense Adriamycin and Cytoxan, and chemotherapy with Taxol. She reports to the clinic today for follow-up after undergoing bilateral mastectomies.  She is healing and recovering very well from recent surgery.  She is still using muscle relaxants for relief.  She was constipated but it had improved since she stopped taking opioids.  ALLERGIES:  has No Known Allergies.  MEDICATIONS:  Current Outpatient Medications  Medication Sig Dispense Refill   acetaminophen (TYLENOL) 500 MG tablet Take 500-1,000 mg by mouth every 6 (six) hours as needed for moderate pain.     Ascorbic Acid (VITAMIN C PO) Take 1 tablet by mouth daily.     cetirizine (ZYRTEC) 10 MG tablet Take 10 mg  by mouth daily as needed for allergies.     ELDERBERRY PO Take 1 capsule by mouth daily.     hydrocortisone cream 1 % Apply 1 application topically 2 (two) times daily as needed for  itching.     methocarbamol (ROBAXIN) 500 MG tablet Take 1 tablet (500 mg total) by mouth 4 (four) times daily. 30 tablet 1   Multiple Vitamin (MULTIVITAMIN WITH MINERALS) TABS tablet Take 2 tablets by mouth daily.     Multiple Vitamins-Minerals (ZINC PO) Take 1 tablet by mouth once a week.     nystatin (MYCOSTATIN) 100000 UNIT/ML suspension TAKE 5 MLS (500,000 UNITS TOTAL) BY MOUTH IN THE MORNING AND AT BEDTIME. 60 mL 0   oxyCODONE (OXY IR/ROXICODONE) 5 MG immediate release tablet Take 1 tablet (5 mg total) by mouth every 6 (six) hours as needed. 10 tablet 0   sodium chloride (OCEAN) 0.65 % SOLN nasal spray Place 1 spray into both nostrils as needed for congestion.     No current facility-administered medications for this visit.    PHYSICAL EXAMINATION: ECOG PERFORMANCE STATUS: 1 - Symptomatic but completely ambulatory  Vitals:   11/01/21 0902  BP: 131/71  Pulse: 79  Resp: 18  Temp: (!) 97.5 F (36.4 C)  SpO2: 100%   Filed Weights   11/01/21 0902  Weight: 133 lb 11.2 oz (60.6 kg)      LABORATORY DATA:  I have reviewed the data as listed CMP Latest Ref Rng & Units 09/20/2021 09/13/2021 09/06/2021  Glucose 70 - 99 mg/dL 93 83 85  BUN 6 - 20 mg/dL _0 Creatinine 0.44 - 1.00 mg/dL 0.70 0.70 0.67  Sodium 135 - 145 mmol/L 141 138 140  Potassium 3.5 - 5.1 mmol/L 3.8 3.7 3.8  Chloride 98 - 111 mmol/L 107 104 106  CO2 22 - 32 mmol/L _1 Calcium 8.9 - 10.3 mg/dL 9.5 9.3 9.5  Total Protein 6.5 - 8.1 g/dL 7.2 7.1 7.1  Total Bilirubin 0.3 - 1.2 mg/dL 0.3 0.3 0.3  Alkaline Phos 38 - 126 U/L 48 50 50  AST 15 - 41 U/L _2 ALT 0 - 44 U/L _3 Lab Results  Component Value Date   WBC 5.2 09/20/2021   HGB 11.2 (L) 09/20/2021   HCT 33.3 (L) 09/20/2021   MCV 95.1 09/20/2021   PLT 301 09/20/2021   NEUTROABS 3.5 09/20/2021    ASSESSMENT & PLAN:  Malignant neoplasm of upper-inner quadrant of right breast in female, estrogen receptor positive  ([place]) 04/08/2021:Palpable right breast mass: Mammogram and US showed a 4.5cm right breast mass at the 12-1 o'clock position with associated microcalcifications, multiple additional masses from the 9-11 o'clock position in the right breast, calcifications in the subareolar right breast, 1.4cm, and two enlarged right axillary lymph nodes. 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  Neoadjuvant chemotherapy with dose dense Adriamycin and Cytoxan x4 followed by Taxol weekly x12 completed 09/14/21 2. Bilateral mastectomies with targeted node dissection 10/21/21 Left: Benign Right: 6 cm residual IDC 2/7 LN Positive ER: 80%, PR 1%, Her 2 Neg 3.  Adjuvant radiation 4.  Followed by adjuvant antiestrogen therapy -------------------------------------------------------------------------------------------------------------------- Pathology counseling: I  discussed the final pathology report of the patient provided  a copy of this report. I discussed the margins as well as lymph node surgeries. We also discussed the final staging along with previously performed ER/PR and HER-2/neu testing.     Monitoring closely for neuropathy.  Mild sensation of the fingers but it is not bothering her. Breast MRI has been scheduled for 09/22/2021.  We discussed different options for antiestrogen therapy including Zoladex with letrozole and Verzenio versus tamoxifen plus Verzenio We will make a final decision after she finishes radiation.   Return to clinic after radiation    No orders of the defined types were placed in this encounter.  The patient has a good understanding of the overall plan. she agrees with it. she will call with any problems that may  develop before the next visit here.  Total time spent: 30 mins including face to face time and time spent for planning, charting and coordination of care  Rulon Eisenmenger, MD, MPH 11/01/2021  I, Thana Ates, am acting as scribe for Dr. Nicholas Lose.  I have reviewed the above documentation for accuracy and completeness, and I agree with the above.

## 2021-10-31 NOTE — Assessment & Plan Note (Signed)
04/08/2021:Palpable right breast mass: Mammogram and US showed a 4.5cm right breast mass at the 12-1 o'clock position with associated microcalcifications, multiple additional masses from the 9-11 o'clock position in the right breast, calcifications in the subareolar right breast, 1.4cm, and two enlarged right axillary lymph nodes. 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 Neoadjuvant chemotherapy with dose dense Adriamycin and Cytoxan x4 followed by Taxol weekly x12 completed 09/14/21 2. Bilateral mastectomies with targeted node dissection 10/21/21 Left: Benign Right: 6 cm residual IDC 2/7 LN Positive ER: 80%, PR 1%, Her 2 Neg 3. Adjuvant radiation 4. Followed by adjuvant antiestrogen therapy -------------------------------------------------------------------------------------------------------------------- Pathology counseling: I discussed the final pathology report of the patient provided  a copy of this report. I discussed the margins as well as lymph node surgeries. We also discussed the final staging along with previously performed ER/PR and HER-2/neu testing.   Monitoring closely for neuropathy.Mild sensation of the fingers but it is not bothering her. Breast MRI has been scheduled for 09/22/2021.   Return to clinic after radiation

## 2021-11-01 ENCOUNTER — Encounter: Payer: Self-pay | Admitting: *Deleted

## 2021-11-01 ENCOUNTER — Other Ambulatory Visit: Payer: Self-pay

## 2021-11-01 ENCOUNTER — Inpatient Hospital Stay
Payer: Federal, State, Local not specified - PPO | Attending: Hematology and Oncology | Admitting: Hematology and Oncology

## 2021-11-01 DIAGNOSIS — C50211 Malignant neoplasm of upper-inner quadrant of right female breast: Secondary | ICD-10-CM | POA: Insufficient documentation

## 2021-11-01 DIAGNOSIS — Z170 Estrogen receptor positive status [ER+]: Secondary | ICD-10-CM | POA: Diagnosis not present

## 2021-11-01 DIAGNOSIS — Z9013 Acquired absence of bilateral breasts and nipples: Secondary | ICD-10-CM | POA: Insufficient documentation

## 2021-11-01 DIAGNOSIS — C773 Secondary and unspecified malignant neoplasm of axilla and upper limb lymph nodes: Secondary | ICD-10-CM | POA: Diagnosis present

## 2021-11-01 DIAGNOSIS — Z9221 Personal history of antineoplastic chemotherapy: Secondary | ICD-10-CM | POA: Insufficient documentation

## 2021-11-10 ENCOUNTER — Encounter: Payer: Self-pay | Admitting: Physical Therapy

## 2021-11-10 ENCOUNTER — Ambulatory Visit: Payer: Federal, State, Local not specified - PPO | Admitting: Physical Therapy

## 2021-11-10 ENCOUNTER — Other Ambulatory Visit: Payer: Self-pay

## 2021-11-10 DIAGNOSIS — C50211 Malignant neoplasm of upper-inner quadrant of right female breast: Secondary | ICD-10-CM | POA: Insufficient documentation

## 2021-11-10 DIAGNOSIS — M25612 Stiffness of left shoulder, not elsewhere classified: Secondary | ICD-10-CM

## 2021-11-10 DIAGNOSIS — Z170 Estrogen receptor positive status [ER+]: Secondary | ICD-10-CM

## 2021-11-10 DIAGNOSIS — M25611 Stiffness of right shoulder, not elsewhere classified: Secondary | ICD-10-CM | POA: Insufficient documentation

## 2021-11-10 DIAGNOSIS — Z483 Aftercare following surgery for neoplasm: Secondary | ICD-10-CM | POA: Insufficient documentation

## 2021-11-10 DIAGNOSIS — Z510 Encounter for antineoplastic radiation therapy: Secondary | ICD-10-CM | POA: Diagnosis present

## 2021-11-10 DIAGNOSIS — R293 Abnormal posture: Secondary | ICD-10-CM

## 2021-11-10 IMAGING — RF DG FLUORO GUIDE CV LINE
1 series · 1 of 1 positions shown · non-contrast
Comparison: None.

CLINICAL DATA: 39-year-old female status post port catheter

EXAM:
CHEST FLUOROSCOPY
TECHNIQUE: Real-time fluoroscopic evaluation of the chest was performed.
FLUOROSCOPY TIME:  Fluoroscopy Time:  21 seconds

[Series 1: unknown protocol · 0.20mm/px · 1 of 1 slices shown]
[im 1/1]
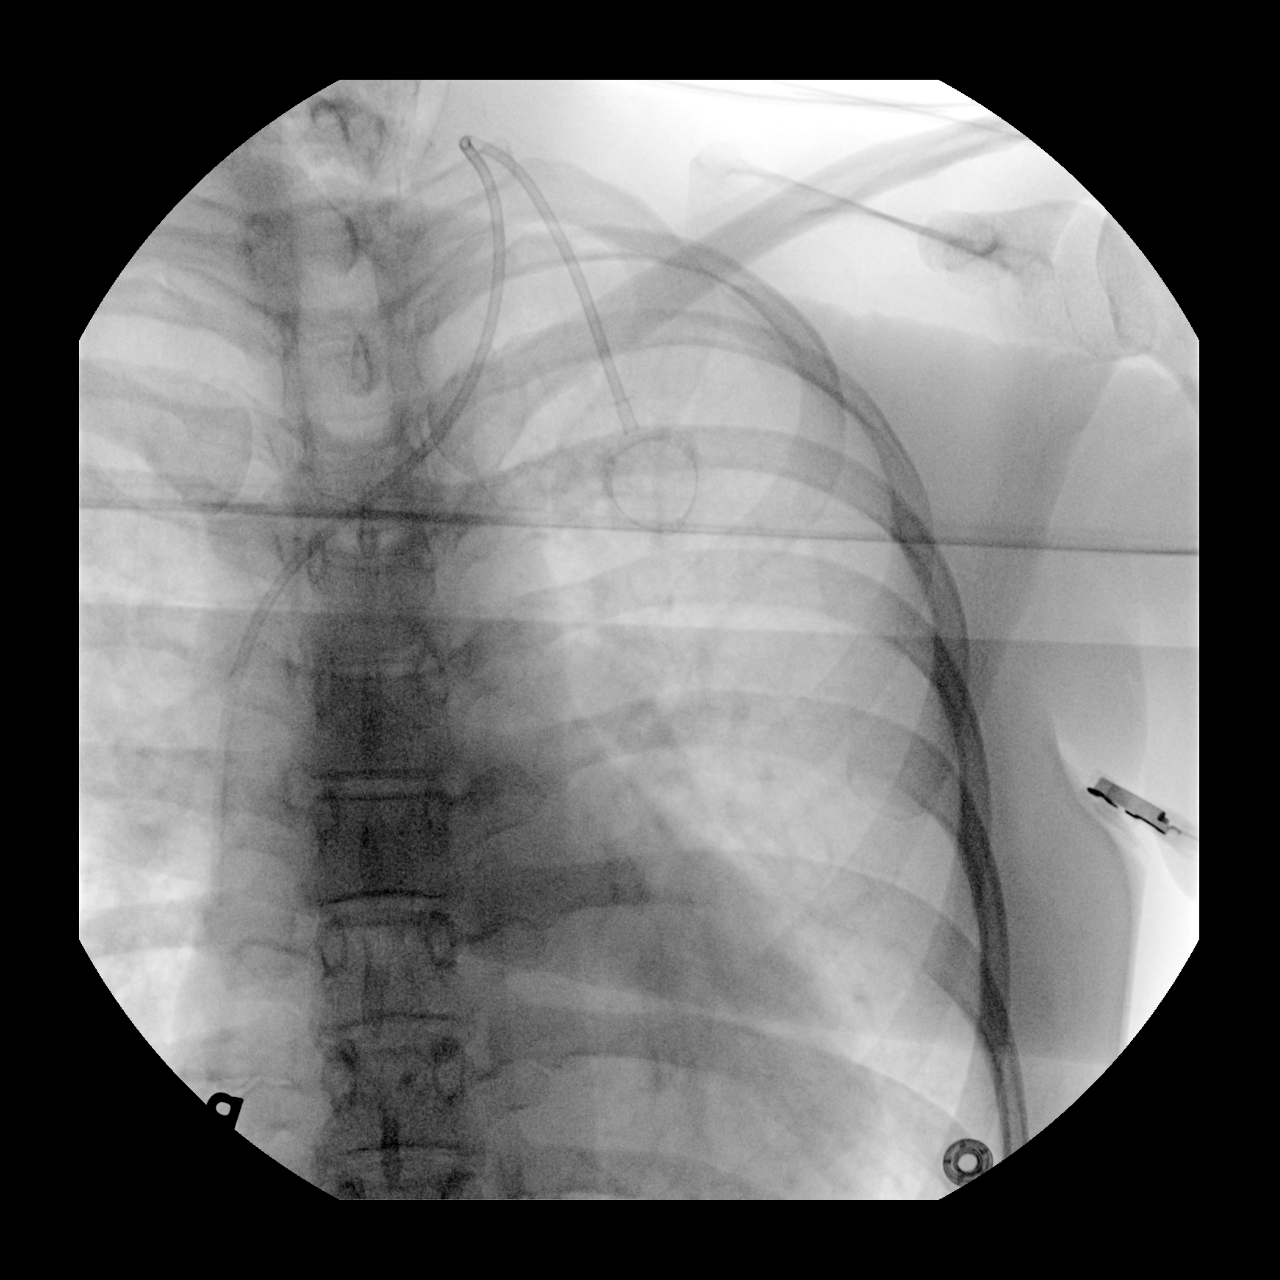

[1 of 1 positions shown; findings below may reference images not displayed]

FINDINGS: Limited intraoperative fluoroscopic spot image of the left chest.

Left IJ port catheter with the tip terminating at the superior
cavoatrial junction.
IMPRESSION: Limited intraoperative fluoroscopic spot image of a left IJ port
catheter.

Please refer to the dictated operative report for full details of
intraoperative findings and procedure.

## 2021-11-10 NOTE — Patient Instructions (Signed)
     [facility]  [address], [address]  [place] [address]  [phone]  After Breast Cancer Class It is recommended you attend the ABC class to be educated on lymphedema risk reduction. This class is free of charge and lasts for 1 hour. It is a 1-time class. You will need to download the Webex app either on your phone or computer. We will send you a link the night before or the morning of the class. You should be able to click on that link to join the class. This is not a confidential class. You don't have to turn your camera on, but other participants may be able to see your email address. You are scheduled for December 19th 2022 at 11:00.  Scar massage You can begin gentle scar massage to you incision sites. Gently place one hand on the incision and move the skin (without sliding on the skin) in various directions. Do this for a few minutes each day and then you can progress to gently massage either coconut oil or vitamin E cream into the scars ONCE YOUR STERI-STRIPS COME OFF.  Compression garment You should continue wearing your compression bra until you feel like you no longer have swelling.  Home exercise Program Continue doing the exercises you were given until you feel like you can do them without feeling any tightness at the end. Begin the 2nd and 4th ones laying flat on your back.  Walking Program Studies show that 30 minutes of walking per day (fast enough to elevate your heart rate) can significantly reduce the risk of a cancer recurrence. If you can't walk due to other medical reasons, we encourage you to find another activity you could do (like a stationary bike or water exercise).  Posture After breast cancer surgery, people frequently sit with rounded shoulders posture because it puts their incisions on slack and feels better. If you sit like this and scar tissue forms in that position, you can become very tight and have pain sitting or standing with  good posture. Try to be aware of your posture and sit and stand up tall to heal properly. Practice standing with your back against the wall trying to have your buttocks, shoulder and head against the wall.  Follow up PT: It is recommended you return every 3 months for the first 3 years following surgery to be assessed on the SOZO machine for an L-Dex score. This helps prevent clinically significant lymphedema in 95% of patients. These follow up screens are 10 minute appointments that you are not billed for.

## 2021-11-10 NOTE — Therapy (Signed)
[facility] @ [place], [place], [address] Phone: [phone]   Fax:  [phone]  Physical Therapy Treatment  Patient Details  Name: Chelsea Wise MRN: [id number] Date of Birth: [birth year] Referring Provider (PT): Dr. Rolm Bookbinder   Encounter Date: 11/10/2021   PT End of Session - 11/10/21 1054     Visit Number 2    Number of Visits 10    Date for PT Re-Evaluation 12/08/21    PT Start Time 0910    PT Stop Time 1008    PT Time Calculation (min) 58 min    Activity Tolerance Patient tolerated treatment well    Behavior During Therapy [employer] for tasks assessed/performed             Past Medical History:  Diagnosis Date   Cancer ([place])    Breast   Family history of breast cancer    Family history of lung cancer    Family history of pancreatic cancer    Family history of prostate cancer    Medical history non-contributory    Placenta previa    SVD (spontaneous vaginal delivery) 01/18/2014    Past Surgical History:  Procedure Laterality Date   MASTECTOMY W/ SENTINEL NODE BIOPSY Right 10/21/2021   Procedure: RIGHT MASTECTOMY WITH RIGHT AXILLARY SENTINEL LYMPH NODE BIOPSY;  Surgeon: Rolm Bookbinder, MD;  Location: [facility];  Service: General;  Laterality: Right;   NO PAST SURGERIES     PORT-A-CATH REMOVAL Left 10/21/2021   Procedure: REMOVAL PORT-A-CATH;  Surgeon: Rolm Bookbinder, MD;  Location: [facility];  Service: General;  Laterality: Left;   PORTACATH PLACEMENT Left 05/09/2021   Procedure: INSERTION PORT-A-CATH;  Surgeon: Rolm Bookbinder, MD;  Location: [facility];  Service: General;  Laterality: Left;   RADIOACTIVE SEED GUIDED AXILLARY SENTINEL LYMPH NODE Right 10/21/2021   Procedure: RADIOACTIVE SEED GUIDED RIGHT AXILLARY LYMPH NODE EXCISION;  Surgeon: Rolm Bookbinder, MD;  Location: [facility];  Service: General;  Laterality: Right;    TOTAL MASTECTOMY Left 10/21/2021   Procedure: LEFT TOTAL MASTECTOMY;  Surgeon: Rolm Bookbinder, MD;  Location: [facility];  Service: General;  Laterality: Left;   WISDOM TOOTH EXTRACTION      There were no vitals filed for this visit.   Subjective Assessment - 11/10/21 0911     Subjective Patient reports she underwent neoadjuvant chemotherapy from 05/10/2021 - 09/20/2021 followed by a bilateral mastectomy and right targeted axillary dissection with 2/7 positive nodes on 10/21/2021. Left breast negative for carcinoma. She sees her radiation oncologist 11/15/2021.    Patient Stated Goals Get right arm back to normal    Currently in Pain? Yes    Pain Score 5     Pain Location Arm    Pain Orientation Right    Pain Descriptors / Indicators Numbness;Tightness    Pain Type Surgical pain    Pain Onset 1 to 4 weeks ago    Pain Frequency Intermittent    Aggravating Factors  Sleeping and reaching    Pain Relieving Factors Nothing    Multiple Pain Sites No                [facility] PT Assessment - 11/10/21 0001       Assessment   Medical Diagnosis Right breast cancer; s/p bil mastectomy with right targeted axillary dissection    Referring Provider (PT) Dr. Rolm Bookbinder    Onset Date/Surgical Date 10/04/21  Hand Dominance Right    Prior Therapy Baselines      Precautions   Precautions Other (comment)    Precaution Comments right arm lymphedema risk      Restrictions   Weight Bearing Restrictions No      Balance Screen   Has the patient fallen in the past 6 months No    Has the patient had a decrease in activity level because of a fear of falling?  No    Is the patient reluctant to leave their home because of a fear of falling?  No      Home Social worker Private residence    Living Arrangements Spouse/significant other;Children   Has 5 and 7 y.o. kids   Available Help at Discharge Family      Prior Function   Level of Independence  Independent    Vocation Full time employment    Vocation Requirements HR    Leisure She has not returned to exercise yet      Cognition   Overall Cognitive Status Within Functional Limits for tasks assessed      Observation/Other Assessments   Observations Bil chest incisions are healing well with steri-strips still in place. Mild edema present right pec insertion area. Issued knitted knockers and compression foam pad applied to right upper chest inside bra.      Posture/Postural Control   Posture/Postural Control Postural limitations    Postural Limitations Rounded Shoulders;Forward head      ROM / Strength   AROM / PROM / Strength AROM      AROM   AROM Assessment Site Shoulder    Right/Left Shoulder Right;Left    Right Shoulder Extension 54 Degrees    Right Shoulder Flexion 111 Degrees    Right Shoulder ABduction 116 Degrees    Right Shoulder Internal Rotation 53 Degrees    Right Shoulder External Rotation 80 Degrees    Left Shoulder Extension 55 Degrees    Left Shoulder Flexion 138 Degrees    Left Shoulder ABduction 132 Degrees    Left Shoulder Internal Rotation 83 Degrees    Left Shoulder External Rotation 86 Degrees               LYMPHEDEMA/ONCOLOGY QUESTIONNAIRE - 11/10/21 0001       Type   Cancer Type Right breast cancer      Surgeries   Mastectomy Date 10/21/21    Axillary Lymph Node Dissection Date 10/21/21    Number Lymph Nodes Removed 7      Treatment   Active Chemotherapy Treatment No    Past Chemotherapy Treatment Yes    Date 09/20/21    Active Radiation Treatment No    Past Radiation Treatment No    Current Hormone Treatment No    Past Hormone Therapy No      What other symptoms do you have   Are you Having Heaviness or Tightness Yes    Are you having Pain Yes    Are you having pitting edema No    Is it Hard or Difficult finding clothes that fit No    Do you have infections No    Is there Decreased scar mobility Yes    Stemmer Sign No       Lymphedema Assessments   Lymphedema Assessments Upper extremities      Right Upper Extremity Lymphedema   10 cm Proximal to Olecranon Process 25.7 cm    Olecranon Process 22.6 cm    10  cm Proximal to Ulnar Styloid Process 21.5 cm    Just Proximal to Ulnar Styloid Process 15.1 cm    Across Hand at [employer] 20.3 cm    At [place] of 2nd Digit 6 cm      Left Upper Extremity Lymphedema   10 cm Proximal to Olecranon Process 25.1 cm    Olecranon Process 21.9 cm    10 cm Proximal to Ulnar Styloid Process 20.1 cm    Just Proximal to Ulnar Styloid Process 14.5 cm    Across Hand at [employer] 19.2 cm    At [place] of 2nd Digit 5.9 cm                Quick Dash - 11/10/21 0001     Open a tight or new jar Unable    Do heavy household chores (wash walls, wash floors) Unable    Carry a shopping bag or briefcase Moderate difficulty    Wash your back Moderate difficulty    Use a knife to cut food No difficulty    Recreational activities in which you take some force or impact through your arm, shoulder, or hand (golf, hammering, tennis) Unable    During the past week, to what extent has your arm, shoulder or hand problem interfered with your normal social activities with family, friends, neighbors, or groups? Quite a bit    During the past week, to what extent has your arm, shoulder or hand problem limited your work or other regular daily activities Quite a bit    Arm, shoulder, or hand pain. Moderate    Tingling (pins and needles) in your arm, shoulder, or hand Moderate    Difficulty Sleeping Mild difficulty    DASH Score 61.36 %                             PT Education - 11/10/21 1054     Education Details Scar massage; HEP; aftercare    Person(s) Educated Patient;Spouse    Methods Explanation;Demonstration;Handout    Comprehension Returned demonstration;Verbalized understanding                 PT Long Term Goals - 11/10/21 1100       PT  LONG TERM GOAL #1   Title Patient will demonstrate she has regained full shoulder ROM and function post op compared to baselines.    Time 4    Period Weeks    Status On-going    Target Date 12/08/21      PT LONG TERM GOAL #2   Title Patient will improve her DASH score to be back to zero for improved overall upper extremity function.    Baseline zero pre-op and 61.36 post op    Time 4    Period Weeks    Status New    Target Date 12/08/21      PT LONG TERM GOAL #3   Title Patient will increase bilateral shoulder flexion to >/= 150 degrees for increased ease reaching overhead.    Baseline Right 111 post op    Time 4    Period Weeks    Status New    Target Date 12/08/21      PT LONG TERM GOAL #4   Title Patient will increase bilateral shoulder abduction to >/= 160 degrees for improved ability to obtain radiation positioning.    Baseline 116 degrees right side post op  Time 4    Period Weeks    Status New    Target Date 12/08/21      PT LONG TERM GOAL #5   Title Patient will be independent in her HEP for impvoed shoulder function.    Time 4    Period Weeks    Status New    Target Date 12/08/21                   Plan - 11/10/21 1055     Clinical Impression Statement Patient is healing well s/p bil mastectomy and right node dissection on 10/21/2021. She had 2 of 7 lymph nodes positive. She underwent neoadjuvant chemotherapy and will begin radiation next week. She has significantly limited shoulder ROM on the right side and somewhat limited ROM on the left. Her scar tissue tightness is present and she will benefit from PT to regain shoulder function and ROM and address scar tissue. She does not plan to undergo reconstruction.    PT Frequency 2x / week    PT Duration 4 weeks    PT Treatment/Interventions ADLs/Self Care Home Management;Therapeutic exercise;Patient/family education    PT Next Visit Plan Begin ROM exerecises and PROM    PT Home Exercise Plan Post op HEP     Consulted and Agree with Plan of Care Patient;Family member/caregiver    Family Member Consulted Husband             Patient will benefit from skilled therapeutic intervention in order to improve the following deficits and impairments:  Postural dysfunction, Decreased range of motion, Decreased knowledge of precautions, Impaired UE functional use, Pain, Increased edema, Decreased scar mobility  Visit Diagnosis: Malignant neoplasm of upper-inner quadrant of right breast in female, estrogen receptor positive ([place]) - Plan: PT plan of care cert/re-cert  Abnormal posture - Plan: PT plan of care cert/re-cert  Aftercare following surgery for neoplasm - Plan: PT plan of care cert/re-cert  Stiffness of left shoulder, not elsewhere classified - Plan: PT plan of care cert/re-cert  Stiffness of right shoulder, not elsewhere classified - Plan: PT plan of care cert/re-cert     Problem List Patient Active Problem List   Diagnosis Date Noted   S/P bilateral mastectomy 10/21/2021   Port-A-Cath in place 06/07/2021   Genetic testing 04/19/2021   Malignant neoplasm of upper-inner quadrant of right breast in female, estrogen receptor positive ([place]) 04/13/2021   Family history of prostate cancer    Family history of pancreatic cancer    Family history of breast cancer    Family history of lung cancer    Active labor 03/15/2016   Postpartum care following vaginal delivery 03/15/2016   SVD (spontaneous vaginal delivery) 01/18/2014   Labor and delivery, indication for care 01/17/2014   Antepartum bleeding, third trimester 01/01/2014   Placenta previa in second trimester 11/28/2013   Annia Friendly, PT 11/10/21 11:10 AM   [facility] @ [place], [place], [address] Phone: [phone]   Fax:  [phone]  Name: Chelsea Wise MRN: [id number] Date of Birth: [birth year]

## 2021-11-11 LAB — SURGICAL PATHOLOGY

## 2021-11-14 NOTE — Progress Notes (Signed)
Location of Breast Cancer:  Malignant neoplasm of upper-inner quadrant of right breast in female, estrogen receptor positive  Histology per Pathology Report:  10/21/2021 FINAL MICROSCOPIC DIAGNOSIS:  A. BREAST, LEFT, MASTECTOMY:  - Benign breast tissue.  - No evidence of malignancy.  B. BREAST, RIGHT, MASTECTOMY:  - Invasive and in situ ductal carcinoma, 6 cm (post neoadjuvant  therapy).  - Margins negative for carcinoma.  - Focal atypical lobular hyperplasia.  - Biopsy sites and biopsy clips.  - See oncology table.  C. LYMPH NODES, RIGHT AXILLARY DISSECTION WITH TARGETED NODE:  - Metastatic carcinoma in two of seven lymph nodes (2/7).  - Largest metastasis is 1.1 cm.   Receptor Status:  04/08/2021 (before pre-adjuvant chemotherapy): ER(95%), PR (Negative), Her2-neu (Negative via [place]), Ki-67(1%) 10/21/2021 (after pre-adjuvant chemotherapy):  ER(95%), PR (Negative), Her2-neu (Negative via [place]), Ki-67(1%)  Did patient present with symptoms (if so, please note symptoms) or was this found on screening mammography?: Patient felt a palpable right breast mass. 04/08/2021 Mammogram and US showed a 4.5cm right breast mass at the 12-1 o'clock position with associated microcalcifications, multiple additional masses from the 9-11 o'clock position in the right breast, calcifications in the subareolar right breast, 1.4cm, and two enlarged right axillary lymph nodes.  Past/Anticipated interventions by surgeon, if any:  10/21/2021 --Dr. Rolm Bookbinder Left risk reducing mastectomy Port removal Right mastectomy Right targeted axillary lymph node dissection*(right axillary sentinel lymph node biopsy plus a right axillary node radioactive seed guided excision) Injection of mag trace for sentinel lymph node identification  Past/Anticipated interventions by medical oncology, if any:  Under care of Dr. Nicholas Lose 11/01/2021 --Treatment plan: Neoadjuvant chemotherapy with dose dense  Adriamycin and Cytoxan x4 followed by Taxol weekly x12 completed 09/14/21 Bilateral mastectomies with targeted node dissection 10/21/21 Left: Benign Right: 6 cm residual IDC 2/7 LN Positive ER: 80%, PR 1%, Her 2 Neg Adjuvant radiation 4.  Followed by adjuvant antiestrogen therapy --Monitoring closely for neuropathy.  Mild sensation of the fingers but it is not bothering her. --Breast MRI has been scheduled for 09/22/2021. --We discussed different options for antiestrogen therapy including Zoladex with letrozole and Verzenio versus tamoxifen plus Verzenio We will make a final decision after she finishes radiation. --Return to clinic after radiation  Lymphedema issues, if any:  Patient denies; Does report slight decrease in range of motion to the right arm/shoulder; Working with PT 2x week    Pain issues, if any:  Denies--does report a heaviness to the right side of her chest when she's sleeping on her back   SAFETY ISSUES: Prior radiation? No Pacemaker/ICD? No Possible current pregnancy? LMP: 07/2021 (hasn't had since received Taxol infusions) Is the patient on methotrexate? No  Current Complaints / other details:  Nothing else of note

## 2021-11-14 NOTE — Progress Notes (Signed)
Radiation Oncology         ([phone]) [phone] ________________________________  Initial Outpatient Consultation  Name: Chelsea Wise MRN: [id number]  Date: 11/15/2021  DOB: 06-Aug-1981  YY:[id number], [name] (Inactive)  Chelsea Lose, MD   REFERRING PHYSICIAN: Nicholas Lose, MD  DIAGNOSIS:    ICD-10-CM   1. 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 female who presented with a palpable right breast lump. Subsequently, the patient underwent diagnostic right breast mammogram on the date of 04/06/21 which showed a highly suspicious mass within the right breast at the 12-1 o'clock axis, 6 cm from the nipple measuring 4.5 cm, with associated pleomorphic microcalcifications, corresponding to the palpable area of concern. Multiple additional highly suspicious masses were appreciated within the outer right breast, extending from the 9-11 o'clock axes, 3-5 cm from the nipple, contiguous and near contiguous throughout and therefore difficult to measure, but corresponding with the greater than 7 cm extent seen mammographically, also with associated pleomorphic calcifications, and highly suspicious for additional multifocal /multicentric disease. Additionally, two enlarged lymph nodes in the right axilla were appreciated, highly suspicious for metastatic lymphadenopathy, as well as additional fine pleomorphic calcifications within the subareolar right breast extending into the nipple, measuring 1.4 cm in extent, and suspicious for additional multicentric disease. No evidence of malignancy was appreciated within the left breast  Biopsies of the right  breast on the date of 04/08/21 at the 1 o'clock and 11 o'clock positions revealed invasive and in-situ ductal carcinoma from both biopsy sites, measuring 12 mm and 14 mm in the greatest linear extent respectively. Biopsy of the right axillary lymph node revealed metastatic carcinoma.  ER status: 80% positive with moderate staining intensity; PR status 1% positive with weak - moderate staining intensity, Her2 status negative; Grade 2.   Genetic testing collected on 04/13/21 revealed no clinically significant variants detected on the BRCAplus panel. 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   Subsequently, the patient was referred to Dr. Lindi Wise on 04/13/21 to discuss treatment options. Following discussion of her condition, Dr. Lindi Wise recommended further workup with MRI to determine metastatic disease extent, followed by Mammaprint testing to determine the need for chemo, mastectomy with nodal dissection, XRT, and antiestrogen therapy.   Mammaprint testing collected on the core biopsy sample revealed the patient as high-risk.   Bilateral breast MRI on 04/15/21 revealed 5.7 cm lobulated mass within the right breast with two enlarged right axillary lymph nodes.  Subsequently, the patient returned to Dr. Lindi Wise on 04/29/21 to discuss the role of chemotherapy. Following discussion of her systemic treatment options, the patient opted to proceed with neoadjuvant chemotherapy consisting of dose dense Adriamycin and Cytoxan x4, followed by Taxol weekly x12. The patient received treatment on 05/10/21 through 09/20/21.   Bilateral breast MRI on 09/22/21 revealed an interval marked decrease in size of the biopsy-proven malignancy in the upper right breast, measuring 2.0 x 0.6 x 1.3 cm, previously measuring 4.9 x 2.6 x 5.7 cm. An interval decrease in size was also seen of several  right axillary lymph nodes. No  abnormal lymph nodes were appreciate bilaterally.   The patient proceeded to undergo bilateral mastectomies and nodal biopsies on 10/21/21 under the care of Dr. Donne Wise. Pathology from the procedure revealed:  --Right mastectomy: invasive and in-situ ductal carcinoma measuring 6 cm, with focal atypical lobular hyperplasia; all margins negative for carcinoma.   ER: 95% positive with strong to moderate staining intensity; PR negative; Her2 negative; proliferation marker Ki67 at 1%; Grade 2 --Left mastectomy: benign breast tissue; no evidence of malignancy --Nodal status: metastatic carcinoma in 2/7 + right axillary lymph nodes, with the largest metastasis measuring 1.1 cm.  + focal ECE  She is with her husband today, healing well from surgery   Lymphedema issues, if any:  Patient denies; Does report slight decrease in range of motion to the right arm/shoulder; Working with PT 2x week    Pain issues, if any:  Denies--does report a heaviness to the right side of her chest when she's sleeping on her back   SAFETY ISSUES: Prior radiation? No Pacemaker/ICD? No Possible current pregnancy? LMP: 07/2021 (hasn't had since received Taxol infusions) Is the patient on methotrexate? No  Current Complaints / other details:  Nothing else of note      PREVIOUS RADIATION THERAPY: No  PAST MEDICAL HISTORY:  has a past medical history of Cancer ([place]), Family history of breast cancer, Family history of lung cancer, Family history of pancreatic cancer, Family history of prostate cancer, Medical history non-contributory, Placenta previa, and SVD (spontaneous vaginal delivery) (01/18/2014).    PAST SURGICAL HISTORY: Past Surgical History:  Procedure Laterality Date   MASTECTOMY W/ SENTINEL NODE BIOPSY Right 10/21/2021   Procedure: RIGHT MASTECTOMY WITH RIGHT AXILLARY SENTINEL LYMPH NODE BIOPSY;  Surgeon: Rolm Bookbinder, MD;  Location: [facility];  Service: General;  Laterality: Right;   NO  PAST SURGERIES     PORT-A-CATH REMOVAL Left 10/21/2021   Procedure: REMOVAL PORT-A-CATH;  Surgeon: Rolm Bookbinder, MD;  Location: [facility];  Service: General;  Laterality: Left;   PORTACATH PLACEMENT Left 05/09/2021   Procedure: INSERTION PORT-A-CATH;  Surgeon: Rolm Bookbinder, MD;  Location: [facility];  Service: General;  Laterality: Left;   RADIOACTIVE SEED GUIDED AXILLARY SENTINEL LYMPH NODE Right 10/21/2021   Procedure: RADIOACTIVE SEED GUIDED RIGHT AXILLARY LYMPH NODE EXCISION;  Surgeon: Rolm Bookbinder, MD;  Location: [facility];  Service: General;  Laterality: Right;   TOTAL MASTECTOMY Left 10/21/2021   Procedure: LEFT TOTAL MASTECTOMY;  Surgeon: Rolm Bookbinder, MD;  Location: [facility];  Service: General;  Laterality: Left;   WISDOM TOOTH EXTRACTION      FAMILY HISTORY: family history includes Breast cancer (age of onset: 44) in her maternal aunt; Diabetes in her maternal aunt and maternal uncle; Hypertension in her father, maternal aunt, and maternal uncle; Lung cancer in her maternal aunt; Lung cancer (age of onset: 53) in her maternal aunt and maternal uncle; Lung cancer (age of onset: 46) in her maternal aunt; Pancreatic cancer in her paternal grandmother; Prostate cancer in her paternal uncle and another family member; Prostate cancer (age of onset: 59) in her maternal grandfather; Prostate cancer (age of onset: 85) in her father; Stroke in her paternal grandfather.  SOCIAL HISTORY:  reports that she has never smoked. She has never used smokeless tobacco. She reports that she does not drink alcohol and does not use drugs.  ALLERGIES: Patient has no known allergies.  MEDICATIONS:  Current Outpatient Medications  Medication Sig  Dispense Refill   acetaminophen (TYLENOL) 500 MG tablet Take 500-1,000 mg by mouth every 6 (six) hours as needed for moderate pain.     Ascorbic Acid (VITAMIN C PO) Take 1 tablet by mouth daily.      cetirizine (ZYRTEC) 10 MG tablet Take 10 mg by mouth daily as needed for allergies.     ELDERBERRY PO Take 1 capsule by mouth daily.     hydrocortisone cream 1 % Apply 1 application topically 2 (two) times daily as needed for itching.     methocarbamol (ROBAXIN) 500 MG tablet Take 1 tablet (500 mg total) by mouth 4 (four) times daily. 30 tablet 1   Multiple Vitamin (MULTIVITAMIN WITH MINERALS) TABS tablet Take 2 tablets by mouth daily.     Multiple Vitamins-Minerals (ZINC PO) Take 1 tablet by mouth once a week.     nystatin (MYCOSTATIN) 100000 UNIT/ML suspension TAKE 5 MLS (500,000 UNITS TOTAL) BY MOUTH IN THE MORNING AND AT BEDTIME. 60 mL 0   oxyCODONE (OXY IR/ROXICODONE) 5 MG immediate release tablet Take 1 tablet (5 mg total) by mouth every 6 (six) hours as needed. 10 tablet 0   sodium chloride (OCEAN) 0.65 % SOLN nasal spray Place 1 spray into both nostrils as needed for congestion.     No current facility-administered medications for this encounter.    REVIEW OF SYSTEMS: As above in HPI.   PHYSICAL EXAM:  weight is 134 lb 6 oz (61 kg). Her blood pressure is 129/85 and her pulse is 66. Her respiration is 17 and oxygen saturation is 100%.   General: Alert and oriented, in no acute distress MSK :adequate ROM in right shoulder for RT planning Skin: no cellulitis or nodularity of skin of right chest wall Psychiatric: Judgment and insight are intact. Affect is appropriate. Breasts: satisfactory healing of right mastectomy scar  - no sign of locoregional recurrence  ECOG = 0  0 - Asymptomatic (Fully active, able to carry on all predisease activities without restriction)  1 - Symptomatic but completely ambulatory (Restricted in physically strenuous activity but ambulatory and able to carry out work of a light or sedentary nature. For example, light housework, office work)  2 - Symptomatic, <50% in bed during the day (Ambulatory and capable of all self care but unable to carry out any work  activities. Up and about more than 50% of waking hours)  3 - Symptomatic, >50% in bed, but not bedbound (Capable of only limited self-care, confined to bed or chair 50% or more of waking hours)  4 - Bedbound (Completely disabled. Cannot carry on any self-care. Totally confined to bed or chair)  5 - Death   Eustace Pen MM, Creech RH, Tormey DC, et al. [phone]). "Toxicity and response criteria of the [facility] [employer]". [place] Oncol. 5 (6): 649-55   LABORATORY DATA:  Lab Results  Component Value Date   WBC 5.2 09/20/2021   HGB 11.2 (L) 09/20/2021   HCT 33.3 (L) 09/20/2021   MCV 95.1 09/20/2021   PLT 301 09/20/2021   CMP     Component Value Date/Time   NA 141 09/20/2021 1123   K 3.8 09/20/2021 1123   CL 107 09/20/2021 1123   CO2 24 09/20/2021 1123   GLUCOSE 93 09/20/2021 1123   BUN 8 09/20/2021 1123   CREATININE 0.70 09/20/2021 1123   CALCIUM 9.5 09/20/2021 1123   PROT 7.2 09/20/2021 1123   ALBUMIN 4.2 09/20/2021 1123   AST 19 09/20/2021 1123  ALT 13 09/20/2021 1123   ALKPHOS 48 09/20/2021 1123   BILITOT 0.3 09/20/2021 1123   GFRNONAA >60 09/20/2021 1123   GFRAA >90 03/20/2013 0700         RADIOGRAPHY: MM Breast Surgical Specimen  Result Date: 10/21/2021 CLINICAL DATA:  Evaluate surgical specimen following excision of RIGHT axillary lymph node with metastatic disease. EXAM: SPECIMEN RADIOGRAPH OF THE RIGHT AXILLA COMPARISON:  Previous exam(s). FINDINGS: Status post excision of the RIGHT axilla. The radioactive seed and TRIBELL biopsy marker clip are present and completely intact. IMPRESSION: Specimen radiograph of the RIGHT axilla. Electronically Signed   By: Margarette Canada M.D.   On: 10/21/2021 14:11  [place] RT RADIOACTIVE SEED LOC  Result Date: 10/18/2021 CLINICAL DATA:  The patient has known right breast cancer. The patient had a lymph node with metastatic disease biopsied in May of 2022. The patient has had chemotherapy. The patient is going to surgery for  a right mastectomy and a right lymph node dissection. The patient is here to localize the previously biopsied right axillary node. EXAM: ULTRASOUND GUIDED RADIOACTIVE SEED LOCALIZATION OF THE RIGHT BREAST COMPARISON:  Previous exam(s). FINDINGS: Patient presents for radioactive seed localization prior to surgery. I met with the patient and we discussed the procedure of seed localization including benefits and alternatives. We discussed the high likelihood of a successful procedure. We discussed the risks of the procedure including infection, bleeding, tissue injury and further surgery. We discussed the low dose of radioactivity involved in the procedure. Informed, written consent was given. The usual time-out protocol was performed immediately prior to the procedure. The sonographer and myself both attempted to find the lymph node containing the tri bell biopsy marker. When we thought we had identified the appropriate node containing the clip, we proceeded to seed placement. Using ultrasound guidance, sterile technique, 1% lidocaine and an I-125 radioactive seed, the targeted lymph node thought to contain the biopsy clip was localized using a lateral approach. Unfortunately, follow-up mammographic images demonstrate the lymph node containing the biopsy clip which does NOT contain the radioactive seed. Despite multiple attempts, we were not able to obtain an axillary image demonstrating the radioactive seed due to its depth. Follow-up survey of the patient confirms presence of the radioactive seed. Order number of I-125 seed:  [id number]. Total activity:  1.324 millicuries reference Date: May 23, 2021 The patient tolerated the procedure well and was released from the [facility]. She was given instructions regarding seed removal. IMPRESSION: Radioactive seed localization right breast. IT IS IMPORTANT TO NOTE THE RADIOACTIVE SEED WAS NOT PLACED INTO THE LYMPH NODE CONTAINING THE BIOPSY CLIP. Electronically Signed    By: Dorise Bullion III M.D.   On: 10/18/2021 13:02   MM CLIP PLACEMENT RIGHT  Result Date: 10/18/2021 CLINICAL DATA:  Evaluate radioactive seed placement. EXAM: 3D DIAGNOSTIC RIGHT MAMMOGRAM POST ULTRASOUND GUIDED RADIOACTIVE SEED PLACEMENT COMPARISON:  Previous exam(s). FINDINGS: 3D Mammographic images were obtained following ultrasound guided radioactive seed placement. The lymph node containing the biopsy clip, noted to be metastatic in May of 2022, is identified. Unfortunately, the radioactive seed is NOT within this lymph node. The radioactive seed could not be pulled into view. Survey after the procedure indicates the radioactive seed was placed IMPRESSION: THE RADIOACTIVE SEED IS NOT WITHIN THE LYMPH NODE CONTAINING THE BIOPSY CLIP. THE RADIOACTIVE SEED IS PRESENT BASED ON THE SURVEY OBTAINED AFTER PLACEMENT. HOWEVER, THE RADIOACTIVE SEED CANNOT BE TO SEEN ON TODAY'S IMAGING. Final Assessment: Post Procedure Mammograms for Marker Placement Electronically  Signed   By: Dorise Bullion III M.D.   On: 10/18/2021 13:07     IMPRESSION/PLAN: This is a delightful 40 yo woman with locally advanced right breast cancer.   It was a pleasure meeting the patient today. We discussed the risks, benefits, and side effects of radiotherapy. I recommend radiotherapy to the right chest wall and regional nodes to reduce her risk of locoregional recurrence by 2/3.  We discussed that radiation would take approximately 6 weeks to complete. Anticipate RT planning in near future. We spoke about acute effects including skin irritation and fatigue as well as much less common late effects including internal organ injury or irritation. We discussed possible injury to lung, ribs, and normal tissues in torso/neck. Consent signed today. We spoke about the latest technology that is used to minimize the risk of late effects for patients undergoing radiotherapy to the breast or chest wall. No guarantees of treatment were given. The  patient is enthusiastic about proceeding with treatment. I look forward to participating in the patient's care.    Urine preg test negative today. Discussed importance of avoiding pregnancy during treatment.  On date of service, in total, I spent 60 minutes on this encounter. Patient was seen in person.   __________________________________________   Eppie Gibson, MD  This document serves as a record of services personally performed by Eppie Gibson, MD. It was created on her behalf by Roney Mans, a trained medical scribe. The creation of this record is based on the scribe's personal observations and the provider's statements to them. This document has been checked and approved by the attending provider.

## 2021-11-15 ENCOUNTER — Ambulatory Visit
Admission: RE | Admit: 2021-11-15 | Discharge: 2021-11-15 | Disposition: A | Discharge status: Home / Self Care | Payer: Federal, State, Local not specified - PPO | Source: Ambulatory Visit | Attending: Radiation Oncology | Admitting: Radiation Oncology

## 2021-11-15 ENCOUNTER — Ambulatory Visit: Payer: Federal, State, Local not specified - PPO

## 2021-11-15 ENCOUNTER — Other Ambulatory Visit: Payer: Self-pay

## 2021-11-15 ENCOUNTER — Encounter: Payer: Self-pay | Admitting: Radiation Oncology

## 2021-11-15 VITALS — BP 129/85 | HR 66 | Resp 17 | Wt 134.4 lb

## 2021-11-15 DIAGNOSIS — Z510 Encounter for antineoplastic radiation therapy: Secondary | ICD-10-CM | POA: Insufficient documentation

## 2021-11-15 DIAGNOSIS — M25612 Stiffness of left shoulder, not elsewhere classified: Secondary | ICD-10-CM

## 2021-11-15 DIAGNOSIS — C773 Secondary and unspecified malignant neoplasm of axilla and upper limb lymph nodes: Secondary | ICD-10-CM | POA: Diagnosis not present

## 2021-11-15 DIAGNOSIS — Z170 Estrogen receptor positive status [ER+]: Secondary | ICD-10-CM

## 2021-11-15 DIAGNOSIS — C50211 Malignant neoplasm of upper-inner quadrant of right female breast: Secondary | ICD-10-CM | POA: Insufficient documentation

## 2021-11-15 DIAGNOSIS — Z803 Family history of malignant neoplasm of breast: Secondary | ICD-10-CM | POA: Diagnosis not present

## 2021-11-15 DIAGNOSIS — Z483 Aftercare following surgery for neoplasm: Secondary | ICD-10-CM

## 2021-11-15 DIAGNOSIS — Z79899 Other long term (current) drug therapy: Secondary | ICD-10-CM | POA: Diagnosis not present

## 2021-11-15 DIAGNOSIS — Z801 Family history of malignant neoplasm of trachea, bronchus and lung: Secondary | ICD-10-CM | POA: Insufficient documentation

## 2021-11-15 DIAGNOSIS — M25611 Stiffness of right shoulder, not elsewhere classified: Secondary | ICD-10-CM

## 2021-11-15 DIAGNOSIS — R293 Abnormal posture: Secondary | ICD-10-CM

## 2021-11-15 LAB — PREGNANCY, URINE: Preg Test, Ur: NEGATIVE

## 2021-11-15 NOTE — Therapy (Signed)
[facility] @ [address] [place], [place], [address] Phone: [phone]   Fax:  [phone]  Physical Therapy Treatment  Patient Details  Name: Chelsea Wise MRN: [id number] Date of Birth: 14-Mar-1981 Referring Provider (PT): Dr. Rolm Bookbinder   Encounter Date: 11/15/2021   PT End of Session - 11/15/21 1306     Visit Number 3    Number of Visits 10    Date for PT Re-Evaluation 12/08/21    PT Start Time 1300    PT Stop Time 3818    PT Time Calculation (min) 56 min    Activity Tolerance Patient tolerated treatment well    Behavior During Therapy [facility] for tasks assessed/performed             Past Medical History:  Diagnosis Date   Cancer ([place])    Breast   Family history of breast cancer    Family history of lung cancer    Family history of pancreatic cancer    Family history of prostate cancer    Medical history non-contributory    Placenta previa    SVD (spontaneous vaginal delivery) 01/18/2014    Past Surgical History:  Procedure Laterality Date   MASTECTOMY W/ SENTINEL NODE BIOPSY Right 10/21/2021   Procedure: RIGHT MASTECTOMY WITH RIGHT AXILLARY SENTINEL LYMPH NODE BIOPSY;  Surgeon: Rolm Bookbinder, MD;  Location: [facility];  Service: General;  Laterality: Right;   NO PAST SURGERIES     PORT-A-CATH REMOVAL Left 10/21/2021   Procedure: REMOVAL PORT-A-CATH;  Surgeon: Rolm Bookbinder, MD;  Location: [facility];  Service: General;  Laterality: Left;   PORTACATH PLACEMENT Left 05/09/2021   Procedure: INSERTION PORT-A-CATH;  Surgeon: Rolm Bookbinder, MD;  Location: [facility];  Service: General;  Laterality: Left;   RADIOACTIVE SEED GUIDED AXILLARY SENTINEL LYMPH NODE Right 10/21/2021   Procedure: RADIOACTIVE SEED GUIDED RIGHT AXILLARY LYMPH NODE EXCISION;  Surgeon: Rolm Bookbinder, MD;  Location: [facility];  Service: General;  Laterality: Right;    TOTAL MASTECTOMY Left 10/21/2021   Procedure: LEFT TOTAL MASTECTOMY;  Surgeon: Rolm Bookbinder, MD;  Location: [facility];  Service: General;  Laterality: Left;   WISDOM TOOTH EXTRACTION      There were no vitals filed for this visit.   Subjective Assessment - 11/15/21 1259     Subjective Patient reports she underwent neoadjuvant chemotherapy from 05/10/2021 - 09/20/2021 followed by a bilateral mastectomy and right targeted axillary dissection with 2/7 positive nodes on 10/21/2021. Left breast negative for carcinoma. She sees her radiation oncologist 11/15/2021. ROM is gettig a little better. Have radiation simulation on Friday.    Patient Stated Goals Get right arm back to normal    Currently in Pain? No/denies    Pain Score 0-No pain                               [facility] Adult PT Treatment/Exercise - 11/15/21 0001       Shoulder Exercises: Supine   Other Supine Exercises wand for flexion, scaption x 5      Shoulder Exercises: Pulleys   Flexion 2 minutes    Scaption 1 minute    ABduction 2 minutes      Manual Therapy   Soft tissue mobilization Soft tissue to bilateral  pectorals, lats and bilateteral UT in supine    Myofascial Release Right axillary/upper arm area of cording  Passive ROM PROM bilateral shouder flexion, scaption, D2 flexion, abd                     PT Education - 11/15/21 1349     Education Details Educated in supine wand flexion and scaption    Person(s) Educated Patient    Methods Explanation;Handout    Comprehension Returned demonstration                 PT Long Term Goals - 11/10/21 1100       PT LONG TERM GOAL #1   Title Patient will demonstrate she has regained full shoulder ROM and function post op compared to baselines.    Time 4    Period Weeks    Status On-going    Target Date 12/08/21      PT LONG TERM GOAL #2   Title Patient will improve her DASH score to be back to zero for  improved overall upper extremity function.    Baseline zero pre-op and 61.36 post op    Time 4    Period Weeks    Status New    Target Date 12/08/21      PT LONG TERM GOAL #3   Title Patient will increase bilateral shoulder flexion to >/= 150 degrees for increased ease reaching overhead.    Baseline Right 111 post op    Time 4    Period Weeks    Status New    Target Date 12/08/21      PT LONG TERM GOAL #4   Title Patient will increase bilateral shoulder abduction to >/= 160 degrees for improved ability to obtain radiation positioning.    Baseline 116 degrees right side post op    Time 4    Period Weeks    Status New    Target Date 12/08/21      PT LONG TERM GOAL #5   Title Patient will be independent in her HEP for impvoed shoulder function.    Time 4    Period Weeks    Status New    Target Date 12/08/21                   Plan - 11/15/21 1306     Clinical Impression Statement performed soft tissue techniques to loosen tight muscles, MFR to right axillary/upper arm cording, PROM, and AAROM exs.  Pt with several small cords in right upper arm that were uncomfortable for pt, but felt much better after treatment.   She is progressing nicely with PROM although limited right greater than left. She had no problems with stargazer position and should do fine with radiation simulation.    Stability/Clinical Decision Making Stable/Uncomplicated    Rehab Potential Excellent    PT Frequency 2x / week    PT Duration 4 weeks    PT Treatment/Interventions ADLs/Self Care Home Management;Therapeutic exercise;Patient/family education    PT Next Visit Plan Begin ROM exerecises and PROM, wand exs, pulleys prn   PT Home Exercise Plan Post op HEP    Consulted and Agree with Plan of Care Patient;Family member/caregiver             Patient will benefit from skilled therapeutic intervention in order to improve the following deficits and impairments:  Postural dysfunction, Decreased  range of motion, Decreased knowledge of precautions, Impaired UE functional use, Pain, Increased edema, Decreased scar mobility  Visit Diagnosis: Malignant neoplasm of upper-inner quadrant of right breast in female, estrogen  receptor positive ([place])  Abnormal posture  Aftercare following surgery for neoplasm  Stiffness of left shoulder, not elsewhere classified  Stiffness of right shoulder, not elsewhere classified     Problem List Patient Active Problem List   Diagnosis Date Noted   S/P bilateral mastectomy 10/21/2021   Port-A-Cath in place 06/07/2021   Genetic testing 04/19/2021   Malignant neoplasm of upper-inner quadrant of right breast in female, estrogen receptor positive ([place]) 04/13/2021   Family history of prostate cancer    Family history of pancreatic cancer    Family history of breast cancer    Family history of lung cancer    Active labor 03/15/2016   Postpartum care following vaginal delivery 03/15/2016   SVD (spontaneous vaginal delivery) 01/18/2014   Labor and delivery, indication for care 01/17/2014   Antepartum bleeding, third trimester 01/01/2014   Placenta previa in second trimester 11/28/2013    Claris Pong, PT 11/15/2021, 2:01 PM  [facility] @ [facility] [place], [place], [address] Phone: [phone]   Fax:  [phone]  Name: Chelsea Wise MRN: [id number] Date of Birth: 02-12-1981

## 2021-11-15 NOTE — Patient Instructions (Signed)
SHOULDER: Flexion - Supine (Cane)        Cancer Rehab [phone]    Hold cane in both hands. Raise arms up overhead. Do not allow back to arch. Hold _5__ seconds. Do __5-10__ times; __1-2__ times a day.  1.hands shoulder width apart 2.hands slightly than shoulder width (V position)  Shoulder Blade Stretch    Clasp fingers behind head with elbows touching in front of face. Pull elbows back while pressing shoulder blades together. Relax and hold as tolerated, can place pillow under elbow here for comfort as needed and to allow for prolonged stretch.  Repeat __5__ times. Do __1-2__ sessions per day.    Copyright  VHI. All rights reserved.

## 2021-11-16 ENCOUNTER — Encounter: Payer: Self-pay | Admitting: Radiation Oncology

## 2021-11-17 ENCOUNTER — Ambulatory Visit: Payer: Federal, State, Local not specified - PPO

## 2021-11-17 ENCOUNTER — Other Ambulatory Visit: Payer: Self-pay

## 2021-11-17 DIAGNOSIS — R293 Abnormal posture: Secondary | ICD-10-CM

## 2021-11-17 DIAGNOSIS — Z483 Aftercare following surgery for neoplasm: Secondary | ICD-10-CM

## 2021-11-17 DIAGNOSIS — M25611 Stiffness of right shoulder, not elsewhere classified: Secondary | ICD-10-CM

## 2021-11-17 DIAGNOSIS — C50211 Malignant neoplasm of upper-inner quadrant of right female breast: Secondary | ICD-10-CM | POA: Diagnosis not present

## 2021-11-17 DIAGNOSIS — M25612 Stiffness of left shoulder, not elsewhere classified: Secondary | ICD-10-CM

## 2021-11-17 DIAGNOSIS — Z170 Estrogen receptor positive status [ER+]: Secondary | ICD-10-CM

## 2021-11-17 NOTE — Therapy (Signed)
[facility] @ [facility] [place], [place], [address] Phone: [phone]   Fax:  [phone]  Physical Therapy Treatment  Patient Details  Name: Chelsea Wise MRN: [id number] Date of Birth: 03-28-1981 Referring Provider (PT): Dr. Rolm Bookbinder   Encounter Date: 11/17/2021   PT End of Session - 11/17/21 0910     Visit Number 4    Number of Visits 10    Date for PT Re-Evaluation 12/08/21    PT Start Time 0905    PT Stop Time 0951    PT Time Calculation (min) 46 min    Activity Tolerance Patient tolerated treatment well    Behavior During Therapy [facility] for tasks assessed/performed             Past Medical History:  Diagnosis Date   Cancer ([place])    Breast   Family history of breast cancer    Family history of lung cancer    Family history of pancreatic cancer    Family history of prostate cancer    Medical history non-contributory    Placenta previa    SVD (spontaneous vaginal delivery) 01/18/2014    Past Surgical History:  Procedure Laterality Date   MASTECTOMY W/ SENTINEL NODE BIOPSY Right 10/21/2021   Procedure: RIGHT MASTECTOMY WITH RIGHT AXILLARY SENTINEL LYMPH NODE BIOPSY;  Surgeon: Rolm Bookbinder, MD;  Location: [place];  Service: General;  Laterality: Right;   NO PAST SURGERIES     PORT-A-CATH REMOVAL Left 10/21/2021   Procedure: REMOVAL PORT-A-CATH;  Surgeon: Rolm Bookbinder, MD;  Location: [place];  Service: General;  Laterality: Left;   PORTACATH PLACEMENT Left 05/09/2021   Procedure: INSERTION PORT-A-CATH;  Surgeon: Rolm Bookbinder, MD;  Location: [place];  Service: General;  Laterality: Left;   RADIOACTIVE SEED GUIDED AXILLARY SENTINEL LYMPH NODE Right 10/21/2021   Procedure: RADIOACTIVE SEED GUIDED RIGHT AXILLARY LYMPH NODE EXCISION;  Surgeon: Rolm Bookbinder, MD;  Location: [place];  Service: General;  Laterality: Right;    TOTAL MASTECTOMY Left 10/21/2021   Procedure: LEFT TOTAL MASTECTOMY;  Surgeon: Rolm Bookbinder, MD;  Location: [place];  Service: General;  Laterality: Left;   WISDOM TOOTH EXTRACTION      There were no vitals filed for this visit.   Subjective Assessment - 11/17/21 0904     Subjective I did the new exercises at home and they went well.  It doesn't feel as tight in my chest today and my right arm now.    Patient Stated Goals Get right arm back to normal    Currently in Pain? No/denies    Pain Score 0-No pain    Pain Onset 1 to 4 weeks ago    Pain Frequency Intermittent    Multiple Pain Sites No                               [facility] Adult PT Treatment/Exercise - 11/17/21 0001       Shoulder Exercises: Supine   Other Supine Exercises AROM bilateral shoulder flexion, scaption, and horizontal abduction x 5 B      Shoulder Exercises: Pulleys   Flexion 2 minutes    Scaption 1 minute    ABduction 2 minutes      Manual Therapy   Soft tissue mobilization Soft tissue to bilateral  pectorals, lats and bilateral UT in supine    Myofascial Release  Right axillary/upper arm area of cording    Passive ROM PROM bilateral shouder flexion, scaption, D2 flexion, abd, ER                         PT Long Term Goals - 11/10/21 1100       PT LONG TERM GOAL #1   Title Patient will demonstrate she has regained full shoulder ROM and function post op compared to baselines.    Time 4    Period Weeks    Status On-going    Target Date 12/08/21      PT LONG TERM GOAL #2   Title Patient will improve her DASH score to be back to zero for improved overall upper extremity function.    Baseline zero pre-op and 61.36 post op    Time 4    Period Weeks    Status New    Target Date 12/08/21      PT LONG TERM GOAL #3   Title Patient will increase bilateral shoulder flexion to >/= 150 degrees for increased ease reaching overhead.    Baseline Right  111 post op    Time 4    Period Weeks    Status New    Target Date 12/08/21      PT LONG TERM GOAL #4   Title Patient will increase bilateral shoulder abduction to >/= 160 degrees for improved ability to obtain radiation positioning.    Baseline 116 degrees right side post op    Time 4    Period Weeks    Status New    Target Date 12/08/21      PT LONG TERM GOAL #5   Title Patient will be independent in her HEP for impvoed shoulder function.    Time 4    Period Weeks    Status New    Target Date 12/08/21                   Plan - 11/17/21 0911     Clinical Impression Statement Performed MFR to cording on right and STM to bilateral UT, pectorals and lats.  Pt with greatly improved tissue tension and excellent improvement in bilateral PROM overall. She will do well with her radiation simulation tomorrow.  Very mild swelling at right lateral incision.  She will put foam back in her compression bra.    Stability/Clinical Decision Making Stable/Uncomplicated    Rehab Potential Excellent    PT Frequency 2x / week    PT Duration 4 weeks    PT Treatment/Interventions ADLs/Self Care Home Management;Therapeutic exercise;Patient/family education;Passive range of motion;Manual techniques;Manual lymph drainage    PT Next Visit Plan Begin ROM exerecises and PROM    PT Home Exercise Plan Post op HEP    Consulted and Agree with Plan of Care Patient;Family member/caregiver             Patient will benefit from skilled therapeutic intervention in order to improve the following deficits and impairments:  Postural dysfunction, Decreased range of motion, Decreased knowledge of precautions, Impaired UE functional use, Pain, Increased edema, Decreased scar mobility  Visit Diagnosis: Malignant neoplasm of upper-inner quadrant of right breast in female, estrogen receptor positive (HCC)  Abnormal posture  Aftercare following surgery for neoplasm  Stiffness of left shoulder, not  elsewhere classified  Stiffness of right shoulder, not elsewhere classified     Problem List Patient Active Problem List   Diagnosis Date Noted   S/P  bilateral mastectomy 10/21/2021   Port-A-Cath in place 06/07/2021   Genetic testing 04/19/2021   Malignant neoplasm of upper-inner quadrant of right breast in female, estrogen receptor positive ([place]) 04/13/2021   Family history of prostate cancer    Family history of pancreatic cancer    Family history of breast cancer    Family history of lung cancer    Active labor 03/15/2016   Postpartum care following vaginal delivery 03/15/2016   SVD (spontaneous vaginal delivery) 01/18/2014   Labor and delivery, indication for care 01/17/2014   Antepartum bleeding, third trimester 01/01/2014   Placenta previa in second trimester 11/28/2013    Claris Pong, PT 11/17/2021, 9:52 AM  [facility] @ [place], [place], [address] Phone: [phone]   Fax:  [phone]  Name: Chelsea Wise MRN: [id number] Date of Birth: [birth year]

## 2021-11-18 ENCOUNTER — Ambulatory Visit
Admission: RE | Admit: 2021-11-18 | Discharge: 2021-11-18 | Disposition: A | Discharge status: Home / Self Care | Payer: Federal, State, Local not specified - PPO | Source: Ambulatory Visit | Attending: Radiation Oncology | Admitting: Radiation Oncology

## 2021-11-18 DIAGNOSIS — C50211 Malignant neoplasm of upper-inner quadrant of right female breast: Secondary | ICD-10-CM | POA: Diagnosis not present

## 2021-11-21 ENCOUNTER — Encounter: Payer: Self-pay | Admitting: *Deleted

## 2021-11-21 ENCOUNTER — Other Ambulatory Visit: Payer: Self-pay

## 2021-11-21 ENCOUNTER — Ambulatory Visit: Payer: Federal, State, Local not specified - PPO

## 2021-11-21 DIAGNOSIS — R293 Abnormal posture: Secondary | ICD-10-CM

## 2021-11-21 DIAGNOSIS — M25611 Stiffness of right shoulder, not elsewhere classified: Secondary | ICD-10-CM

## 2021-11-21 DIAGNOSIS — M25612 Stiffness of left shoulder, not elsewhere classified: Secondary | ICD-10-CM

## 2021-11-21 DIAGNOSIS — C50211 Malignant neoplasm of upper-inner quadrant of right female breast: Secondary | ICD-10-CM | POA: Diagnosis not present

## 2021-11-21 DIAGNOSIS — Z483 Aftercare following surgery for neoplasm: Secondary | ICD-10-CM

## 2021-11-21 NOTE — Therapy (Signed)
[facility] @ [place], [place], [address] Phone: [phone]   Fax:  [phone]  Physical Therapy Treatment  Patient Details  Name: Chelsea Wise MRN: [id number] Date of Birth: [birth year] Referring Provider (PT): Dr. Rolm Bookbinder   Encounter Date: 11/21/2021   PT End of Session - 11/21/21 0811     Visit Number 5    Number of Visits 10    Date for PT Re-Evaluation 12/08/21    PT Start Time 0804    PT Stop Time 7902    PT Time Calculation (min) 55 min    Activity Tolerance Patient tolerated treatment well    Behavior During Therapy [facility] for tasks assessed/performed             Past Medical History:  Diagnosis Date   Cancer ([place])    Breast   Family history of breast cancer    Family history of lung cancer    Family history of pancreatic cancer    Family history of prostate cancer    Medical history non-contributory    Placenta previa    SVD (spontaneous vaginal delivery) 01/18/2014    Past Surgical History:  Procedure Laterality Date   MASTECTOMY W/ SENTINEL NODE BIOPSY Right 10/21/2021   Procedure: RIGHT MASTECTOMY WITH RIGHT AXILLARY SENTINEL LYMPH NODE BIOPSY;  Surgeon: Rolm Bookbinder, MD;  Location: [place];  Service: General;  Laterality: Right;   NO PAST SURGERIES     PORT-A-CATH REMOVAL Left 10/21/2021   Procedure: REMOVAL PORT-A-CATH;  Surgeon: Rolm Bookbinder, MD;  Location: [place];  Service: General;  Laterality: Left;   PORTACATH PLACEMENT Left 05/09/2021   Procedure: INSERTION PORT-A-CATH;  Surgeon: Rolm Bookbinder, MD;  Location: [place];  Service: General;  Laterality: Left;   RADIOACTIVE SEED GUIDED AXILLARY SENTINEL LYMPH NODE Right 10/21/2021   Procedure: RADIOACTIVE SEED GUIDED RIGHT AXILLARY LYMPH NODE EXCISION;  Surgeon: Rolm Bookbinder, MD;  Location: [place];  Service: General;  Laterality: Right;    TOTAL MASTECTOMY Left 10/21/2021   Procedure: LEFT TOTAL MASTECTOMY;  Surgeon: Rolm Bookbinder, MD;  Location: [place];  Service: General;  Laterality: Left;   WISDOM TOOTH EXTRACTION      There were no vitals filed for this visit.   Subjective Assessment - 11/21/21 0807     Subjective I'm feelng better in my Rt shoulder. Right now I'm just a little tender to touch in the medial aspect of my Rt arm.    Pertinent History Patient was diagnosed on 04/11/2021 with right grade II invasive ductal carcinoma breast cancer. It measures 4.5 cm and is located in the upper inner quadrant. It is ER positive, weakly PR positive, and HER2 equivocal. Ki67 is 25%. She has 2 abnormal appearing axillary lymph nodes. One was biopsied and found to be positive.    Patient Stated Goals Get right arm back to normal    Currently in Pain? No/denies                               [facility] Adult PT Treatment/Exercise - 11/21/21 0001       Shoulder Exercises: Pulleys   Flexion 2 minutes    Flexion Limitations Pt with excellent technique    ABduction 2 minutes      Shoulder Exercises: Therapy Ball   Flexion Both;10 reps   forward lean into end of stretch  Flexion Limitations pt returned therapist demo    ABduction Right;5 reps      Manual Therapy   Soft tissue mobilization Soft tissue to Rt  pectoralis, lats and UT in supine    Myofascial Release Right axillary/upper arm area of cording    Passive ROM In Supine: PROM of Rt shouder into flexion, D2 flexion, abd                          PT Long Term Goals - 11/10/21 1100       PT LONG TERM GOAL #1   Title Patient will demonstrate she has regained full shoulder ROM and function post op compared to baselines.    Time 4    Period Weeks    Status On-going    Target Date 12/08/21      PT LONG TERM GOAL #2   Title Patient will improve her DASH score to be back to zero for improved overall upper extremity  function.    Baseline zero pre-op and 61.36 post op    Time 4    Period Weeks    Status New    Target Date 12/08/21      PT LONG TERM GOAL #3   Title Patient will increase bilateral shoulder flexion to >/= 150 degrees for increased ease reaching overhead.    Baseline Right 111 post op    Time 4    Period Weeks    Status New    Target Date 12/08/21      PT LONG TERM GOAL #4   Title Patient will increase bilateral shoulder abduction to >/= 160 degrees for improved ability to obtain radiation positioning.    Baseline 116 degrees right side post op    Time 4    Period Weeks    Status New    Target Date 12/08/21      PT LONG TERM GOAL #5   Title Patient will be independent in her HEP for impvoed shoulder function.    Time 4    Period Weeks    Status New    Target Date 12/08/21                   Plan - 11/21/21 0811     Clinical Impression Statement Overall pt seems to be progressing well. Her ROM has improved well and her biggest c/o today was mild soreness that is tender totouch at her Rt medial upper arm. Progressed her exs with ball roll up wall and was able to feel a good stretch with this without increased discomfort. Also added modified downward dog on wall which she also tolerated well. Then continued with maual therapy working to decrease remaining tightness at her Rt axilla and pectoralis. Rt side only as pt reports her Lt feeling much improved and demonstrates full A/ROM. Her radiation starts next week.    Stability/Clinical Decision Making Stable/Uncomplicated    Rehab Potential Excellent    PT Frequency 2x / week    PT Duration 4 weeks    PT Treatment/Interventions ADLs/Self Care Home Management;Therapeutic exercise;Patient/family education;Passive range of motion;Manual techniques;Manual lymph drainage    PT Next Visit Plan Cont ROM exerecises and PROM; progress pt as able    PT Home Exercise Plan Post op HEP    Consulted and Agree with Plan of Care Patient              Patient will benefit from skilled therapeutic intervention  in order to improve the following deficits and impairments:  Postural dysfunction, Decreased range of motion, Decreased knowledge of precautions, Impaired UE functional use, Pain, Increased edema, Decreased scar mobility  Visit Diagnosis: Malignant neoplasm of upper-inner quadrant of right breast in female, estrogen receptor positive (HCC)  Abnormal posture  Aftercare following surgery for neoplasm  Stiffness of left shoulder, not elsewhere classified  Stiffness of right shoulder, not elsewhere classified     Problem List Patient Active Problem List   Diagnosis Date Noted   S/P bilateral mastectomy 10/21/2021   Port-A-Cath in place 06/07/2021   Genetic testing 04/19/2021   Malignant neoplasm of upper-inner quadrant of right breast in female, estrogen receptor positive ([place]) 04/13/2021   Family history of prostate cancer    Family history of pancreatic cancer    Family history of breast cancer    Family history of lung cancer    Active labor 03/15/2016   Postpartum care following vaginal delivery 03/15/2016   SVD (spontaneous vaginal delivery) 01/18/2014   Labor and delivery, indication for care 01/17/2014   Antepartum bleeding, third trimester 01/01/2014   Placenta previa in second trimester 11/28/2013    Otelia Limes, PTA 11/21/2021, 9:01 AM  [facility] @ [place], [place], [address] Phone: [phone]   Fax:  [phone]  Name: Chelsea Wise MRN: [id number] Date of Birth: August 10, 1981

## 2021-11-23 DIAGNOSIS — C50211 Malignant neoplasm of upper-inner quadrant of right female breast: Secondary | ICD-10-CM | POA: Diagnosis not present

## 2021-11-30 ENCOUNTER — Ambulatory Visit
Admission: RE | Admit: 2021-11-30 | Discharge: 2021-11-30 | Disposition: A | Discharge status: Home / Self Care | Payer: Federal, State, Local not specified - PPO | Source: Ambulatory Visit | Attending: Radiation Oncology | Admitting: Radiation Oncology

## 2021-11-30 ENCOUNTER — Other Ambulatory Visit: Payer: Self-pay

## 2021-11-30 ENCOUNTER — Ambulatory Visit: Payer: Federal, State, Local not specified - PPO

## 2021-11-30 DIAGNOSIS — Z483 Aftercare following surgery for neoplasm: Secondary | ICD-10-CM

## 2021-11-30 DIAGNOSIS — C50211 Malignant neoplasm of upper-inner quadrant of right female breast: Secondary | ICD-10-CM

## 2021-11-30 DIAGNOSIS — M25612 Stiffness of left shoulder, not elsewhere classified: Secondary | ICD-10-CM

## 2021-11-30 DIAGNOSIS — R293 Abnormal posture: Secondary | ICD-10-CM

## 2021-11-30 NOTE — Therapy (Addendum)
[facility] @ [place], [place], [address] Phone: [phone]   Fax:  [phone]  Physical Therapy Treatment  Patient Details  Name: Chelsea Wise MRN: [id number] Date of Birth: 06-16-1981 Referring Provider (PT): Dr. Rolm Bookbinder   Encounter Date: 11/30/2021   PT End of Session - 11/30/21 1000     Visit Number 6    Number of Visits 10    Date for PT Re-Evaluation 12/08/21    PT Start Time 0906    PT Stop Time 1001    PT Time Calculation (min) 55 min    Activity Tolerance Patient tolerated treatment well    Behavior During Therapy [facility] for tasks assessed/performed             Past Medical History:  Diagnosis Date   Cancer ([place])    Breast   Family history of breast cancer    Family history of lung cancer    Family history of pancreatic cancer    Family history of prostate cancer    Medical history non-contributory    Placenta previa    SVD (spontaneous vaginal delivery) 01/18/2014    Past Surgical History:  Procedure Laterality Date   MASTECTOMY W/ SENTINEL NODE BIOPSY Right 10/21/2021   Procedure: RIGHT MASTECTOMY WITH RIGHT AXILLARY SENTINEL LYMPH NODE BIOPSY;  Surgeon: Rolm Bookbinder, MD;  Location: [place];  Service: General;  Laterality: Right;   NO PAST SURGERIES     PORT-A-CATH REMOVAL Left 10/21/2021   Procedure: REMOVAL PORT-A-CATH;  Surgeon: Rolm Bookbinder, MD;  Location: [place];  Service: General;  Laterality: Left;   PORTACATH PLACEMENT Left 05/09/2021   Procedure: INSERTION PORT-A-CATH;  Surgeon: Rolm Bookbinder, MD;  Location: [place];  Service: General;  Laterality: Left;   RADIOACTIVE SEED GUIDED AXILLARY SENTINEL LYMPH NODE Right 10/21/2021   Procedure: RADIOACTIVE SEED GUIDED RIGHT AXILLARY LYMPH NODE EXCISION;  Surgeon: Rolm Bookbinder, MD;  Location: [facility];  Service: General;  Laterality: Right;    TOTAL MASTECTOMY Left 10/21/2021   Procedure: LEFT TOTAL MASTECTOMY;  Surgeon: Rolm Bookbinder, MD;  Location: [place];  Service: General;  Laterality: Left;   WISDOM TOOTH EXTRACTION      There were no vitals filed for this visit.   Subjective Assessment - 11/30/21 0913     Subjective The tenderness at the inside of my arm is a little better this week. The only time my arm is bothering me now is when I sleep. I've just noticed a little soreness at the back of my arm. It's not bad, it just wakes me up sometimes.    Pertinent History Patient was diagnosed on 04/11/2021 with right grade II invasive ductal carcinoma breast cancer. It measures 4.5 cm and is located in the upper inner quadrant. It is ER positive, weakly PR positive, and HER2 equivocal. Ki67 is 25%. She has 2 abnormal appearing axillary lymph nodes. One was biopsied and found to be positive.    Patient Stated Goals Get right arm back to normal    Currently in Pain? No/denies                               [facility] Adult PT Treatment/Exercise - 11/30/21 0001       Shoulder Exercises: Standing   Other Standing Exercises Bil UE 3 way raises with 1 lb returning therapist demo into flexion,  scaption and abd (except no weights with abd) to shoulder height only with core engaged and head/shoulders against wall, 10x each      Shoulder Exercises: Pulleys   Flexion 2 minutes    ABduction 2 minutes      Shoulder Exercises: Therapy Ball   Flexion Both;10 reps   1# added to wrist, forward lean into end of stretch   ABduction Right;10 reps   1# added to wrist, same side lean into end of stretch     Shoulder Exercises: Stretch   Other Shoulder Stretches Modified downward dog on wall 5x, 5 sec holds returning therapist demo      Manual Therapy   Soft tissue mobilization Soft tissue to Rt  pectoralis and lats in supine    Myofascial Release Right axillary/upper arm area of cording but this was very  mildly palpable today    Passive ROM In Supine: PROM of Rt shouder into flexion, D2 flexion, abd                          PT Long Term Goals - 11/10/21 1100       PT LONG TERM GOAL #1   Title Patient will demonstrate she has regained full shoulder ROM and function post op compared to baselines.    Time 4    Period Weeks    Status On-going    Target Date 12/08/21      PT LONG TERM GOAL #2   Title Patient will improve her DASH score to be back to zero for improved overall upper extremity function.    Baseline zero pre-op and 61.36 post op    Time 4    Period Weeks    Status New    Target Date 12/08/21      PT LONG TERM GOAL #3   Title Patient will increase bilateral shoulder flexion to >/= 150 degrees for increased ease reaching overhead.    Baseline Right 111 post op    Time 4    Period Weeks    Status New    Target Date 12/08/21      PT LONG TERM GOAL #4   Title Patient will increase bilateral shoulder abduction to >/= 160 degrees for improved ability to obtain radiation positioning.    Baseline 116 degrees right side post op    Time 4    Period Weeks    Status New    Target Date 12/08/21      PT LONG TERM GOAL #5   Title Patient will be independent in her HEP for impvoed shoulder function.    Time 4    Period Weeks    Status New    Target Date 12/08/21                   Plan - 11/30/21 1001     Clinical Impression Statement Continued with AA/ROM and progressed pt with this adding 1 lb to her wrist and then also adding bil UE 3 way raises. Pt did well with these but did have to do last set (abd) without weights due to muscle fatigue. Then continued with end P/ROM stretching and STM with MFR working to loosen fascial restrictions at Rt upper chest and axilla. Cording only mildly palpable today. Was able to remove pts steri strips for her as they were already falling off. Her incisions loon excellent and well healed. She did have one visible  stitch so made  her aware of this.    Stability/Clinical Decision Making Stable/Uncomplicated    Rehab Potential Excellent    PT Frequency 2x / week    PT Duration 4 weeks    PT Treatment/Interventions ADLs/Self Care Home Management;Therapeutic exercise;Patient/family education;Passive range of motion;Manual techniques;Manual lymph drainage    PT Next Visit Plan **GIVE SCRIPT FOR COMPRESSION SLEEVE** Cont ROM exerecises and PROM; progress pt as able; how was radiation?    PT Home Exercise Plan Post op HEP    Consulted and Agree with Plan of Care Patient             Patient will benefit from skilled therapeutic intervention in order to improve the following deficits and impairments:  Postural dysfunction, Decreased range of motion, Decreased knowledge of precautions, Impaired UE functional use, Pain, Increased edema, Decreased scar mobility  Visit Diagnosis: Malignant neoplasm of upper-inner quadrant of right breast in female, estrogen receptor positive ([place])  Abnormal posture  Aftercare following surgery for neoplasm  Stiffness of left shoulder, not elsewhere classified     Problem List Patient Active Problem List   Diagnosis Date Noted   S/P bilateral mastectomy 10/21/2021   Port-A-Cath in place 06/07/2021   Genetic testing 04/19/2021   Malignant neoplasm of upper-inner quadrant of right breast in female, estrogen receptor positive ([place]) 04/13/2021   Family history of prostate cancer    Family history of pancreatic cancer    Family history of breast cancer    Family history of lung cancer    Active labor 03/15/2016   Postpartum care following vaginal delivery 03/15/2016   SVD (spontaneous vaginal delivery) 01/18/2014   Labor and delivery, indication for care 01/17/2014   Antepartum bleeding, third trimester 01/01/2014   Placenta previa in second trimester 11/28/2013    Otelia Limes, PTA 11/30/2021, 10:04 AM  [facility] @ [place], [place], [address] Phone: [phone]   Fax:  [phone]  Name: Zeidy Tayag MRN: [id number] Date of Birth: 18-Sep-1981

## 2021-12-01 ENCOUNTER — Ambulatory Visit
Admission: RE | Admit: 2021-12-01 | Discharge: 2021-12-01 | Disposition: A | Discharge status: Home / Self Care | Payer: Federal, State, Local not specified - PPO | Source: Ambulatory Visit | Attending: Radiation Oncology | Admitting: Radiation Oncology

## 2021-12-01 DIAGNOSIS — C50211 Malignant neoplasm of upper-inner quadrant of right female breast: Secondary | ICD-10-CM | POA: Diagnosis not present

## 2021-12-02 ENCOUNTER — Ambulatory Visit
Admission: RE | Admit: 2021-12-02 | Discharge: 2021-12-02 | Disposition: A | Discharge status: Home / Self Care | Payer: Federal, State, Local not specified - PPO | Source: Ambulatory Visit | Attending: Radiation Oncology | Admitting: Radiation Oncology

## 2021-12-02 ENCOUNTER — Other Ambulatory Visit: Payer: Self-pay

## 2021-12-02 DIAGNOSIS — C50211 Malignant neoplasm of upper-inner quadrant of right female breast: Secondary | ICD-10-CM | POA: Diagnosis not present

## 2021-12-06 ENCOUNTER — Ambulatory Visit
Admission: RE | Admit: 2021-12-06 | Discharge: 2021-12-06 | Disposition: A | Discharge status: Home / Self Care | Payer: Federal, State, Local not specified - PPO | Source: Ambulatory Visit | Attending: Radiation Oncology | Admitting: Radiation Oncology

## 2021-12-06 ENCOUNTER — Ambulatory Visit: Payer: Federal, State, Local not specified - PPO | Attending: General Surgery | Admitting: Rehabilitation

## 2021-12-06 ENCOUNTER — Other Ambulatory Visit: Payer: Self-pay

## 2021-12-06 DIAGNOSIS — R293 Abnormal posture: Secondary | ICD-10-CM | POA: Diagnosis present

## 2021-12-06 DIAGNOSIS — C50211 Malignant neoplasm of upper-inner quadrant of right female breast: Secondary | ICD-10-CM | POA: Insufficient documentation

## 2021-12-06 DIAGNOSIS — M25612 Stiffness of left shoulder, not elsewhere classified: Secondary | ICD-10-CM | POA: Diagnosis present

## 2021-12-06 DIAGNOSIS — Z170 Estrogen receptor positive status [ER+]: Secondary | ICD-10-CM | POA: Diagnosis present

## 2021-12-06 DIAGNOSIS — M25611 Stiffness of right shoulder, not elsewhere classified: Secondary | ICD-10-CM | POA: Diagnosis present

## 2021-12-06 DIAGNOSIS — Z483 Aftercare following surgery for neoplasm: Secondary | ICD-10-CM

## 2021-12-06 MED ORDER — ALRA NON-METALLIC DEODORANT (RAD-ONC)
1.0000 "application " | Freq: Once | TOPICAL | Status: AC
  Administered 2021-12-06: 1 via TOPICAL

## 2021-12-06 MED ORDER — RADIAPLEXRX EX GEL: Freq: Once | CUTANEOUS | Status: AC

## 2021-12-06 NOTE — Progress Notes (Signed)
Pt here for patient teaching.    Pt given skin care instructions, Alra deodorant, and Radiaplex gel.    Reviewed areas of pertinence such as fatigue, hair loss, skin changes, breast tenderness, and breast swelling .   Pt able to give teach back of to pat skin and use unscented/gentle soap,apply Radiaplex bid, avoid applying anything to skin within 4 hours of treatment, avoid wearing an under wire bra, and to use an electric razor if they must shave.   Pt verbalizes understanding of information given and will contact nursing with any questions or concerns.    [email or web address]

## 2021-12-06 NOTE — Therapy (Signed)
[facility] @ [place], [place], [address] Phone: [phone]   Fax:  [phone]  Physical Therapy Treatment  Patient Details  Name: Chelsea Wise MRN: [id number] Date of Birth: 08-Oct-1981 Referring Provider (PT): Dr. Rolm Bookbinder   Encounter Date: 12/06/2021   PT End of Session - 12/06/21 1148     Visit Number 7    Number of Visits 10    PT Start Time 1100    PT Stop Time 6962    PT Time Calculation (min) 48 min    Activity Tolerance Patient tolerated treatment well    Behavior During Therapy [facility] for tasks assessed/performed             Past Medical History:  Diagnosis Date   Cancer ([place])    Breast   Family history of breast cancer    Family history of lung cancer    Family history of pancreatic cancer    Family history of prostate cancer    Medical history non-contributory    Placenta previa    SVD (spontaneous vaginal delivery) 01/18/2014    Past Surgical History:  Procedure Laterality Date   MASTECTOMY W/ SENTINEL NODE BIOPSY Right 10/21/2021   Procedure: RIGHT MASTECTOMY WITH RIGHT AXILLARY SENTINEL LYMPH NODE BIOPSY;  Surgeon: Rolm Bookbinder, MD;  Location: [facility];  Service: General;  Laterality: Right;   NO PAST SURGERIES     PORT-A-CATH REMOVAL Left 10/21/2021   Procedure: REMOVAL PORT-A-CATH;  Surgeon: Rolm Bookbinder, MD;  Location: [facility];  Service: General;  Laterality: Left;   PORTACATH PLACEMENT Left 05/09/2021   Procedure: INSERTION PORT-A-CATH;  Surgeon: Rolm Bookbinder, MD;  Location: [facility];  Service: General;  Laterality: Left;   RADIOACTIVE SEED GUIDED AXILLARY SENTINEL LYMPH NODE Right 10/21/2021   Procedure: RADIOACTIVE SEED GUIDED RIGHT AXILLARY LYMPH NODE EXCISION;  Surgeon: Rolm Bookbinder, MD;  Location: [facility];  Service: General;  Laterality: Right;   TOTAL MASTECTOMY Left 10/21/2021    Procedure: LEFT TOTAL MASTECTOMY;  Surgeon: Rolm Bookbinder, MD;  Location: [facility];  Service: General;  Laterality: Left;   WISDOM TOOTH EXTRACTION      There were no vitals filed for this visit.   Subjective Assessment - 12/06/21 1107     Subjective My neck doesn't like the radiation. but my arm is Okay    Pertinent History Patient was diagnosed on 04/11/2021 with right grade II invasive ductal carcinoma breast cancer. It measures 4.5 cm and is located in the upper inner quadrant. It is ER positive, weakly PR positive, and HER2 equivocal. Ki67 is 25%. She has 2 abnormal appearing axillary lymph nodes. One was biopsied and found to be positive.    Currently in Pain? No/denies                               [facility] Adult PT Treatment/Exercise - 12/06/21 0001       Shoulder Exercises: Pulleys   Flexion 2 minutes    ABduction 2 minutes      Shoulder Exercises: Therapy Ball   Flexion Both;10 reps    ABduction Right;10 reps;Both      Manual Therapy   Manual therapy comments STM held today due to radiation post PT    Myofascial Release Right axillary/upper arm area of cording    Passive ROM In Supine: PROM of Rt shouder into flexion,  D2 flexion, abd                          PT Long Term Goals - 11/10/21 1100       PT LONG TERM GOAL #1   Title Patient will demonstrate she has regained full shoulder ROM and function post op compared to baselines.    Time 4    Period Weeks    Status On-going    Target Date 12/08/21      PT LONG TERM GOAL #2   Title Patient will improve her DASH score to be back to zero for improved overall upper extremity function.    Baseline zero pre-op and 61.36 post op    Time 4    Period Weeks    Status New    Target Date 12/08/21      PT LONG TERM GOAL #3   Title Patient will increase bilateral shoulder flexion to >/= 150 degrees for increased ease reaching overhead.    Baseline Right 111 post op     Time 4    Period Weeks    Status New    Target Date 12/08/21      PT LONG TERM GOAL #4   Title Patient will increase bilateral shoulder abduction to >/= 160 degrees for improved ability to obtain radiation positioning.    Baseline 116 degrees right side post op    Time 4    Period Weeks    Status New    Target Date 12/08/21      PT LONG TERM GOAL #5   Title Patient will be independent in her HEP for impvoed shoulder function.    Time 4    Period Weeks    Status New    Target Date 12/08/21                   Plan - 12/06/21 1148     Clinical Impression Statement Pt reported some return of upper arm cording over the weekend after starting radiation but improved post MT today.  Gave pt script and info for a special place today for a prophylactive sleeve that could help some of the cording due to 7 LN out.    PT Frequency 2x / week    PT Duration 4 weeks    PT Treatment/Interventions ADLs/Self Care Home Management;Therapeutic exercise;Patient/family education;Passive range of motion;Manual techniques;Manual lymph drainage    PT Next Visit Plan Cont ROM exercises and MT for cording progress pt as able    Consulted and Agree with Plan of Care Patient             Patient will benefit from skilled therapeutic intervention in order to improve the following deficits and impairments:  Postural dysfunction, Decreased range of motion, Decreased knowledge of precautions, Impaired UE functional use, Pain, Increased edema, Decreased scar mobility  Visit Diagnosis: Malignant neoplasm of upper-inner quadrant of right breast in female, estrogen receptor positive ([facility])  Abnormal posture  Aftercare following surgery for neoplasm  Stiffness of left shoulder, not elsewhere classified  Stiffness of right shoulder, not elsewhere classified     Problem List Patient Active Problem List   Diagnosis Date Noted   S/P bilateral mastectomy 10/21/2021   Port-A-Cath in place  06/07/2021   Genetic testing 04/19/2021   Malignant neoplasm of upper-inner quadrant of right breast in female, estrogen receptor positive ([place]) 04/13/2021   Family history of prostate cancer    Family history  of pancreatic cancer    Family history of breast cancer    Family history of lung cancer    Active labor 03/15/2016   Postpartum care following vaginal delivery 03/15/2016   SVD (spontaneous vaginal delivery) 01/18/2014   Labor and delivery, indication for care 01/17/2014   Antepartum bleeding, third trimester 01/01/2014   Placenta previa in second trimester 11/28/2013    Stark Bray, PT 12/06/2021, 11:53 AM  [facility] @ [place], [place], [address] Phone: [phone]   Fax:  [phone]  Name: Chelsea Wise MRN: [id number] Date of Birth: [birth year]

## 2021-12-07 ENCOUNTER — Ambulatory Visit
Admission: RE | Admit: 2021-12-07 | Discharge: 2021-12-07 | Disposition: A | Discharge status: Home / Self Care | Payer: Federal, State, Local not specified - PPO | Source: Ambulatory Visit | Attending: Radiation Oncology | Admitting: Radiation Oncology

## 2021-12-07 DIAGNOSIS — C50211 Malignant neoplasm of upper-inner quadrant of right female breast: Secondary | ICD-10-CM | POA: Diagnosis not present

## 2021-12-08 ENCOUNTER — Ambulatory Visit: Payer: Federal, State, Local not specified - PPO | Admitting: Rehabilitation

## 2021-12-08 ENCOUNTER — Ambulatory Visit
Admission: RE | Admit: 2021-12-08 | Discharge: 2021-12-08 | Disposition: A | Discharge status: Home / Self Care | Payer: Federal, State, Local not specified - PPO | Source: Ambulatory Visit | Attending: Radiation Oncology | Admitting: Radiation Oncology

## 2021-12-08 ENCOUNTER — Other Ambulatory Visit: Payer: Self-pay

## 2021-12-08 ENCOUNTER — Encounter: Payer: Self-pay | Admitting: Rehabilitation

## 2021-12-08 DIAGNOSIS — M25612 Stiffness of left shoulder, not elsewhere classified: Secondary | ICD-10-CM

## 2021-12-08 DIAGNOSIS — C50211 Malignant neoplasm of upper-inner quadrant of right female breast: Secondary | ICD-10-CM

## 2021-12-08 DIAGNOSIS — R293 Abnormal posture: Secondary | ICD-10-CM

## 2021-12-08 DIAGNOSIS — M25611 Stiffness of right shoulder, not elsewhere classified: Secondary | ICD-10-CM

## 2021-12-08 DIAGNOSIS — Z483 Aftercare following surgery for neoplasm: Secondary | ICD-10-CM

## 2021-12-08 NOTE — Therapy (Signed)
[facility] @ [place], [place], [address] Phone: [phone]   Fax:  [phone]  Physical Therapy Treatment  Patient Details  Name: Chelsea Wise MRN: [id number] Date of Birth: Jul 19, 1981 Referring Provider (PT): Dr. Rolm Bookbinder   Encounter Date: 12/08/2021   PT End of Session - 12/08/21 1415     Visit Number 8    Number of Visits 10    Date for PT Re-Evaluation 12/08/21    PT Start Time 4166    PT Stop Time 1416    PT Time Calculation (min) 44 min    Activity Tolerance Patient tolerated treatment well    Behavior During Therapy [facility] for tasks assessed/performed             Past Medical History:  Diagnosis Date   Cancer ([place])    Breast   Family history of breast cancer    Family history of lung cancer    Family history of pancreatic cancer    Family history of prostate cancer    Medical history non-contributory    Placenta previa    SVD (spontaneous vaginal delivery) 01/18/2014    Past Surgical History:  Procedure Laterality Date   MASTECTOMY W/ SENTINEL NODE BIOPSY Right 10/21/2021   Procedure: RIGHT MASTECTOMY WITH RIGHT AXILLARY SENTINEL LYMPH NODE BIOPSY;  Surgeon: Rolm Bookbinder, MD;  Location: [facility];  Service: General;  Laterality: Right;   NO PAST SURGERIES     PORT-A-CATH REMOVAL Left 10/21/2021   Procedure: REMOVAL PORT-A-CATH;  Surgeon: Rolm Bookbinder, MD;  Location: [facility];  Service: General;  Laterality: Left;   PORTACATH PLACEMENT Left 05/09/2021   Procedure: INSERTION PORT-A-CATH;  Surgeon: Rolm Bookbinder, MD;  Location: [facility];  Service: General;  Laterality: Left;   RADIOACTIVE SEED GUIDED AXILLARY SENTINEL LYMPH NODE Right 10/21/2021   Procedure: RADIOACTIVE SEED GUIDED RIGHT AXILLARY LYMPH NODE EXCISION;  Surgeon: Rolm Bookbinder, MD;  Location: [facility];  Service: General;  Laterality: Right;   TOTAL  MASTECTOMY Left 10/21/2021   Procedure: LEFT TOTAL MASTECTOMY;  Surgeon: Rolm Bookbinder, MD;  Location: [facility];  Service: General;  Laterality: Left;   WISDOM TOOTH EXTRACTION      There were no vitals filed for this visit.   Subjective Assessment - 12/08/21 1333     Subjective The cord is not as bad    Pertinent History Patient was diagnosed on 04/11/2021 with right grade II invasive ductal carcinoma breast cancer. It measures 4.5 cm and is located in the upper inner quadrant. It is ER positive, weakly PR positive, and HER2 equivocal. Ki67 is 25%. She has 2 abnormal appearing axillary lymph nodes. One was biopsied and found to be positive.    Patient Stated Goals Get right arm back to normal    Currently in Pain? No/denies                               [facility] Adult PT Treatment/Exercise - 12/08/21 0001       Shoulder Exercises: Standing   Row 15 reps;Both    Theraband Level (Shoulder Row) Level 1 (Yellow)    Other Standing Exercises Bil UE 3 way raises with 1 lb returning therapist demo into flexion, scaption and abd (except no weights with abd) to shoulder height only with core engaged and head/shoulders against wall, 10x each  Shoulder Exercises: Pulleys   Flexion 2 minutes    ABduction 2 minutes      Shoulder Exercises: Therapy Ball   Flexion Both;10 reps    ABduction 10 reps;Both      Manual Therapy   Soft tissue mobilization Soft tissue to Rt  pectoralis and lats in supine    Myofascial Release Right axillary/upper arm area of cording    Passive ROM In Supine: PROM of Rt shouder into flexion, D2 flexion, abd                          PT Long Term Goals - 11/10/21 1100       PT LONG TERM GOAL #1   Title Patient will demonstrate she has regained full shoulder ROM and function post op compared to baselines.    Time 4    Period Weeks    Status On-going    Target Date 12/08/21      PT LONG TERM GOAL #2    Title Patient will improve her DASH score to be back to zero for improved overall upper extremity function.    Baseline zero pre-op and 61.36 post op    Time 4    Period Weeks    Status New    Target Date 12/08/21      PT LONG TERM GOAL #3   Title Patient will increase bilateral shoulder flexion to >/= 150 degrees for increased ease reaching overhead.    Baseline Right 111 post op    Time 4    Period Weeks    Status New    Target Date 12/08/21      PT LONG TERM GOAL #4   Title Patient will increase bilateral shoulder abduction to >/= 160 degrees for improved ability to obtain radiation positioning.    Baseline 116 degrees right side post op    Time 4    Period Weeks    Status New    Target Date 12/08/21      PT LONG TERM GOAL #5   Title Patient will be independent in her HEP for impvoed shoulder function.    Time 4    Period Weeks    Status New    Target Date 12/08/21                   Plan - 12/08/21 1416     Clinical Impression Statement Cording was minimized today and not as apparent or palpable. .  Had a small puffy spot pt noted today which seemed to be where the cording and the incision approximated and was appearing puffy in between.    PT Frequency 2x / week    PT Duration 4 weeks    PT Treatment/Interventions ADLs/Self Care Home Management;Therapeutic exercise;Patient/family education;Passive range of motion;Manual techniques;Manual lymph drainage    PT Next Visit Plan Cont ROM exercises and MT for cording progress pt as able - supine scap for HEP? check AROM?    Consulted and Agree with Plan of Care Patient             Patient will benefit from skilled therapeutic intervention in order to improve the following deficits and impairments:     Visit Diagnosis: Malignant neoplasm of upper-inner quadrant of right breast in female, estrogen receptor positive ([place])  Aftercare following surgery for neoplasm  Abnormal posture  Stiffness of left  shoulder, not elsewhere classified  Stiffness of right shoulder, not elsewhere classified  Problem List Patient Active Problem List   Diagnosis Date Noted   S/P bilateral mastectomy 10/21/2021   Port-A-Cath in place 06/07/2021   Genetic testing 04/19/2021   Malignant neoplasm of upper-inner quadrant of right breast in female, estrogen receptor positive ([place]) 04/13/2021   Family history of prostate cancer    Family history of pancreatic cancer    Family history of breast cancer    Family history of lung cancer    Active labor 03/15/2016   Postpartum care following vaginal delivery 03/15/2016   SVD (spontaneous vaginal delivery) 01/18/2014   Labor and delivery, indication for care 01/17/2014   Antepartum bleeding, third trimester 01/01/2014   Placenta previa in second trimester 11/28/2013    Stark Bray, PT 12/08/2021, 2:19 PM  [facility] @ [place], [place], [address] Phone: [phone]   Fax:  [phone]  Name: Chelsea Wise MRN: [id number] Date of Birth: [birth year]

## 2021-12-09 ENCOUNTER — Other Ambulatory Visit: Payer: Self-pay

## 2021-12-09 ENCOUNTER — Ambulatory Visit
Admission: RE | Admit: 2021-12-09 | Discharge: 2021-12-09 | Disposition: A | Discharge status: Home / Self Care | Payer: Federal, State, Local not specified - PPO | Source: Ambulatory Visit | Attending: Radiation Oncology | Admitting: Radiation Oncology

## 2021-12-09 DIAGNOSIS — C50211 Malignant neoplasm of upper-inner quadrant of right female breast: Secondary | ICD-10-CM | POA: Diagnosis not present

## 2021-12-12 ENCOUNTER — Ambulatory Visit
Admission: RE | Admit: 2021-12-12 | Discharge: 2021-12-12 | Disposition: A | Discharge status: Home / Self Care | Payer: Federal, State, Local not specified - PPO | Source: Ambulatory Visit | Attending: Radiation Oncology | Admitting: Radiation Oncology

## 2021-12-12 ENCOUNTER — Other Ambulatory Visit: Payer: Self-pay

## 2021-12-12 DIAGNOSIS — C50211 Malignant neoplasm of upper-inner quadrant of right female breast: Secondary | ICD-10-CM | POA: Diagnosis not present

## 2021-12-13 ENCOUNTER — Ambulatory Visit: Payer: Federal, State, Local not specified - PPO | Admitting: Rehabilitation

## 2021-12-13 ENCOUNTER — Encounter: Payer: Self-pay | Admitting: Rehabilitation

## 2021-12-13 ENCOUNTER — Ambulatory Visit
Admission: RE | Admit: 2021-12-13 | Discharge: 2021-12-13 | Disposition: A | Discharge status: Home / Self Care | Payer: Federal, State, Local not specified - PPO | Source: Ambulatory Visit | Attending: Radiation Oncology | Admitting: Radiation Oncology

## 2021-12-13 DIAGNOSIS — R293 Abnormal posture: Secondary | ICD-10-CM

## 2021-12-13 DIAGNOSIS — M25612 Stiffness of left shoulder, not elsewhere classified: Secondary | ICD-10-CM

## 2021-12-13 DIAGNOSIS — C50211 Malignant neoplasm of upper-inner quadrant of right female breast: Secondary | ICD-10-CM

## 2021-12-13 DIAGNOSIS — Z483 Aftercare following surgery for neoplasm: Secondary | ICD-10-CM

## 2021-12-13 DIAGNOSIS — M25611 Stiffness of right shoulder, not elsewhere classified: Secondary | ICD-10-CM

## 2021-12-13 NOTE — Therapy (Signed)
[facility] @ [place], [place], [address] Phone: [phone]   Fax:  [phone]  Physical Therapy Treatment  Patient Details  Name: Chelsea Wise MRN: [id number] Date of Birth: [birth year] Referring Provider (PT): Dr. Rolm Bookbinder   Encounter Date: 12/13/2021   PT End of Session - 12/13/21 1150     Visit Number 9    Number of Visits 10    Date for PT Re-Evaluation 12/08/21    PT Start Time 1103    PT Stop Time 1150    PT Time Calculation (min) 47 min    Activity Tolerance Patient tolerated treatment well    Behavior During Therapy [facility] for tasks assessed/performed             Past Medical History:  Diagnosis Date   Cancer ([place])    Breast   Family history of breast cancer    Family history of lung cancer    Family history of pancreatic cancer    Family history of prostate cancer    Medical history non-contributory    Placenta previa    SVD (spontaneous vaginal delivery) 01/18/2014    Past Surgical History:  Procedure Laterality Date   MASTECTOMY W/ SENTINEL NODE BIOPSY Right 10/21/2021   Procedure: RIGHT MASTECTOMY WITH RIGHT AXILLARY SENTINEL LYMPH NODE BIOPSY;  Surgeon: Rolm Bookbinder, MD;  Location: [facility];  Service: General;  Laterality: Right;   NO PAST SURGERIES     PORT-A-CATH REMOVAL Left 10/21/2021   Procedure: REMOVAL PORT-A-CATH;  Surgeon: Rolm Bookbinder, MD;  Location: [facility];  Service: General;  Laterality: Left;   PORTACATH PLACEMENT Left 05/09/2021   Procedure: INSERTION PORT-A-CATH;  Surgeon: Rolm Bookbinder, MD;  Location: [facility];  Service: General;  Laterality: Left;   RADIOACTIVE SEED GUIDED AXILLARY SENTINEL LYMPH NODE Right 10/21/2021   Procedure: RADIOACTIVE SEED GUIDED RIGHT AXILLARY LYMPH NODE EXCISION;  Surgeon: Rolm Bookbinder, MD;  Location: [facility];  Service: General;  Laterality: Right;    TOTAL MASTECTOMY Left 10/21/2021   Procedure: LEFT TOTAL MASTECTOMY;  Surgeon: Rolm Bookbinder, MD;  Location: [facility];  Service: General;  Laterality: Left;   WISDOM TOOTH EXTRACTION      There were no vitals filed for this visit.   Subjective Assessment - 12/13/21 1104     Subjective I think the cord is worse again    Pertinent History Patient was diagnosed on 04/11/2021 with right grade II invasive ductal carcinoma breast cancer. It measures 4.5 cm and is located in the upper inner quadrant. It is ER positive, weakly PR positive, and HER2 equivocal. Ki67 is 25%. She has 2 abnormal appearing axillary lymph nodes. One was biopsied and found to be positive.    Currently in Pain? No/denies                               [facility] Adult PT Treatment/Exercise - 12/13/21 0001       Manual Therapy   Soft tissue mobilization held due radiation post treatment    Myofascial Release Right axillary/upper arm area of cording    Passive ROM In Supine: PROM of Rt shouder into flexion, D2 flexion, abd                          PT Long Term Goals - 11/10/21 1100  PT LONG TERM GOAL #1   Title Patient will demonstrate she has regained full shoulder ROM and function post op compared to baselines.    Time 4    Period Weeks    Status On-going    Target Date 12/08/21      PT LONG TERM GOAL #2   Title Patient will improve her DASH score to be back to zero for improved overall upper extremity function.    Baseline zero pre-op and 61.36 post op    Time 4    Period Weeks    Status New    Target Date 12/08/21      PT LONG TERM GOAL #3   Title Patient will increase bilateral shoulder flexion to >/= 150 degrees for increased ease reaching overhead.    Baseline Right 111 post op    Time 4    Period Weeks    Status New    Target Date 12/08/21      PT LONG TERM GOAL #4   Title Patient will increase bilateral shoulder abduction to >/= 160  degrees for improved ability to obtain radiation positioning.    Baseline 116 degrees right side post op    Time 4    Period Weeks    Status New    Target Date 12/08/21      PT LONG TERM GOAL #5   Title Patient will be independent in her HEP for impvoed shoulder function.    Time 4    Period Weeks    Status New    Target Date 12/08/21                   Plan - 12/13/21 1404     Clinical Impression Statement Cording was more prominent today and a bit more puffy due to radiation.  Pt will get sleeve on Saturday.  Skin is starting to get red and noted a few splotchy places where pt reports she thinks the radioplex is irritating her skin.  Will do one more visit on Thursday and maybe break for radiation healing.    PT Frequency 2x / week    PT Duration 4 weeks    PT Treatment/Interventions ADLs/Self Care Home Management;Therapeutic exercise;Patient/family education;Passive range of motion;Manual techniques;Manual lymph drainage    Consulted and Agree with Plan of Care Patient             Patient will benefit from skilled therapeutic intervention in order to improve the following deficits and impairments:     Visit Diagnosis: Malignant neoplasm of upper-inner quadrant of right breast in female, estrogen receptor positive ([place])  Aftercare following surgery for neoplasm  Stiffness of left shoulder, not elsewhere classified  Abnormal posture  Stiffness of right shoulder, not elsewhere classified     Problem List Patient Active Problem List   Diagnosis Date Noted   S/P bilateral mastectomy 10/21/2021   Port-A-Cath in place 06/07/2021   Genetic testing 04/19/2021   Malignant neoplasm of upper-inner quadrant of right breast in female, estrogen receptor positive ([place]) 04/13/2021   Family history of prostate cancer    Family history of pancreatic cancer    Family history of breast cancer    Family history of lung cancer    Active labor 03/15/2016   Postpartum care  following vaginal delivery 03/15/2016   SVD (spontaneous vaginal delivery) 01/18/2014   Labor and delivery, indication for care 01/17/2014   Antepartum bleeding, third trimester 01/01/2014   Placenta previa in second trimester 11/28/2013  Stark Bray, PT 12/13/2021, 2:06 PM  [facility] @ [place], [place], [address] Phone: [phone]   Fax:  [phone]  Name: Chelsea Wise MRN: [id number] Date of Birth: Jan 04, 1981

## 2021-12-14 ENCOUNTER — Other Ambulatory Visit: Payer: Self-pay

## 2021-12-14 ENCOUNTER — Ambulatory Visit
Admission: RE | Admit: 2021-12-14 | Discharge: 2021-12-14 | Disposition: A | Discharge status: Home / Self Care | Payer: Federal, State, Local not specified - PPO | Source: Ambulatory Visit | Attending: Radiation Oncology | Admitting: Radiation Oncology

## 2021-12-14 DIAGNOSIS — C50211 Malignant neoplasm of upper-inner quadrant of right female breast: Secondary | ICD-10-CM | POA: Diagnosis not present

## 2021-12-15 ENCOUNTER — Ambulatory Visit
Admission: RE | Admit: 2021-12-15 | Discharge: 2021-12-15 | Disposition: A | Discharge status: Home / Self Care | Payer: Federal, State, Local not specified - PPO | Source: Ambulatory Visit | Attending: Radiation Oncology | Admitting: Radiation Oncology

## 2021-12-15 ENCOUNTER — Ambulatory Visit: Payer: Federal, State, Local not specified - PPO | Admitting: Rehabilitation

## 2021-12-15 ENCOUNTER — Encounter: Payer: Self-pay | Admitting: Rehabilitation

## 2021-12-15 DIAGNOSIS — R293 Abnormal posture: Secondary | ICD-10-CM

## 2021-12-15 DIAGNOSIS — C50211 Malignant neoplasm of upper-inner quadrant of right female breast: Secondary | ICD-10-CM | POA: Diagnosis not present

## 2021-12-15 DIAGNOSIS — M25612 Stiffness of left shoulder, not elsewhere classified: Secondary | ICD-10-CM

## 2021-12-15 DIAGNOSIS — M25611 Stiffness of right shoulder, not elsewhere classified: Secondary | ICD-10-CM

## 2021-12-15 DIAGNOSIS — Z483 Aftercare following surgery for neoplasm: Secondary | ICD-10-CM

## 2021-12-15 DIAGNOSIS — Z170 Estrogen receptor positive status [ER+]: Secondary | ICD-10-CM

## 2021-12-15 NOTE — Therapy (Signed)
[facility] @ [facility] [place], [place], [address] Phone: [phone]   Fax:  [phone]  Physical Therapy Treatment  Patient Details  Name: Chelsea Wise MRN: [id number] Date of Birth: Aug 02, 1981 Referring Provider (PT): Dr. Rolm Bookbinder   Encounter Date: 12/15/2021   PT End of Session - 12/15/21 1144     Visit Number 10    Number of Visits 10    PT Start Time 1110    PT Stop Time 1093    PT Time Calculation (min) 34 min    Activity Tolerance Patient tolerated treatment well    Behavior During Therapy [facility] for tasks assessed/performed             Past Medical History:  Diagnosis Date   Cancer ([place])    Breast   Family history of breast cancer    Family history of lung cancer    Family history of pancreatic cancer    Family history of prostate cancer    Medical history non-contributory    Placenta previa    SVD (spontaneous vaginal delivery) 01/18/2014    Past Surgical History:  Procedure Laterality Date   MASTECTOMY W/ SENTINEL NODE BIOPSY Right 10/21/2021   Procedure: RIGHT MASTECTOMY WITH RIGHT AXILLARY SENTINEL LYMPH NODE BIOPSY;  Surgeon: Rolm Bookbinder, MD;  Location: [facility];  Service: General;  Laterality: Right;   NO PAST SURGERIES     PORT-A-CATH REMOVAL Left 10/21/2021   Procedure: REMOVAL PORT-A-CATH;  Surgeon: Rolm Bookbinder, MD;  Location: [facility];  Service: General;  Laterality: Left;   PORTACATH PLACEMENT Left 05/09/2021   Procedure: INSERTION PORT-A-CATH;  Surgeon: Rolm Bookbinder, MD;  Location: [facility];  Service: General;  Laterality: Left;   RADIOACTIVE SEED GUIDED AXILLARY SENTINEL LYMPH NODE Right 10/21/2021   Procedure: RADIOACTIVE SEED GUIDED RIGHT AXILLARY LYMPH NODE EXCISION;  Surgeon: Rolm Bookbinder, MD;  Location: [facility];  Service: General;  Laterality: Right;   TOTAL MASTECTOMY Left 10/21/2021    Procedure: LEFT TOTAL MASTECTOMY;  Surgeon: Rolm Bookbinder, MD;  Location: [facility];  Service: General;  Laterality: Left;   WISDOM TOOTH EXTRACTION      There were no vitals filed for this visit.   Subjective Assessment - 12/15/21 1112     Subjective Nothing new    Pertinent History Patient was diagnosed on 04/11/2021 with right grade II invasive ductal carcinoma breast cancer. It measures 4.5 cm and is located in the upper inner quadrant. It is ER positive, weakly PR positive, and HER2 equivocal. Ki67 is 25%. She has 2 abnormal appearing axillary lymph nodes. One was biopsied and found to be positive.    Currently in Pain? No/denies                       Katina Dung - 12/15/21 0001     Open a tight or new jar No difficulty    Do heavy household chores (wash walls, wash floors) Mild difficulty    Carry a shopping bag or briefcase No difficulty    Wash your back No difficulty    Use a knife to cut food No difficulty    Recreational activities in which you take some force or impact through your arm, shoulder, or hand (golf, hammering, tennis) Mild difficulty    During the past week, to what extent has your arm, shoulder or hand problem interfered with your normal social activities with  family, friends, neighbors, or groups? Not at all    During the past week, to what extent has your arm, shoulder or hand problem limited your work or other regular daily activities Not at all    Arm, shoulder, or hand pain. None    Tingling (pins and needles) in your arm, shoulder, or hand None    Difficulty Sleeping No difficulty    DASH Score 4.55 %                    [facility] Adult PT Treatment/Exercise - 12/15/21 0001       Shoulder Exercises: Pulleys   Flexion 2 minutes    ABduction 2 minutes      Shoulder Exercises: Therapy Ball   Flexion Both;10 reps      Manual Therapy   Soft tissue mobilization held due radiation post treatment    Myofascial  Release Right axillary/upper arm area of cording    Passive ROM In Supine: PROM of Rt shouder into flexion, D2 flexion, abd                          PT Long Term Goals - 12/15/21 1146       PT LONG TERM GOAL #1   Title Patient will demonstrate she has regained full shoulder ROM and function post op compared to baselines.    Status Achieved      PT LONG TERM GOAL #2   Title Patient will improve her DASH score to be back to zero for improved overall upper extremity function.    Status Partially Met      PT LONG TERM GOAL #3   Title Patient will increase bilateral shoulder flexion to >/= 150 degrees for increased ease reaching overhead.    Status Achieved      PT LONG TERM GOAL #4   Title Patient will increase bilateral shoulder abduction to >/= 160 degrees for improved ability to obtain radiation positioning.    Status Achieved      PT LONG TERM GOAL #5   Title Patient will be independent in her HEP for impvoed shoulder function.    Status Achieved                   Plan - 12/15/21 1144     Clinical Impression Statement Pt is doing well overall.  Cording is still present in the axilla and upper arm but pt can perform pulleys without pull and tolerates PROM without limitation.  Had shortened session due to pt doing well and starting to complete radiation.    PT Frequency 2x / week    PT Duration 4 weeks    PT Treatment/Interventions ADLs/Self Care Home Management;Therapeutic exercise;Patient/family education;Passive range of motion;Manual techniques;Manual lymph drainage    PT Next Visit Plan [email or web address]    Consulted and Agree with Plan of Care Patient             Patient will benefit from skilled therapeutic intervention in order to improve the following deficits and impairments:  Postural dysfunction, Decreased range of motion, Decreased knowledge of precautions, Impaired UE functional use, Pain, Increased edema, Decreased scar mobility  Visit  Diagnosis: Malignant neoplasm of upper-inner quadrant of right breast in female, estrogen receptor positive ([place]  Aftercare following surgery for neoplasm  Stiffness of left shoulder, not elsewhere classified  Abnormal posture  Stiffness of right shoulder, not elsewhere classified     Problem List Patient  Active Problem List   Diagnosis Date Noted   S/P bilateral mastectomy 10/21/2021   Port-A-Cath in place 06/07/2021   Genetic testing 04/19/2021   Malignant neoplasm of upper-inner quadrant of right breast in female, estrogen receptor positive ([place]) 04/13/2021   Family history of prostate cancer    Family history of pancreatic cancer    Family history of breast cancer    Family history of lung cancer    Active labor 03/15/2016   Postpartum care following vaginal delivery 03/15/2016   SVD (spontaneous vaginal delivery) 01/18/2014   Labor and delivery, indication for care 01/17/2014   Antepartum bleeding, third trimester 01/01/2014   Placenta previa in second trimester 11/28/2013    Stark Bray, PT 12/15/2021, 11:50 AM  [facility] @ [facility] [place], [place], [address] Phone: [phone]   Fax:  [phone]  Name: Dhruti Ghuman MRN: [id number] Date of Birth: July 20, 1981

## 2021-12-16 ENCOUNTER — Ambulatory Visit
Admission: RE | Admit: 2021-12-16 | Discharge: 2021-12-16 | Disposition: A | Discharge status: Home / Self Care | Payer: Federal, State, Local not specified - PPO | Source: Ambulatory Visit | Attending: Radiation Oncology | Admitting: Radiation Oncology

## 2021-12-16 ENCOUNTER — Other Ambulatory Visit: Payer: Self-pay

## 2021-12-16 DIAGNOSIS — C50211 Malignant neoplasm of upper-inner quadrant of right female breast: Secondary | ICD-10-CM | POA: Diagnosis not present

## 2021-12-19 ENCOUNTER — Ambulatory Visit
Admission: RE | Admit: 2021-12-19 | Discharge: 2021-12-19 | Disposition: A | Discharge status: Home / Self Care | Payer: Federal, State, Local not specified - PPO | Source: Ambulatory Visit | Attending: Radiation Oncology | Admitting: Radiation Oncology

## 2021-12-19 ENCOUNTER — Other Ambulatory Visit: Payer: Self-pay

## 2021-12-19 DIAGNOSIS — C50211 Malignant neoplasm of upper-inner quadrant of right female breast: Secondary | ICD-10-CM | POA: Diagnosis not present

## 2021-12-20 ENCOUNTER — Ambulatory Visit
Admission: RE | Admit: 2021-12-20 | Discharge: 2021-12-20 | Disposition: A | Discharge status: Home / Self Care | Payer: Federal, State, Local not specified - PPO | Source: Ambulatory Visit | Attending: Radiation Oncology | Admitting: Radiation Oncology

## 2021-12-20 ENCOUNTER — Other Ambulatory Visit: Payer: Self-pay

## 2021-12-20 ENCOUNTER — Encounter: Payer: Federal, State, Local not specified - PPO | Admitting: Rehabilitation

## 2021-12-20 DIAGNOSIS — C50211 Malignant neoplasm of upper-inner quadrant of right female breast: Secondary | ICD-10-CM | POA: Diagnosis not present

## 2021-12-21 ENCOUNTER — Ambulatory Visit
Admission: RE | Admit: 2021-12-21 | Discharge: 2021-12-21 | Disposition: A | Discharge status: Home / Self Care | Payer: Federal, State, Local not specified - PPO | Source: Ambulatory Visit | Attending: Radiation Oncology | Admitting: Radiation Oncology

## 2021-12-21 DIAGNOSIS — C50211 Malignant neoplasm of upper-inner quadrant of right female breast: Secondary | ICD-10-CM | POA: Diagnosis not present

## 2021-12-22 ENCOUNTER — Other Ambulatory Visit: Payer: Self-pay

## 2021-12-22 ENCOUNTER — Encounter: Payer: Federal, State, Local not specified - PPO | Admitting: Rehabilitation

## 2021-12-22 ENCOUNTER — Ambulatory Visit
Admission: RE | Admit: 2021-12-22 | Discharge: 2021-12-22 | Disposition: A | Discharge status: Home / Self Care | Payer: Federal, State, Local not specified - PPO | Source: Ambulatory Visit | Attending: Radiation Oncology | Admitting: Radiation Oncology

## 2021-12-22 DIAGNOSIS — C50211 Malignant neoplasm of upper-inner quadrant of right female breast: Secondary | ICD-10-CM | POA: Diagnosis not present

## 2021-12-23 ENCOUNTER — Ambulatory Visit
Admission: RE | Admit: 2021-12-23 | Discharge: 2021-12-23 | Disposition: A | Discharge status: Home / Self Care | Payer: Federal, State, Local not specified - PPO | Source: Ambulatory Visit | Attending: Radiation Oncology | Admitting: Radiation Oncology

## 2021-12-23 DIAGNOSIS — C50211 Malignant neoplasm of upper-inner quadrant of right female breast: Secondary | ICD-10-CM | POA: Diagnosis not present

## 2021-12-26 ENCOUNTER — Ambulatory Visit
Admission: RE | Admit: 2021-12-26 | Discharge: 2021-12-26 | Disposition: A | Discharge status: Home / Self Care | Payer: Federal, State, Local not specified - PPO | Source: Ambulatory Visit | Attending: Radiation Oncology | Admitting: Radiation Oncology

## 2021-12-26 ENCOUNTER — Other Ambulatory Visit: Payer: Self-pay

## 2021-12-26 DIAGNOSIS — Z170 Estrogen receptor positive status [ER+]: Secondary | ICD-10-CM

## 2021-12-26 DIAGNOSIS — C50211 Malignant neoplasm of upper-inner quadrant of right female breast: Secondary | ICD-10-CM | POA: Diagnosis not present

## 2021-12-26 MED ORDER — RADIAPLEXRX EX GEL: Freq: Once | CUTANEOUS | Status: AC

## 2021-12-27 ENCOUNTER — Other Ambulatory Visit: Payer: Self-pay

## 2021-12-27 ENCOUNTER — Encounter: Payer: Federal, State, Local not specified - PPO | Admitting: Rehabilitation

## 2021-12-27 ENCOUNTER — Ambulatory Visit
Admission: RE | Admit: 2021-12-27 | Discharge: 2021-12-27 | Disposition: A | Discharge status: Home / Self Care | Payer: Federal, State, Local not specified - PPO | Source: Ambulatory Visit | Attending: Radiation Oncology | Admitting: Radiation Oncology

## 2021-12-27 DIAGNOSIS — C50211 Malignant neoplasm of upper-inner quadrant of right female breast: Secondary | ICD-10-CM | POA: Diagnosis not present

## 2021-12-28 ENCOUNTER — Ambulatory Visit
Admission: RE | Admit: 2021-12-28 | Discharge: 2021-12-28 | Disposition: A | Discharge status: Home / Self Care | Payer: Federal, State, Local not specified - PPO | Source: Ambulatory Visit | Attending: Radiation Oncology | Admitting: Radiation Oncology

## 2021-12-28 DIAGNOSIS — C50211 Malignant neoplasm of upper-inner quadrant of right female breast: Secondary | ICD-10-CM | POA: Diagnosis not present

## 2021-12-29 ENCOUNTER — Other Ambulatory Visit: Payer: Self-pay

## 2021-12-29 ENCOUNTER — Encounter: Payer: Federal, State, Local not specified - PPO | Admitting: Rehabilitation

## 2021-12-29 ENCOUNTER — Ambulatory Visit
Admission: RE | Admit: 2021-12-29 | Discharge: 2021-12-29 | Disposition: A | Discharge status: Home / Self Care | Payer: Federal, State, Local not specified - PPO | Source: Ambulatory Visit | Attending: Radiation Oncology | Admitting: Radiation Oncology

## 2021-12-29 DIAGNOSIS — C50211 Malignant neoplasm of upper-inner quadrant of right female breast: Secondary | ICD-10-CM | POA: Diagnosis not present

## 2021-12-30 ENCOUNTER — Ambulatory Visit
Admission: RE | Admit: 2021-12-30 | Discharge: 2021-12-30 | Disposition: A | Discharge status: Home / Self Care | Payer: Federal, State, Local not specified - PPO | Source: Ambulatory Visit | Attending: Radiation Oncology | Admitting: Radiation Oncology

## 2021-12-30 DIAGNOSIS — C50211 Malignant neoplasm of upper-inner quadrant of right female breast: Secondary | ICD-10-CM | POA: Diagnosis not present

## 2021-12-31 NOTE — Progress Notes (Signed)
Patient Care Team: Patient, [name] (Inactive) as PCP - General (General Practice) [name], RN as Oncology Nurse Navigator [name], RN as Oncology Nurse Navigator  DIAGNOSIS:    ICD-10-CM   1. Malignant neoplasm of upper-inner quadrant of right breast in female, estrogen receptor positive ([place])  C50.211    Z17.0       SUMMARY OF ONCOLOGIC HISTORY: Oncology History  Malignant neoplasm of upper-inner quadrant of right breast in female, estrogen receptor positive ([place])  04/08/2021 Initial Diagnosis   Palpable right breast mass: Mammogram and US showed a 4.5cm right breast mass at the 12-1 o'clock position with associated microcalcifications, multiple additional masses from the 9-11 o'clock position in the right breast, calcifications in the subareolar right breast, 1.4cm, and two enlarged right axillary lymph nodes. Biopsy showed invasive and in situ ductal carcinoma in the breast and axilla, grade 2 ER 80%, PR 1%, Ki-67 25%, HER2 equivalent by [facility]   04/08/2021 Cancer Staging   Staging form: Breast, AJCC 8th Edition - Clinical stage from 04/08/2021: Stage IIA (cT2, cN1, cM0, G2, ER+, PR+, HER2-) - Signed by Nicholas Lose, MD on 04/13/2021 Stage prefix: Initial diagnosis Histologic grading system: 3 grade system    04/19/2021 Genetic Testing   Negative genetic testing:  No pathogenic variants detected on the [employer] BRCAplus or CancerNext-Expanded + RNAinsight panels. Two variants of uncertain significance (VUS) were detected - one in the APC gene called p.T2422I (c.7265C>T) and a second in the BAP1 gene called p.P302L (c.905C>T). The report dates are 04/19/2021 and 04/25/2021, respectively.  The BRCAplus panel offered by [employer] and includes sequencing and deletion/duplication analysis for the following 8 genes: ATM, BRCA1, BRCA2, CDH1, CHEK2, PALB2, PTEN, and TP53. The CancerNext-Expanded + RNAinsight gene panel offered by [employer] and includes sequencing and  rearrangement analysis for the following 77 genes: AIP, ALK, APC, ATM, AXIN2, BAP1, BARD1, BLM, BMPR1A, BRCA1, BRCA2, BRIP1, CDC73, CDH1, CDK4, CDKN1B, CDKN2A, CHEK2, CTNNA1, DICER1, FANCC, FH, FLCN, GALNT12, KIF1B, LZTR1, MAX, MEN1, MET, MLH1, MSH2, MSH3, MSH6, MUTYH, NBN, NF1, NF2, NTHL1, PALB2, PHOX2B, PMS2, POT1, PRKAR1A, PTCH1, PTEN, RAD51C, RAD51D, RB1, RECQL, RET, SDHA, SDHAF2, SDHB, SDHC, SDHD, SMAD4, SMARCA4, SMARCB1, SMARCE1, STK11, SUFU, TMEM127, TP53, TSC1, TSC2, VHL and XRCC2 (sequencing and deletion/duplication); EGFR, EGLN1, HOXB13, KIT, MITF, PDGFRA, POLD1 and POLE (sequencing only); EPCAM and GREM1 (deletion/duplication only). RNA data is routinely analyzed for use in variant interpretation for all genes.    05/10/2021 - 09/20/2021 Chemotherapy   Patient is on Treatment Plan : BREAST ADJUVANT DOSE DENSE AC q14d / PACLitaxel q7d     11/30/2021 - 01/11/2022 Radiation Therapy   Adjuvant radiation     CHIEF COMPLIANT: Follow-up of right breast cancer  INTERVAL HISTORY: Chelsea Wise is a 41 y.o. with above-mentioned history of right breast cancer having completed neoadjuvant chemotherapy with dose dense Adriamycin and Cytoxan, and chemotherapy with Taxol. She reports to the clinic today for follow-up.  She has recovered from chemotherapy side effects.  She has radiation dermatitis.  ALLERGIES:  has No Known Allergies.  MEDICATIONS:  Current Outpatient Medications  Medication Sig Dispense Refill   acetaminophen (TYLENOL) 500 MG tablet Take 500-1,000 mg by mouth every 6 (six) hours as needed for moderate pain.     Ascorbic Acid (VITAMIN C PO) Take 1 tablet by mouth daily.     cetirizine (ZYRTEC) 10 MG tablet Take 10 mg by mouth daily as needed for allergies.     ELDERBERRY PO Take  1 capsule by mouth daily.     hydrocortisone cream 1 % Apply 1 application topically 2 (two) times daily as needed for itching.     methocarbamol (ROBAXIN) 500 MG tablet Take 1 tablet (500 mg total) by  mouth 4 (four) times daily. 30 tablet 1   Multiple Vitamin (MULTIVITAMIN WITH MINERALS) TABS tablet Take 2 tablets by mouth daily.     Multiple Vitamins-Minerals (ZINC PO) Take 1 tablet by mouth once a week.     nystatin (MYCOSTATIN) 100000 UNIT/ML suspension TAKE 5 MLS (500,000 UNITS TOTAL) BY MOUTH IN THE MORNING AND AT BEDTIME. 60 mL 0   oxyCODONE (OXY IR/ROXICODONE) 5 MG immediate release tablet Take 1 tablet (5 mg total) by mouth every 6 (six) hours as needed. 10 tablet 0   sodium chloride (OCEAN) 0.65 % SOLN nasal spray Place 1 spray into both nostrils as needed for congestion.     No current facility-administered medications for this visit.    PHYSICAL EXAMINATION: ECOG PERFORMANCE STATUS: 1 - Symptomatic but completely ambulatory  There were no vitals filed for this visit. There were no vitals filed for this visit.  BREAST: No palpable masses or nodules in either right or left breasts. No palpable axillary supraclavicular or infraclavicular adenopathy no breast tenderness or nipple discharge. (exam performed in the presence of a chaperone)  LABORATORY DATA:  I have reviewed the data as listed CMP Latest Ref Rng & Units 09/20/2021 09/13/2021 09/06/2021  Glucose 70 - 99 mg/dL 93 83 85  BUN 6 - 20 mg/dL $Remove'8 10 7  'uOwgxtp$ Creatinine 0.44 - 1.00 mg/dL 0.70 0.70 0.67  Sodium 135 - 145 mmol/L 141 138 140  Potassium 3.5 - 5.1 mmol/L 3.8 3.7 3.8  Chloride 98 - 111 mmol/L 107 104 106  CO2 22 - 32 mmol/L $RemoveB'24 24 25  'tJxwuxiR$ Calcium 8.9 - 10.3 mg/dL 9.5 9.3 9.5  Total Protein 6.5 - 8.1 g/dL 7.2 7.1 7.1  Total Bilirubin 0.3 - 1.2 mg/dL 0.3 0.3 0.3  Alkaline Phos 38 - 126 U/L 48 50 50  AST 15 - 41 U/L $Remo'19 17 15  'vYpPL$ ALT 0 - 44 U/L $Remo'13 17 14    'guZQQ$ Lab Results  Component Value Date   WBC 5.2 09/20/2021   HGB 11.2 (L) 09/20/2021   HCT 33.3 (L) 09/20/2021   MCV 95.1 09/20/2021   PLT 301 09/20/2021   NEUTROABS 3.5 09/20/2021    ASSESSMENT & PLAN:  Malignant neoplasm of upper-inner quadrant of right breast in  female, estrogen receptor positive ([place]) 04/08/2021:Palpable right breast mass: Mammogram and US showed a 4.5cm right breast mass at the 12-1 o'clock position with associated microcalcifications, multiple additional masses from the 9-11 o'clock position in the right breast, calcifications in the subareolar right breast, 1.4cm, and two enlarged right axillary lymph nodes. 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  Neoadjuvant chemotherapy with dose dense Adriamycin and Cytoxan x4 followed by Taxol weekly x12 completed 09/14/21 2. Bilateral mastectomies with targeted node dissection 10/21/21 Left: Benign Right: 6 cm residual IDC 2/7 LN Positive ER: 80%, PR 1%, Her 2 Neg 3.  Adjuvant radiation 12/01/2021-01/11/2022 4.  Followed by adjuvant antiestrogen therapy tamoxifen versus ovarian function suppression with AI -------------------------------------------------------------------------------------------------------------------- She has all  the information but has not made a decision regarding antiestrogen therapy. She will call us and let us know of her decision. I instructed her that we need to start her on antiestrogen therapy by 02/01/2022.  There is no indication for abemaciclib because the tumor was HER2 positive and also the grade was a grade 2 and only 2 lymph nodes were involved.  Return to clinic in 3 months for survivorship care plan visit      No orders of the defined types were placed in this encounter.  The patient has a good understanding of the overall plan. she agrees with it. she will call with any problems that may develop before the next visit here.  Total time spent: 30 mins including face to face time and time  spent for planning, charting and coordination of care  Rulon Eisenmenger, MD, MPH 01/02/2022  I, Thana Ates, am acting as scribe for Dr. Nicholas Lose.  I have reviewed the above documentation for accuracy and completeness, and I agree with the above.

## 2022-01-02 ENCOUNTER — Other Ambulatory Visit: Payer: Self-pay

## 2022-01-02 ENCOUNTER — Inpatient Hospital Stay: Payer: Federal, State, Local not specified - PPO | Admitting: Hematology and Oncology

## 2022-01-02 ENCOUNTER — Ambulatory Visit
Admission: RE | Admit: 2022-01-02 | Discharge: 2022-01-02 | Disposition: A | Discharge status: Home / Self Care | Payer: Federal, State, Local not specified - PPO | Source: Ambulatory Visit | Attending: Radiation Oncology | Admitting: Radiation Oncology

## 2022-01-02 DIAGNOSIS — Z9221 Personal history of antineoplastic chemotherapy: Secondary | ICD-10-CM | POA: Insufficient documentation

## 2022-01-02 DIAGNOSIS — Z170 Estrogen receptor positive status [ER+]: Secondary | ICD-10-CM | POA: Diagnosis not present

## 2022-01-02 DIAGNOSIS — C50211 Malignant neoplasm of upper-inner quadrant of right female breast: Secondary | ICD-10-CM

## 2022-01-02 DIAGNOSIS — C773 Secondary and unspecified malignant neoplasm of axilla and upper limb lymph nodes: Secondary | ICD-10-CM | POA: Insufficient documentation

## 2022-01-02 MED ORDER — RADIAPLEXRX EX GEL: Freq: Once | CUTANEOUS | Status: AC

## 2022-01-02 NOTE — Assessment & Plan Note (Signed)
04/08/2021:Palpable right breast mass: Mammogram and US showed a 4.5cm right breast mass at the 12-1 o'clock position with associated microcalcifications, multiple additional masses from the 9-11 o'clock position in the right breast, calcifications in the subareolar right breast, 1.4cm, and two enlarged right axillary lymph nodes. 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 Neoadjuvant chemotherapy with dose dense Adriamycin and Cytoxan x4 followed by Taxol weekly x12 completed 09/14/21 2. Bilateral mastectomies with targeted node dissection 10/21/21 Left: Benign Right: 6 cm residual IDC 2/7 LN Positive ER: 80%, PR 1%, Her 2 Neg 3. Adjuvant radiation 12/01/2021-01/11/2022 4. Followed by adjuvant antiestrogen therapy -------------------------------------------------------------------------------------------------------------------- Tamoxifen counseling: We discussed the risks and benefits of tamoxifen. These include but not limited to insomnia, hot flashes, mood changes, vaginal dryness, and weight gain. Although rare, serious side effects including endometrial cancer, risk of blood clots were also discussed. We strongly believe that the benefits far outweigh the risks. Patient understands these risks and consented to starting treatment. Planned treatment duration is 10 years.  There is no indication for abemaciclib because the tumor was HER2 positive and also the grade was a grade 2 and only 2 lymph nodes were involved.  Return to clinic in 3 months for survivorship care plan visit

## 2022-01-03 ENCOUNTER — Ambulatory Visit
Admission: RE | Admit: 2022-01-03 | Discharge: 2022-01-03 | Disposition: A | Discharge status: Home / Self Care | Payer: Federal, State, Local not specified - PPO | Source: Ambulatory Visit | Attending: Radiation Oncology | Admitting: Radiation Oncology

## 2022-01-03 DIAGNOSIS — C50211 Malignant neoplasm of upper-inner quadrant of right female breast: Secondary | ICD-10-CM | POA: Diagnosis not present

## 2022-01-04 ENCOUNTER — Ambulatory Visit
Admission: RE | Admit: 2022-01-04 | Discharge: 2022-01-04 | Disposition: A | Discharge status: Home / Self Care | Payer: Federal, State, Local not specified - PPO | Source: Ambulatory Visit | Attending: Radiation Oncology | Admitting: Radiation Oncology

## 2022-01-04 ENCOUNTER — Other Ambulatory Visit: Payer: Self-pay

## 2022-01-04 DIAGNOSIS — C50211 Malignant neoplasm of upper-inner quadrant of right female breast: Secondary | ICD-10-CM | POA: Insufficient documentation

## 2022-01-04 DIAGNOSIS — Z170 Estrogen receptor positive status [ER+]: Secondary | ICD-10-CM | POA: Diagnosis present

## 2022-01-05 ENCOUNTER — Other Ambulatory Visit: Payer: Self-pay

## 2022-01-05 ENCOUNTER — Ambulatory Visit
Admission: RE | Admit: 2022-01-05 | Discharge: 2022-01-05 | Disposition: A | Discharge status: Home / Self Care | Payer: Federal, State, Local not specified - PPO | Source: Ambulatory Visit | Attending: Radiation Oncology | Admitting: Radiation Oncology

## 2022-01-05 DIAGNOSIS — C50211 Malignant neoplasm of upper-inner quadrant of right female breast: Secondary | ICD-10-CM | POA: Diagnosis not present

## 2022-01-06 ENCOUNTER — Other Ambulatory Visit: Payer: Self-pay

## 2022-01-06 ENCOUNTER — Ambulatory Visit
Admission: RE | Admit: 2022-01-06 | Discharge: 2022-01-06 | Disposition: A | Discharge status: Home / Self Care | Payer: Federal, State, Local not specified - PPO | Source: Ambulatory Visit | Attending: Radiation Oncology | Admitting: Radiation Oncology

## 2022-01-06 DIAGNOSIS — C50211 Malignant neoplasm of upper-inner quadrant of right female breast: Secondary | ICD-10-CM | POA: Diagnosis not present

## 2022-01-09 ENCOUNTER — Ambulatory Visit
Admission: RE | Admit: 2022-01-09 | Discharge: 2022-01-09 | Disposition: A | Discharge status: Home / Self Care | Payer: Federal, State, Local not specified - PPO | Source: Ambulatory Visit | Attending: Radiation Oncology | Admitting: Radiation Oncology

## 2022-01-09 ENCOUNTER — Other Ambulatory Visit: Payer: Self-pay

## 2022-01-09 DIAGNOSIS — C50211 Malignant neoplasm of upper-inner quadrant of right female breast: Secondary | ICD-10-CM | POA: Diagnosis not present

## 2022-01-09 DIAGNOSIS — Z170 Estrogen receptor positive status [ER+]: Secondary | ICD-10-CM

## 2022-01-09 MED ORDER — SONAFINE EX EMUL
1.0000 "application " | Freq: Once | CUTANEOUS | Status: AC
  Administered 2022-01-09: 1 via TOPICAL

## 2022-01-09 MED ORDER — RADIAPLEXRX EX GEL: Freq: Once | CUTANEOUS | Status: AC

## 2022-01-10 ENCOUNTER — Ambulatory Visit
Admission: RE | Admit: 2022-01-10 | Discharge: 2022-01-10 | Disposition: A | Discharge status: Home / Self Care | Payer: Federal, State, Local not specified - PPO | Source: Ambulatory Visit | Attending: Radiation Oncology | Admitting: Radiation Oncology

## 2022-01-10 DIAGNOSIS — C50211 Malignant neoplasm of upper-inner quadrant of right female breast: Secondary | ICD-10-CM | POA: Diagnosis not present

## 2022-01-11 ENCOUNTER — Encounter: Payer: Self-pay | Admitting: Radiation Oncology

## 2022-01-11 ENCOUNTER — Other Ambulatory Visit: Payer: Self-pay

## 2022-01-11 ENCOUNTER — Ambulatory Visit
Admission: RE | Admit: 2022-01-11 | Discharge: 2022-01-11 | Disposition: A | Discharge status: Home / Self Care | Payer: Federal, State, Local not specified - PPO | Source: Ambulatory Visit | Attending: Radiation Oncology | Admitting: Radiation Oncology

## 2022-01-11 DIAGNOSIS — C50211 Malignant neoplasm of upper-inner quadrant of right female breast: Secondary | ICD-10-CM | POA: Diagnosis not present

## 2022-01-12 ENCOUNTER — Encounter: Payer: Self-pay | Admitting: *Deleted

## 2022-01-12 DIAGNOSIS — C50211 Malignant neoplasm of upper-inner quadrant of right female breast: Secondary | ICD-10-CM

## 2022-01-23 ENCOUNTER — Encounter: Payer: Self-pay | Admitting: Hematology and Oncology

## 2022-01-23 NOTE — Progress Notes (Signed)
Patient Name: Chelsea Wise °MRN: [id number] °DOB: 09/01/1981 °Referring Physician: GUDENA VINAY (Profile Not Attached) °Date of Service: 01/11/2022 °[facility] [place] ° °                                                      End Of Treatment Note ° °Diagnoses: C50.211-Malignant neoplasm of upper-inner quadrant of right female breast ° °Cancer Staging:  Cancer Staging  °Malignant neoplasm of upper-inner quadrant of right breast in female, estrogen receptor positive ([facility]) °Staging form: Breast, AJCC 8th Edition °- Clinical stage from 04/08/2021: Stage IIA (cT2, cN1, cM0, G2, ER+, PR+, HER2-) - Signed by Gudena, Vinay, MD on 04/13/2021 °Stage prefix: Initial diagnosis °Histologic grading system: 3 grade system ° ° °ypT3, ypN1a ° ° °Intent: Curative ° °Radiation Treatment Dates: 11/30/2021 through 01/11/2022 °Site Technique Total Dose (Gy) Dose per Fx (Gy) Completed Fx Beam Energies  °Chest Wall, Right: CW_R_IMN 3D 50/50 2 25/25 6X, 10X  °Chest Wall, Right: CW_R_PAB_SCV 3D 50/50 2 25/25 6X, 10X  °Chest Wall, Right: CW_R_Bst Electron 10/10 2 5/5 6E  ° °Narrative: The patient tolerated radiation therapy relatively well.  ° °Plan: The patient will follow-up with radiation oncology in 1mo. °----------------------------------- ° °Sarah Squire, MD ° °

## 2022-01-30 ENCOUNTER — Ambulatory Visit: Payer: Federal, State, Local not specified - PPO | Attending: General Surgery

## 2022-01-30 ENCOUNTER — Other Ambulatory Visit: Payer: Self-pay

## 2022-01-30 VITALS — Wt 134.1 lb

## 2022-01-30 DIAGNOSIS — Z483 Aftercare following surgery for neoplasm: Secondary | ICD-10-CM

## 2022-01-30 NOTE — Therapy (Signed)
[facility] @ [facility] [place], [place], [address] Phone: [phone]   Fax:  [phone]  Physical Therapy Treatment  Patient Details  Name: Chelsea Wise MRN: [id number] Date of Birth: 20-Jun-1981 Referring Provider (PT): Dr. Rolm Bookbinder   Encounter Date: 01/30/2022   PT End of Session - 01/30/22 0936     Visit Number 10   # unchanged due to screen only   PT Start Time 0934    PT Stop Time 0941    PT Time Calculation (min) 7 min    Activity Tolerance Patient tolerated treatment well    Behavior During Therapy [facility] for tasks assessed/performed             Past Medical History:  Diagnosis Date   Cancer ([place])    Breast   Family history of breast cancer    Family history of lung cancer    Family history of pancreatic cancer    Family history of prostate cancer    Medical history non-contributory    Placenta previa    SVD (spontaneous vaginal delivery) 01/18/2014    Past Surgical History:  Procedure Laterality Date   MASTECTOMY W/ SENTINEL NODE BIOPSY Right 10/21/2021   Procedure: RIGHT MASTECTOMY WITH RIGHT AXILLARY SENTINEL LYMPH NODE BIOPSY;  Surgeon: Rolm Bookbinder, MD;  Location: [place];  Service: General;  Laterality: Right;   NO PAST SURGERIES     PORT-A-CATH REMOVAL Left 10/21/2021   Procedure: REMOVAL PORT-A-CATH;  Surgeon: Rolm Bookbinder, MD;  Location: [facility];  Service: General;  Laterality: Left;   PORTACATH PLACEMENT Left 05/09/2021   Procedure: INSERTION PORT-A-CATH;  Surgeon: Rolm Bookbinder, MD;  Location: [facility];  Service: General;  Laterality: Left;   RADIOACTIVE SEED GUIDED AXILLARY SENTINEL LYMPH NODE Right 10/21/2021   Procedure: RADIOACTIVE SEED GUIDED RIGHT AXILLARY LYMPH NODE EXCISION;  Surgeon: Rolm Bookbinder, MD;  Location: [place];  Service: General;  Laterality: Right;   TOTAL MASTECTOMY Left 10/21/2021    Procedure: LEFT TOTAL MASTECTOMY;  Surgeon: Rolm Bookbinder, MD;  Location: [facility];  Service: General;  Laterality: Left;   WISDOM TOOTH EXTRACTION      Vitals:   01/30/22 0936  Weight: 134 lb 2 oz (60.8 kg)     Subjective Assessment - 01/30/22 0936     Subjective Pt returns for her 3 month L-Dex screen.    Pertinent History Patient was diagnosed on 04/11/2021 with right grade II invasive ductal carcinoma breast cancer. It measures 4.5 cm and is located in the upper inner quadrant. It is ER positive, weakly PR positive, and HER2 equivocal. Ki67 is 25%. She has 2 abnormal appearing axillary lymph nodes. One was biopsied and found to be positive.                    L-DEX FLOWSHEETS - 01/30/22 0900       L-DEX LYMPHEDEMA SCREENING   Measurement Type Unilateral    L-DEX MEASUREMENT EXTREMITY Upper Extremity    POSITION  Standing    DOMINANT SIDE Right    At Risk Side Right    BASELINE SCORE (UNILATERAL) 1.6    L-DEX SCORE (UNILATERAL) 3.9    VALUE CHANGE (UNILAT) 2.3                                     PT Long  Term Goals - 12/15/21 1146       PT LONG TERM GOAL #1   Title Patient will demonstrate she has regained full shoulder ROM and function post op compared to baselines.    Status Achieved      PT LONG TERM GOAL #2   Title Patient will improve her DASH score to be back to zero for improved overall upper extremity function.    Status Partially Met      PT LONG TERM GOAL #3   Title Patient will increase bilateral shoulder flexion to >/= 150 degrees for increased ease reaching overhead.    Status Achieved      PT LONG TERM GOAL #4   Title Patient will increase bilateral shoulder abduction to >/= 160 degrees for improved ability to obtain radiation positioning.    Status Achieved      PT LONG TERM GOAL #5   Title Patient will be independent in her HEP for impvoed shoulder function.    Status Achieved                    Plan - 01/30/22 1155     Clinical Impression Statement Pt returns for her 3 month L-Dex screen. Her change from baseline of 2.3 is WNLs so no further treatment is required at this time except to cont every 3 month L-Dex screens which pt is agreeable to.    PT Next Visit Plan Cont every 3 month L-Dex screens for up to 2 years from her SLNB (~10/22/2023)    Consulted and Agree with Plan of Care Patient             Patient will benefit from skilled therapeutic intervention in order to improve the following deficits and impairments:     Visit Diagnosis: Aftercare following surgery for neoplasm     Problem List Patient Active Problem List   Diagnosis Date Noted   S/P bilateral mastectomy 10/21/2021   Port-A-Cath in place 06/07/2021   Genetic testing 04/19/2021   Malignant neoplasm of upper-inner quadrant of right breast in female, estrogen receptor positive ([place]) 04/13/2021   Family history of prostate cancer    Family history of pancreatic cancer    Family history of breast cancer    Family history of lung cancer    Active labor 03/15/2016   Postpartum care following vaginal delivery 03/15/2016   SVD (spontaneous vaginal delivery) 01/18/2014   Labor and delivery, indication for care 01/17/2014   Antepartum bleeding, third trimester 01/01/2014   Placenta previa in second trimester 11/28/2013    Otelia Limes, PTA 01/30/2022, 9:42 AM  [facility] @ [address], [place], [address] Phone: [phone]   Fax:  [phone]  Name: Chelsea Wise MRN: [id number] Date of Birth: 09-May-1981

## 2022-01-31 ENCOUNTER — Encounter (HOSPITAL_COMMUNITY): Payer: Self-pay

## 2022-02-15 ENCOUNTER — Telehealth: Payer: Self-pay

## 2022-02-15 ENCOUNTER — Ambulatory Visit: Payer: Federal, State, Local not specified - PPO | Admitting: Radiation Oncology

## 2022-02-15 NOTE — Telephone Encounter (Signed)
I called the patient today about her upcoming follow-up appointment in radiation oncology.  ? ?Given the state of the COVID-19 pandemic, concerning case numbers in our community, and guidance from [facility], I offered a phone assessment with the patient to determine if coming to the clinic was necessary. She accepted. ? ?The patient denies any symptomatic concerns. She reports her fatigue has improved slightly and remains manageable. She denies any new or worsening pain to her chest. She does occasionally have tightness in her right shoulder, but she saw her surgeon Dr. Donne Hazel earlier this month and was assured everything appeared WNL. She states she is diligent about stretching that area and doing the exercises provided by PT. Specifically, she reports good healing of her skin in the radiation fields. She does report lingering hyperpigmentation to skin in treatment field, but peeling has resolved and skin is intact. I recommended that she continue skin care by applying oil or lotion with vitamin E to the skin in the radiation fields, BID, for 2 more months.   ? ?Continue follow-up with medical oncology - follow-up is scheduled on 04/03/2022 with Annabelle Harman in the [facility].  I explained that yearly mammograms are important for patients with intact breast tissue, and physical exams are important after mastectomy for patients that cannot undergo mammography. ? ?I encouraged her to call if she had further questions or concerns about her healing. Otherwise, she will follow-up PRN in radiation oncology. Patient is pleased with this plan, and we will cancel her upcoming follow-up to reduce the risk of COVID-19 transmission. ? ? ?

## 2022-03-26 IMAGING — MR MR BREAST BILAT WO/W CM
8 of 12 series · 33 of 48 positions shown · IV contrast (6 ml gadavist)
Comparison: Previous exam(s).

CLINICAL DATA: 39-year-old female with history of invasive right
breast cancer x2 and metastatic right axillary lymph node diagnosed
neoadjuvant chemotherapy.

LABS:  None.
EXAM:
BILATERAL BREAST MRI WITH AND WITHOUT CONTRAST
TECHNIQUE: Multiplanar, multisequence MR images of both breasts were obtained
prior to and following the intravenous administration of 6 ml of
Gadavist

[Series 2: t2_tirm_tra ipat (a-p) · axial · 3.0mm · 0.64mm/px · 1 of 55 slices shown]
[im 1/55]
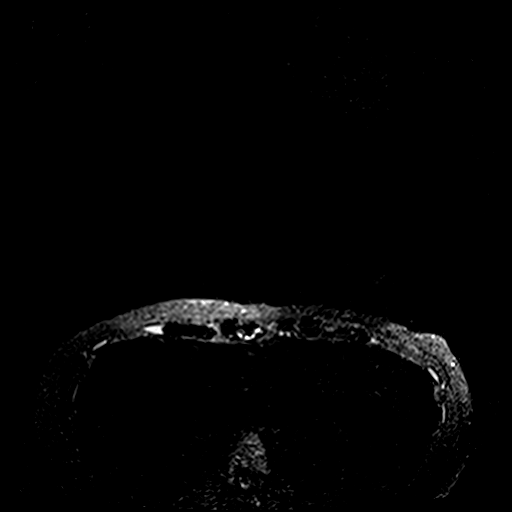

[Series 3: fl3d pre-cm no · axial · non-contrast · 1.2mm · 0.86mm/px · z∈[-103,+69]mm · 5 of 144 slices shown]
[im 1/144]
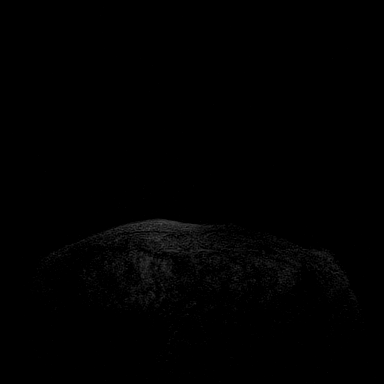
[im 36/144]
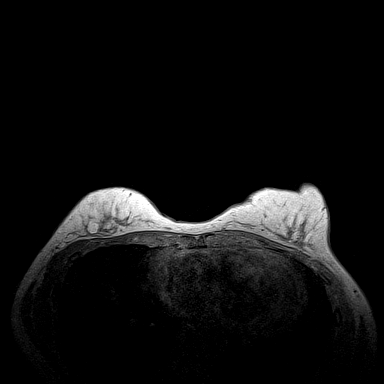
[im 72/144]
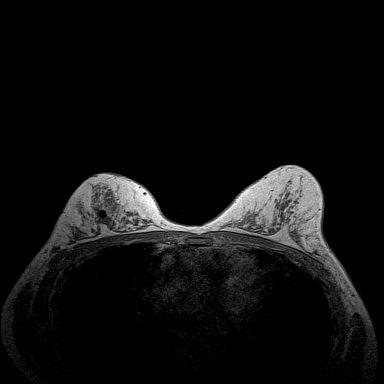
[im 108/144]
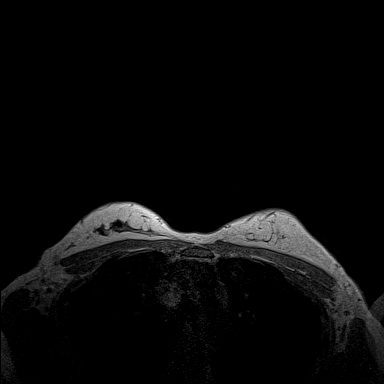
[im 144/144]
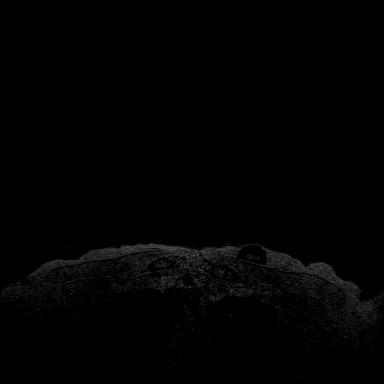

[Series 4: fl3d pre-cm · axial · non-contrast · 1.2mm · 0.86mm/px · z∈[-103,+69]mm · 5 of 144 slices shown]
[im 1/144]
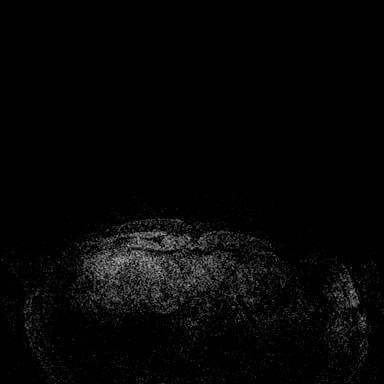
[im 36/144]
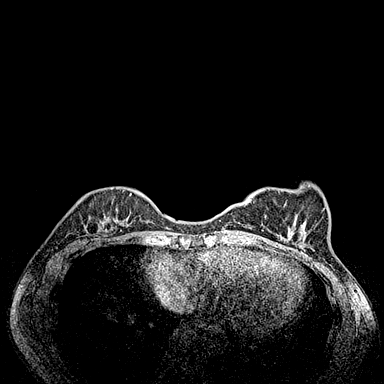
[im 72/144]
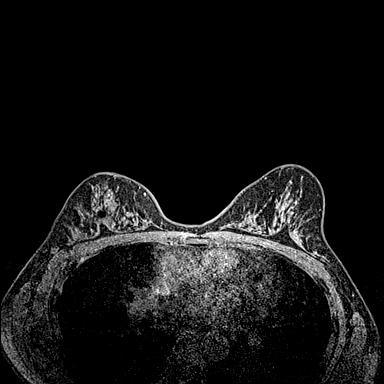
[im 108/144]
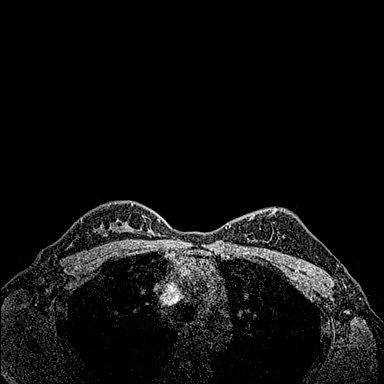
[im 144/144]
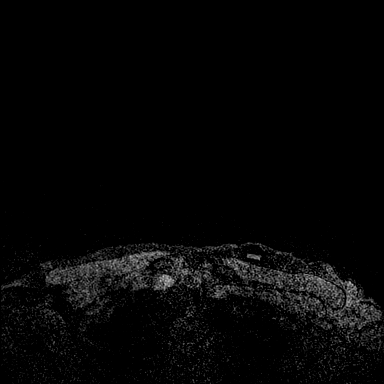

[Series 5: fl3d post-cm 20 · axial · 1.2mm · 0.86mm/px · z∈[-103,+69]mm · 5 of 144 slices shown (1 of 3)]
[im 1/144]
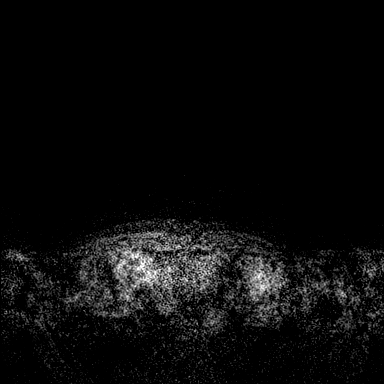
[im 36/144]
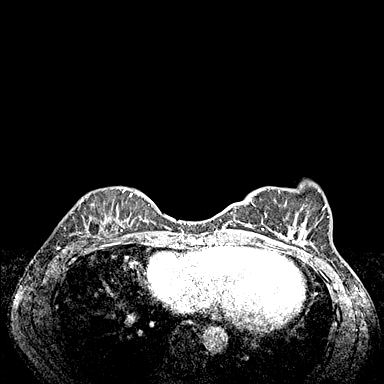
[im 72/144]
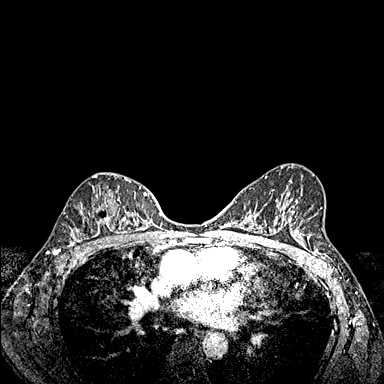
[im 108/144]
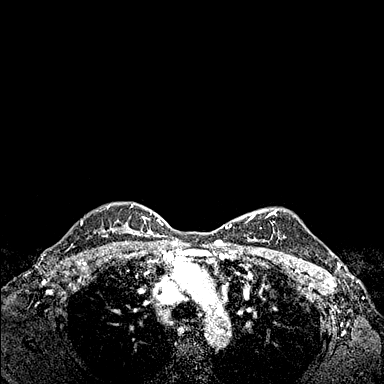
[im 144/144]
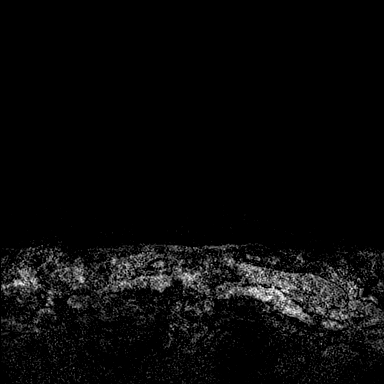

[Series 6: fl3d post-cm 20 · axial · 1.2mm · 0.86mm/px · z∈[-103,+69]mm · 5 of 144 slices shown (2 of 3)]
[im 1/144]
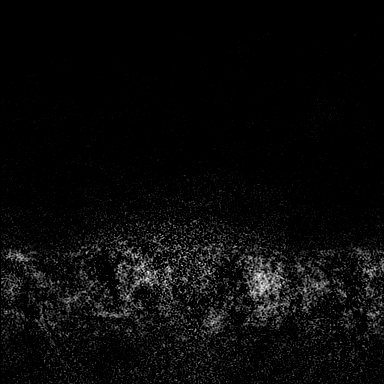
[im 36/144]
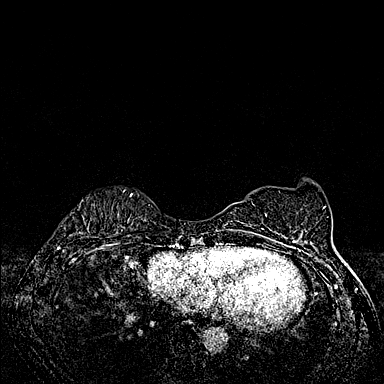
[im 72/144]
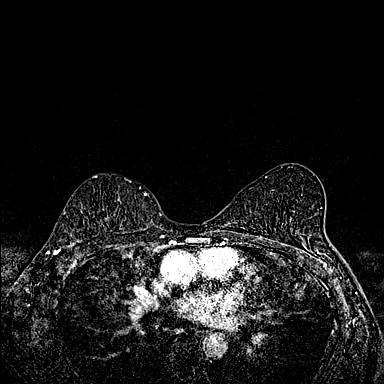
[im 108/144]
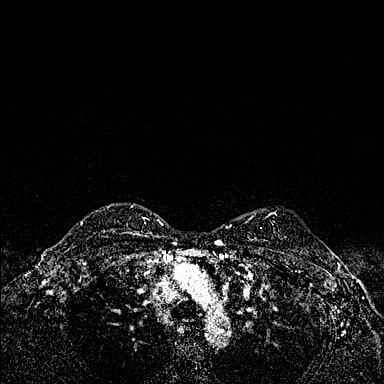
[im 144/144]
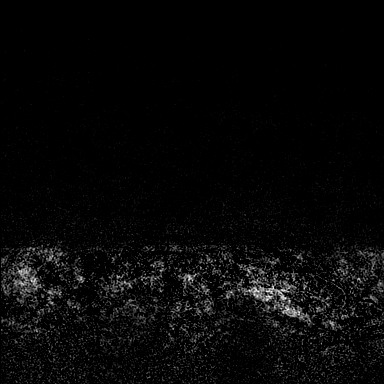

[Series 7: fl3d post-cm 20 · axial · 172.8mm · 0.86mm/px · 1 of 1 slices shown (3 of 3)]
[im 1/1]
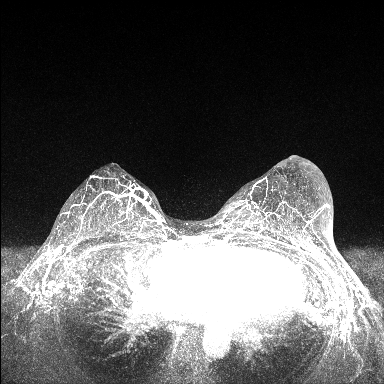

[Series 8: fl3d post-cm 3 · axial · 1.2mm · 0.86mm/px · z∈[-103,+69]mm · 6 of 144 slices shown (1 of 2)]
[im 1/144]
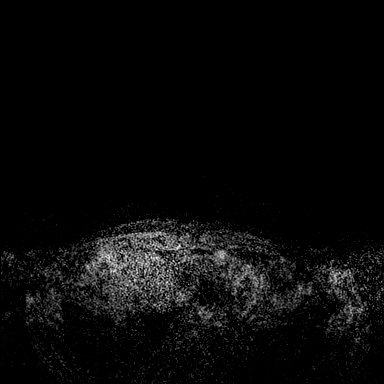
[im 29/144]
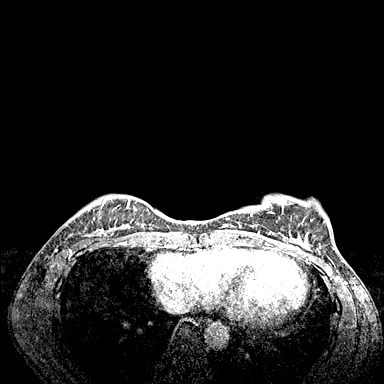
[im 58/144]
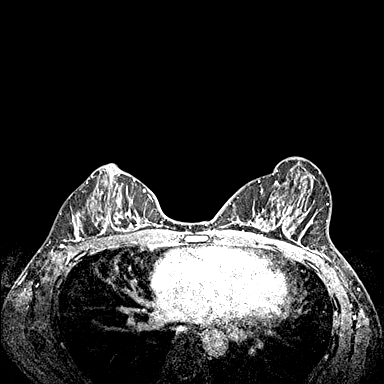
[im 86/144]
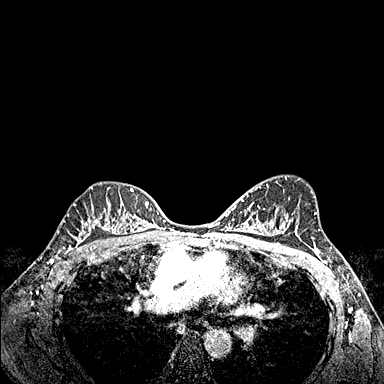
[im 115/144]
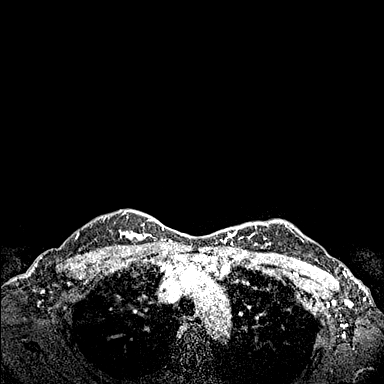
[im 144/144]
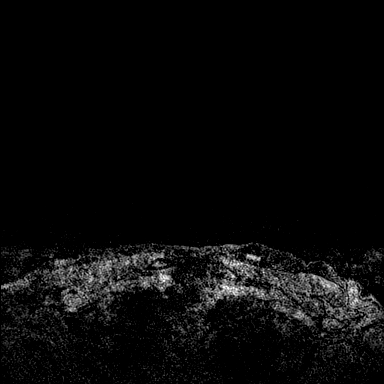

[Series 9: fl3d post-cm 3 · axial · 1.2mm · 0.86mm/px · z∈[-103,+34]mm · 5 of 144 slices shown (2 of 2)]
[im 1/144]
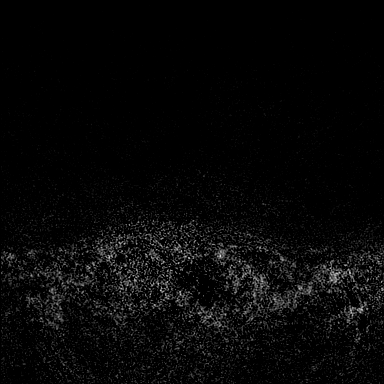
[im 29/144]
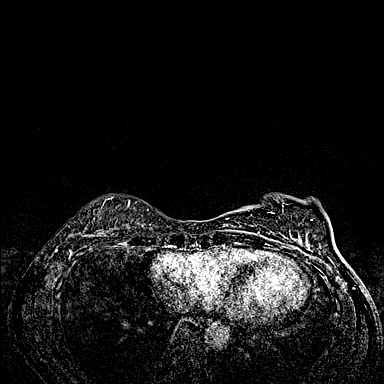
[im 58/144]
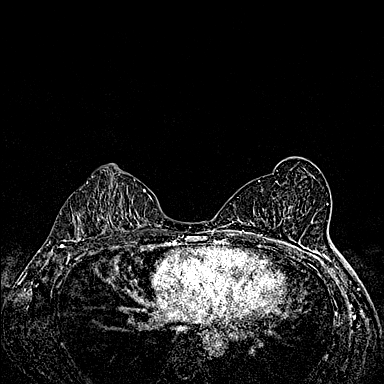
[im 86/144]
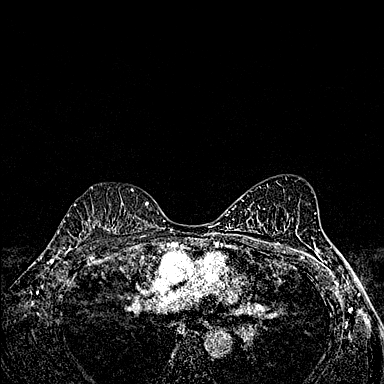
[im 115/144]
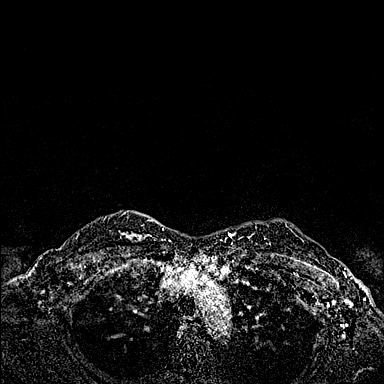

[33 of 48 positions shown; findings below may reference images not displayed]

Three-dimensional MR images were rendered by post-processing of the
original MR data on an independent workstation. The
three-dimensional MR images were interpreted, and findings are
reported in the following complete MRI report for this study. Three
dimensional images were evaluated at the independent interpreting
workstation using the DynaCAD thin client.
FINDINGS: Breast composition: c. Heterogeneous fibroglandular tissue.

Background parenchymal enhancement: Mild

Right breast: Interval significant decrease in the previously seen
large mass in the upper right breast. The mass now measures
approximately 2.0 x 0.6 x 1.3 cm (series 6, image 33), previously
measuring 4.9 x 2.6 x 5.7 cm. No new suspicious enhancement.

Left breast: No mass or abnormal enhancement.

Lymph nodes: Interval decrease in size of several right axillary
lymph nodes. There are no abnormal right axillary lymph nodes. No
abnormal left axillary lymph nodes.

Ancillary findings:  None.
IMPRESSION: 1. Interval marked decrease in size of the biopsy-proven malignancy
in the upper right breast, now measuring 2.0 x 0.6 x 1.3 cm,
previously measuring 4.9 x 2.6 x 5.7 cm.
2. Interval decrease in size of several right axillary lymph nodes.
No abnormal lymph nodes bilaterally.
3. No abnormal enhancement in the left breast.

RECOMMENDATION:
Continue treatment plan for known right breast cancer.

BI-RADS CATEGORY  6: Known biopsy-proven malignancy.

## 2022-04-03 ENCOUNTER — Encounter: Payer: Self-pay | Admitting: Adult Health

## 2022-04-03 ENCOUNTER — Inpatient Hospital Stay: Payer: Federal, State, Local not specified - PPO | Attending: Adult Health | Admitting: Adult Health

## 2022-04-03 ENCOUNTER — Other Ambulatory Visit: Payer: Self-pay

## 2022-04-03 VITALS — BP 128/86 | HR 80 | Temp 97.9°F | Resp 18 | Ht 63.0 in | Wt 141.0 lb

## 2022-04-03 DIAGNOSIS — Z8042 Family history of malignant neoplasm of prostate: Secondary | ICD-10-CM | POA: Diagnosis not present

## 2022-04-03 DIAGNOSIS — C50211 Malignant neoplasm of upper-inner quadrant of right female breast: Secondary | ICD-10-CM

## 2022-04-03 DIAGNOSIS — Z803 Family history of malignant neoplasm of breast: Secondary | ICD-10-CM | POA: Diagnosis not present

## 2022-04-03 DIAGNOSIS — Z801 Family history of malignant neoplasm of trachea, bronchus and lung: Secondary | ICD-10-CM | POA: Insufficient documentation

## 2022-04-03 DIAGNOSIS — Z170 Estrogen receptor positive status [ER+]: Secondary | ICD-10-CM | POA: Diagnosis not present

## 2022-04-03 DIAGNOSIS — C50411 Malignant neoplasm of upper-outer quadrant of right female breast: Secondary | ICD-10-CM | POA: Insufficient documentation

## 2022-04-03 DIAGNOSIS — Z800 Family history of malignant neoplasm of digestive organs: Secondary | ICD-10-CM | POA: Insufficient documentation

## 2022-04-03 MED ORDER — TAMOXIFEN CITRATE 20 MG PO TABS: 20.0000 mg | ORAL_TABLET | Freq: Every day | ORAL | 0 refills | Status: DC

## 2022-04-03 NOTE — Progress Notes (Signed)
SURVIVORSHIP VISIT: ? ? ?BRIEF ONCOLOGIC HISTORY:  ?Oncology History  ?Malignant neoplasm of upper-inner quadrant of right breast in female, estrogen receptor positive ([place])  ?04/08/2021 Initial Diagnosis  ? Palpable right breast mass: Mammogram and US showed a 4.5cm right breast mass at the 12-1 o'clock position with associated microcalcifications, multiple additional masses from the 9-11 o'clock position in the right breast, calcifications in the subareolar right breast, 1.4cm, and two enlarged right axillary lymph nodes. Biopsy showed invasive and in situ ductal carcinoma in the breast and axilla, grade 2 ER 80%, PR 1%, Ki-67 25%, HER2 equivalent by IHC ?  ?04/08/2021 Cancer Staging  ? Staging form: Breast, AJCC 8th Edition ?- Clinical stage from 04/08/2021: Stage IIA (cT2, cN1, cM0, G2, ER+, PR+, HER2-) - Signed by Nicholas Lose, MD on 04/13/2021 ?Stage prefix: Initial diagnosis ?Histologic grading system: 3 grade system ? ?  ?04/19/2021 Genetic Testing  ? Negative genetic testing:  No pathogenic variants detected on the [employer] BRCAplus or CancerNext-Expanded + RNAinsight panels. Two variants of uncertain significance (VUS) were detected - one in the APC gene called p.T2422I (c.7265C>T) and a second in the BAP1 gene called p.P302L (c.905C>T). The report dates are 04/19/2021 and 04/25/2021, respectively. ? ?The BRCAplus panel offered by [employer] and includes sequencing and deletion/duplication analysis for the following 8 genes: ATM, BRCA1, BRCA2, CDH1, CHEK2, PALB2, PTEN, and TP53. The CancerNext-Expanded + RNAinsight gene panel offered by [employer] and includes sequencing and rearrangement analysis for the following 77 genes: AIP, ALK, APC, ATM, AXIN2, BAP1, BARD1, BLM, BMPR1A, BRCA1, BRCA2, BRIP1, CDC73, CDH1, CDK4, CDKN1B, CDKN2A, CHEK2, CTNNA1, DICER1, FANCC, FH, FLCN, GALNT12, KIF1B, LZTR1, MAX, MEN1, MET, MLH1, MSH2, MSH3, MSH6, MUTYH, NBN, NF1, NF2, NTHL1, PALB2, PHOX2B, PMS2, POT1, PRKAR1A, PTCH1, PTEN,  RAD51C, RAD51D, RB1, RECQL, RET, SDHA, SDHAF2, SDHB, SDHC, SDHD, SMAD4, SMARCA4, SMARCB1, SMARCE1, STK11, SUFU, TMEM127, TP53, TSC1, TSC2, VHL and XRCC2 (sequencing and deletion/duplication); EGFR, EGLN1, HOXB13, KIT, MITF, PDGFRA, POLD1 and POLE (sequencing only); EPCAM and GREM1 (deletion/duplication only). RNA data is routinely analyzed for use in variant interpretation for all genes.  ?  ?05/10/2021 - 09/20/2021 Chemotherapy  ? Patient is on Treatment Plan : BREAST ADJUVANT DOSE DENSE AC q14d / PACLitaxel q7d  ? ?  ?  ?10/21/2021 Surgery  ? Bilateral mastectomies: Left breast benign.  Right breast showed 6 cm of invasive ductal carcinoma, grade 2, margins negative, 2 out of 7 lymph nodes were positive for macrometastases.  ER positive, PR negative, HER2 negative. ?  ?10/21/2021 Cancer Staging  ? Staging form: Breast, AJCC 8th Edition ?- Pathologic stage from 10/21/2021: No Stage Recommended (ypT3, pN1a, cM0, G2, ER+, PR-, HER2-) - Signed by Gardenia Phlegm, NP on 04/03/2022 ?Stage prefix: Post-therapy ?Histologic grading system: 3 grade system ? ?  ?11/30/2021 - 01/11/2022 Radiation Therapy  ? Site Technique Total Dose (Gy) Dose per Fx (Gy) Completed Fx Beam Energies  ?Chest Wall, Right: CW_R_IMN 3D 50/50 2 25/25 6X, 10X  ?Chest Wall, Right: CW_R_PAB_SCV 3D 50/50 2 25/25 6X, 10X  ?Chest Wall, Right: CW_R_Bst Electron 10/10 2 5/5 6E  ? ?  ? ? ?INTERVAL HISTORY:  ?Chelsea Wise to review her survivorship care plan detailing her treatment course for breast cancer, as well as monitoring long-term side effects of that treatment, education regarding health maintenance, screening, and overall wellness and health promotion.    ? ?Overall, Chelsea Wise reports feeling quite well.  She has not yet started antiestrogen therapy.  She still has the papers that  Dr. Lindi Adie gave her and she pulled that out and let me know that she would like to start tamoxifen because she does not want to receive the goserelin injection.  She  does however plan to have her ovaries removed. ? ?REVIEW OF SYSTEMS:  ?Review of Systems  ?Constitutional:  Negative for appetite change, chills, fatigue, fever and unexpected weight change.  ?HENT:   Negative for hearing loss, lump/mass and trouble swallowing.   ?Eyes:  Negative for eye problems and icterus.  ?Respiratory:  Negative for chest tightness, cough and shortness of breath.   ?Cardiovascular:  Negative for chest pain, leg swelling and palpitations.  ?Gastrointestinal:  Negative for abdominal distention, abdominal pain, constipation, diarrhea, nausea and vomiting.  ?Endocrine: Negative for hot flashes.  ?Genitourinary:  Negative for difficulty urinating.   ?Musculoskeletal:  Negative for arthralgias.  ?Skin:  Negative for itching and rash.  ?Neurological:  Negative for dizziness, extremity weakness, headaches and numbness.  ?Hematological:  Negative for adenopathy. Does not bruise/bleed easily.  ?Psychiatric/Behavioral:  Negative for depression. The patient is not nervous/anxious.   ?Breast: Denies any new nodularity, masses, tenderness, nipple changes, or nipple discharge.  ? ? ? ? ?ONCOLOGY TREATMENT TEAM:  ?1. Surgeon:  Dr. Donne Hazel at [facility] ?2. Medical Oncologist: Dr. Lindi Adie  ?3. Radiation Oncologist: Dr. Isidore Moos ?  ? ?PAST MEDICAL/SURGICAL HISTORY:  ?Past Medical History:  ?Diagnosis Date  ? Cancer [facility])   ? Breast  ? Family history of breast cancer   ? Family history of lung cancer   ? Family history of pancreatic cancer   ? Family history of prostate cancer   ? Medical history non-contributory   ? Placenta previa   ? SVD (spontaneous vaginal delivery) 01/18/2014  ? ?Past Surgical History:  ?Procedure Laterality Date  ? MASTECTOMY W/ SENTINEL NODE BIOPSY Right 10/21/2021  ? Procedure: RIGHT MASTECTOMY WITH RIGHT AXILLARY SENTINEL LYMPH NODE BIOPSY;  Surgeon: Rolm Bookbinder, MD;  Location: [place];  Service: General;  Laterality: Right;  ? NO PAST SURGERIES    ?  PORT-A-CATH REMOVAL Left 10/21/2021  ? Procedure: REMOVAL PORT-A-CATH;  Surgeon: Rolm Bookbinder, MD;  Location: [place];  Service: General;  Laterality: Left;  ? PORTACATH PLACEMENT Left 05/09/2021  ? Procedure: INSERTION PORT-A-CATH;  Surgeon: Rolm Bookbinder, MD;  Location: [place];  Service: General;  Laterality: Left;  ? RADIOACTIVE SEED GUIDED AXILLARY SENTINEL LYMPH NODE Right 10/21/2021  ? Procedure: RADIOACTIVE SEED GUIDED RIGHT AXILLARY LYMPH NODE EXCISION;  Surgeon: Rolm Bookbinder, MD;  Location: [place];  Service: General;  Laterality: Right;  ? TOTAL MASTECTOMY Left 10/21/2021  ? Procedure: LEFT TOTAL MASTECTOMY;  Surgeon: Rolm Bookbinder, MD;  Location: [place];  Service: General;  Laterality: Left;  ? WISDOM TOOTH EXTRACTION    ? ? ? ?ALLERGIES:  ?No Known Allergies ? ? ?CURRENT MEDICATIONS:  ?Outpatient Encounter Medications as of 04/03/2022  ?Medication Sig Note  ? Ascorbic Acid (VITAMIN C PO) Take 1 tablet by mouth daily.   ? cetirizine (ZYRTEC) 10 MG tablet Take 10 mg by mouth daily as needed for allergies.   ? Multiple Vitamin (MULTIVITAMIN WITH MINERALS) TABS tablet Take 2 tablets by mouth daily.   ? acetaminophen (TYLENOL) 500 MG tablet Take 500-1,000 mg by mouth every 6 (six) hours as needed for moderate pain.   ? ELDERBERRY PO Take 1 capsule by mouth daily. (Patient not taking: Reported on 04/03/2022)   ? sodium chloride (OCEAN) 0.65 % SOLN  nasal spray Place 1 spray into both nostrils as needed for congestion. (Patient not taking: Reported on 04/03/2022)   ? [DISCONTINUED] hydrocortisone cream 1 % Apply 1 application topically 2 (two) times daily as needed for itching. (Patient not taking: Reported on 04/03/2022)   ? [DISCONTINUED] methocarbamol (ROBAXIN) 500 MG tablet Take 1 tablet (500 mg total) by mouth 4 (four) times daily. (Patient not taking: Reported on 04/03/2022)   ? [DISCONTINUED] Multiple Vitamins-Minerals (ZINC PO) Take 1 tablet  by mouth once a week. (Patient not taking: Reported on 04/03/2022)   ? [DISCONTINUED] nystatin (MYCOSTATIN) 100000 UNIT/ML suspension TAKE 5 MLS (500,000 UNITS TOTAL) BY MOUTH IN THE MORNING AND AT BEDTI

## 2022-04-03 NOTE — Patient Instructions (Signed)
Abemaciclib tablets What is this medication? ABEMACICLIB (a BEM a SYE klib) targets proteins in cancer cells and stops the cancer cells from growing. It treats certain types of breast cancer. This medicine may be used for other purposes; ask your health care provider or pharmacist if you have questions. COMMON BRAND NAME(S): VERZENIO What should I tell my care team before I take this medication? They need to know if you have any of these conditions: blood clots diarrhea infection kidney disease liver disease low white blood cell counts lung disease an unusual or allergic reaction to abemaciclib, other medicines, foods, dyes, or preservatives pregnant or trying to get pregnant breast-feeding How should I use this medication? Take this medicine by mouth. Take it as directed on the prescription label at the same time every day. Do not cut, crush or chew this medicine. Swallow the tablets whole. You can take it with or without food. If it upsets your stomach, take it with food. Your health care provider may change your dose or tell you to stop taking this medicine if you get side effects. Do not change your dose or stop taking it unless your health care provider tells you to. Do not take this medicine with grapefruit juice. Talk to your health care provider about the use of this medicine in children. Special care may be needed. Overdosage: If you think you have taken too much of this medicine contact a poison control center or emergency room at once. NOTE: This medicine is only for you. Do not share this medicine with others. What if I miss a dose? If you miss a dose, skip it. Take your next dose at the normal time. Do not take extra or 2 doses at the same time to make up for the missed dose. What may interact with this medication? bosentan certain antibiotics like erythromycin or clarithromycin certain antivirals for HIV or hepatitis certain medicines for fungal infections like  ketoconazole, itraconazole, or posaconazole certain medicines for seizures like carbamazepine, phenobarbital, phenytoin diltiazem efavirenz grapefruit juice modafinil rifampin verapamil This list may not describe all possible interactions. Give your health care provider a list of all the medicines, herbs, non-prescription drugs, or dietary supplements you use. Also tell them if you smoke, drink alcohol, or use illegal drugs. Some items may interact with your medicine. What should I watch for while using this medication? This medicine may make you feel generally unwell. This is not uncommon as chemotherapy can affect healthy cells as well as cancer cells. Report any side effects. Continue your course of treatment even though you feel ill unless your health care provider tells you to stop. You may need blood work while you are taking this medicine. Do not become pregnant while taking this medicine or for 3 weeks after stopping it. Women should inform their health care provider if they wish to become pregnant or think they might be pregnant. There is a potential for serious harm to an unborn child. Talk to your health care provider for more information. Do not breast-feed an infant while taking this medicine or for 3 weeks after stopping it. This medicine may make it more difficult to father a child. Talk to your health care provider if you are concerned about your fertility. This medicine may increase your risk of getting an infection. Call your health care provider for advice if you get a fever, chills, sore throat, or other symptoms of a cold or flu. Do not treat yourself. Try to avoid being around people   who are sick. Avoid taking medicines that contain aspirin, acetaminophen, ibuprofen, naproxen, or ketoprofen unless instructed by your health care provider. These medicines may hide a fever. Be careful brushing or flossing your teeth or using a toothpick because you may get an infection or bleed  more easily. If you have any dental work done, tell your dentist you are receiving this medicine. What side effects may I notice from receiving this medication? Side effects that you should report to your doctor or health care professional as soon as possible: allergic reactions (skin rash, itching or hives; swelling of the face, lips, or tongue) blood clot (chest pain; shortness of breath; pain, swelling, or warmth in leg) cough diarrhea infection (fever, chills, cough, sore throat, pain or trouble passing urine) light-colored stool liver injury (dark yellow or brown urine; general ill feeling or flu-like symptoms; loss of appetite, right upper belly pain; unusually weak or tired, yellowing of the eyes or skin) stroke (changes in vision; confusion; trouble speaking or understanding; severe headaches; sudden numbness or weakness of the face, arm or leg; trouble walking; dizziness; loss of balance or coordination) trouble breathing Side effects that usually do not require medical attention (report these to your doctor or health care professional if they continue or are bothersome): constipation headache loss of appetite nausea, vomiting stomach pain unusual bruising or bleeding This list may not describe all possible side effects. Call your doctor for medical advice about side effects. You may report side effects to FDA at 1-800-FDA-1088. Where should I keep my medication? Keep out of the reach of children and pets. Store at room temperature between 20 and 25 degrees C (68 and 77 degrees F). Get rid of any unused medicine after the expiration date. To get rid of medicines that are no longer needed or have expired: Take the medicine to a medicine take-back program. Check with your pharmacy or law enforcement to find a location. If you cannot return the medicine, check the label or package insert to see if the medicine should be thrown out in the garbage or flushed down the toilet. If you are  not sure, ask your health care provider. If it is safe to put in the trash, take the medicine out of the container. Mix the medicine with cat litter, dirt, coffee grounds, or other unwanted substance. Seal the mixture in a bag or container. Put it in the trash. NOTE: This sheet is a summary. It may not cover all possible information. If you have questions about this medicine, talk to your doctor, pharmacist, or health care provider.  2023 Elsevier/Gold Standard (2020-09-29 00:00:00)  

## 2022-04-21 IMAGING — US US NEEDLE LOCALIZATION*R*
1 series · 1 of 1 positions shown · non-contrast
Comparison: Previous exam(s).

CLINICAL DATA: The patient has known right breast cancer. The
patient had a lymph node with metastatic disease biopsied in Saturday April, 2021. The patient has had chemotherapy. The patient is going to
surgery for a right mastectomy and a right lymph node dissection.
The patient is here to localize the previously biopsied right
axillary node.

EXAM:
ULTRASOUND GUIDED RADIOACTIVE SEED LOCALIZATION OF THE RIGHT BREAST

[Series 1: us needle localization*right* · 0.05mm/px · 1 of 1 slices shown]
[im 1/1]
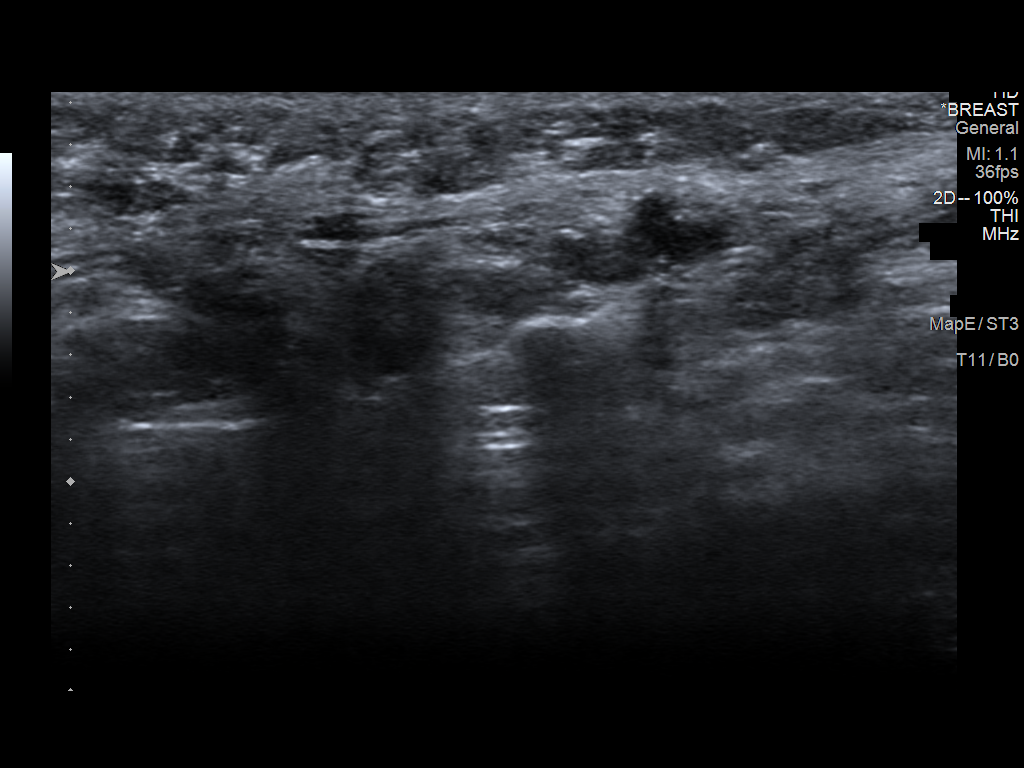

[1 of 1 positions shown; findings below may reference images not displayed]



The usual time-out protocol was performed immediately prior to the
procedure.

The sonographer and myself both attempted to find the lymph node
containing the Apoorva Grainger biopsy marker. When we thought we had
identified the appropriate node containing the clip, we proceeded to
seed placement.

Using ultrasound guidance, sterile technique, 1% lidocaine and an
W-KI1 radioactive seed, the targeted lymph node thought to contain
the biopsy clip was localized using a lateral approach.

Unfortunately, follow-up mammographic images demonstrate the lymph
node containing the biopsy clip which does NOT contain the
radioactive seed. Despite multiple attempts, we were not able to
obtain an axillary image demonstrating the radioactive seed due to
its depth.

Follow-up survey of the patient confirms presence of the radioactive
seed.

Order number of W-KI1 seed:  [id number].

Total activity:  0.246 millicuries reference Date: May 23, 2021

The patient tolerated the procedure well and was released from the
[REDACTED]. She was given instructions regarding seed removal.
IMPRESSION: Radioactive seed localization right breast.

IT IS IMPORTANT TO NOTE THE RADIOACTIVE SEED WAS NOT PLACED INTO THE
LYMPH NODE CONTAINING THE BIOPSY CLIP.

## 2022-04-21 IMAGING — MG MM BREAST LOCALIZATION CLIP
8 series · 9 of 24 positions shown · non-contrast
Comparison: Previous exam(s).

CLINICAL DATA: Evaluate radioactive seed placement.

EXAM:
3D DIAGNOSTIC RIGHT MAMMOGRAM POST ULTRASOUND GUIDED RADIOACTIVE
SEED PLACEMENT

[R MLO synth-2D (1 of 2)]
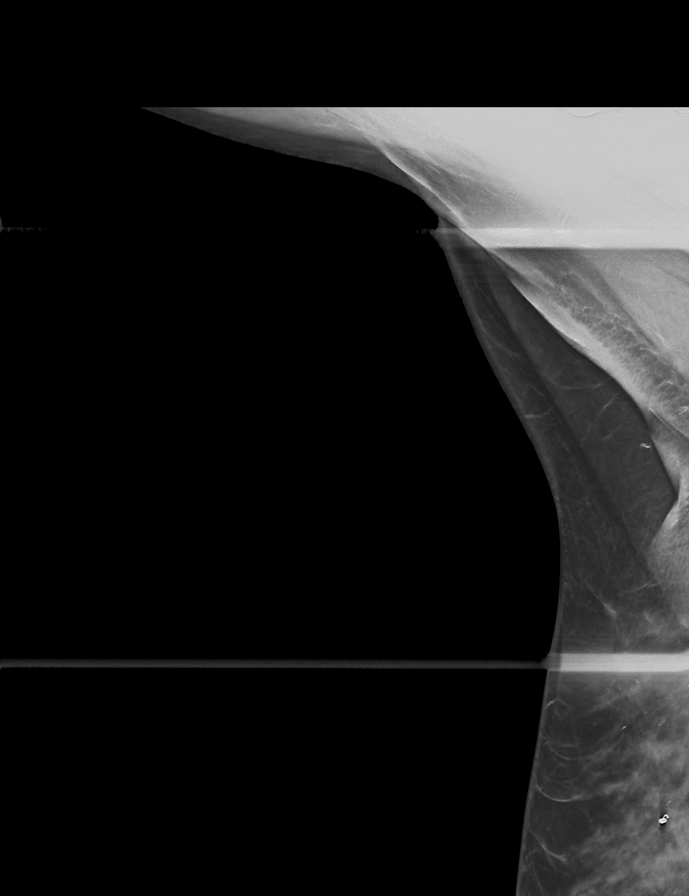

[R ML synth-2D (1 of 2)]
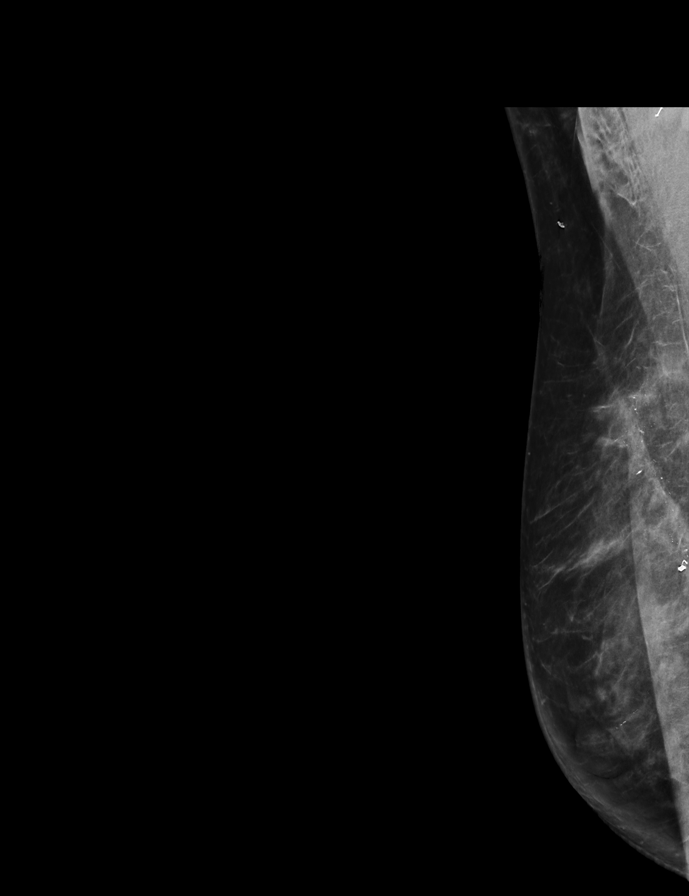

[R MLO synth-2D (2 of 2)]
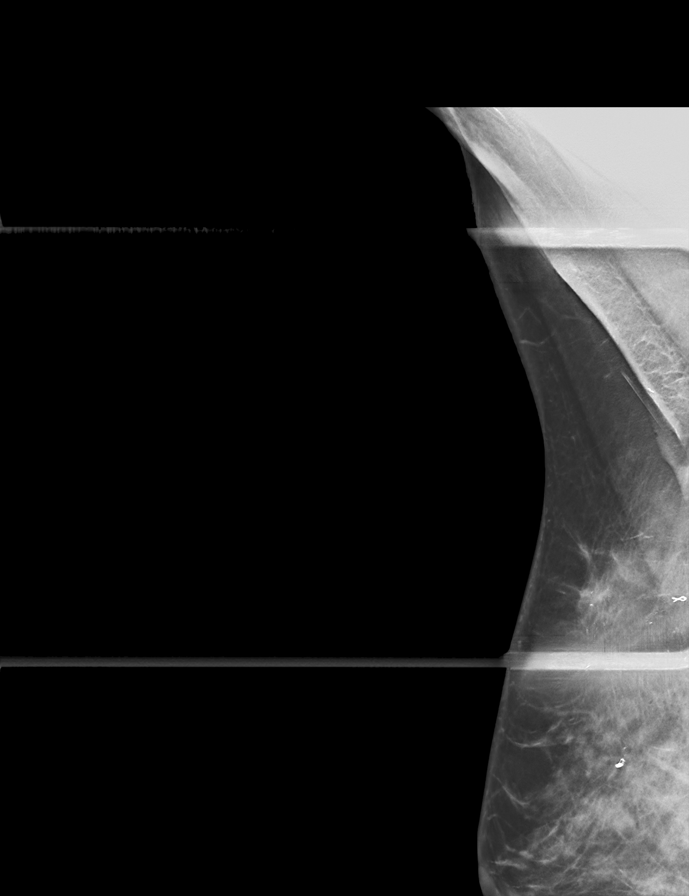

[R ML synth-2D (2 of 2)]
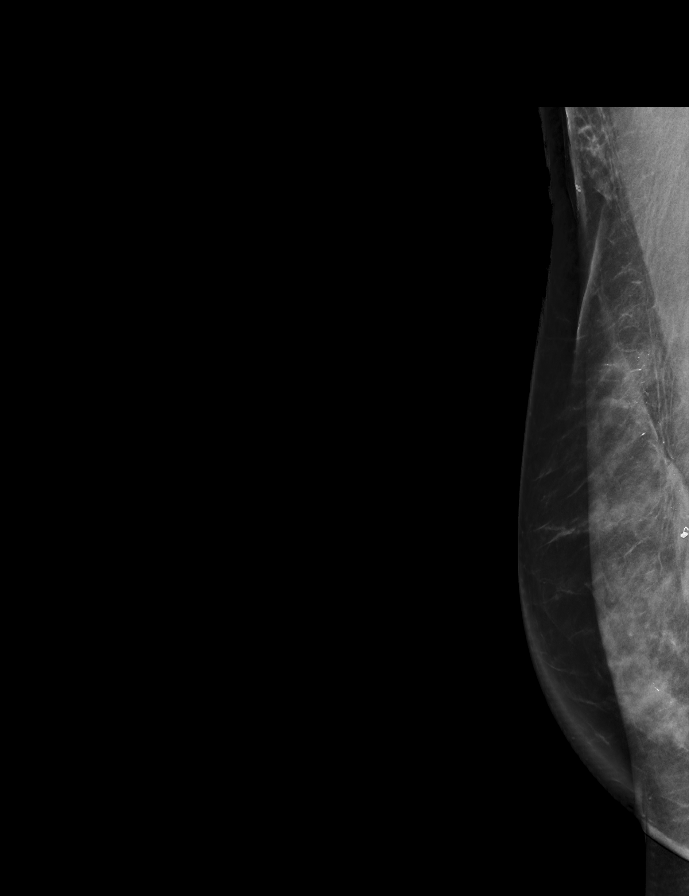

[R ML tomo · 2 of 77 frames shown (1 of 2)]
[frame 25/77]
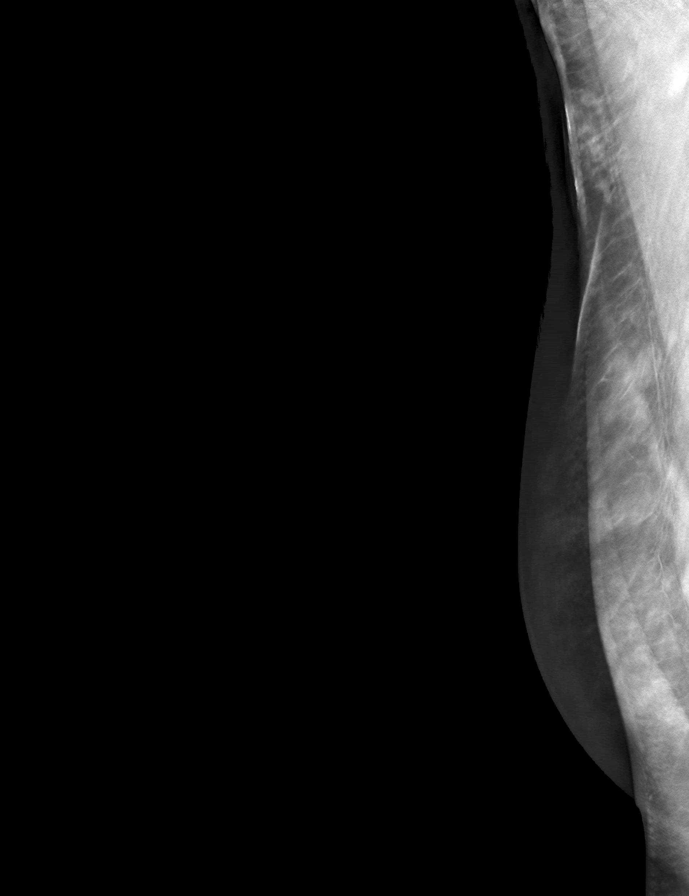
[frame 39/77]
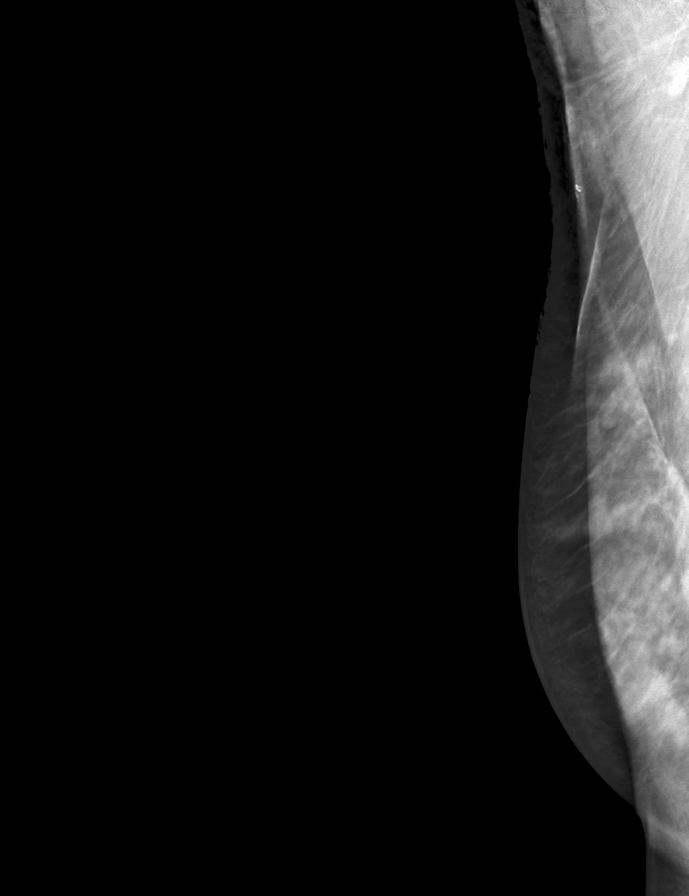

[R MLO tomo (1 of 2) · tomo slice 35/68.0]
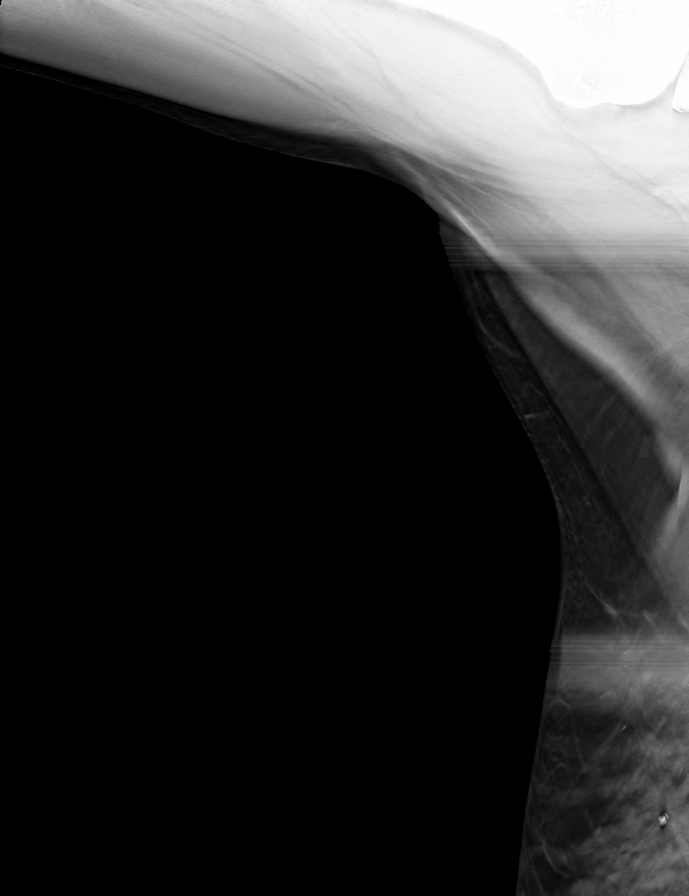

[R MLO tomo (2 of 2) · tomo slice 34/67.0]
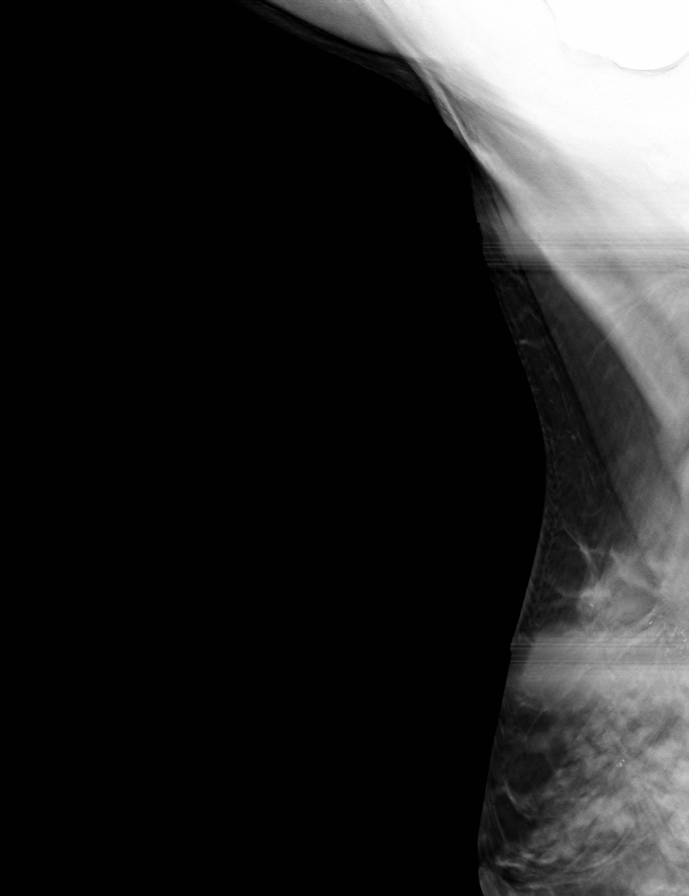

[R ML tomo (2 of 2) · tomo slice 34/67.0]
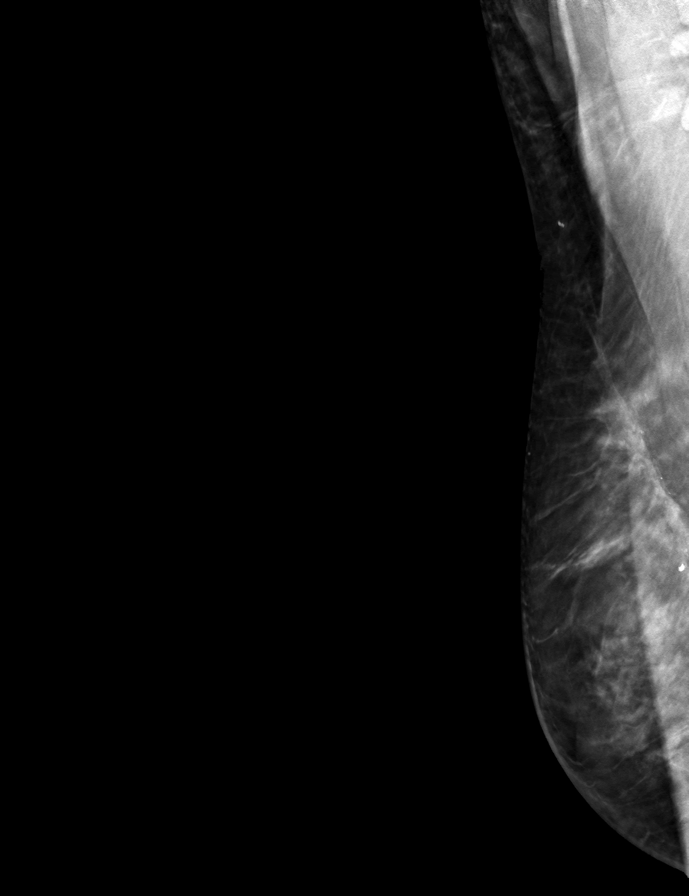

[9 of 24 positions shown; findings below may reference images not displayed]

FINDINGS: 3D Mammographic images were obtained following ultrasound guided
radioactive seed placement. The lymph node containing the biopsy
clip, noted to be metastatic in Saturday April, 2021, is identified.
Unfortunately, the radioactive seed is NOT within this lymph node.
The radioactive seed could not be pulled into view. Survey after the
procedure indicates the radioactive seed was placed
IMPRESSION: THE RADIOACTIVE SEED IS NOT WITHIN THE LYMPH NODE CONTAINING THE
BIOPSY CLIP. THE RADIOACTIVE SEED IS PRESENT BASED ON THE SURVEY
OBTAINED AFTER PLACEMENT. HOWEVER, THE RADIOACTIVE SEED CANNOT BE TO
SEEN ON TODAY'S IMAGING.

Final Assessment: Post Procedure Mammograms for Marker Placement

## 2022-04-24 ENCOUNTER — Ambulatory Visit: Payer: Federal, State, Local not specified - PPO | Attending: General Surgery

## 2022-04-24 VITALS — Wt 139.5 lb

## 2022-04-24 DIAGNOSIS — Z483 Aftercare following surgery for neoplasm: Secondary | ICD-10-CM | POA: Insufficient documentation

## 2022-04-24 NOTE — Therapy (Signed)
OUTPATIENT PHYSICAL THERAPY SOZO SCREENING NOTE   Patient Name: Chelsea Wise MRN: [id number] DOB:[birth year], 41 y.o., female Today's Date: 04/24/2022  PCP: Sherlyn Hay, DO REFERRING PROVIDER: Rolm Bookbinder, MD   PT End of Session - 04/24/22 1013     Visit Number 10   # unchanged due to screen only   PT Start Time 1011    PT Stop Time 1016    PT Time Calculation (min) 5 min    Activity Tolerance Patient tolerated treatment well    Behavior During Therapy WFL for tasks assessed/performed             Past Medical History:  Diagnosis Date   Cancer ([place])    Breast   Family history of breast cancer    Family history of lung cancer    Family history of pancreatic cancer    Family history of prostate cancer    Medical history non-contributory    Placenta previa    SVD (spontaneous vaginal delivery) 01/18/2014   Past Surgical History:  Procedure Laterality Date   MASTECTOMY W/ SENTINEL NODE BIOPSY Right 10/21/2021   Procedure: RIGHT MASTECTOMY WITH RIGHT AXILLARY SENTINEL LYMPH NODE BIOPSY;  Surgeon: Rolm Bookbinder, MD;  Location: [place];  Service: General;  Laterality: Right;   NO PAST SURGERIES     PORT-A-CATH REMOVAL Left 10/21/2021   Procedure: REMOVAL PORT-A-CATH;  Surgeon: Rolm Bookbinder, MD;  Location: [place];  Service: General;  Laterality: Left;   PORTACATH PLACEMENT Left 05/09/2021   Procedure: INSERTION PORT-A-CATH;  Surgeon: Rolm Bookbinder, MD;  Location: [place];  Service: General;  Laterality: Left;   RADIOACTIVE SEED GUIDED AXILLARY SENTINEL LYMPH NODE Right 10/21/2021   Procedure: RADIOACTIVE SEED GUIDED RIGHT AXILLARY LYMPH NODE EXCISION;  Surgeon: Rolm Bookbinder, MD;  Location: [place];  Service: General;  Laterality: Right;   TOTAL MASTECTOMY Left 10/21/2021   Procedure: LEFT TOTAL MASTECTOMY;  Surgeon: Rolm Bookbinder, MD;  Location: [facility];   Service: General;  Laterality: Left;   WISDOM TOOTH EXTRACTION     Patient Active Problem List   Diagnosis Date Noted   S/P bilateral mastectomy 10/21/2021   Genetic testing 04/19/2021   Malignant neoplasm of upper-inner quadrant of right breast in female, estrogen receptor positive ([place]) 04/13/2021   Family history of prostate cancer    Family history of pancreatic cancer    Family history of breast cancer    Family history of lung cancer    Active labor 03/15/2016   Postpartum care following vaginal delivery 03/15/2016   SVD (spontaneous vaginal delivery) 01/18/2014   Labor and delivery, indication for care 01/17/2014   Antepartum bleeding, third trimester 01/01/2014   Placenta previa in second trimester 11/28/2013    REFERRING DIAG: right breast cancer at risk for lymphedema  THERAPY DIAG:  Aftercare following surgery for neoplasm  PERTINENT HISTORY: Patient was diagnosed on 04/11/2021 with right grade II invasive ductal carcinoma breast cancer. It measures 4.5 cm and is located in the upper inner quadrant. It is ER positive, weakly PR positive, and HER2 equivocal. Ki67 is 25%. She has 2 abnormal appearing axillary lymph nodes. One was biopsied and found to be positive.   PRECAUTIONS: right UE Lymphedema risk, None  SUBJECTIVE: Pt returns for her 3 month L-Dex screen.   PAIN:  Are you having pain? No  SOZO SCREENING: Patient was assessed today using the SOZO machine to determine the lymphedema index score. This was compared to  her baseline score. It was determined that she is within the recommended range when compared to her baseline and no further action is needed at this time. She will continue SOZO screenings. These are done every 3 months for 2 years post operatively followed by every 6 months for 2 years, and then annually.    Otelia Limes, PTA 04/24/2022, 10:15 AM

## 2022-05-30 ENCOUNTER — Telehealth: Payer: Self-pay | Admitting: *Deleted

## 2022-05-30 NOTE — Telephone Encounter (Signed)
Received call from pt stating she is currently scheduled for bilateral oophorectomy 06/12/22.  Per MD pt needing to stop Tamoxifen now prior to surgery.  MD also requesting pt to f/u in office the week after surgery to discuss antiestrogen therapy.  Appt scheduled, pt educated and verbalized understanding.

## 2022-06-01 ENCOUNTER — Encounter (HOSPITAL_BASED_OUTPATIENT_CLINIC_OR_DEPARTMENT_OTHER): Payer: Self-pay | Admitting: Obstetrics and Gynecology

## 2022-06-01 DIAGNOSIS — H5789 Other specified disorders of eye and adnexa: Secondary | ICD-10-CM

## 2022-06-01 HISTORY — DX: Other specified disorders of eye and adnexa: H57.89

## 2022-06-02 ENCOUNTER — Encounter (HOSPITAL_BASED_OUTPATIENT_CLINIC_OR_DEPARTMENT_OTHER): Payer: Self-pay | Admitting: Obstetrics and Gynecology

## 2022-06-02 NOTE — Progress Notes (Addendum)
Spoke w/ via phone for pre-op interview--- pt Lab needs dos---- urine preg (per anes)/  pre-op orders pending             Lab results------ no COVID test -----patient states asymptomatic no test needed Arrive at ------- [id number] on 06-12-2022 NPO after MN NO Solid Food.  Clear liquids from MN until--- 0545 Med rec completed Medications to take morning of surgery ----- none Diabetic medication ----- n/a Patient instructed no nail polish to be worn day of surgery Patient instructed to bring photo id and insurance card day of surgery Patient aware to have Driver (ride ) / caregiver  for 24 hours after surgery -- husband, avery Patient Special Instructions ----- n/a Pre-Op special Istructions ----- sent inbox message in epic to dr Terri Piedra, requested orders Patient verbalized understanding of instructions that were given at this phone interview. Patient denies shortness of breath, chest pain, fever, cough at this phone interview.

## 2022-06-08 NOTE — H&P (Signed)
Chelsea Wise is an 41 y.[id number]  female presenting for laparoscopic bilateral salpingo-oopherectomy.  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 She completed radiation in Feb 2023.  S/P b/l mastectomy. BRCA neg ER +   Her maternal aunt had breast cancer. Father had prostate cancer. No family history of colon, uterine or ovarian cancer.   Pertinent Gynecological History: Menses: flow is light Bleeding: monthly  Contraception: none DES exposure: denies Blood transfusions: none Sexually transmitted diseases: no past history Previous GYN Procedures:  none   Last mammogram: abnormal:    Date:   Last pap: normal Date: 10/2018 OB History: G3, P2012   Menstrual History: Menarche age: 41 Patient's last menstrual period was 12/14/2021.    Past Medical History:  Diagnosis Date   Family history of breast cancer    Family history of lung cancer    Family history of pancreatic cancer    Family history of prostate cancer    History of cancer chemotherapy    right breast cancer  05-10-2021  to 09-20-2021   History of external beam radiation therapy    right breast 12-01-2021 to 01-11-2022   Irritation of right eye 06/01/2022   Malignant neoplasm of upper-inner quadrant of right breast in female, estrogen receptor positive ([place]) 04/08/2021   oncologist--- dr Lindi Adie;  Stage IIA,  IDC/  DCIS;  completed chemo 09-20-2021 , completed radiation 01-11-2022   Wears contact lenses     Past Surgical History:  Procedure Laterality Date   MASTECTOMY W/ SENTINEL NODE BIOPSY Right 10/21/2021   Procedure: RIGHT MASTECTOMY WITH RIGHT AXILLARY SENTINEL LYMPH NODE BIOPSY;  Surgeon: Rolm Bookbinder, MD;  Location: [facility];  Service: General;  Laterality: Right;   PORT-A-CATH REMOVAL Left 10/21/2021   Procedure: REMOVAL PORT-A-CATH;  Surgeon: Rolm Bookbinder, MD;  Location: [place];  Service: General;  Laterality: Left;    PORTACATH PLACEMENT Left 05/09/2021   Procedure: INSERTION PORT-A-CATH;  Surgeon: Rolm Bookbinder, MD;  Location: [place];  Service: General;  Laterality: Left;   RADIOACTIVE SEED [place] Right 10/21/2021   Procedure: RADIOACTIVE SEED GUIDED RIGHT AXILLARY LYMPH NODE EXCISION;  Surgeon: Rolm Bookbinder, MD;  Location: [facility];  Service: General;  Laterality: Right;   TOTAL MASTECTOMY Left 10/21/2021   Procedure: LEFT TOTAL MASTECTOMY;  Surgeon: Rolm Bookbinder, MD;  Location: [facility];  Service: General;  Laterality: Left;   WISDOM TOOTH EXTRACTION      Family History  Problem Relation Age of Onset   Prostate cancer Maternal Grandfather 34   Hypertension Father    Prostate cancer Father 30   Diabetes Maternal Aunt    Hypertension Maternal Aunt    Diabetes Maternal Uncle    Hypertension Maternal Uncle    Pancreatic cancer Paternal Grandmother        dx 18s   Stroke Paternal Grandfather    Breast cancer Maternal Aunt 33   Lung cancer Maternal Aunt 78   Lung cancer Maternal Aunt        smoker   Prostate cancer Paternal Uncle        dx 2s   Prostate cancer Other        paternal great-uncle (PGM's brother)   Lung cancer Maternal Aunt 76       smoker   Lung cancer Maternal Uncle 61       smoker    Social History:  reports that  she has never smoked. She has never used smokeless tobacco. She reports that she does not drink alcohol and does not use drugs.  Allergies: No Known Allergies  No medications prior to admission.    Review of Systems  Constitutional:  Negative for activity change and fatigue.  Eyes:  Negative for photophobia and visual disturbance.  Respiratory:  Negative for chest tightness and shortness of breath.   Cardiovascular:  Negative for chest pain, palpitations and leg swelling.  Gastrointestinal:  Negative for abdominal pain.  Genitourinary:  Negative for pelvic pain and vaginal  bleeding.  Neurological:  Negative for light-headedness.  Psychiatric/Behavioral:  The patient is nervous/anxious.     Height 5' 3.5" (1.613 m), weight 63.5 kg, last menstrual period 12/14/2021, unknown if currently breastfeeding. Physical Exam Constitutional:      Appearance: Normal appearance. She is normal weight.  Pulmonary:     Effort: Pulmonary effort is normal.  Musculoskeletal:        General: Normal range of motion.     Cervical back: Normal range of motion.  Skin:    General: Skin is warm and dry.     Capillary Refill: Capillary refill takes 2 to 3 seconds.  Neurological:     General: No focal deficit present.     Mental Status: She is alert and oriented to person, place, and time. Mental status is at baseline.  Psychiatric:        Mood and Affect: Mood normal.        Behavior: Behavior normal.        Thought Content: Thought content normal.     No results found for this or any previous visit (from the past 24 hour(s)).  No results found.  Assessment/Plan: 95MW [id number] fema;e with history of breast cancer here for prophylactic laparoscopic bilateral salpingo-oopherectomy - Admit  - ERAS protocol - Verify consent  - To OR when ready   Venetia Night Savas Elvin 06/08/2022, 10:27 PM

## 2022-06-12 ENCOUNTER — Encounter (HOSPITAL_BASED_OUTPATIENT_CLINIC_OR_DEPARTMENT_OTHER): Payer: Self-pay | Admitting: Obstetrics and Gynecology

## 2022-06-12 ENCOUNTER — Other Ambulatory Visit: Payer: Self-pay

## 2022-06-12 ENCOUNTER — Ambulatory Visit (HOSPITAL_BASED_OUTPATIENT_CLINIC_OR_DEPARTMENT_OTHER): Payer: Federal, State, Local not specified - PPO | Admitting: Anesthesiology

## 2022-06-12 ENCOUNTER — Encounter (HOSPITAL_BASED_OUTPATIENT_CLINIC_OR_DEPARTMENT_OTHER)
Admission: RE | Disposition: A | Discharge status: Home / Self Care | Payer: Self-pay | Source: Home / Self Care | Attending: Obstetrics and Gynecology

## 2022-06-12 ENCOUNTER — Ambulatory Visit (HOSPITAL_BASED_OUTPATIENT_CLINIC_OR_DEPARTMENT_OTHER)
Admission: RE | Admit: 2022-06-12 | Discharge: 2022-06-12 | Disposition: A | Discharge status: Home / Self Care | Payer: Federal, State, Local not specified - PPO | Attending: Obstetrics and Gynecology | Admitting: Obstetrics and Gynecology

## 2022-06-12 DIAGNOSIS — Z9013 Acquired absence of bilateral breasts and nipples: Secondary | ICD-10-CM | POA: Diagnosis not present

## 2022-06-12 DIAGNOSIS — Z170 Estrogen receptor positive status [ER+]: Secondary | ICD-10-CM | POA: Insufficient documentation

## 2022-06-12 DIAGNOSIS — Z01818 Encounter for other preprocedural examination: Secondary | ICD-10-CM

## 2022-06-12 DIAGNOSIS — Z853 Personal history of malignant neoplasm of breast: Secondary | ICD-10-CM | POA: Diagnosis not present

## 2022-06-12 DIAGNOSIS — Z408 Encounter for other prophylactic surgery: Secondary | ICD-10-CM | POA: Diagnosis present

## 2022-06-12 DIAGNOSIS — C50211 Malignant neoplasm of upper-inner quadrant of right female breast: Secondary | ICD-10-CM

## 2022-06-12 DIAGNOSIS — N838 Other noninflammatory disorders of ovary, fallopian tube and broad ligament: Secondary | ICD-10-CM | POA: Insufficient documentation

## 2022-06-12 DIAGNOSIS — Z1501 Genetic susceptibility to malignant neoplasm of breast: Secondary | ICD-10-CM

## 2022-06-12 DIAGNOSIS — Z803 Family history of malignant neoplasm of breast: Secondary | ICD-10-CM | POA: Diagnosis not present

## 2022-06-12 DIAGNOSIS — Z923 Personal history of irradiation: Secondary | ICD-10-CM | POA: Diagnosis not present

## 2022-06-12 HISTORY — DX: Personal history of irradiation: Z92.3

## 2022-06-12 HISTORY — DX: Personal history of antineoplastic chemotherapy: Z92.21

## 2022-06-12 HISTORY — PX: LAPAROSCOPIC BILATERAL SALPINGO OOPHERECTOMY: SHX5890

## 2022-06-12 HISTORY — DX: Presence of spectacles and contact lenses: Z97.3

## 2022-06-12 LAB — CBC
HCT: 43.4 % (ref 36.0–46.0)
Hemoglobin: 14.6 g/dL (ref 12.0–15.0)
MCH: 30.9 pg (ref 26.0–34.0)
MCHC: 33.6 g/dL (ref 30.0–36.0)
MCV: 91.9 fL (ref 80.0–100.0)
Platelets: 201 10*3/uL (ref 150–400)
RBC: 4.72 MIL/uL (ref 3.87–5.11)
RDW: 13.1 % (ref 11.5–15.5)
WBC: 3.9 10*3/uL — ABNORMAL LOW (ref 4.0–10.5)
nRBC: 0 % (ref 0.0–0.2)

## 2022-06-12 LAB — TYPE AND SCREEN
ABO/RH(D): O POS
Antibody Screen: NEGATIVE

## 2022-06-12 LAB — POCT PREGNANCY, URINE: Preg Test, Ur: NEGATIVE

## 2022-06-12 SURGERY — SALPINGO-OOPHORECTOMY, BILATERAL, LAPAROSCOPIC
Anesthesia: General | Laterality: Bilateral

## 2022-06-12 MED ORDER — SUGAMMADEX SODIUM 200 MG/2ML IV SOLN
INTRAVENOUS | Status: DC | PRN
  Administered 2022-06-12: 130 mg via INTRAVENOUS

## 2022-06-12 MED ORDER — KETOROLAC TROMETHAMINE 30 MG/ML IJ SOLN: 30.0000 mg | Freq: Once | INTRAMUSCULAR | Status: DC

## 2022-06-12 MED ORDER — ONDANSETRON HCL 4 MG/2ML IJ SOLN
INTRAMUSCULAR | Status: DC | PRN
  Administered 2022-06-12: 4 mg via INTRAVENOUS

## 2022-06-12 MED ORDER — LACTATED RINGERS IV SOLN
INTRAVENOUS | Status: DC
  Administered 2022-06-12: 1000 mL via INTRAVENOUS

## 2022-06-12 MED ORDER — OXYCODONE HCL 5 MG/5ML PO SOLN: 5.0000 mg | Freq: Once | ORAL | Status: DC | PRN

## 2022-06-12 MED ORDER — ONDANSETRON HCL 4 MG/2ML IJ SOLN
INTRAMUSCULAR | Status: AC
  Filled 2022-06-12: qty 2

## 2022-06-12 MED ORDER — SCOPOLAMINE 1 MG/3DAYS TD PT72
MEDICATED_PATCH | TRANSDERMAL | Status: AC
  Filled 2022-06-12: qty 1

## 2022-06-12 MED ORDER — BUPIVACAINE HCL (PF) 0.25 % IJ SOLN
INTRAMUSCULAR | Status: DC | PRN
  Administered 2022-06-12: 9 mL

## 2022-06-12 MED ORDER — FENTANYL CITRATE (PF) 100 MCG/2ML IJ SOLN
INTRAMUSCULAR | Status: DC | PRN
  Administered 2022-06-12: 100 ug via INTRAVENOUS

## 2022-06-12 MED ORDER — PROPOFOL 10 MG/ML IV BOLUS
INTRAVENOUS | Status: AC
  Filled 2022-06-12: qty 20

## 2022-06-12 MED ORDER — DEXAMETHASONE SODIUM PHOSPHATE 10 MG/ML IJ SOLN
INTRAMUSCULAR | Status: DC | PRN
  Administered 2022-06-12: 10 mg via INTRAVENOUS

## 2022-06-12 MED ORDER — MIDAZOLAM HCL 2 MG/2ML IJ SOLN
INTRAMUSCULAR | Status: AC
  Filled 2022-06-12: qty 2

## 2022-06-12 MED ORDER — SCOPOLAMINE 1 MG/3DAYS TD PT72
1.0000 | MEDICATED_PATCH | TRANSDERMAL | Status: DC
  Administered 2022-06-12: 1.5 mg via TRANSDERMAL

## 2022-06-12 MED ORDER — MIDAZOLAM HCL 5 MG/5ML IJ SOLN
INTRAMUSCULAR | Status: DC | PRN
  Administered 2022-06-12: 2 mg via INTRAVENOUS

## 2022-06-12 MED ORDER — IBUPROFEN 600 MG PO TABS: 600.0000 mg | ORAL_TABLET | Freq: Four times a day (QID) | ORAL | 0 refills | Status: DC | PRN

## 2022-06-12 MED ORDER — PROPOFOL 10 MG/ML IV BOLUS
INTRAVENOUS | Status: DC | PRN
  Administered 2022-06-12: 125 mg via INTRAVENOUS

## 2022-06-12 MED ORDER — ACETAMINOPHEN 500 MG PO TABS
1000.0000 mg | ORAL_TABLET | ORAL | Status: AC
  Administered 2022-06-12: 1000 mg via ORAL

## 2022-06-12 MED ORDER — LACTATED RINGERS IV SOLN: INTRAVENOUS | Status: DC

## 2022-06-12 MED ORDER — ACETAMINOPHEN 500 MG PO TABS
ORAL_TABLET | ORAL | Status: AC
  Filled 2022-06-12: qty 2

## 2022-06-12 MED ORDER — FENTANYL CITRATE (PF) 100 MCG/2ML IJ SOLN
INTRAMUSCULAR | Status: AC
  Filled 2022-06-12: qty 2

## 2022-06-12 MED ORDER — GABAPENTIN 300 MG PO CAPS
ORAL_CAPSULE | ORAL | Status: AC
  Filled 2022-06-12: qty 1

## 2022-06-12 MED ORDER — OXYCODONE HCL 5 MG PO TABS: 5.0000 mg | ORAL_TABLET | Freq: Once | ORAL | Status: DC | PRN

## 2022-06-12 MED ORDER — AMISULPRIDE (ANTIEMETIC) 5 MG/2ML IV SOLN: 10.0000 mg | Freq: Once | INTRAVENOUS | Status: DC | PRN

## 2022-06-12 MED ORDER — SODIUM CHLORIDE 0.9 % IR SOLN
Status: DC | PRN
  Administered 2022-06-12: 500 mL

## 2022-06-12 MED ORDER — HYDROMORPHONE HCL 1 MG/ML IJ SOLN: 0.2500 mg | INTRAMUSCULAR | Status: DC | PRN

## 2022-06-12 MED ORDER — LIDOCAINE 2% (20 MG/ML) 5 ML SYRINGE
INTRAMUSCULAR | Status: DC | PRN
  Administered 2022-06-12: 60 mg via INTRAVENOUS

## 2022-06-12 MED ORDER — ROCURONIUM BROMIDE 10 MG/ML (PF) SYRINGE
PREFILLED_SYRINGE | INTRAVENOUS | Status: DC | PRN
  Administered 2022-06-12: 50 mg via INTRAVENOUS

## 2022-06-12 MED ORDER — DEXAMETHASONE SODIUM PHOSPHATE 10 MG/ML IJ SOLN
INTRAMUSCULAR | Status: AC
  Filled 2022-06-12: qty 1

## 2022-06-12 MED ORDER — LIDOCAINE HCL (PF) 2 % IJ SOLN
INTRAMUSCULAR | Status: AC
  Filled 2022-06-12: qty 5

## 2022-06-12 MED ORDER — GABAPENTIN 300 MG PO CAPS
300.0000 mg | ORAL_CAPSULE | Freq: Once | ORAL | Status: AC
Start: 2022-06-12 — End: 2022-06-12
  Administered 2022-06-12: 300 mg via ORAL

## 2022-06-12 MED ORDER — KETOROLAC TROMETHAMINE 30 MG/ML IJ SOLN
INTRAMUSCULAR | Status: DC | PRN
  Administered 2022-06-12: 30 mg via INTRAVENOUS

## 2022-06-12 MED ORDER — OXYCODONE-ACETAMINOPHEN 5-325 MG PO TABS
1.0000 | ORAL_TABLET | Freq: Four times a day (QID) | ORAL | 0 refills | Status: AC | PRN
Start: 2022-06-12 — End: 2022-06-17

## 2022-06-12 MED ORDER — ROCURONIUM BROMIDE 10 MG/ML (PF) SYRINGE
PREFILLED_SYRINGE | INTRAVENOUS | Status: AC
  Filled 2022-06-12: qty 10

## 2022-06-12 SURGICAL SUPPLY — 34 items
ADH SKN CLS APL DERMABOND .7 (GAUZE/BANDAGES/DRESSINGS) ×1
CABLE HIGH FREQUENCY MONO STRZ (ELECTRODE) IMPLANT
CATH ROBINSON RED A/P 16FR (CATHETERS) IMPLANT
COVER MAYO STAND STRL (DRAPES) ×2 IMPLANT
DECANTER SPIKE VIAL GLASS SM (MISCELLANEOUS) ×2 IMPLANT
DERMABOND ADVANCED (GAUZE/BANDAGES/DRESSINGS) ×1
DERMABOND ADVANCED .7 DNX12 (GAUZE/BANDAGES/DRESSINGS) ×1 IMPLANT
DRSG OPSITE POSTOP 3X4 (GAUZE/BANDAGES/DRESSINGS) IMPLANT
DURAPREP 26ML APPLICATOR (WOUND CARE) ×2 IMPLANT
GAUZE 4X4 16PLY ~~LOC~~+RFID DBL (SPONGE) ×3 IMPLANT
GLOVE BIO SURGEON STRL SZ 6.5 (GLOVE) ×4 IMPLANT
GOWN STRL REUS W/TWL LRG LVL3 (GOWN DISPOSABLE) ×6 IMPLANT
KIT TURNOVER CYSTO (KITS) ×2 IMPLANT
MANIPULATOR UTERINE 7CM CLEARV (MISCELLANEOUS) ×2 IMPLANT
NS IRRIG 1000ML POUR BTL (IV SOLUTION) ×2 IMPLANT
PACK LAPAROSCOPY BASIN (CUSTOM PROCEDURE TRAY) ×2 IMPLANT
PACK TRENDGUARD 450 HYBRID PRO (MISCELLANEOUS) ×1 IMPLANT
PROTECTOR NERVE ULNAR (MISCELLANEOUS) ×4 IMPLANT
SET SUCTION IRRIG HYDROSURG (IRRIGATION / IRRIGATOR) IMPLANT
SET TUBE SMOKE EVAC HIGH FLOW (TUBING) ×2 IMPLANT
SHEARS HARMONIC ACE PLUS 36CM (ENDOMECHANICALS) IMPLANT
SLEEVE XCEL OPT CAN 5 100 (ENDOMECHANICALS) ×2 IMPLANT
SOLUTION ELECTROLUBE (MISCELLANEOUS) IMPLANT
SUT VIC AB 3-0 PS2 18 (SUTURE) ×2
SUT VIC AB 3-0 PS2 18XBRD (SUTURE) ×1 IMPLANT
SUT VICRYL 0 UR6 27IN ABS (SUTURE) IMPLANT
SYS BAG RETRIEVAL 10MM (BASKET) ×2
SYSTEM BAG RETRIEVAL 10MM (BASKET) IMPLANT
SYSTEM CARTER THOMASON II (TROCAR) IMPLANT
TOWEL OR 17X26 10 PK STRL BLUE (TOWEL DISPOSABLE) ×4 IMPLANT
TRENDGUARD 450 HYBRID PRO PACK (MISCELLANEOUS) ×2
TROCAR BLADELESS OPT 5 100 (ENDOMECHANICALS) ×4 IMPLANT
TROCAR XCEL NON-BLD 11X100MML (ENDOMECHANICALS) ×1 IMPLANT
WARMER LAPAROSCOPE (MISCELLANEOUS) ×2 IMPLANT

## 2022-06-12 NOTE — Anesthesia Postprocedure Evaluation (Signed)
Anesthesia Post Note  Patient: Chelsea Wise  Procedure(s) Performed: LAPAROSCOPIC BILATERAL SALPINGO OOPHORECTOMY (Bilateral)     Patient location during evaluation: PACU Anesthesia Type: General Level of consciousness: sedated and patient cooperative Pain management: pain level controlled Vital Signs Assessment: post-procedure vital signs reviewed and stable Respiratory status: spontaneous breathing Cardiovascular status: stable Anesthetic complications: no   No notable events documented.  Last Vitals:  Vitals:   06/12/22 1030 06/12/22 1040  BP: 138/83   Pulse: 71 (!) 59  Resp: (!) 25 15  Temp:    SpO2: 100% 100%    Last Pain:  Vitals:   06/12/22 1040  TempSrc:   PainSc: 0-No pain                 Nolon Nations

## 2022-06-12 NOTE — Discharge Instructions (Addendum)
Call office with any concerns [phone] DISCHARGE INSTRUCTIONS: Laparoscopy  The following instructions have been prepared to help you care for yourself upon your return home today.  Wound care:  Do not get the incision wet for the first 24 hours. The incision should be kept clean and dry.  The Band-Aids or dressings may be removed the day after surgery.  Should the incision become sore, red, and swollen after the first week, check with your doctor.  Personal hygiene:  Shower the day after your procedure.  Activity and limitations:  Do NOT drive or operate any equipment today.  Do NOT lift anything more than 15 pounds for 2-3 weeks after surgery.  Do NOT rest in bed all day.  Walking is encouraged. Walk each day, starting slowly with 5-minute walks 3 or 4 times a day. Slowly increase the length of your walks.  Walk up and down stairs slowly.  Do NOT do strenuous activities, such as golfing, playing tennis, bowling, running, biking, weight lifting, gardening, mowing, or vacuuming for 2-4 weeks. Ask your doctor when it is okay to start.  Diet: Eat a light meal as desired this evening. You may resume your usual diet tomorrow.  Return to work: This is dependent on the type of work you do. For the most part you can return to a desk job within a week of surgery. If you are more active at work, please discuss this with your doctor.  What to expect after your surgery: You may have a slight burning sensation when you urinate on the first day. You may have a very small amount of blood in the urine. Expect to have a small amount of vaginal discharge/light bleeding for 1-2 weeks. It is not unusual to have abdominal soreness and bruising for up to 2 weeks. You may be tired and need more rest for about 1 week. You may experience shoulder pain for 24-72 hours. Lying flat in bed may relieve it.  Call your doctor for any of the following:  Develop a fever of 100.4 or greater  Inability to urinate 6  hours after discharge from hospital  Severe pain not relieved by pain medications  Persistent of heavy bleeding at incision site  Redness or swelling around incision site after a week  Increasing nausea or vomiting  Patient Signature________________________________________ Nurse Signature_________________________________________  Post Anesthesia Home Care Instructions  Activity: Get plenty of rest for the remainder of the day. A responsible adult should stay with you for 24 hours following the procedure.  For the next 24 hours, DO NOT: -Drive a car -Operate machinery -Drink alcoholic beverages -Take any medication unless instructed by your physician -Make any legal decisions or sign important papers.  Meals: Start with liquid foods such as gelatin or soup. Progress to regular foods as tolerated. Avoid greasy, spicy, heavy foods. If nausea and/or vomiting occur, drink only clear liquids until the nausea and/or vomiting subsides. Call your physician if vomiting continues.  Special Instructions/Symptoms: Your throat may feel dry or sore from the anesthesia or the breathing tube placed in your throat during surgery. If this causes discomfort, gargle with warm salt water. The discomfort should disappear within 24 hours.  If you had a scopolamine patch placed behind your ear for the management of post- operative nausea and/or vomiting:  1. The medication in the patch is effective for 72 hours, after which it should be removed.  Wrap patch in a tissue and discard in the trash. Wash hands thoroughly with   soap and water. 2. You may remove the patch earlier than 72 hours if you experience unpleasant side effects which may include dry mouth, dizziness or visual disturbances. 3. Avoid touching the patch. Wash your hands with soap and water after contact with the patch.    

## 2022-06-12 NOTE — Interval H&P Note (Signed)
History and Physical Interval Note: Pt seen. No change since H/P done Confirmed consent to procedure  Reviewed r/b To OR when ready   06/12/2022 8:32 AM  Chelsea Wise Anes  has presented today for surgery, with the diagnosis of history of malignant neoplasm of breast.  The various methods of treatment have been discussed with the patient and family. After consideration of risks, benefits and other options for treatment, the patient has consented to  Procedure(s): LAPAROSCOPIC BILATERAL SALPINGO OOPHORECTOMY (Bilateral) as a surgical intervention.  The patient's history has been reviewed, patient examined, no change in status, stable for surgery.  I have reviewed the patient's chart and labs.  Questions were answered to the patient's satisfaction.     Isaiah Serge

## 2022-06-12 NOTE — Transfer of Care (Signed)
Immediate Anesthesia Transfer of Care Note  Patient: Chelsea Wise  Procedure(s) Performed: LAPAROSCOPIC BILATERAL SALPINGO OOPHORECTOMY (Bilateral)  Patient Location: PACU  Anesthesia Type:General  Level of Consciousness: drowsy  Airway & Oxygen Therapy: Patient Spontanous Breathing and Patient connected to nasal cannula oxygen  Post-op Assessment: Report given to RN and Post -op Vital signs reviewed and stable  Post vital signs: Reviewed and stable  Last Vitals:  Vitals Value Taken Time  BP 131/81   Temp    Pulse 71 06/12/22 1010  Resp 17 06/12/22 1010  SpO2 100 % 06/12/22 1010  Vitals shown include unvalidated device data.  Last Pain:  Vitals:   06/12/22 0727  TempSrc:   PainSc: 0-No pain      Patients Stated Pain Goal: 7 (95/58/31 6742)  Complications: No notable events documented.

## 2022-06-12 NOTE — Op Note (Addendum)
Operative Note    Preoperative Diagnosis History of breast cancer  BRCA neg but ER +   Postoperative Diagnosis: Same    Procedure: Laparoscopic bilateral salpingo-oopherectomy   Surgeon: Mickle Mallory DO Assist: Bovard-Stuckert, MD  Anesthesia: General   Fluids: LR 1L EBL: 76ml UOP: 11ml   Findings: Grossly normal uterus, tubes and ovaries    Specimen: Bilateral fallopian tubes and ovarias   Procedure Note . Consent reviewed and confirmed in periop area.  Patient was taken to the operating room where she was comfortably placed in the dorsal lithotomy position with her arms at her sides.  General anesthesia was administered without complications. She was then prepped and draped in the normal sterile fashion. An appropriate timeout was performed.  The bladder was emptied with a catheter. A speculum was placed vaginally and cervix identified. A single toothed tenaculum was used to grasp the anterior lip of the cervix. A ClearVue uterine manipulator was placed.  Legs were lowered and attention was then turned to the patient's abdomen.  A 5 mm infraumbilical incision was made after 0.25% marcaine injected and the optiview trocar and camera easily introduced into the abdominal cavity.Gas flow was then applied and a pneumoperitoneum obtained with approximate 3 L of CO2 gas. A second and third 62mmt trocar were placed in the patients right,then left, lower uterine segment under visualization.    With patient in trendelenburg position,  the pelvis was surveyed. Grossly normal uterus, tubes and ovaries were noted.   The patient was then flattened and the umbilical incision closed with an 0-vicryl suture on a curved needle.   The remaining trocars were then removed after gas allowed to escape and extra breaths administered by anesthesia.  The skin incisions were closed with 4-0 vicryl and dermabond  The ClearVue manipulator was removed. Hemostasis noted   Patient was then awakened and  taken to the recovery room in stable condition. Counts were all confirmed accurate

## 2022-06-12 NOTE — Anesthesia Procedure Notes (Signed)
Procedure Name: Intubation Date/Time: 06/12/2022 9:03 AM  Performed by: Bonney Aid, CRNAPre-anesthesia Checklist: Patient identified, Emergency Drugs available, Suction available and Patient being monitored Patient Re-evaluated:Patient Re-evaluated prior to induction Oxygen Delivery Method: Circle system utilized Preoxygenation: Pre-oxygenation with 100% oxygen Induction Type: IV induction Ventilation: Mask ventilation without difficulty Laryngoscope Size: Mac and 3 Grade View: Grade I Tube type: Oral Tube size: 7.0 mm Number of attempts: 1 Airway Equipment and Method: Stylet Placement Confirmation: ETT inserted through vocal cords under direct vision, positive ETCO2 and breath sounds checked- equal and bilateral Secured at: 21 cm Tube secured with: Tape Dental Injury: Teeth and Oropharynx as per pre-operative assessment

## 2022-06-12 NOTE — Anesthesia Preprocedure Evaluation (Addendum)
Anesthesia Evaluation  Patient identified by MRN, date of birth, ID band Patient awake    Reviewed: Allergy & Precautions, NPO status , Patient's Chart, lab work & pertinent test results  Airway Mallampati: I  TM Distance: >3 FB Neck ROM: Full    Dental  (+) Dental Advisory Given, Missing   Pulmonary    Pulmonary exam normal breath sounds clear to auscultation       Cardiovascular  Rhythm:Regular Rate:Bradycardia  Echo:  1. Left ventricular ejection fraction by 3D volume is 63 %. The left  ventricle has normal function. The left ventricle has no regional wall  motion abnormalities. Left ventricular diastolic parameters were normal.  The average left ventricular global  longitudinal strain is -22.5 %. The global longitudinal strain is normal.  2. Right ventricular systolic function is normal. The right ventricular  size is normal. There is normal pulmonary artery systolic pressure. The  estimated right ventricular systolic pressure is 39.0 mmHg.  3. The mitral valve is grossly normal. Trivial mitral valve  regurgitation. No evidence of mitral stenosis.  4. The aortic valve is tricuspid. Aortic valve regurgitation is not  visualized. No aortic stenosis is present.  5. The inferior vena cava is normal in size with greater than 50%  respiratory variability, suggesting right atrial pressure of 3 mmHg.   Neuro/Psych    GI/Hepatic negative GI ROS, Neg liver ROS,   Endo/Other    Renal/GU      Musculoskeletal   Abdominal   Peds  Hematology   Anesthesia Other Findings   Reproductive/Obstetrics                            Anesthesia Physical  Anesthesia Plan  ASA: 2  Anesthesia Plan: General   Post-op Pain Management: Tylenol PO (pre-op)*, Toradol IV (intra-op)* and Gabapentin PO (pre-op)*   Induction:   PONV Risk Score and Plan: 4 or greater and Ondansetron, Dexamethasone, Midazolam,  Scopolamine patch - Pre-op and Treatment may vary due to age or medical condition  Airway Management Planned: Oral ETT  Additional Equipment: None  Intra-op Plan:   Post-operative Plan: Extubation in OR  Informed Consent: I have reviewed the patients History and Physical, chart, labs and discussed the procedure including the risks, benefits and alternatives for the proposed anesthesia with the patient or authorized representative who has indicated his/her understanding and acceptance.     Dental advisory given  Plan Discussed with: CRNA  Anesthesia Plan Comments:        Anesthesia Quick Evaluation

## 2022-06-13 ENCOUNTER — Encounter (HOSPITAL_BASED_OUTPATIENT_CLINIC_OR_DEPARTMENT_OTHER): Payer: Self-pay | Admitting: Obstetrics and Gynecology

## 2022-06-13 LAB — SURGICAL PATHOLOGY

## 2022-06-26 NOTE — Progress Notes (Signed)
Patient Care Team: Sherlyn Hay, DO as PCP - General (Obstetrics and Gynecology) Eppie Gibson, MD as Consulting Physician (Radiation Oncology) Rolm Bookbinder, MD as Consulting Physician (General Surgery) Nicholas Lose, MD as Consulting Physician (Hematology and Oncology)  DIAGNOSIS: No diagnosis found.  SUMMARY OF ONCOLOGIC HISTORY: Oncology History  Malignant neoplasm of upper-inner quadrant of right breast in female, estrogen receptor positive ([place])  04/08/2021 Initial Diagnosis   Palpable right breast mass: Mammogram and US showed a 4.5cm right breast mass at the 12-1 o'clock position with associated microcalcifications, multiple additional masses from the 9-11 o'clock position in the right breast, calcifications in the subareolar right breast, 1.4cm, and two enlarged right axillary lymph nodes. Biopsy showed invasive and in situ ductal carcinoma in the breast and axilla, grade 2 ER 80%, PR 1%, Ki-67 25%, HER2 equivalent by [facility]   04/08/2021 Cancer Staging   Staging form: Breast, AJCC 8th Edition - Clinical stage from 04/08/2021: Stage IIA (cT2, cN1, cM0, G2, ER+, PR+, HER2-) - Signed by Nicholas Lose, MD on 04/13/2021 Stage prefix: Initial diagnosis Histologic grading system: 3 grade system   04/19/2021 Genetic Testing   Negative genetic testing:  No pathogenic variants detected on the [employer] BRCAplus or CancerNext-Expanded + RNAinsight panels. Two variants of uncertain significance (VUS) were detected - one in the APC gene called p.T2422I (c.7265C>T) and a second in the BAP1 gene called p.P302L (c.905C>T). The report dates are 04/19/2021 and 04/25/2021, respectively.  The BRCAplus panel offered by [employer] and includes sequencing and deletion/duplication analysis for the following 8 genes: ATM, BRCA1, BRCA2, CDH1, CHEK2, PALB2, PTEN, and TP53. The CancerNext-Expanded + RNAinsight gene panel offered by [employer] and includes sequencing and rearrangement analysis for the  following 77 genes: AIP, ALK, APC, ATM, AXIN2, BAP1, BARD1, BLM, BMPR1A, BRCA1, BRCA2, BRIP1, CDC73, CDH1, CDK4, CDKN1B, CDKN2A, CHEK2, CTNNA1, DICER1, FANCC, FH, FLCN, GALNT12, KIF1B, LZTR1, MAX, MEN1, MET, MLH1, MSH2, MSH3, MSH6, MUTYH, NBN, NF1, NF2, NTHL1, PALB2, PHOX2B, PMS2, POT1, PRKAR1A, PTCH1, PTEN, RAD51C, RAD51D, RB1, RECQL, RET, SDHA, SDHAF2, SDHB, SDHC, SDHD, SMAD4, SMARCA4, SMARCB1, SMARCE1, STK11, SUFU, TMEM127, TP53, TSC1, TSC2, VHL and XRCC2 (sequencing and deletion/duplication); EGFR, EGLN1, HOXB13, KIT, MITF, PDGFRA, POLD1 and POLE (sequencing only); EPCAM and GREM1 (deletion/duplication only). RNA data is routinely analyzed for use in variant interpretation for all genes.    05/10/2021 - 09/20/2021 Chemotherapy   Patient is on Treatment Plan : BREAST ADJUVANT DOSE DENSE AC q14d / PACLitaxel q7d     10/21/2021 Surgery   Bilateral mastectomies: Left breast benign.  Right breast showed 6 cm of invasive ductal carcinoma, grade 2, margins negative, 2 out of 7 lymph nodes were positive for macrometastases.  ER positive, PR negative, HER2 negative.   10/21/2021 Cancer Staging   Staging form: Breast, AJCC 8th Edition - Pathologic stage from 10/21/2021: No Stage Recommended (ypT3, pN1a, cM0, G2, ER+, PR-, HER2-) - Signed by Gardenia Phlegm, NP on 04/03/2022 Stage prefix: Post-therapy Histologic grading system: 3 grade system   11/30/2021 - 01/11/2022 Radiation Therapy   Site Technique Total Dose (Gy) Dose per Fx (Gy) Completed Fx Beam Energies  Chest Wall, Right: CW_R_IMN 3D 50/50 2 25/25 6X, 10X  Chest Wall, Right: CW_R_PAB_SCV 3D 50/50 2 25/25 6X, 10X  Chest Wall, Right: CW_R_Bst Electron 10/10 2 5/5 6E       CHIEF COMPLIANT: Post surgery follow-up.  INTERVAL HISTORY: Chelsea Wise is a  41 y.o. with above-mentioned history of right breast cancer having completed neoadjuvant chemotherapy  with dose dense Adriamycin and Cytoxan, and chemotherapy with Taxol. She reports to  the clinic today for follow-up.    ALLERGIES:  has No Known Allergies.  MEDICATIONS:  Current Outpatient Medications  Medication Sig Dispense Refill   acetaminophen (TYLENOL) 500 MG tablet Take 500-1,000 mg by mouth every 6 (six) hours as needed for moderate pain.     Ascorbic Acid (VITAMIN C PO) Take 1 tablet by mouth daily.     cetirizine (ZYRTEC) 10 MG tablet Take 10 mg by mouth daily as needed for allergies.     doxycycline (VIBRAMYCIN) 100 MG capsule Take 100 mg by mouth at bedtime.     ELDERBERRY PO Take 1 capsule by mouth daily. (Patient not taking: Reported on 04/03/2022)     ibuprofen (ADVIL) 600 MG tablet Take 1 tablet (600 mg total) by mouth every 6 (six) hours as needed for moderate pain or cramping. 20 tablet 0   loteprednol (LOTEMAX) 0.5 % ophthalmic suspension 3 drops 3 (three) times daily.     Multiple Vitamin (MULTIVITAMIN WITH MINERALS) TABS tablet Take 2 tablets by mouth daily.     tamoxifen (NOLVADEX) 20 MG tablet Take 1 tablet (20 mg total) by mouth daily. 90 tablet 0   No current facility-administered medications for this visit.    PHYSICAL EXAMINATION: ECOG PERFORMANCE STATUS: {CHL ONC ECOG [phone]}  There were no vitals filed for this visit. There were no vitals filed for this visit.  BREAST:*** No palpable masses or nodules in either right or left breasts. No palpable axillary supraclavicular or infraclavicular adenopathy no breast tenderness or nipple discharge. (exam performed in the presence of a chaperone)  LABORATORY DATA:  I have reviewed the data as listed    Latest Ref Rng & Units 09/20/2021   11:23 AM 09/13/2021    1:08 PM 09/06/2021   10:55 AM  CMP  Glucose 70 - 99 mg/dL 93  83  85   BUN 6 - 20 mg/dL $Remove'8  10  7   'msvNDYu$ Creatinine 0.44 - 1.00 mg/dL 0.70  0.70  0.67   Sodium 135 - 145 mmol/L 141  138  140   Potassium 3.5 - 5.1 mmol/L 3.8  3.7  3.8   Chloride 98 - 111 mmol/L 107  104  106   CO2 22 - 32 mmol/L $RemoveB'24  24  25   'WeRACysF$ Calcium 8.9 - 10.3 mg/dL  9.5  9.3  9.5   Total Protein 6.5 - 8.1 g/dL 7.2  7.1  7.1   Total Bilirubin 0.3 - 1.2 mg/dL 0.3  0.3  0.3   Alkaline Phos 38 - 126 U/L 48  50  50   AST 15 - 41 U/L $Remo'19  17  15   'FtJST$ ALT 0 - 44 U/L $Remo'13  17  14     'ZGsRf$ Lab Results  Component Value Date   WBC 3.9 (L) 06/12/2022   HGB 14.6 06/12/2022   HCT 43.4 06/12/2022   MCV 91.9 06/12/2022   PLT 201 06/12/2022   NEUTROABS 3.5 09/20/2021    ASSESSMENT & PLAN:  No problem-specific Assessment & Plan notes found for this encounter.    No orders of the defined types were placed in this encounter.  The patient has a good understanding of the overall plan. she agrees with it. she will call with any problems that may develop before the next visit here. Total time spent: 30 mins including face to face time and time spent for planning, charting and co-ordination of care  [place], CMA 06/26/22    I Gardiner Coins am scribing for Dr. Lindi Adie  ***

## 2022-06-27 ENCOUNTER — Inpatient Hospital Stay: Payer: Federal, State, Local not specified - PPO | Attending: Adult Health | Admitting: Hematology and Oncology

## 2022-06-27 ENCOUNTER — Other Ambulatory Visit: Payer: Self-pay

## 2022-06-27 DIAGNOSIS — Z170 Estrogen receptor positive status [ER+]: Secondary | ICD-10-CM | POA: Diagnosis not present

## 2022-06-27 DIAGNOSIS — Z9013 Acquired absence of bilateral breasts and nipples: Secondary | ICD-10-CM | POA: Diagnosis not present

## 2022-06-27 DIAGNOSIS — Z90722 Acquired absence of ovaries, bilateral: Secondary | ICD-10-CM | POA: Insufficient documentation

## 2022-06-27 DIAGNOSIS — C50411 Malignant neoplasm of upper-outer quadrant of right female breast: Secondary | ICD-10-CM | POA: Diagnosis present

## 2022-06-27 DIAGNOSIS — C50211 Malignant neoplasm of upper-inner quadrant of right female breast: Secondary | ICD-10-CM | POA: Insufficient documentation

## 2022-06-27 DIAGNOSIS — Z79811 Long term (current) use of aromatase inhibitors: Secondary | ICD-10-CM | POA: Insufficient documentation

## 2022-06-27 MED ORDER — LETROZOLE 2.5 MG PO TABS: 2.5000 mg | ORAL_TABLET | Freq: Every day | ORAL | 3 refills | Status: DC

## 2022-06-27 NOTE — Assessment & Plan Note (Addendum)
04/08/2021:Palpable right breast mass: Mammogram and US showed a 4.5cm right breast mass at the 12-1 o'clock position with associated microcalcifications, multiple additional masses from the 9-11 o'clock position in the right breast, calcifications in the subareolar right breast, 1.4cm, and two enlarged right axillary lymph nodes. 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 Neoadjuvant chemotherapy with dose dense Adriamycin and Cytoxan x4 followed by Taxol weekly x12completed 09/14/21 2.Bilateralmastectomieswith targeted node dissection 10/21/21 Left: Benign Right: 6 cm residual IDC 2/7 LN Positive ER: 80%, PR 1%, Her 2 Neg 3. Adjuvant radiation 12/01/2021-01/11/2022 4. Followed by adjuvant antiestrogen therapy tamoxifen (complete OFS was recommended but she decided to go with tamoxifen) -------------------------------------------------------------------------------------------------------------------- Current treatment: Tamoxifen started 02/01/2022 stopped it (had oophorectomy).  Started on letrozole 06/27/2022  Breast cancer surveillance: 1.  Breast exam 06/27/2022: Benign 2. mammogram: No role of imaging since she had bilateral mastectomies  Return to clinic in 6 weeks to assess tolerance to letrozole therapy.

## 2022-07-02 ENCOUNTER — Other Ambulatory Visit: Payer: Self-pay | Admitting: Adult Health

## 2022-07-02 DIAGNOSIS — C50211 Malignant neoplasm of upper-inner quadrant of right female breast: Secondary | ICD-10-CM

## 2022-07-03 ENCOUNTER — Other Ambulatory Visit: Payer: Self-pay | Admitting: Hematology and Oncology

## 2022-07-04 ENCOUNTER — Telehealth: Payer: Self-pay | Admitting: Hematology and Oncology

## 2022-07-04 NOTE — Telephone Encounter (Signed)
Scheduled appointment per 7/25 los. Patient is aware.

## 2022-07-24 ENCOUNTER — Ambulatory Visit: Payer: Federal, State, Local not specified - PPO | Attending: General Surgery

## 2022-07-24 ENCOUNTER — Telehealth: Payer: Self-pay | Admitting: Hematology and Oncology

## 2022-07-24 VITALS — Wt 140.4 lb

## 2022-07-24 DIAGNOSIS — Z483 Aftercare following surgery for neoplasm: Secondary | ICD-10-CM | POA: Insufficient documentation

## 2022-07-24 NOTE — Telephone Encounter (Signed)
Rescheduled appointment per provider PAL. Patient is aware of the changes made to her upcoming appointment. 

## 2022-07-24 NOTE — Therapy (Signed)
OUTPATIENT PHYSICAL THERAPY SOZO SCREENING NOTE   Patient Name: Chelsea Wise MRN: [id number] DOB:1981/08/07, 41 y.o., female Today's Date: 07/24/2022  PCP: Sherlyn Hay, DO REFERRING PROVIDER: Rolm Bookbinder, MD   PT End of Session - 07/24/22 1013     Visit Number 10   # unchanged due to screen only   PT Start Time 1011    PT Stop Time 1015    PT Time Calculation (min) 4 min    Activity Tolerance Patient tolerated treatment well    Behavior During Therapy [facility] for tasks assessed/performed             Past Medical History:  Diagnosis Date   Family history of breast cancer    Family history of lung cancer    Family history of pancreatic cancer    Family history of prostate cancer    History of cancer chemotherapy    right breast cancer  05-10-2021  to 09-20-2021   History of external beam radiation therapy    right breast 12-01-2021 to 01-11-2022   Irritation of right eye 06/01/2022   Malignant neoplasm of upper-inner quadrant of right breast in female, estrogen receptor positive ([place]) 04/08/2021   oncologist--- dr Lindi Adie;  Stage IIA,  IDC/  DCIS;  completed chemo 09-20-2021 , completed radiation 01-11-2022   Wears contact lenses    Past Surgical History:  Procedure Laterality Date   LAPAROSCOPIC BILATERAL SALPINGO OOPHERECTOMY Bilateral 06/12/2022   Procedure: LAPAROSCOPIC BILATERAL SALPINGO OOPHORECTOMY;  Surgeon: Sherlyn Hay, DO;  Location: [place];  Service: Gynecology;  Laterality: Bilateral;   MASTECTOMY W/ SENTINEL NODE BIOPSY Right 10/21/2021   Procedure: RIGHT MASTECTOMY WITH RIGHT AXILLARY SENTINEL LYMPH NODE BIOPSY;  Surgeon: Rolm Bookbinder, MD;  Location: [place];  Service: General;  Laterality: Right;   PORT-A-CATH REMOVAL Left 10/21/2021   Procedure: REMOVAL PORT-A-CATH;  Surgeon: Rolm Bookbinder, MD;  Location: [place];  Service: General;  Laterality: Left;    PORTACATH PLACEMENT Left 05/09/2021   Procedure: INSERTION PORT-A-CATH;  Surgeon: Rolm Bookbinder, MD;  Location: [place];  Service: General;  Laterality: Left;   RADIOACTIVE SEED GUIDED AXILLARY SENTINEL LYMPH NODE Right 10/21/2021   Procedure: RADIOACTIVE SEED GUIDED RIGHT AXILLARY LYMPH NODE EXCISION;  Surgeon: Rolm Bookbinder, MD;  Location: [place];  Service: General;  Laterality: Right;   TOTAL MASTECTOMY Left 10/21/2021   Procedure: LEFT TOTAL MASTECTOMY;  Surgeon: Rolm Bookbinder, MD;  Location: [place];  Service: General;  Laterality: Left;   [place] EXTRACTION     Patient Active Problem List   Diagnosis Date Noted   S/P bilateral mastectomy 10/21/2021   Genetic testing 04/19/2021   Malignant neoplasm of upper-inner quadrant of right breast in female, estrogen receptor positive ([place]) 04/13/2021   Family history of prostate cancer    Family history of pancreatic cancer    Family history of breast cancer    Family history of lung cancer    Active labor 03/15/2016   Postpartum care following vaginal delivery 03/15/2016   SVD (spontaneous vaginal delivery) 01/18/2014   Labor and delivery, indication for care 01/17/2014   Antepartum bleeding, third trimester 01/01/2014   Placenta previa in second trimester 11/28/2013    REFERRING DIAG: right breast cancer at risk for lymphedema  THERAPY DIAG: Aftercare following surgery for neoplasm  PERTINENT HISTORY: Patient was diagnosed on 04/11/2021 with right grade II invasive ductal carcinoma breast cancer. It measures 4.5 cm and is located  in the upper inner quadrant. It is ER positive, weakly PR positive, and HER2 equivocal. Ki67 is 25%. She has 2 abnormal appearing axillary lymph nodes. One was biopsied and found to be positive.   PRECAUTIONS: right UE Lymphedema risk, None  SUBJECTIVE: Pt returns for her 3 month L-Dex screen.   PAIN:  Are you having pain? No  SOZO SCREENING: Patient  was assessed today using the SOZO machine to determine the lymphedema index score. This was compared to her baseline score. It was determined that she is within the recommended range when compared to her baseline and no further action is needed at this time. She will continue SOZO screenings. These are done every 3 months for 2 years post operatively followed by every 6 months for 2 years, and then annually.   L-DEX FLOWSHEETS - 07/24/22 1000       L-DEX LYMPHEDEMA SCREENING   Measurement Type Unilateral    L-DEX MEASUREMENT EXTREMITY Upper Extremity    POSITION  Standing    DOMINANT SIDE Right    At Risk Side Right    BASELINE SCORE (UNILATERAL) 1.6    L-DEX SCORE (UNILATERAL) 4.6    VALUE CHANGE (UNILAT) 3              Otelia Limes, PTA 07/24/2022, 10:17 AM

## 2022-08-11 ENCOUNTER — Ambulatory Visit: Payer: Federal, State, Local not specified - PPO | Admitting: Hematology and Oncology

## 2022-08-15 ENCOUNTER — Inpatient Hospital Stay: Payer: Federal, State, Local not specified - PPO | Attending: Adult Health | Admitting: Hematology and Oncology

## 2022-08-15 ENCOUNTER — Telehealth: Payer: Self-pay

## 2022-08-15 ENCOUNTER — Telehealth: Payer: Self-pay | Admitting: Pharmacy Technician

## 2022-08-15 ENCOUNTER — Other Ambulatory Visit (HOSPITAL_COMMUNITY): Payer: Self-pay

## 2022-08-15 ENCOUNTER — Other Ambulatory Visit: Payer: Self-pay

## 2022-08-15 DIAGNOSIS — Z9013 Acquired absence of bilateral breasts and nipples: Secondary | ICD-10-CM | POA: Insufficient documentation

## 2022-08-15 DIAGNOSIS — C50211 Malignant neoplasm of upper-inner quadrant of right female breast: Secondary | ICD-10-CM

## 2022-08-15 DIAGNOSIS — Z79811 Long term (current) use of aromatase inhibitors: Secondary | ICD-10-CM | POA: Diagnosis not present

## 2022-08-15 DIAGNOSIS — Z170 Estrogen receptor positive status [ER+]: Secondary | ICD-10-CM | POA: Diagnosis not present

## 2022-08-15 DIAGNOSIS — Z853 Personal history of malignant neoplasm of breast: Secondary | ICD-10-CM | POA: Insufficient documentation

## 2022-08-15 MED ORDER — ABEMACICLIB 100 MG PO TABS
100.0000 mg | ORAL_TABLET | Freq: Two times a day (BID) | ORAL | 0 refills | Status: DC
  Filled 2022-08-15: qty 70, 35d supply, fill #0
  Filled 2022-08-17: qty 56, 28d supply, fill #0

## 2022-08-15 NOTE — Progress Notes (Signed)
Patient Care Team: Sherlyn Hay, DO as PCP - General (Obstetrics and Gynecology) Eppie Gibson, MD as Consulting Physician (Radiation Oncology) Rolm Bookbinder, MD as Consulting Physician (General Surgery) Nicholas Lose, MD as Consulting Physician (Hematology and Oncology)  DIAGNOSIS:  Encounter Diagnosis  Name Primary?   Malignant neoplasm of upper-inner quadrant of right breast in female, estrogen receptor positive ([place])     SUMMARY OF ONCOLOGIC HISTORY: Oncology History  Malignant neoplasm of upper-inner quadrant of right breast in female, estrogen receptor positive ([place])  04/08/2021 Initial Diagnosis   Palpable right breast mass: Mammogram and US showed a 4.5cm right breast mass at the 12-1 o'clock position with associated microcalcifications, multiple additional masses from the 9-11 o'clock position in the right breast, calcifications in the subareolar right breast, 1.4cm, and two enlarged right axillary lymph nodes. Biopsy showed invasive and in situ ductal carcinoma in the breast and axilla, grade 2 ER 80%, PR 1%, Ki-67 25%, HER2 equivalent by [facility]   04/08/2021 Cancer Staging   Staging form: Breast, AJCC 8th Edition - Clinical stage from 04/08/2021: Stage IIA (cT2, cN1, cM0, G2, ER+, PR+, HER2-) - Signed by Nicholas Lose, MD on 04/13/2021 Stage prefix: Initial diagnosis Histologic grading system: 3 grade system   04/19/2021 Genetic Testing   Negative genetic testing:  No pathogenic variants detected on the [employer] BRCAplus or CancerNext-Expanded + RNAinsight panels. Two variants of uncertain significance (VUS) were detected - one in the APC gene called p.T2422I (c.7265C>T) and a second in the BAP1 gene called p.P302L (c.905C>T). The report dates are 04/19/2021 and 04/25/2021, respectively.  The BRCAplus panel offered by [employer] and includes sequencing and deletion/duplication analysis for the following 8 genes: ATM, BRCA1, BRCA2, CDH1, CHEK2, PALB2, PTEN, and TP53. The  CancerNext-Expanded + RNAinsight gene panel offered by [employer] and includes sequencing and rearrangement analysis for the following 77 genes: AIP, ALK, APC, ATM, AXIN2, BAP1, BARD1, BLM, BMPR1A, BRCA1, BRCA2, BRIP1, CDC73, CDH1, CDK4, CDKN1B, CDKN2A, CHEK2, CTNNA1, DICER1, FANCC, FH, FLCN, GALNT12, KIF1B, LZTR1, MAX, MEN1, MET, MLH1, MSH2, MSH3, MSH6, MUTYH, NBN, NF1, NF2, NTHL1, PALB2, PHOX2B, PMS2, POT1, PRKAR1A, PTCH1, PTEN, RAD51C, RAD51D, RB1, RECQL, RET, SDHA, SDHAF2, SDHB, SDHC, SDHD, SMAD4, SMARCA4, SMARCB1, SMARCE1, STK11, SUFU, TMEM127, TP53, TSC1, TSC2, VHL and XRCC2 (sequencing and deletion/duplication); EGFR, EGLN1, HOXB13, KIT, MITF, PDGFRA, POLD1 and POLE (sequencing only); EPCAM and GREM1 (deletion/duplication only). RNA data is routinely analyzed for use in variant interpretation for all genes.    05/10/2021 - 09/20/2021 Chemotherapy   Patient is on Treatment Plan : BREAST ADJUVANT DOSE DENSE AC q14d / PACLitaxel q7d     10/21/2021 Surgery   Bilateral mastectomies: Left breast benign.  Right breast showed 6 cm of invasive ductal carcinoma, grade 2, margins negative, 2 out of 7 lymph nodes were positive for macrometastases.  ER positive, PR negative, HER2 negative.   10/21/2021 Cancer Staging   Staging form: Breast, AJCC 8th Edition - Pathologic stage from 10/21/2021: No Stage Recommended (ypT3, pN1a, cM0, G2, ER+, PR-, HER2-) - Signed by Gardenia Phlegm, NP on 04/03/2022 Stage prefix: Post-therapy Histologic grading system: 3 grade system   11/30/2021 - 01/11/2022 Radiation Therapy   Site Technique Total Dose (Gy) Dose per Fx (Gy) Completed Fx Beam Energies  Chest Wall, Right: CW_R_IMN 3D 50/50 2 25/25 6X, 10X  Chest Wall, Right: CW_R_PAB_SCV 3D 50/50 2 25/25 6X, 10X  Chest Wall, Right: CW_R_Bst Electron 10/10 2 5/5 6E       CHIEF COMPLIANT: Follow-up on Letrozole  INTERVAL HISTORY: Chelsea Wise is a 41 y.o with the above mention. She presents to the clinic  today for a follow-up. She complains of back pain from when she sit for a while then tries to get up. Denies any other symptoms. Hot flashes are manageable. Does have some mood swings.  ALLERGIES:  has No Known Allergies.  MEDICATIONS:  Current Outpatient Medications  Medication Sig Dispense Refill   abemaciclib (VERZENIO) 100 MG tablet Take 1 tablet (100 mg total) by mouth 2 (two) times daily. 60 tablet 0   acetaminophen (TYLENOL) 500 MG tablet Take 500-1,000 mg by mouth every 6 (six) hours as needed for moderate pain.     Ascorbic Acid (VITAMIN C PO) Take 1 tablet by mouth daily.     cetirizine (ZYRTEC) 10 MG tablet Take 10 mg by mouth daily as needed for allergies.     ELDERBERRY PO Take 1 capsule by mouth daily.     ibuprofen (ADVIL) 600 MG tablet Take 1 tablet (600 mg total) by mouth every 6 (six) hours as needed for moderate pain or cramping. 20 tablet 0   letrozole (FEMARA) 2.5 MG tablet Take 1 tablet (2.5 mg total) by mouth daily. 90 tablet 3   loteprednol (LOTEMAX) 0.5 % ophthalmic suspension 3 drops 3 (three) times daily.     Multiple Vitamin (MULTIVITAMIN WITH MINERALS) TABS tablet Take 2 tablets by mouth daily.     No current facility-administered medications for this visit.    PHYSICAL EXAMINATION: ECOG PERFORMANCE STATUS: 1 - Symptomatic but completely ambulatory  Vitals:   08/15/22 1100  BP: 137/86  Pulse: 65  Resp: 17  Temp: 97.8 F (36.6 C)  SpO2: 100%   Filed Weights   08/15/22 1100  Weight: 140 lb 8 oz (63.7 kg)      LABORATORY DATA:  I have reviewed the data as listed    Latest Ref Rng & Units 09/20/2021   11:23 AM 09/13/2021    1:08 PM 09/06/2021   10:55 AM  CMP  Glucose 70 - 99 mg/dL 93  83  85   BUN 6 - 20 mg/dL _0 Creatinine 0.44 - 1.00 mg/dL 0.70  0.70  0.67   Sodium 135 - 145 mmol/L 141  138  140   Potassium 3.5 - 5.1 mmol/L 3.8  3.7  3.8   Chloride 98 - 111 mmol/L 107  104  106   CO2 22 - 32 mmol/L _1 Calcium 8.9 - 10.3  mg/dL 9.5  9.3  9.5   Total Protein 6.5 - 8.1 g/dL 7.2  7.1  7.1   Total Bilirubin 0.3 - 1.2 mg/dL 0.3  0.3  0.3   Alkaline Phos 38 - 126 U/L 48  50  50   AST 15 - 41 U/L _2 ALT 0 - 44 U/L _3 Lab Results  Component Value Date   WBC 3.9 (L) 06/12/2022   HGB 14.6 06/12/2022   HCT 43.4 06/12/2022   MCV 91.9 06/12/2022   PLT 201 06/12/2022   NEUTROABS 3.5 09/20/2021    ASSESSMENT & PLAN:  Malignant neoplasm of upper-inner quadrant of right breast in female, estrogen receptor positive ([place]) 04/08/2021:Palpable right breast mass: Mammogram and US showed a 4.5cm right breast mass at the 12-1 o'clock position with associated microcalcifications, multiple additional masses from the 9-11 o'clock position in the right  breast, calcifications in the subareolar right breast, 1.4cm, and two enlarged right axillary lymph nodes. 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  Neoadjuvant chemotherapy with dose dense Adriamycin and Cytoxan x4 followed by Taxol weekly x12 completed 09/14/21 2. Bilateral mastectomies with targeted node dissection 10/21/21 Left: Benign Right: 6 cm residual IDC 2/7 LN Positive ER: 80%, PR 1%, Her 2 Neg 3.  Adjuvant radiation 12/01/2021-01/11/2022 4.  Oophorectomy 06/12/2022 -------------------------------------------------------------------------------------------------------------------- Current treatment: Letrozole started 02/01/2022.   Letrozole toxicities: Tolerating letrozole extremely well without any problems or concerns.  Abemaciclib counseling: I discussed at length the risks and benefits of Abemaciclib in combination with letrozole. Adverse effects of Abemaciclib include decreasing  neutrophil count, pneumonia, blood clots in lungs as well as nausea and GI symptoms. Side effects of letrozole include hot flashes, muscle aches and pains, uterine bleeding/spotting/cancer, osteoporosis, risk of blood clots.  We will heart rate 100 mg p.o. twice daily and then titrate appropriately.    Breast cancer surveillance: 1.  Breast exam 06/27/2022: Benign 2. mammogram: No role of imaging since she had bilateral mastectomies    Return to clinic in 3 weeks for follow-up with Jenny Reichmann.  We will alternate her visits.  She will get labs and follow-up initially monthly and then we can extend it every 2 months.    No orders of the defined types were placed in this encounter.  The patient has a good understanding of the overall plan. she agrees with it. she will call with any problems that may develop before the next visit here. Total time spent: 45 mins including face to face time and time spent for planning, charting and co-ordination of care   Harriette Ohara, MD 08/15/22    I Gardiner Coins am scribing for Dr. Lindi Adie  I have reviewed the above documentation for accuracy and completeness, and I agree with the above.

## 2022-08-15 NOTE — Telephone Encounter (Signed)
Oral Oncology Pharmacist Encounter  Received new prescription for abemaciclib (Verzenio) for the treatment of locally advanced HR positive, HER2 negative breast cancer in conjunction with letrozole, planned duration until disease progression or unacceptable toxicity.  Per MD, labs from 09/20/2022 are okay to proceed with starting treatment and have these as our baseline labs. Labs assessed, no interventions needed. Prescription dose and frequency assessed.  Current medication list in Epic reviewed, no DDIs with verzenio identified.  Evaluated chart and no patient barriers to medication adherence noted.   Patient agreement for treatment documented in MD note on 08/15/2022.  Prescription has been e-scribed to the [facility] for benefits analysis and approval.  Oral Oncology Clinic will continue to follow for insurance authorization, copayment issues, initial counseling and start date.  Drema Halon, PharmD Hematology/Oncology Clinical Pharmacist [facility] [phone] 08/15/2022 12:44 PM

## 2022-08-15 NOTE — Telephone Encounter (Signed)
Oral Oncology Patient Advocate Encounter  Prior Authorization for Chelsea Wise has been approved.    PA#  [id number] Effective dates: 07/14/2022 through 08/15/2023  Patients co-pay is $0.    [name], CPhT-Adv Oncology Pharmacy Patient [place] Direct Number: [phone]  Fax: [phone]

## 2022-08-15 NOTE — Telephone Encounter (Signed)
Oral Oncology Patient Advocate Encounter   Received notification that prior authorization for Verzenio is required.   PA submitted on 08/15/2022 Key [id number] Status is pending     Chelsea Wise, CPhT-Adv Oncology Pharmacy Patient [place] Direct Number: [phone]  Fax: [phone]

## 2022-08-15 NOTE — Assessment & Plan Note (Addendum)
04/08/2021:Palpable right breast mass: Mammogram and US showed a 4.5cm right breast mass at the 12-1 o'clock position with associated microcalcifications, multiple additional masses from the 9-11 o'clock position in the right breast, calcifications in the subareolar right breast, 1.4cm, and two enlarged right axillary lymph nodes. 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 Neoadjuvant chemotherapy with dose dense Adriamycin and Cytoxan x4 followed by Taxol weekly x12completed 09/14/21 2.Bilateralmastectomieswith targeted node dissection 10/21/21 Left: Benign Right: 6 cm residual IDC 2/7 LN Positive ER: 80%, PR 1%, Her 2 Neg 3. Adjuvant radiation12/29/2022-01/11/2022 4.  Oophorectomy 06/12/2022 -------------------------------------------------------------------------------------------------------------------- Current treatment: Letrozole started 02/01/2022.  Letrozole toxicities: Tolerating letrozole extremely well without any problems or concerns.  Abemaciclib counseling: I discussed at length the risks and benefits of Abemaciclib in combination with letrozole. Adverse effects of Abemaciclib include decreasing neutrophil count, pneumonia, blood clots in lungs as well as nausea and GI symptoms. Side effects of letrozole include hot flashes, muscle aches and pains, uterine bleeding/spotting/cancer, osteoporosis, risk of blood clots.  We will heart rate 100 mg p.o. twice daily and then titrate appropriately.   Breast cancer surveillance: 1.  Breast exam 06/27/2022: Benign 2. mammogram: No role of imaging since she had bilateral mastectomies   Return to clinic in 3 weeks for follow-up with John.  We will alternate her visits.  She  will get labs and follow-up initially monthly and then we can extend it every 2 months.   

## 2022-08-16 ENCOUNTER — Other Ambulatory Visit (HOSPITAL_COMMUNITY): Payer: Self-pay

## 2022-08-17 ENCOUNTER — Other Ambulatory Visit (HOSPITAL_COMMUNITY): Payer: Self-pay

## 2022-08-17 NOTE — Telephone Encounter (Addendum)
Oral Chemotherapy Pharmacist Encounter  I spoke with patient for overview of: Verzenio for the treatment of locally advanced HR positive, HER2 negative breast cancer,  in combination with letrozole, planned duration until disease progression or unacceptable toxicity.   Counseled patient on administration, dosing, side effects, monitoring, drug-food interactions, safe handling, storage, and disposal.  Patient will take Verzenio 172m tablets, 1 tablet by mouth twice daily without regard to food.  Patient knows to avoid grapefruit and grapefruit juice.  Verzenio start date: 9/16/20203  Adverse effects include but are not limited to: diarrhea, fatigue, nausea, abdominal pain, decreased blood counts, and increased liver function tests, and joint pains. Severe, life-threatening, and/or fatal interstitial lung disease (ILD) and/or pneumonitis may occur with CDK 4/6 inhibitors.  Patient does not have anti-emetic on hand and knows to call the office if she has nausea.  Patient will obtain anti diarrheal and alert the office of 4 or more loose stools above baseline.  Reviewed with patient importance of keeping a medication schedule and plan for any missed doses. No barriers to medication adherence identified.  Medication reconciliation performed and medication/allergy list updated.  Insurance authorization for [employer] been obtained. Test claim at the pharmacy revealed copayment $0 for 1st fill of 28 days. [place] [facility] [place] to deliver to patient's home on 08/18/2022.  [facility]  Patient informed the pharmacy will reach out 5-7 days prior to needing next fill of Verzenio to coordinate continued medication acquisition to prevent break in therapy.  All questions answered.  Mrs. NBenegasvoiced understanding and appreciation.    Medication education handout placed in mail for patient. Patient knows to call the office with questions or concerns. Oral Chemotherapy Clinic phone number provided to patient.   KDrema Halon PharmD Hematology/Oncology Clinical Pharmacist [phone]/14/2023   11:28 AM

## 2022-09-05 ENCOUNTER — Other Ambulatory Visit (HOSPITAL_COMMUNITY): Payer: Self-pay

## 2022-09-05 ENCOUNTER — Inpatient Hospital Stay (HOSPITAL_BASED_OUTPATIENT_CLINIC_OR_DEPARTMENT_OTHER): Payer: Federal, State, Local not specified - PPO | Admitting: Hematology and Oncology

## 2022-09-05 ENCOUNTER — Inpatient Hospital Stay: Payer: Federal, State, Local not specified - PPO | Attending: Adult Health

## 2022-09-05 ENCOUNTER — Inpatient Hospital Stay: Payer: Federal, State, Local not specified - PPO | Admitting: Pharmacist

## 2022-09-05 VITALS — BP 121/72 | HR 68 | Temp 97.7°F | Resp 18 | Ht 63.5 in | Wt 139.4 lb

## 2022-09-05 DIAGNOSIS — D72819 Decreased white blood cell count, unspecified: Secondary | ICD-10-CM | POA: Insufficient documentation

## 2022-09-05 DIAGNOSIS — E222 Syndrome of inappropriate secretion of antidiuretic hormone: Secondary | ICD-10-CM | POA: Insufficient documentation

## 2022-09-05 DIAGNOSIS — Z79811 Long term (current) use of aromatase inhibitors: Secondary | ICD-10-CM | POA: Insufficient documentation

## 2022-09-05 DIAGNOSIS — C50211 Malignant neoplasm of upper-inner quadrant of right female breast: Secondary | ICD-10-CM

## 2022-09-05 DIAGNOSIS — Z9013 Acquired absence of bilateral breasts and nipples: Secondary | ICD-10-CM | POA: Diagnosis not present

## 2022-09-05 DIAGNOSIS — Z170 Estrogen receptor positive status [ER+]: Secondary | ICD-10-CM | POA: Diagnosis not present

## 2022-09-05 DIAGNOSIS — Z853 Personal history of malignant neoplasm of breast: Secondary | ICD-10-CM | POA: Insufficient documentation

## 2022-09-05 LAB — CMP (CANCER CENTER ONLY)
ALT: 12 U/L (ref 0–44)
AST: 16 U/L (ref 15–41)
Albumin: 4.4 g/dL (ref 3.5–5.0)
Alkaline Phosphatase: 59 U/L (ref 38–126)
Anion gap: 4 — ABNORMAL LOW (ref 5–15)
BUN: 9 mg/dL (ref 6–20)
CO2: 31 mmol/L (ref 22–32)
Calcium: 9.4 mg/dL (ref 8.9–10.3)
Chloride: 104 mmol/L (ref 98–111)
Creatinine: 0.93 mg/dL (ref 0.44–1.00)
GFR, Estimated: >60 mL/min (ref >60)
Glucose, Bld: 89 mg/dL (ref 70–99)
Potassium: 3.7 mmol/L (ref 3.5–5.1)
Sodium: 139 mmol/L (ref 135–145)
Total Bilirubin: 0.4 mg/dL (ref 0.3–1.2)
Total Protein: 7.1 g/dL (ref 6.5–8.1)

## 2022-09-05 LAB — CBC WITH DIFFERENTIAL (CANCER CENTER ONLY)
Abs Immature Granulocytes: 0.01 10*3/uL (ref 0.00–0.07)
Basophils Absolute: 0 10*3/uL (ref 0.0–0.1)
Basophils Relative: 0 %
Eosinophils Absolute: 0 10*3/uL (ref 0.0–0.5)
Eosinophils Relative: 0 %
HCT: 36.6 % (ref 36.0–46.0)
Hemoglobin: 12.4 g/dL (ref 12.0–15.0)
Immature Granulocytes: 0 %
Lymphocytes Relative: 35 %
Lymphs Abs: 1.6 10*3/uL (ref 0.7–4.0)
MCH: 30.7 pg (ref 26.0–34.0)
MCHC: 33.9 g/dL (ref 30.0–36.0)
MCV: 90.6 fL (ref 80.0–100.0)
Monocytes Absolute: 0.2 10*3/uL (ref 0.1–1.0)
Monocytes Relative: 4 %
Neutro Abs: 2.7 10*3/uL (ref 1.7–7.7)
Neutrophils Relative %: 61 %
Platelet Count: 186 10*3/uL (ref 150–400)
RBC: 4.04 MIL/uL (ref 3.87–5.11)
RDW: 12.9 % (ref 11.5–15.5)
WBC Count: 4.5 10*3/uL (ref 4.0–10.5)
nRBC: 0 % (ref 0.0–0.2)

## 2022-09-05 MED ORDER — ABEMACICLIB 100 MG PO TABS: 100.0000 mg | ORAL_TABLET | Freq: Two times a day (BID) | ORAL | 2 refills | Status: DC

## 2022-09-05 MED ORDER — PROCHLORPERAZINE MALEATE 10 MG PO TABS
10.0000 mg | ORAL_TABLET | Freq: Four times a day (QID) | ORAL | 2 refills | Status: DC | PRN
Start: 2022-09-05 — End: 2024-08-05

## 2022-09-05 MED ORDER — ONDANSETRON HCL 8 MG PO TABS: 8.0000 mg | ORAL_TABLET | Freq: Three times a day (TID) | ORAL | 2 refills | Status: DC | PRN

## 2022-09-05 NOTE — Progress Notes (Signed)
Patient Care Team: Edwinna Areola, DO as PCP - General (Obstetrics and Gynecology) Lonie Peak, MD as Consulting Physician (Radiation Oncology) Emelia Loron, MD as Consulting Physician (General Surgery) Serena Croissant, MD as Consulting Physician (Hematology and Oncology)  DIAGNOSIS:  Encounter Diagnoses  Name Primary?   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   Malignant neoplasm of upper-inner quadrant of right breast in female, estrogen receptor positive (HCC)     SUMMARY OF ONCOLOGIC HISTORY: Oncology History  Malignant neoplasm of upper-inner quadrant of right breast in female, estrogen receptor positive (HCC)  04/08/2021 Initial Diagnosis   Palpable right breast mass: Mammogram and US showed a 4.5cm right breast mass at the 12-1 o'clock position with associated microcalcifications, multiple additional masses from the 9-11 o'clock position in the right breast, calcifications in the subareolar right breast, 1.4cm, and two enlarged right axillary lymph nodes. Biopsy showed invasive and in situ ductal carcinoma in the breast and axilla, grade 2 ER 80%, PR 1%, Ki-67 25%, HER2 equivalent by [facility]   04/08/2021 Cancer Staging   Staging form: Breast, AJCC 8th Edition - Clinical stage from 04/08/2021: Stage IIA (cT2, cN1, cM0, G2, ER+, PR+, HER2-) - Signed by Serena Croissant, MD on 04/13/2021 Stage prefix: Initial diagnosis Histologic grading system: 3 grade system   04/19/2021 Genetic Testing   Negative genetic testing:  No pathogenic variants detected on the [employer] BRCAplus or CancerNext-Expanded + RNAinsight panels. Two variants of uncertain significance (VUS) were detected - one in the APC gene called p.T2422I (c.7265C>T) and a second in the BAP1 gene called p.P302L (c.905C>T). The report dates are 04/19/2021 and 04/25/2021, respectively.  The BRCAplus panel offered by [employer] and includes sequencing and deletion/duplication analysis for the following 8 genes:  ATM, BRCA1, BRCA2, CDH1, CHEK2, PALB2, PTEN, and TP53. The CancerNext-Expanded + RNAinsight gene panel offered by [employer] and includes sequencing and rearrangement analysis for the following 77 genes: AIP, ALK, APC, ATM, AXIN2, BAP1, BARD1, BLM, BMPR1A, BRCA1, BRCA2, BRIP1, CDC73, CDH1, CDK4, CDKN1B, CDKN2A, CHEK2, CTNNA1, DICER1, FANCC, FH, FLCN, GALNT12, KIF1B, LZTR1, MAX, MEN1, MET, MLH1, MSH2, MSH3, MSH6, MUTYH, NBN, NF1, NF2, NTHL1, PALB2, PHOX2B, PMS2, POT1, PRKAR1A, PTCH1, PTEN, RAD51C, RAD51D, RB1, RECQL, RET, SDHA, SDHAF2, SDHB, SDHC, SDHD, SMAD4, SMARCA4, SMARCB1, SMARCE1, STK11, SUFU, TMEM127, TP53, TSC1, TSC2, VHL and XRCC2 (sequencing and deletion/duplication); EGFR, EGLN1, HOXB13, KIT, MITF, PDGFRA, POLD1 and POLE (sequencing only); EPCAM and GREM1 (deletion/duplication only). RNA data is routinely analyzed for use in variant interpretation for all genes.    05/10/2021 - 09/20/2021 Chemotherapy   Patient is on Treatment Plan : BREAST ADJUVANT DOSE DENSE AC q14d / PACLitaxel q7d     10/21/2021 Surgery   Bilateral mastectomies: Left breast benign.  Right breast showed 6 cm of invasive ductal carcinoma, grade 2, margins negative, 2 out of 7 lymph nodes were positive for macrometastases.  ER positive, PR negative, HER2 negative.   10/21/2021 Cancer Staging   Staging form: Breast, AJCC 8th Edition - Pathologic stage from 10/21/2021: No Stage Recommended (ypT3, pN1a, cM0, G2, ER+, PR-, HER2-) - Signed by Loa Socks, NP on 04/03/2022 Stage prefix: Post-therapy Histologic grading system: 3 grade system   11/30/2021 - 01/11/2022 Radiation Therapy   Site Technique Total Dose (Gy) Dose per Fx (Gy) Completed Fx Beam Energies  Chest Wall, Right: CW_R_IMN 3D 50/50 2 25/25 6X, 10X  Chest Wall, Right: CW_R_PAB_SCV 3D 50/50 2 25/25 6X, 10X  Chest Wall, Right: CW_R_Bst Electron 10/10 2 5/5 6E  CHIEF COMPLIANT: Follow-up breast pain  INTERVAL HISTORY: Chelsea Wise is a  41 y.o. with the above-mentioned. She presents to the clinic for a follow-up. She reports today that her left breast hurts. Tender to the touch and it's inflamed. She denies going over weight lifting. She does do the big arm circles. She states that se had diarrhea twice. She took imodium and it subsided.  ALLERGIES:  has No Known Allergies.  MEDICATIONS:  Current Outpatient Medications  Medication Sig Dispense Refill   abemaciclib (VERZENIO) 100 MG tablet Take 1 tablet (100 mg total) by mouth 2 (two) times daily. 56 tablet 2   acetaminophen (TYLENOL) 500 MG tablet Take 500-1,000 mg by mouth every 6 (six) hours as needed for moderate pain.     Ascorbic Acid (VITAMIN C PO) Take 1 tablet by mouth daily. (Patient not taking: Reported on 09/05/2022)     cetirizine (ZYRTEC) 10 MG tablet Take 10 mg by mouth daily as needed for allergies. (Patient not taking: Reported on 09/05/2022)     letrozole (FEMARA) 2.5 MG tablet Take 1 tablet (2.5 mg total) by mouth daily. 90 tablet 3   loperamide (IMODIUM) 1 MG/5ML solution Take 2 mg by mouth as needed for diarrhea or loose stools.     loteprednol (LOTEMAX) 0.5 % ophthalmic suspension 3 drops 3 (three) times daily. (Patient not taking: Reported on 09/05/2022)     Multiple Vitamin (MULTIVITAMIN WITH MINERALS) TABS tablet Take 2 tablets by mouth daily.     Noni, Morinda citrifolia, (NONI JUICE) 3000 MG/30ML LIQD Take 60 mLs by mouth daily.     ondansetron (ZOFRAN) 8 MG tablet Take 1 tablet (8 mg total) by mouth every 8 (eight) hours as needed for nausea or vomiting. 30 tablet 2   prochlorperazine (COMPAZINE) 10 MG tablet Take 1 tablet (10 mg total) by mouth every 6 (six) hours as needed for nausea or vomiting. 30 tablet 2   No current facility-administered medications for this visit.    PHYSICAL EXAMINATION: ECOG PERFORMANCE STATUS: 1 - Symptomatic but completely ambulatory  There were no vitals filed for this visit. There were no vitals filed for this  visit.    LABORATORY DATA:  I have reviewed the data as listed    Latest Ref Rng & Units 09/05/2022   10:35 AM 09/20/2021   11:23 AM 09/13/2021    1:08 PM  CMP  Glucose 70 - 99 mg/dL 89  93  83   BUN 6 - 20 mg/dL $Remove'9  8  10   'DnjyWwK$ Creatinine 0.44 - 1.00 mg/dL 0.93  0.70  0.70   Sodium 135 - 145 mmol/L 139  141  138   Potassium 3.5 - 5.1 mmol/L 3.7  3.8  3.7   Chloride 98 - 111 mmol/L 104  107  104   CO2 22 - 32 mmol/L $RemoveB'31  24  24   'zAkpHzmf$ Calcium 8.9 - 10.3 mg/dL 9.4  9.5  9.3   Total Protein 6.5 - 8.1 g/dL 7.1  7.2  7.1   Total Bilirubin 0.3 - 1.2 mg/dL 0.4  0.3  0.3   Alkaline Phos 38 - 126 U/L 59  48  50   AST 15 - 41 U/L $Remo'16  19  17   'XYxiH$ ALT 0 - 44 U/L $Remo'12  13  17     'cvByj$ Lab Results  Component Value Date   WBC 4.5 09/05/2022   HGB 12.4 09/05/2022   HCT 36.6 09/05/2022   MCV 90.6 09/05/2022  PLT 186 09/05/2022   NEUTROABS 2.7 09/05/2022    ASSESSMENT & PLAN:  Malignant neoplasm of upper-inner quadrant of right breast in female, estrogen receptor positive ([place]) 04/08/2021:Palpable right breast mass: Mammogram and US showed a 4.5cm right breast mass at the 12-1 o'clock position with associated microcalcifications, multiple additional masses from the 9-11 o'clock position in the right breast, calcifications in the subareolar right breast, 1.4cm, and two enlarged right axillary lymph nodes. 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  Neoadjuvant chemotherapy with dose dense Adriamycin and Cytoxan x4 followed by Taxol weekly x12 completed 09/14/21 2. Bilateral mastectomies with targeted node dissection 10/21/21 Left: Benign Right: 6 cm residual IDC 2/7 LN Positive ER: 80%, PR 1%, Her 2 Neg 3.  Adjuvant radiation  12/01/2021-01/11/2022 4.  Oophorectomy 06/12/2022 -------------------------------------------------------------------------------------------------------------------- Current treatment: Letrozole started 02/01/2022.   Letrozole toxicities: Tolerating letrozole extremely well without any problems or concerns.  Abemaciclib toxicities: Alternating diarrhea with constipation   Chest wall pain on the right side: There appears to be some inflammation of the skin in the muscle along the areas that she had prior radiation.  I encouraged her to take an Advil or Voltaren gel topically to treat the pain and discomfort.   Breast cancer surveillance: 1.  Breast exam 06/27/2022: Benign 2. mammogram: No role of imaging since she had bilateral mastectomies   Return to clinic in 1 month with labs and follow-up    Orders Placed This Encounter  Procedures   CBC with Differential ([place] Only)    Standing Status:   Future    Standing Expiration Date:   09/06/2023   CMP ([place] only)    Standing Status:   Future    Standing Expiration Date:   09/06/2023   The patient has a good understanding of the overall plan. she agrees with it. she will call with any problems that may develop before the next visit here. Total time spent: 30 mins including face to face time and time spent for planning, charting and co-ordination of care   Harriette Ohara, MD 09/05/22    I Gardiner Coins am scribing for Dr. Lindi Adie  I have reviewed the above documentation for accuracy and completeness, and I agree with the above.

## 2022-09-05 NOTE — Progress Notes (Signed)
[place]       Telephone: [phone]?Fax: [phone]   Oncology Clinical Pharmacist Practitioner Initial Assessment  Chelsea Wise is a 41 y.o. female with a diagnosis of breast cancer. They were contacted today via in-person visit.  Indication/Regimen Abemaciclib (Verzenio) is being used appropriately for treatment of breast cancer by Dr. Nicholas Lose.      Wt Readings from Last 1 Encounters:  09/05/22 139 lb 6.4 oz (63.2 kg)    Estimated body surface area is 1.68 meters squared as calculated from the following:   Height as of this encounter: 5' 3.5" (1.613 m).   Weight as of this encounter: 139 lb 6.4 oz (63.2 kg).  The dosing regimen is 100 mg by mouth every 12 hours on days 1 to 28 of a 28-day cycle. This is being given  in combination with letrozole . It is planned to continue until  two years in the adjuvant setting per the monarchE trial data .  Chelsea Wise was seen today by clinical pharmacy to establish care for her abemaciclib management after being referred by Dr. Lindi Adie.  She started abemaciclib on 08/19/22 and so far is tolerating it fairly well.  She does report some nausea which currently she is drinking fluids for and watching what she eats.  She also reports some diarrhea last Saturday and last Thursday which she took liquid loperamide for with resolution.  She does not have any antinausea medications at home currently and she requested that we send ondansetron and prochlorperazine to her pharmacy of choice which will be done.  We will also be [facility]  During our discussion today, she did report having some right breast tenderness and states that it is somewhat red near where her mastectomy scar is located.  Dr. Lindi Adie was kind enough to see her in one of his exam rooms to assess the area.  We did discuss that manufacturing guidelines recommend labs every 2 weeks for 2 months followed  by monthly labs for 2 months, then as clinically indicated.  At those lab visits she will also see clinical pharmacy or Dr. Lindi Adie to evaluate for toxicities.  We reviewed possible toxicities of abemaciclib which include but are not limited to diarrhea, neutropenia, pneumonitis, liver toxicity, blood clots, fatigue, nausea, and dysgeusia.  We also went over proper storage and handling of the medication and to take abemaciclib every 12 hours with or without food.  She also knows to avoid grapefruit products.    Dose Modifications Dr. Lindi Adie is starting her off at abemaciclib 100 mg every 12 hours. She may consider increasing the dose to 150 mg every 12 hours as tolerated.  Access Assessment Chelsea Wise will [facility] Insurance Concerns: None Start date if known: 08/19/22  Allergies No Known Allergies  Vitals    09/05/2022   11:17 AM 08/15/2022   11:00 AM 07/24/2022   10:13 AM  Vitals with BMI  Height 5' 3.5" 5' 3.5"   Weight 139 lbs 6 oz 140 lbs 8 oz 140 lbs 6 oz  BMI 70.2 63.78   Systolic 588 502   Diastolic 72 86   Pulse 68 65    Temp Readings from Last 3 Encounters:  09/05/22 97.7 F (36.5 C) (Tympanic)  08/15/22 97.8 F (36.6 C) (Temporal)  06/27/22 (!) 97.2 F (36.2 C) (Temporal)     Laboratory Data    Latest Ref  Rng & Units 09/05/2022   10:35 AM 06/12/2022    7:30 AM 09/20/2021   11:23 AM  CBC EXTENDED  WBC 4.0 - 10.5 K/uL 4.5  3.9  5.2   RBC 3.87 - 5.11 MIL/uL 4.04  4.72  3.50   Hemoglobin 12.0 - 15.0 g/dL 12.4  14.6  11.2   HCT 36.0 - 46.0 % 36.6  43.4  33.3   Platelets 150 - 400 K/uL 186  201  301   NEUT# 1.7 - 7.7 K/uL 2.7   3.5   Lymph# 0.7 - 4.0 K/uL 1.6   1.1        Latest Ref Rng & Units 09/05/2022   10:35 AM 09/20/2021   11:23 AM 09/13/2021    1:08 PM  CMP  Glucose 70 - 99 mg/dL 89  93  83   BUN 6 - 20 mg/dL '9  8  10   '$ Creatinine 0.44 - 1.00 mg/dL 0.93  0.70  0.70   Sodium 135 - 145 mmol/L 139   141  138   Potassium 3.5 - 5.1 mmol/L 3.7  3.8  3.7   Chloride 98 - 111 mmol/L 104  107  104   CO2 22 - 32 mmol/L '31  24  24   '$ Calcium 8.9 - 10.3 mg/dL 9.4  9.5  9.3   Total Protein 6.5 - 8.1 g/dL 7.1  7.2  7.1   Total Bilirubin 0.3 - 1.2 mg/dL 0.4  0.3  0.3   Alkaline Phos 38 - 126 U/L 59  48  50   AST 15 - 41 U/L '16  19  17   '$ ALT 0 - 44 U/L '12  13  17    '$ No results found for: "MG"   Contraindications Contraindications were reviewed?  Yes Contraindications to therapy were identified?  No  Safety Precautions The following safety precautions for the use of abemaciclib were reviewed:  Diarrhea: we reviewed that diarrhea is common with abemaciclib and confirmed that she does have loperamide (Imodium) at home.  We reviewed how to take this medication PRN and gave her information on abemaciclib Neutropenia: we discussed the importance of having a thermometer and what the Centers for Disease Control and Prevention (CDC) considers a fever which is 100.59F (38C) or higher.  Gave patient 24/7 triage line to call if any fevers or symptoms ILD/Pneumonitis: we reviewed potential symptoms including cough, shortness, and fatigue. Hepatotoxicity: reviewed to contact clinic for RUQ pain that will not subside, yellowing of eyes/skin Venous thromboembolism (VTE): reviewed signs of deep vein thrombosis (DVT) such as leg swelling, redness, pain, or tenderness and signs of pulmonary embolism (PE) such as shortness of breath, rapid or irregular heartbeat, cough, chest pain, or lightheadedness Reviewed to take the medication every 12 hours (with food sometimes can be easier on the stomach) and to take it at the same time every day. Discussed proper storage and handling of abemaciclib Storage and handling  Medication Reconciliation Current Outpatient Medications  Medication Sig Dispense Refill   abemaciclib (VERZENIO) 100 MG tablet Take 1 tablet (100 mg total) by mouth 2 (two) times daily. 60 tablet 0    acetaminophen (TYLENOL) 500 MG tablet Take 500-1,000 mg by mouth every 6 (six) hours as needed for moderate pain.     letrozole (FEMARA) 2.5 MG tablet Take 1 tablet (2.5 mg total) by mouth daily. 90 tablet 3   loperamide (IMODIUM) 1 MG/5ML solution Take 2 mg by mouth as needed for diarrhea or loose stools.  Multiple Vitamin (MULTIVITAMIN WITH MINERALS) TABS tablet Take 2 tablets by mouth daily.     Chelsea Wise, Chelsea Wise, (Chelsea Wise JUICE) 3000 MG/30ML LIQD Take 60 mLs by mouth daily.     Ascorbic Acid (VITAMIN C PO) Take 1 tablet by mouth daily. (Patient not taking: Reported on 09/05/2022)     cetirizine (ZYRTEC) 10 MG tablet Take 10 mg by mouth daily as needed for allergies. (Patient not taking: Reported on 09/05/2022)     loteprednol (LOTEMAX) 0.5 % ophthalmic suspension 3 drops 3 (three) times daily. (Patient not taking: Reported on 09/05/2022)     No current facility-administered medications for this visit.    Medication reconciliation is based on the patient's most recent medication list in the electronic medical record (EMR) including herbal products and OTC medications.   The patient's medication list was reviewed today with the patient?  Yes  Drug-drug interactions (DDIs) DDIs were evaluated?  Yes Significant DDIs identified?  No  Drug-Food Interactions Drug-food interactions were evaluated?  Yes Drug-food interactions identified?  Yes, she will avoid grapefruit products while on abemaciclib.  Follow-up Plan  Continue abemaciclib 100 mg by mouth every 12 hours.  [facility]  May consider increasing dose of abemaciclib if tolerated. Continue letrozole 2.5 mg by mouth daily Use ondansetron and prochlorperazine as needed for nausea.  Prescription sent to her pharmacy of choice.  Labs, pharmacy clinic visit, on 09/19/22 and 10/03/22.  She will tentatively see Dr. Lindi Adie with labs again on 10/17/22.  At that time she can start monthly labs for 2  months if tolerating abemaciclib.  Chelsea Wise participated in the discussion, expressed understanding, and voiced agreement with the above plan. All questions were answered to her satisfaction. The patient was advised to contact the clinic at [phone] with any questions or concerns prior to her return visit.   I spent 60 minutes assessing the patient.  Chelsea Wise, RPH-CPP, 09/05/2022 12:04 PM  **Disclaimer: This note was dictated with voice recognition software. Similar sounding words can inadvertently be transcribed and this note may contain transcription errors which may not have been corrected upon publication of note.**

## 2022-09-05 NOTE — Assessment & Plan Note (Signed)
04/08/2021:Palpable right breast mass: Mammogram and US showed a 4.5cm right breast mass at the 12-1 o'clock position with associated microcalcifications, multiple additional masses from the 9-11 o'clock position in the right breast, calcifications in the subareolar right breast, 1.4cm, and two enlarged right axillary lymph nodes. 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 Neoadjuvant chemotherapy with dose dense Adriamycin and Cytoxan x4 followed by Taxol weekly x12completed 09/14/21 2.Bilateralmastectomieswith targeted node dissection 10/21/21 Left: Benign Right: 6 cm residual IDC 2/7 LN Positive ER: 80%, PR 1%, Her 2 Neg 3. Adjuvant radiation12/29/2022-01/11/2022 4.  Oophorectomy 06/12/2022 -------------------------------------------------------------------------------------------------------------------- Current treatment: Letrozole started 02/01/2022.  Letrozole toxicities: Tolerating letrozole extremely well without any problems or concerns.  Abemaciclib toxicities: Alternating diarrhea with constipation   Chest wall pain on the right side: There appears to be some inflammation of the skin in the muscle along the areas that she had prior radiation.  I encouraged her to take an Advil or Voltaren gel topically to treat the pain and discomfort.  Breast cancer surveillance: 1.  Breast exam 06/27/2022: Benign 2. mammogram: No role of imaging since she had bilateral mastectomies  Return to clinic in 1 month with labs and follow-up

## 2022-09-06 ENCOUNTER — Telehealth: Payer: Self-pay | Admitting: Hematology and Oncology

## 2022-09-06 NOTE — Telephone Encounter (Signed)
Scheduled appointment per 10/3 los. Patient is aware.

## 2022-09-07 ENCOUNTER — Other Ambulatory Visit (HOSPITAL_COMMUNITY): Payer: Self-pay

## 2022-09-07 ENCOUNTER — Telehealth: Payer: Self-pay | Admitting: Pharmacist

## 2022-09-07 NOTE — Telephone Encounter (Signed)
Contacted patient to scheduled appointments. Patient is aware of appointments that are scheduled.   Only scheduled the 2 week follow up because pt is already scheduled with [facility].

## 2022-09-20 ENCOUNTER — Inpatient Hospital Stay: Payer: Federal, State, Local not specified - PPO

## 2022-09-20 ENCOUNTER — Inpatient Hospital Stay: Payer: Federal, State, Local not specified - PPO | Admitting: Pharmacist

## 2022-09-20 VITALS — BP 138/83 | HR 70 | Temp 97.8°F | Resp 18 | Ht 63.5 in | Wt 140.4 lb

## 2022-09-20 DIAGNOSIS — Z170 Estrogen receptor positive status [ER+]: Secondary | ICD-10-CM

## 2022-09-20 DIAGNOSIS — E222 Syndrome of inappropriate secretion of antidiuretic hormone: Secondary | ICD-10-CM | POA: Diagnosis not present

## 2022-09-20 LAB — CBC WITH DIFFERENTIAL (CANCER CENTER ONLY)
Abs Immature Granulocytes: 0.02 10*3/uL (ref 0.00–0.07)
Basophils Absolute: 0 10*3/uL (ref 0.0–0.1)
Basophils Relative: 1 %
Eosinophils Absolute: 0 10*3/uL (ref 0.0–0.5)
Eosinophils Relative: 1 %
HCT: 36.8 % (ref 36.0–46.0)
Hemoglobin: 12.5 g/dL (ref 12.0–15.0)
Immature Granulocytes: 1 %
Lymphocytes Relative: 38 %
Lymphs Abs: 1.5 10*3/uL (ref 0.7–4.0)
MCH: 31.3 pg (ref 26.0–34.0)
MCHC: 34 g/dL (ref 30.0–36.0)
MCV: 92.2 fL (ref 80.0–100.0)
Monocytes Absolute: 0.2 10*3/uL (ref 0.1–1.0)
Monocytes Relative: 6 %
Neutro Abs: 2.1 10*3/uL (ref 1.7–7.7)
Neutrophils Relative %: 53 %
Platelet Count: 225 10*3/uL (ref 150–400)
RBC: 3.99 MIL/uL (ref 3.87–5.11)
RDW: 14.1 % (ref 11.5–15.5)
WBC Count: 3.9 10*3/uL — ABNORMAL LOW (ref 4.0–10.5)
nRBC: 0 % (ref 0.0–0.2)

## 2022-09-20 LAB — CMP (CANCER CENTER ONLY)
ALT: 11 U/L (ref 0–44)
AST: 19 U/L (ref 15–41)
Albumin: 4.5 g/dL (ref 3.5–5.0)
Alkaline Phosphatase: 60 U/L (ref 38–126)
Anion gap: 7 (ref 5–15)
BUN: 10 mg/dL (ref 6–20)
CO2: 30 mmol/L (ref 22–32)
Calcium: 9.6 mg/dL (ref 8.9–10.3)
Chloride: 104 mmol/L (ref 98–111)
Creatinine: 1.06 mg/dL — ABNORMAL HIGH (ref 0.44–1.00)
GFR, Estimated: >60 mL/min (ref >60)
Glucose, Bld: 68 mg/dL — ABNORMAL LOW (ref 70–99)
Potassium: 3.7 mmol/L (ref 3.5–5.1)
Sodium: 141 mmol/L (ref 135–145)
Total Bilirubin: 0.4 mg/dL (ref 0.3–1.2)
Total Protein: 7.7 g/dL (ref 6.5–8.1)

## 2022-09-20 NOTE — Progress Notes (Addendum)
[facility]       Telephone: [phone]?Fax: [phone]   Oncology Clinical Pharmacist Practitioner Progress Note  Chelsea Wise was contacted via in-person to discuss her chemotherapy regimen for abemaciclib which they receive under the care of Dr. Nicholas Wise.  Current treatment regimen and start date Abemaciclib (08/19/2022) Letrozole (02/01/2022)  Interval History She continues on abemaciclib 100 mg by mouth every 12 hours on days 1 to 28 of a 28-day cycle. This is being given in combination with letrozole 2.'5mg'$  by mouth daily . Therapy is planned to continue until  until 2 years in the adjuvant setting per the [facility] trial.  . The last time she saw the clinical pharmacy was 09/05/2022. She also saw Dr. Lindi Wise on 09/05/2022 for right breast redness and tenderness. She was instructed to apply Voltaren gel and use oral NSAIDs as needed.   Response to Therapy Today Ms. Chelsea Wise feels well. She reports breast tenderness and redness has improved. She mentioned that she lifted her son using her right arm and notes some pain when she turns her palm up and raises her arm up. She was advised to not lift using her right arm and to rest to see if that helps the pain. Dr. Lindi Wise will reassess in two weeks. She also noted a few episodes of diarrhea in which loperamide has resolved. She mentioned some nausea from time to time, but has not taken any antiemetics for it. She does have them on hand if needed. Labs, vitals, treatment parameters, and manufacturer guidelines assessing toxicity were reviewed with Chelsea Wise today. Based on these values, patient is in agreement to continue abemaciclib therapy at this time.  Allergies No Known Allergies  Vitals    09/20/2022   10:02 AM 09/05/2022   11:17 AM 08/15/2022   11:00 AM  Vitals with BMI  Height 5' 3.5" 5' 3.5" 5' 3.5"  Weight 140 lbs 6 oz 139 lbs 6 oz 140 lbs 8 oz  BMI 24.48 69.6 78.93  Systolic 810 175 102   Diastolic 83 72 86  Pulse 70 68 65   Temp Readings from Last 3 Encounters:  09/20/22 97.8 F (36.6 C) (Temporal)  09/05/22 97.7 F (36.5 C) (Tympanic)  08/15/22 97.8 F (36.6 C) (Temporal)    Laboratory Data    Latest Ref Rng & Units 09/20/2022    9:36 AM 09/05/2022   10:35 AM 06/12/2022    7:30 AM  CBC EXTENDED  WBC 4.0 - 10.5 K/uL 3.9  4.5  3.9   RBC 3.87 - 5.11 MIL/uL 3.99  4.04  4.72   Hemoglobin 12.0 - 15.0 g/dL 12.5  12.4  14.6   HCT 36.0 - 46.0 % 36.8  36.6  43.4   Platelets 150 - 400 K/uL 225  186  201   NEUT# 1.7 - 7.7 K/uL 2.1  2.7    Lymph# 0.7 - 4.0 K/uL 1.5  1.6         Latest Ref Rng & Units 09/20/2022    9:36 AM 09/05/2022   10:35 AM 09/20/2021   11:23 AM  CMP  Glucose 70 - 99 mg/dL 68  89  93   BUN 6 - 20 mg/dL '10  9  8   '$ Creatinine 0.44 - 1.00 mg/dL 1.06  0.93  0.70   Sodium 135 - 145 mmol/L 141  139  141   Potassium 3.5 - 5.1 mmol/L 3.7  3.7  3.8   Chloride 98 - 111  mmol/L 104  104  107   CO2 22 - 32 mmol/L '30  31  24   '$ Calcium 8.9 - 10.3 mg/dL 9.6  9.4  9.5   Total Protein 6.5 - 8.1 g/dL 7.7  7.1  7.2   Total Bilirubin 0.3 - 1.2 mg/dL 0.4  0.4  0.3   Alkaline Phos 38 - 126 U/L 60  59  48   AST 15 - 41 U/L '19  16  19   '$ ALT 0 - 44 U/L '11  12  13     '$ No results found for: "MG"  Adverse Effects Assessment Diarrhea: take loperamide as needed Nausea: take ondansetron and/or prochlorperazine as needed Increased Serum Creatinine: increased oral hydration was recommended  Adherence Assessment Chelsea Wise reports missing 0 doses over the past 2 weeks.   Reason for missed dose: NA Patient was re-educated on importance of adherence.   Access Assessment Chelsea Wise is currently receiving her abemaciclib through  [place] concerns:  NA  Medication Reconciliation The patient's medication list was reviewed today with the patient? Yes New medications or herbal supplements have recently been started? No  Any  medications have been discontinued? No  The medication list was updated and reconciled based on the patient's most recent medication list in the electronic medical record (EMR) including herbal products and OTC medications.   Medications Current Outpatient Medications  Medication Sig Dispense Refill   abemaciclib (VERZENIO) 100 MG tablet Take 1 tablet (100 mg total) by mouth 2 (two) times daily. 56 tablet 2   acetaminophen (TYLENOL) 500 MG tablet Take 500-1,000 mg by mouth every 6 (six) hours as needed for moderate pain.     Ascorbic Acid (VITAMIN C PO) Take 1 tablet by mouth daily. (Patient not taking: Reported on 09/05/2022)     cetirizine (ZYRTEC) 10 MG tablet Take 10 mg by mouth daily as needed for allergies. (Patient not taking: Reported on 09/05/2022)     letrozole (FEMARA) 2.5 MG tablet Take 1 tablet (2.5 mg total) by mouth daily. 90 tablet 3   loperamide (IMODIUM) 1 MG/5ML solution Take 2 mg by mouth as needed for diarrhea or loose stools.     loteprednol (LOTEMAX) 0.5 % ophthalmic suspension 3 drops 3 (three) times daily. (Patient not taking: Reported on 09/05/2022)     Multiple Vitamin (MULTIVITAMIN WITH MINERALS) TABS tablet Take 2 tablets by mouth daily.     Noni, Morinda citrifolia, (NONI JUICE) 3000 MG/30ML LIQD Take 60 mLs by mouth daily.     ondansetron (ZOFRAN) 8 MG tablet Take 1 tablet (8 mg total) by mouth every 8 (eight) hours as needed for nausea or vomiting. 30 tablet 2   prochlorperazine (COMPAZINE) 10 MG tablet Take 1 tablet (10 mg total) by mouth every 6 (six) hours as needed for nausea or vomiting. 30 tablet 2   No current facility-administered medications for this visit.    Drug-Drug Interactions (DDIs) DDIs were evaluated? Yes Significant DDIs? No  The patient was instructed to speak with their health care provider and/or the oral chemotherapy pharmacist before starting any new drug, including prescription or over the counter, natural / herbal products, or  vitamins.  Supportive Care Diarrhea: we reviewed that diarrhea is common with abemaciclib and confirmed that she does have loperamide (Imodium) at home.  We reviewed how to take this medication PRN Neutropenia: we discussed the importance of having a thermometer and what the Centers for Disease Control and Prevention (CDC)  considers a fever which is 100.4F (38C) or higher.  Gave patient 24/7 triage line to call if any fevers or symptoms ILD/Pneumonitis: we reviewed potential symptoms including cough, shortness, and fatigue.  Hepatotoxicity: NA VTE: reviewed signs of DVT such as leg swelling, redness, pain, or tenderness and signs of PE such as shortness of breath, rapid or irregular heartbeat, cough, chest pain, or lightheadedness Reviewed to take the medication every 12 hours (with food sometimes can be easier on the stomach) and to take it at the same time every day.  Nausea: we review how to take antiemetics (ondansetron and prochlorperazine) as needed for nausea.   Dosing Assessment Hepatic adjustments needed? No  Renal adjustments needed? No  Toxicity adjustments needed? No  The current dosing regimen is appropriate to continue at this time.  Follow-Up Plan RTC to see Dr. Lindi Wise on 10/03/2022 with labs and reassess right arm pain RTC to see Dr. Gilford Rile, PharmD, BCOP, CPP in one month. We will discuss increasing abemaciclib dose to '150mg'$  twice daily at this time.   Chelsea Wise participated in the discussion, expressed understanding, and voiced agreement with the above plan. All questions were answered to her satisfaction. The patient was advised to contact the clinic at [phone] with any questions or concerns prior to her return visit.   I spent 30 minutes assessing and educating the patient.  Karmen Stabs, RPH, 09/20/2022  10:29 AM   I, Raina Mina, RPH-CPP, have reviewed all documentation for this visit. The documentation on 09/20/22 for the exam,  diagnosis, procedures, and orders are all accurate and complete.   **Disclaimer: This note was dictated with voice recognition software. Similar sounding words can inadvertently be transcribed and this note may contain transcription errors which may not have been corrected upon publication of note.**

## 2022-09-29 NOTE — Progress Notes (Signed)
Patient Care Team: Sherlyn Hay, DO as PCP - General (Obstetrics and Gynecology) Eppie Gibson, MD as Consulting Physician (Radiation Oncology) Rolm Bookbinder, MD as Consulting Physician (General Surgery) Nicholas Lose, MD as Consulting Physician (Hematology and Oncology)  DIAGNOSIS: No diagnosis found.  SUMMARY OF ONCOLOGIC HISTORY: Oncology History  Malignant neoplasm of upper-inner quadrant of right breast in female, estrogen receptor positive ([place])  04/08/2021 Initial Diagnosis   Palpable right breast mass: Mammogram and US showed a 4.5cm right breast mass at the 12-1 o'clock position with associated microcalcifications, multiple additional masses from the 9-11 o'clock position in the right breast, calcifications in the subareolar right breast, 1.4cm, and two enlarged right axillary lymph nodes. Biopsy showed invasive and in situ ductal carcinoma in the breast and axilla, grade 2 ER 80%, PR 1%, Ki-67 25%, HER2 equivalent by [facility]   04/08/2021 Cancer Staging   Staging form: Breast, AJCC 8th Edition - Clinical stage from 04/08/2021: Stage IIA (cT2, cN1, cM0, G2, ER+, PR+, HER2-) - Signed by Nicholas Lose, MD on 04/13/2021 Stage prefix: Initial diagnosis Histologic grading system: 3 grade system   04/19/2021 Genetic Testing   Negative genetic testing:  No pathogenic variants detected on the [employer] BRCAplus or CancerNext-Expanded + RNAinsight panels. Two variants of uncertain significance (VUS) were detected - one in the APC gene called p.T2422I (c.7265C>T) and a second in the BAP1 gene called p.P302L (c.905C>T). The report dates are 04/19/2021 and 04/25/2021, respectively.  The BRCAplus panel offered by [employer] and includes sequencing and deletion/duplication analysis for the following 8 genes: ATM, BRCA1, BRCA2, CDH1, CHEK2, PALB2, PTEN, and TP53. The CancerNext-Expanded + RNAinsight gene panel offered by [employer] and includes sequencing and rearrangement analysis for the  following 77 genes: AIP, ALK, APC, ATM, AXIN2, BAP1, BARD1, BLM, BMPR1A, BRCA1, BRCA2, BRIP1, CDC73, CDH1, CDK4, CDKN1B, CDKN2A, CHEK2, CTNNA1, DICER1, FANCC, FH, FLCN, GALNT12, KIF1B, LZTR1, MAX, MEN1, MET, MLH1, MSH2, MSH3, MSH6, MUTYH, NBN, NF1, NF2, NTHL1, PALB2, PHOX2B, PMS2, POT1, PRKAR1A, PTCH1, PTEN, RAD51C, RAD51D, RB1, RECQL, RET, SDHA, SDHAF2, SDHB, SDHC, SDHD, SMAD4, SMARCA4, SMARCB1, SMARCE1, STK11, SUFU, TMEM127, TP53, TSC1, TSC2, VHL and XRCC2 (sequencing and deletion/duplication); EGFR, EGLN1, HOXB13, KIT, MITF, PDGFRA, POLD1 and POLE (sequencing only); EPCAM and GREM1 (deletion/duplication only). RNA data is routinely analyzed for use in variant interpretation for all genes.    05/10/2021 - 09/20/2021 Chemotherapy   Patient is on Treatment Plan : BREAST ADJUVANT DOSE DENSE AC q14d / PACLitaxel q7d     10/21/2021 Surgery   Bilateral mastectomies: Left breast benign.  Right breast showed 6 cm of invasive ductal carcinoma, grade 2, margins negative, 2 out of 7 lymph nodes were positive for macrometastases.  ER positive, PR negative, HER2 negative.   10/21/2021 Cancer Staging   Staging form: Breast, AJCC 8th Edition - Pathologic stage from 10/21/2021: No Stage Recommended (ypT3, pN1a, cM0, G2, ER+, PR-, HER2-) - Signed by Gardenia Phlegm, NP on 04/03/2022 Stage prefix: Post-therapy Histologic grading system: 3 grade system   11/30/2021 - 01/11/2022 Radiation Therapy   Site Technique Total Dose (Gy) Dose per Fx (Gy) Completed Fx Beam Energies  Chest Wall, Right: CW_R_IMN 3D 50/50 2 25/25 6X, 10X  Chest Wall, Right: CW_R_PAB_SCV 3D 50/50 2 25/25 6X, 10X  Chest Wall, Right: CW_R_Bst Electron 10/10 2 5/5 6E       CHIEF COMPLIANT: Follow-up right mass on Letrozole    INTERVAL HISTORY: Chelsea Wise is a 41 y.o with the above mentioned right breast mass currently  on letrozole. She presents to the clinic today for a follow-up.   ALLERGIES:  has No Known  Allergies.  MEDICATIONS:  Current Outpatient Medications  Medication Sig Dispense Refill   abemaciclib (VERZENIO) 100 MG tablet Take 1 tablet (100 mg total) by mouth 2 (two) times daily. 56 tablet 2   acetaminophen (TYLENOL) 500 MG tablet Take 500-1,000 mg by mouth every 6 (six) hours as needed for moderate pain.     Ascorbic Acid (VITAMIN C PO) Take 1 tablet by mouth daily. (Patient not taking: Reported on 09/05/2022)     cetirizine (ZYRTEC) 10 MG tablet Take 10 mg by mouth daily as needed for allergies. (Patient not taking: Reported on 09/05/2022)     letrozole (FEMARA) 2.5 MG tablet Take 1 tablet (2.5 mg total) by mouth daily. 90 tablet 3   loperamide (IMODIUM) 1 MG/5ML solution Take 2 mg by mouth as needed for diarrhea or loose stools.     loteprednol (LOTEMAX) 0.5 % ophthalmic suspension 3 drops 3 (three) times daily. (Patient not taking: Reported on 09/05/2022)     Multiple Vitamin (MULTIVITAMIN WITH MINERALS) TABS tablet Take 2 tablets by mouth daily.     Noni, Morinda citrifolia, (NONI JUICE) 3000 MG/30ML LIQD Take 60 mLs by mouth daily.     ondansetron (ZOFRAN) 8 MG tablet Take 1 tablet (8 mg total) by mouth every 8 (eight) hours as needed for nausea or vomiting. 30 tablet 2   prochlorperazine (COMPAZINE) 10 MG tablet Take 1 tablet (10 mg total) by mouth every 6 (six) hours as needed for nausea or vomiting. 30 tablet 2   No current facility-administered medications for this visit.    PHYSICAL EXAMINATION: ECOG PERFORMANCE STATUS: {CHL ONC ECOG PS:[phone]}  There were no vitals filed for this visit. There were no vitals filed for this visit.  BREAST:*** No palpable masses or nodules in either right or left breasts. No palpable axillary supraclavicular or infraclavicular adenopathy no breast tenderness or nipple discharge. (exam performed in the presence of a chaperone)  LABORATORY DATA:  I have reviewed the data as listed    Latest Ref Rng & Units 09/20/2022    9:36 AM  09/05/2022   10:35 AM 09/20/2021   11:23 AM  CMP  Glucose 70 - 99 mg/dL 68  89  93   BUN 6 - 20 mg/dL _0 Creatinine 0.44 - 1.00 mg/dL 1.06  0.93  0.70   Sodium 135 - 145 mmol/L 141  139  141   Potassium 3.5 - 5.1 mmol/L 3.7  3.7  3.8   Chloride 98 - 111 mmol/L 104  104  107   CO2 22 - 32 mmol/L _1 Calcium 8.9 - 10.3 mg/dL 9.6  9.4  9.5   Total Protein 6.5 - 8.1 g/dL 7.7  7.1  7.2   Total Bilirubin 0.3 - 1.2 mg/dL 0.4  0.4  0.3   Alkaline Phos 38 - 126 U/L 60  59  48   AST 15 - 41 U/L _2 ALT 0 - 44 U/L _3 Lab Results  Component Value Date   WBC 3.9 (L) 09/20/2022   HGB 12.5 09/20/2022   HCT 36.8 09/20/2022   MCV 92.2 09/20/2022   PLT 225 09/20/2022   NEUTROABS 2.1 09/20/2022    ASSESSMENT & PLAN:  No problem-specific Assessment & Plan notes found for this  encounter.    No orders of the defined types were placed in this encounter.  The patient has a good understanding of the overall plan. she agrees with it. she will call with any problems that may develop before the next visit here. Total time spent: 30 mins including face to face time and time spent for planning, charting and co-ordination of care   Suzzette Righter, [place] 09/29/22    I Gardiner Coins am scribing for Dr. Lindi Adie  ***

## 2022-10-03 ENCOUNTER — Inpatient Hospital Stay: Payer: Federal, State, Local not specified - PPO

## 2022-10-03 ENCOUNTER — Inpatient Hospital Stay (HOSPITAL_BASED_OUTPATIENT_CLINIC_OR_DEPARTMENT_OTHER): Payer: Federal, State, Local not specified - PPO | Admitting: Hematology and Oncology

## 2022-10-03 DIAGNOSIS — C50211 Malignant neoplasm of upper-inner quadrant of right female breast: Secondary | ICD-10-CM | POA: Diagnosis not present

## 2022-10-03 DIAGNOSIS — Z170 Estrogen receptor positive status [ER+]: Secondary | ICD-10-CM | POA: Diagnosis not present

## 2022-10-03 DIAGNOSIS — E222 Syndrome of inappropriate secretion of antidiuretic hormone: Secondary | ICD-10-CM | POA: Diagnosis not present

## 2022-10-03 LAB — CBC WITH DIFFERENTIAL (CANCER CENTER ONLY)
Abs Immature Granulocytes: 0 10*3/uL (ref 0.00–0.07)
Basophils Absolute: 0 10*3/uL (ref 0.0–0.1)
Basophils Relative: 1 %
Eosinophils Absolute: 0 10*3/uL (ref 0.0–0.5)
Eosinophils Relative: 1 %
HCT: 35.4 % — ABNORMAL LOW (ref 36.0–46.0)
Hemoglobin: 12.2 g/dL (ref 12.0–15.0)
Immature Granulocytes: 0 %
Lymphocytes Relative: 38 %
Lymphs Abs: 1.4 10*3/uL (ref 0.7–4.0)
MCH: 31.7 pg (ref 26.0–34.0)
MCHC: 34.5 g/dL (ref 30.0–36.0)
MCV: 91.9 fL (ref 80.0–100.0)
Monocytes Absolute: 0.2 10*3/uL (ref 0.1–1.0)
Monocytes Relative: 6 %
Neutro Abs: 2 10*3/uL (ref 1.7–7.7)
Neutrophils Relative %: 54 %
Platelet Count: 191 10*3/uL (ref 150–400)
RBC: 3.85 MIL/uL — ABNORMAL LOW (ref 3.87–5.11)
RDW: 14.6 % (ref 11.5–15.5)
WBC Count: 3.6 10*3/uL — ABNORMAL LOW (ref 4.0–10.5)
nRBC: 0 % (ref 0.0–0.2)

## 2022-10-03 LAB — CMP (CANCER CENTER ONLY)
ALT: 13 U/L (ref 0–44)
AST: 18 U/L (ref 15–41)
Albumin: 4.3 g/dL (ref 3.5–5.0)
Alkaline Phosphatase: 55 U/L (ref 38–126)
Anion gap: 6 (ref 5–15)
BUN: 10 mg/dL (ref 6–20)
CO2: 30 mmol/L (ref 22–32)
Calcium: 9.5 mg/dL (ref 8.9–10.3)
Chloride: 102 mmol/L (ref 98–111)
Creatinine: 0.96 mg/dL (ref 0.44–1.00)
GFR, Estimated: >60 mL/min (ref >60)
Glucose, Bld: 89 mg/dL (ref 70–99)
Potassium: 3.8 mmol/L (ref 3.5–5.1)
Sodium: 138 mmol/L (ref 135–145)
Total Bilirubin: 0.3 mg/dL (ref 0.3–1.2)
Total Protein: 7.6 g/dL (ref 6.5–8.1)

## 2022-10-03 NOTE — Assessment & Plan Note (Addendum)
04/08/2021:Palpable right breast mass: Mammogram and US showed a 4.5cm right breast mass at the 12-1 o'clock position with associated microcalcifications, multiple additional masses from the 9-11 o'clock position in the right breast, calcifications in the subareolar right breast, 1.4cm, and two enlarged right axillary lymph nodes. 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 Neoadjuvant chemotherapy with dose dense Adriamycin and Cytoxan x4 followed by Taxol weekly x12completed 09/14/21 2.Bilateralmastectomieswith targeted node dissection 10/21/21 Left: Benign Right: 6 cm residual IDC 2/7 LN Positive ER: 80%, PR 1%, Her 2 Neg 3. Adjuvant radiation12/29/2022-01/11/2022 4.  Oophorectomy 06/12/2022  ---------------------------------------------------------------------------------------------------------------------- Current treatment: Letrozole started3/12/2021.  Verzinio started 08/19/2022  Letrozole toxicities: Tolerating letrozole extremely well without any problems or concerns.  Abemaciclib toxicities: Alternating diarrhea with constipation Mild leukopenia: Monitoring closely  Breast cancer surveillance: 1.Breast exam 06/27/2022: Benign 2.mammogram: No role of imaging since she had bilateral mastectomies  Return to clinic in 1 month to see Jenny Reichmann and after that we will see her every 2 months.

## 2022-10-05 ENCOUNTER — Telehealth: Payer: Self-pay | Admitting: Hematology and Oncology

## 2022-10-05 NOTE — Telephone Encounter (Signed)
Scheduled appointment per 10/31 los. Patient is aware.

## 2022-10-18 ENCOUNTER — Inpatient Hospital Stay: Payer: Federal, State, Local not specified - PPO | Attending: Adult Health

## 2022-10-18 ENCOUNTER — Inpatient Hospital Stay: Payer: Federal, State, Local not specified - PPO | Admitting: Pharmacist

## 2022-10-18 VITALS — BP 125/78 | HR 60 | Temp 97.8°F | Resp 18 | Ht 63.5 in | Wt 139.5 lb

## 2022-10-18 DIAGNOSIS — Z170 Estrogen receptor positive status [ER+]: Secondary | ICD-10-CM | POA: Diagnosis not present

## 2022-10-18 DIAGNOSIS — C50211 Malignant neoplasm of upper-inner quadrant of right female breast: Secondary | ICD-10-CM | POA: Insufficient documentation

## 2022-10-18 DIAGNOSIS — Z79811 Long term (current) use of aromatase inhibitors: Secondary | ICD-10-CM | POA: Insufficient documentation

## 2022-10-18 LAB — CBC WITH DIFFERENTIAL (CANCER CENTER ONLY)
Abs Immature Granulocytes: 0.01 10*3/uL (ref 0.00–0.07)
Basophils Absolute: 0 10*3/uL (ref 0.0–0.1)
Basophils Relative: 1 %
Eosinophils Absolute: 0 10*3/uL (ref 0.0–0.5)
Eosinophils Relative: 1 %
HCT: 36.6 % (ref 36.0–46.0)
Hemoglobin: 12.7 g/dL (ref 12.0–15.0)
Immature Granulocytes: 0 %
Lymphocytes Relative: 39 %
Lymphs Abs: 1.5 10*3/uL (ref 0.7–4.0)
MCH: 32.3 pg (ref 26.0–34.0)
MCHC: 34.7 g/dL (ref 30.0–36.0)
MCV: 93.1 fL (ref 80.0–100.0)
Monocytes Absolute: 0.3 10*3/uL (ref 0.1–1.0)
Monocytes Relative: 7 %
Neutro Abs: 2 10*3/uL (ref 1.7–7.7)
Neutrophils Relative %: 52 %
Platelet Count: 231 10*3/uL (ref 150–400)
RBC: 3.93 MIL/uL (ref 3.87–5.11)
RDW: 15.3 % (ref 11.5–15.5)
WBC Count: 3.8 10*3/uL — ABNORMAL LOW (ref 4.0–10.5)
nRBC: 0 % (ref 0.0–0.2)

## 2022-10-18 LAB — CMP (CANCER CENTER ONLY)
ALT: 13 U/L (ref 0–44)
AST: 18 U/L (ref 15–41)
Albumin: 4.7 g/dL (ref 3.5–5.0)
Alkaline Phosphatase: 54 U/L (ref 38–126)
Anion gap: 5 (ref 5–15)
BUN: 12 mg/dL (ref 6–20)
CO2: 30 mmol/L (ref 22–32)
Calcium: 10.2 mg/dL (ref 8.9–10.3)
Chloride: 102 mmol/L (ref 98–111)
Creatinine: 0.98 mg/dL (ref 0.44–1.00)
GFR, Estimated: >60 mL/min (ref >60)
Glucose, Bld: 86 mg/dL (ref 70–99)
Potassium: 3.9 mmol/L (ref 3.5–5.1)
Sodium: 137 mmol/L (ref 135–145)
Total Bilirubin: 0.3 mg/dL (ref 0.3–1.2)
Total Protein: 8.1 g/dL (ref 6.5–8.1)

## 2022-10-18 NOTE — Progress Notes (Signed)
Chelsea Wise       Telephone: [phone]?Fax: [phone]   Oncology Clinical Pharmacist Practitioner Progress Note  [facility] [place]       Telephone: [phone]?Fax: [phone]    Oncology Clinical Pharmacist Practitioner Progress Note   Chelsea Wise was contacted via in-person to discuss her chemotherapy regimen for abemaciclib which they receive under the care of Dr. Nicholas Lose.   Current treatment regimen and start date Abemaciclib (08/19/2022) Letrozole (02/01/2022)   Interval History She continues on abemaciclib 100 mg by mouth every 12 hours on days 1 to 28 of a 28-day cycle. This is being given in combination with letrozole 2.'5mg'$  by mouth daily . Therapy is planned to continue until  until 2 years in the adjuvant setting per the monarchE trial. She last saw clinical pharmacy on 09/20/22 and last saw Dr. Lindi Wise on 10/03/22.  The right arm pain that she was having at had resolved until yesterday.  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  She feels she tweaked it when maneuvering the steering wheel.  She next sees physical therapy on 10/30/22 and will continue to monitor this area closely.    Response to Therapy [name] is tolerating abemaciclib fairly well.  Her only main concern is the continued loose stools.  She is having loose stool episodes about 3 times a week and during those episodes she is going an estimated 2 times per day.  During our discussion today, we discovered that she is not taking enough liquid loperamide at her first loose stool.  Because this is a 1 mg / 7.5 mL concentration, she could take up to 30 mL with her first loose stool and then 15 mL with each subsequent loose stool, up to 16 mg in a 24-hour period.  We reviewed this with her today as she prefers to take the liquid due to it tasting better per her opinion rather than the tablets.  Also  reviewed that each tablet of loperamide is 2 mg.  She verbalized understanding of the plan.  She is also having some gas and bloating.  We recommended that she try over-the-counter simethicone (Gas-X).  She says she would consider this.  She is not experiencing any other side effects from the abemaciclib.  Her labs continue to remain within treatment parameters.  Per Dr. Geralyn Flash last visit last visit, we were planning on seeing Chelsea Wise every 2 months after today's visit.  But after speaking with Chelsea Wise, she would prefer to be seen next month by clinical pharmacy and we will schedule this lab and appointment per her request.  She does have a visit already scheduled with Dr. Lindi Wise on 12/26/22 and we will add labs to this visit.  We will discuss increasing the dose of abemaciclib had a later time if her GI toxicities become better managed.  She is not taking vitamin D3 which has been added to her med list.  Her calcium levels were elevated today but still within normal limits.  We will continue to monitor.  She continues to prefer to work from home due to the increased risk of infection from abemaciclib.  We assisted her with some paperwork today, Dr. Lindi Wise reviewed and signed.  Labs, vitals, treatment parameters, and manufacturer guidelines assessing toxicity were reviewed with Chelsea Wise today. Based on these values, patient is in agreement to continue abemaciclib therapy at  this time.  Allergies No Known Allergies  Vitals    10/18/2022   11:00 AM 10/03/2022    2:13 PM 09/20/2022   10:02 AM  Oncology Vitals  Height 161 cm 161 cm 161 cm  Weight 63.277 kg 64.91 kg 63.685 kg  Weight (lbs) 139 lbs 8 oz 143 lbs 2 oz 140 lbs 6 oz  BMI 24.32 kg/m2   24.32 kg/m2 24.95 kg/m2   24.95 kg/m2 24.48 kg/m2   24.48 kg/m2  Temp 97.8 F (36.6 C) 97.8 F (36.6 C) 97.8 F (36.6 C)  Pulse Rate 60 67 70  BP 125/78 131/59 138/83  Resp '18 18 18  '$ SpO2 100 % 100 % 100 %  BSA (m2) 1.68 m2    1.68 m2 1.71 m2   1.71 m2 1.69 m2   1.69 m2    Laboratory Data    Latest Ref Rng & Units 10/18/2022   10:51 AM 10/03/2022    2:00 PM 09/20/2022    9:36 AM  CBC EXTENDED  WBC 4.0 - 10.5 K/uL 3.8  3.6  3.9   RBC 3.87 - 5.11 MIL/uL 3.93  3.85  3.99   Hemoglobin 12.0 - 15.0 g/dL 12.7  12.2  12.5   HCT 36.0 - 46.0 % 36.6  35.4  36.8   Platelets 150 - 400 K/uL 231  191  225   NEUT# 1.7 - 7.7 K/uL 2.0  2.0  2.1   Lymph# 0.7 - 4.0 K/uL 1.5  1.4  1.5        Latest Ref Rng & Units 10/18/2022   10:51 AM 10/03/2022    2:00 PM 09/20/2022    9:36 AM  CMP  Glucose 70 - 99 mg/dL 86  89  68   BUN 6 - 20 mg/dL '12  10  10   '$ Creatinine 0.44 - 1.00 mg/dL 0.98  0.96  1.06   Sodium 135 - 145 mmol/L 137  138  141   Potassium 3.5 - 5.1 mmol/L 3.9  3.8  3.7   Chloride 98 - 111 mmol/L 102  102  104   CO2 22 - 32 mmol/L '30  30  30   '$ Calcium 8.9 - 10.3 mg/dL 10.2  9.5  9.6   Total Protein 6.5 - 8.1 g/dL 8.1  7.6  7.7   Total Bilirubin 0.3 - 1.2 mg/dL 0.3  0.3  0.4   Alkaline Phos 38 - 126 U/L 54  55  60   AST 15 - 41 U/L '18  18  19   '$ ALT 0 - 44 U/L '13  13  11    '$ Adverse Effects Assessment Diarrhea: As above, was using loperamide liquid and tablets.  We reviewed the concentrations of each of these dosing regimens and how to take these different dosage forms while on abemaciclib.  Adherence Assessment Chelsea Wise reports missing 0 doses over the past 2 weeks.   Reason for missed dose: N/A Patient was re-educated on importance of adherence.   Access Assessment Chelsea Wise is currently receiving her abemaciclib through [facility] Insurance concerns: None  Medication Reconciliation The patient's medication list was reviewed today with the patient?  Yes New medications or herbal supplements have recently been started?  Yes, vitamin D3 1000 international units daily has been added to her medication list. Any medications have been discontinued?  No The medication list  was updated and reconciled based on the patient's most recent medication list in the electronic medical record (EMR)  including herbal products and OTC medications.   Medications Current Outpatient Medications  Medication Sig Dispense Refill   abemaciclib (VERZENIO) 100 MG tablet Take 1 tablet (100 mg total) by mouth 2 (two) times daily. 56 tablet 2   Cholecalciferol (VITAMIN D3) 25 MCG (1000 UT) CAPS Take 1,000 Int'l Units by mouth daily.     acetaminophen (TYLENOL) 500 MG tablet Take 500-1,000 mg by mouth every 6 (six) hours as needed for moderate pain.     Ascorbic Acid (VITAMIN C PO) Take 1 tablet by mouth daily. (Patient not taking: Reported on 09/05/2022)     cetirizine (ZYRTEC) 10 MG tablet Take 10 mg by mouth daily as needed for allergies. (Patient not taking: Reported on 09/05/2022)     letrozole (FEMARA) 2.5 MG tablet Take 1 tablet (2.5 mg total) by mouth daily. 90 tablet 3   loperamide (IMODIUM) 1 MG/5ML solution Take 2 mg by mouth as needed for diarrhea or loose stools.     loteprednol (LOTEMAX) 0.5 % ophthalmic suspension 3 drops 3 (three) times daily. (Patient not taking: Reported on 09/05/2022)     Multiple Vitamin (MULTIVITAMIN WITH MINERALS) TABS tablet Take 2 tablets by mouth daily.     Noni, Morinda citrifolia, (NONI JUICE) 3000 MG/30ML LIQD Take 60 mLs by mouth daily.     ondansetron (ZOFRAN) 8 MG tablet Take 1 tablet (8 mg total) by mouth every 8 (eight) hours as needed for nausea or vomiting. 30 tablet 2   prochlorperazine (COMPAZINE) 10 MG tablet Take 1 tablet (10 mg total) by mouth every 6 (six) hours as needed for nausea or vomiting. 30 tablet 2   No current facility-administered medications for this visit.    Drug-Drug Interactions (DDIs) DDIs were evaluated?  Yes Significant DDIs?  No The patient was instructed to speak with their health care provider and/or the oral chemotherapy pharmacist before starting any new drug, including prescription or over the counter,  natural / herbal products, or vitamins.  Supportive Care Diarrhea: we reviewed that diarrhea is common with abemaciclib and confirmed that she does have loperamide (Imodium) at home.  We reviewed how to take this medication PRN. Neutropenia: we discussed the importance of having a thermometer and what the Centers for Disease Control and Prevention (CDC) considers a fever which is 100.11F (38C) or higher.  Gave patient 24/7 triage line to call if any fevers or symptoms. ILD/Pneumonitis: we reviewed potential symptoms including cough, shortness, and fatigue.  VTE: reviewed signs of DVT such as leg swelling, redness, pain, or tenderness and signs of PE such as shortness of breath, rapid or irregular heartbeat, cough, chest pain, or lightheadedness. Reviewed to take the medication every 12 hours (with food sometimes can be easier on the stomach) and to take it at the same time every day.  Dosing Assessment Hepatic adjustments needed?  No Renal adjustments needed?  No Toxicity adjustments needed?  No The current dosing regimen is appropriate to continue at this time.  Follow-Up Plan Continue abemaciclib 100 mg by mouth every 12 hours.  No increase in dose at this time due to continued loose stool. Continue letrozole 2.5 mg by mouth daily Continue loperamide as needed for loose stool.  Reviewed concentration of liquid dosage form and tablet dosage form.  We did discuss that if this continues to be a problem, diphenoxylate/atropine can be prescribed Monitor calcium.  Still WNL but has started vitamin D3 Monitor right arm pain. Had gotten better but tweaked it again while driving. She sees PT  on 10/30/22 Will add labs, pharmacy clinic visit, in 4 weeks per patient request for toxicity assessment Ms. Canning has a visit scheduled with Dr. Lindi Wise on 12/26/22.  We will add labs to this visit.  Chelsea Dys Behe participated in the discussion, expressed understanding, and voiced agreement with the above  plan. All questions were answered to her satisfaction. The patient was advised to contact the clinic at [phone] with any questions or concerns prior to her return visit.   I spent 30 minutes assessing and educating the patient.  Raina Mina, RPH-CPP, 10/18/2022  11:48 AM   **Disclaimer: This note was dictated with voice recognition software. Similar sounding words can inadvertently be transcribed and this note may contain transcription errors which may not have been corrected upon publication of note.**

## 2022-10-30 ENCOUNTER — Ambulatory Visit: Payer: Federal, State, Local not specified - PPO | Attending: General Surgery

## 2022-10-30 VITALS — Wt 136.1 lb

## 2022-10-30 DIAGNOSIS — Z483 Aftercare following surgery for neoplasm: Secondary | ICD-10-CM | POA: Insufficient documentation

## 2022-10-30 NOTE — Therapy (Signed)
OUTPATIENT PHYSICAL THERAPY SOZO SCREENING NOTE   Patient Name: Chelsea Wise MRN: [id number] DOB:12/04/1981, 41 y.o., female Today's Date: 10/30/2022  PCP: Sherlyn Hay, DO REFERRING PROVIDER: Rolm Bookbinder, MD   PT End of Session - 10/30/22 0956     Visit Number 10   # unchanged due to screen only   PT Start Time 0923    PT Stop Time 1002    PT Time Calculation (min) 7 min    Activity Tolerance Patient tolerated treatment well    Behavior During Therapy [facility] for tasks assessed/performed             Past Medical History:  Diagnosis Date   Family history of breast cancer    Family history of lung cancer    Family history of pancreatic cancer    Family history of prostate cancer    History of cancer chemotherapy    right breast cancer  05-10-2021  to 09-20-2021   History of external beam radiation therapy    right breast 12-01-2021 to 01-11-2022   Irritation of right eye 06/01/2022   Malignant neoplasm of upper-inner quadrant of right breast in female, estrogen receptor positive ([place]) 04/08/2021   oncologist--- dr Lindi Adie;  Stage IIA,  IDC/  DCIS;  completed chemo 09-20-2021 , completed radiation 01-11-2022   Wears contact lenses    Past Surgical History:  Procedure Laterality Date   LAPAROSCOPIC BILATERAL SALPINGO OOPHERECTOMY Bilateral 06/12/2022   Procedure: LAPAROSCOPIC BILATERAL SALPINGO OOPHORECTOMY;  Surgeon: Sherlyn Hay, DO;  Location: [place];  Service: Gynecology;  Laterality: Bilateral;   MASTECTOMY W/ SENTINEL NODE BIOPSY Right 10/21/2021   Procedure: RIGHT MASTECTOMY WITH RIGHT AXILLARY SENTINEL LYMPH NODE BIOPSY;  Surgeon: Rolm Bookbinder, MD;  Location: [place];  Service: General;  Laterality: Right;   PORT-A-CATH REMOVAL Left 10/21/2021   Procedure: REMOVAL PORT-A-CATH;  Surgeon: Rolm Bookbinder, MD;  Location: [place];  Service: General;  Laterality: Left;    PORTACATH PLACEMENT Left 05/09/2021   Procedure: INSERTION PORT-A-CATH;  Surgeon: Rolm Bookbinder, MD;  Location: [place];  Service: General;  Laterality: Left;   RADIOACTIVE SEED GUIDED AXILLARY SENTINEL LYMPH NODE Right 10/21/2021   Procedure: RADIOACTIVE SEED GUIDED RIGHT AXILLARY LYMPH NODE EXCISION;  Surgeon: Rolm Bookbinder, MD;  Location: [place];  Service: General;  Laterality: Right;   TOTAL MASTECTOMY Left 10/21/2021   Procedure: LEFT TOTAL MASTECTOMY;  Surgeon: Rolm Bookbinder, MD;  Location: [place];  Service: General;  Laterality: Left;   [facility] EXTRACTION     Patient Active Problem List   Diagnosis Date Noted   S/P bilateral mastectomy 10/21/2021   Genetic testing 04/19/2021   Malignant neoplasm of upper-inner quadrant of right breast in female, estrogen receptor positive ([place]) 04/13/2021   Family history of prostate cancer    Family history of pancreatic cancer    Family history of breast cancer    Family history of lung cancer    Active labor 03/15/2016   Postpartum care following vaginal delivery 03/15/2016   SVD (spontaneous vaginal delivery) 01/18/2014   Labor and delivery, indication for care 01/17/2014   Antepartum bleeding, third trimester 01/01/2014   Placenta previa in second trimester 11/28/2013    REFERRING DIAG: right breast cancer at risk for lymphedema  THERAPY DIAG: Aftercare following surgery for neoplasm  PERTINENT HISTORY: Patient was diagnosed on 04/11/2021 with right grade II invasive ductal carcinoma breast cancer. It measures 4.5 cm and is located  in the upper inner quadrant. It is ER positive, weakly PR positive, and HER2 equivocal. Ki67 is 25%. She has 2 abnormal appearing axillary lymph nodes. One was biopsied and found to be positive.   PRECAUTIONS: right UE Lymphedema risk, None  SUBJECTIVE: Pt returns for her 3 month L-Dex screen.   PAIN:  Are you having pain? No  SOZO SCREENING: Patient  was assessed today using the SOZO machine to determine the lymphedema index score. This was compared to her baseline score. It was determined that she is within the recommended range when compared to her baseline and no further action is needed at this time. She will continue SOZO screenings. These are done every 3 months for 2 years post operatively followed by every 6 months for 2 years, and then annually.   Since pts change from baseline is almost to subclinical lymphedema encouraged her to wear her compression sleeve when exercising and pt agreed.   L-DEX FLOWSHEETS - 10/30/22 1000       L-DEX LYMPHEDEMA SCREENING   Measurement Type Unilateral    L-DEX MEASUREMENT EXTREMITY Upper Extremity    POSITION  Standing    DOMINANT SIDE Right    At Risk Side Right    BASELINE SCORE (UNILATERAL) 1.6    L-DEX SCORE (UNILATERAL) 7.8    VALUE CHANGE (UNILAT) 6.2              Otelia Limes, PTA 10/30/2022, 10:02 AM

## 2022-11-13 ENCOUNTER — Telehealth: Payer: Self-pay

## 2022-11-13 NOTE — Telephone Encounter (Signed)
Pt called and states she tested positive for COVID. Denies fever, ShOB, sore throat. Reports only sx are h/a and nasal congestion. Recommended immune support with daily doses of Vitamin C 1000 mg, Zinc 50 mg, and Vitamin D3 5,000 units OTC. She knows she can take Tylenol and ibuprofen as needed as well. She was advised to hold verzenio until sx subside. Pt knows to call if sx worsen or if she develops a fever.

## 2022-11-14 ENCOUNTER — Other Ambulatory Visit: Payer: Self-pay | Admitting: Hematology and Oncology

## 2022-11-16 ENCOUNTER — Inpatient Hospital Stay: Payer: Federal, State, Local not specified - PPO | Admitting: Pharmacist

## 2022-11-16 ENCOUNTER — Inpatient Hospital Stay: Payer: Federal, State, Local not specified - PPO

## 2022-11-17 ENCOUNTER — Telehealth: Payer: Self-pay

## 2022-11-17 NOTE — Telephone Encounter (Signed)
Notified Patient of completion of FMLA forms. Fax transmission confirmation received. Copy of forms mailed to Patient as requested. No other needs or concerns voiced at this time. 

## 2022-12-26 ENCOUNTER — Other Ambulatory Visit: Payer: Self-pay

## 2022-12-26 ENCOUNTER — Inpatient Hospital Stay: Payer: Federal, State, Local not specified - PPO

## 2022-12-26 ENCOUNTER — Inpatient Hospital Stay: Payer: Federal, State, Local not specified - PPO | Attending: Adult Health | Admitting: Hematology and Oncology

## 2022-12-26 VITALS — BP 135/79 | HR 61 | Temp 97.2°F | Resp 16 | Wt 139.0 lb

## 2022-12-26 DIAGNOSIS — Z79811 Long term (current) use of aromatase inhibitors: Secondary | ICD-10-CM | POA: Diagnosis not present

## 2022-12-26 DIAGNOSIS — D72819 Decreased white blood cell count, unspecified: Secondary | ICD-10-CM | POA: Insufficient documentation

## 2022-12-26 DIAGNOSIS — Z170 Estrogen receptor positive status [ER+]: Secondary | ICD-10-CM | POA: Diagnosis not present

## 2022-12-26 DIAGNOSIS — C50211 Malignant neoplasm of upper-inner quadrant of right female breast: Secondary | ICD-10-CM

## 2022-12-26 DIAGNOSIS — Z9013 Acquired absence of bilateral breasts and nipples: Secondary | ICD-10-CM | POA: Insufficient documentation

## 2022-12-26 LAB — CMP (CANCER CENTER ONLY)
ALT: 10 U/L (ref 0–44)
AST: 17 U/L (ref 15–41)
Albumin: 4.2 g/dL (ref 3.5–5.0)
Alkaline Phosphatase: 60 U/L (ref 38–126)
Anion gap: 5 (ref 5–15)
BUN: 13 mg/dL (ref 6–20)
CO2: 30 mmol/L (ref 22–32)
Calcium: 9.7 mg/dL (ref 8.9–10.3)
Chloride: 102 mmol/L (ref 98–111)
Creatinine: 0.98 mg/dL (ref 0.44–1.00)
GFR, Estimated: >60 mL/min (ref >60)
Glucose, Bld: 78 mg/dL (ref 70–99)
Potassium: 3.7 mmol/L (ref 3.5–5.1)
Sodium: 137 mmol/L (ref 135–145)
Total Bilirubin: 0.4 mg/dL (ref 0.3–1.2)
Total Protein: 7.2 g/dL (ref 6.5–8.1)

## 2022-12-26 LAB — CBC WITH DIFFERENTIAL (CANCER CENTER ONLY)
Abs Immature Granulocytes: 0 10*3/uL (ref 0.00–0.07)
Basophils Absolute: 0 10*3/uL (ref 0.0–0.1)
Basophils Relative: 0 %
Eosinophils Absolute: 0 10*3/uL (ref 0.0–0.5)
Eosinophils Relative: 0 %
HCT: 35.8 % — ABNORMAL LOW (ref 36.0–46.0)
Hemoglobin: 12.3 g/dL (ref 12.0–15.0)
Immature Granulocytes: 0 %
Lymphocytes Relative: 41 %
Lymphs Abs: 1.7 10*3/uL (ref 0.7–4.0)
MCH: 32.5 pg (ref 26.0–34.0)
MCHC: 34.4 g/dL (ref 30.0–36.0)
MCV: 94.7 fL (ref 80.0–100.0)
Monocytes Absolute: 0.2 10*3/uL (ref 0.1–1.0)
Monocytes Relative: 5 %
Neutro Abs: 2.2 10*3/uL (ref 1.7–7.7)
Neutrophils Relative %: 54 %
Platelet Count: 183 10*3/uL (ref 150–400)
RBC: 3.78 MIL/uL — ABNORMAL LOW (ref 3.87–5.11)
RDW: 12.8 % (ref 11.5–15.5)
WBC Count: 4.1 10*3/uL (ref 4.0–10.5)
nRBC: 0 % (ref 0.0–0.2)

## 2022-12-26 NOTE — Progress Notes (Signed)
Patient Care Team: Sherlyn Hay, DO as PCP - General (Obstetrics and Gynecology) Eppie Gibson, MD as Consulting Physician (Radiation Oncology) Rolm Bookbinder, MD as Consulting Physician (General Surgery) Nicholas Lose, MD as Consulting Physician (Hematology and Oncology) Raina Mina, RPH-CPP as Pharmacist (Hematology and Oncology)  DIAGNOSIS:  Encounter Diagnosis  Name Primary?   Malignant neoplasm of upper-inner quadrant of right breast in female, estrogen receptor positive ([place]) Yes    SUMMARY OF ONCOLOGIC HISTORY: Oncology History  Malignant neoplasm of upper-inner quadrant of right breast in female, estrogen receptor positive ([place])  04/08/2021 Initial Diagnosis   Palpable right breast mass: Mammogram and US showed a 4.5cm right breast mass at the 12-1 o'clock position with associated microcalcifications, multiple additional masses from the 9-11 o'clock position in the right breast, calcifications in the subareolar right breast, 1.4cm, and two enlarged right axillary lymph nodes. Biopsy showed invasive and in situ ductal carcinoma in the breast and axilla, grade 2 ER 80%, PR 1%, Ki-67 25%, HER2 equivalent by [facility]   04/08/2021 Cancer Staging   Staging form: Breast, AJCC 8th Edition - Clinical stage from 04/08/2021: Stage IIA (cT2, cN1, cM0, G2, ER+, PR+, HER2-) - Signed by Nicholas Lose, MD on 04/13/2021 Stage prefix: Initial diagnosis Histologic grading system: 3 grade system   04/19/2021 Genetic Testing   Negative genetic testing:  No pathogenic variants detected on the [employer] BRCAplus or CancerNext-Expanded + RNAinsight panels. Two variants of uncertain significance (VUS) were detected - one in the APC gene called p.T2422I (c.7265C>T) and a second in the BAP1 gene called p.P302L (c.905C>T). The report dates are 04/19/2021 and 04/25/2021, respectively.  The BRCAplus panel offered by [employer] and includes sequencing and deletion/duplication analysis for the following 8  genes: ATM, BRCA1, BRCA2, CDH1, CHEK2, PALB2, PTEN, and TP53. The CancerNext-Expanded + RNAinsight gene panel offered by [employer] and includes sequencing and rearrangement analysis for the following 77 genes: AIP, ALK, APC, ATM, AXIN2, BAP1, BARD1, BLM, BMPR1A, BRCA1, BRCA2, BRIP1, CDC73, CDH1, CDK4, CDKN1B, CDKN2A, CHEK2, CTNNA1, DICER1, FANCC, FH, FLCN, GALNT12, KIF1B, LZTR1, MAX, MEN1, MET, MLH1, MSH2, MSH3, MSH6, MUTYH, NBN, NF1, NF2, NTHL1, PALB2, PHOX2B, PMS2, POT1, PRKAR1A, PTCH1, PTEN, RAD51C, RAD51D, RB1, RECQL, RET, SDHA, SDHAF2, SDHB, SDHC, SDHD, SMAD4, SMARCA4, SMARCB1, SMARCE1, STK11, SUFU, TMEM127, TP53, TSC1, TSC2, VHL and XRCC2 (sequencing and deletion/duplication); EGFR, EGLN1, HOXB13, KIT, MITF, PDGFRA, POLD1 and POLE (sequencing only); EPCAM and GREM1 (deletion/duplication only). RNA data is routinely analyzed for use in variant interpretation for all genes.    05/10/2021 - 09/20/2021 Chemotherapy   Patient is on Treatment Plan : BREAST ADJUVANT DOSE DENSE AC q14d / PACLitaxel q7d     10/21/2021 Surgery   Bilateral mastectomies: Left breast benign.  Right breast showed 6 cm of invasive ductal carcinoma, grade 2, margins negative, 2 out of 7 lymph nodes were positive for macrometastases.  ER positive, PR negative, HER2 negative.   10/21/2021 Cancer Staging   Staging form: Breast, AJCC 8th Edition - Pathologic stage from 10/21/2021: No Stage Recommended (ypT3, pN1a, cM0, G2, ER+, PR-, HER2-) - Signed by Gardenia Phlegm, NP on 04/03/2022 Stage prefix: Post-therapy Histologic grading system: 3 grade system   11/30/2021 - 01/11/2022 Radiation Therapy   Site Technique Total Dose (Gy) Dose per Fx (Gy) Completed Fx Beam Energies  Chest Wall, Right: CW_R_IMN 3D 50/50 2 25/25 6X, 10X  Chest Wall, Right: CW_R_PAB_SCV 3D 50/50 2 25/25 6X, 10X  Chest Wall, Right: CW_R_Bst Electron 10/10 2 5/5 6E  CHIEF COMPLIANT: Follow-up right mass on Letrozole and Verzenio   INTERVAL  HISTORY: Chelsea Wise is a 42 y.o with the above mentioned right breast mass currently on letrozole and Verzenio. She presents to the clinic today for a follow-up. She reports that she started back on the [facility] January 8, because she had Covid. She says she is tolerating it well. She says she had diarrhea 1 time. She complains of back pain that started a week ago. She says it's a mild pain.   ALLERGIES:  has No Known Allergies.  MEDICATIONS:  Current Outpatient Medications  Medication Sig Dispense Refill   acetaminophen (TYLENOL) 500 MG tablet Take 500-1,000 mg by mouth every 6 (six) hours as needed for moderate pain.     Cholecalciferol (VITAMIN D3) 25 MCG (1000 UT) CAPS Take 1,000 Int'l Units by mouth daily.     letrozole (FEMARA) 2.5 MG tablet Take 1 tablet (2.5 mg total) by mouth daily. 90 tablet 3   loperamide (IMODIUM) 1 MG/5ML solution Take 2 mg by mouth as needed for diarrhea or loose stools.     loteprednol (LOTEMAX) 0.5 % ophthalmic suspension 3 drops 3 (three) times daily. (Patient not taking: Reported on 09/05/2022)     Multiple Vitamin (MULTIVITAMIN WITH MINERALS) TABS tablet Take 2 tablets by mouth daily.     Noni, [place]inda citrifolia, (NONI JUICE) 3000 MG/30ML LIQD Take 60 mLs by mouth daily.     ondansetron (ZOFRAN) 8 MG tablet Take 1 tablet (8 mg total) by mouth every 8 (eight) hours as needed for nausea or vomiting. 30 tablet 2   prochlorperazine (COMPAZINE) 10 MG tablet Take 1 tablet (10 mg total) by mouth every 6 (six) hours as needed for nausea or vomiting. 30 tablet 2   VERZENIO 100 MG tablet TAKE 1 TABLET BY MOUTH 2 TIMES A DAY 56 tablet 2   No current facility-administered medications for this visit.    PHYSICAL EXAMINATION: ECOG PERFORMANCE STATUS: 1 - Symptomatic but completely ambulatory  Vitals:   12/26/22 1204  BP: 135/79  Pulse: 61  Resp: 16  Temp: (!) 97.2 F (36.2 C)  SpO2: 100%   Filed Weights   12/26/22 1204  Weight: 139 lb (63 kg)     BREAST: No palpable masses or nodules in either right or left breasts.  . (exam performed in the presence of a chaperone)  LABORATORY DATA:  I have reviewed the data as listed    Latest Ref Rng & Units 12/26/2022   11:24 AM 10/18/2022   10:51 AM 10/03/2022    2:00 PM  CMP  Glucose 70 - 99 mg/dL 78  86  89   BUN 6 - 20 mg/dL '13  12  10   '$ Creatinine 0.44 - 1.00 mg/dL 0.98  0.98  0.96   Sodium 135 - 145 mmol/L 137  137  138   Potassium 3.5 - 5.1 mmol/L 3.7  3.9  3.8   Chloride 98 - 111 mmol/L 102  102  102   CO2 22 - 32 mmol/L '30  30  30   '$ Calcium 8.9 - 10.3 mg/dL 9.7  10.2  9.5   Total Protein 6.5 - 8.1 g/dL 7.2  8.1  7.6   Total Bilirubin 0.3 - 1.2 mg/dL 0.4  0.3  0.3   Alkaline Phos 38 - 126 U/L 60  54  55   AST 15 - 41 U/L '17  18  18   '$ ALT 0 - 44 U/L 10  13  13     Lab Results  Component Value Date   WBC 4.1 12/26/2022   HGB 12.3 12/26/2022   HCT 35.8 (L) 12/26/2022   MCV 94.7 12/26/2022   PLT 183 12/26/2022   NEUTROABS 2.2 12/26/2022    ASSESSMENT & PLAN:  Malignant neoplasm of upper-inner quadrant of right breast in female, estrogen receptor positive ([place]) 04/08/2021:Palpable right breast mass: Mammogram and US showed a 4.5cm right breast mass at the 12-1 o'clock position with associated microcalcifications, multiple additional masses from the 9-11 o'clock position in the right breast, calcifications in the subareolar right breast, 1.4cm, and two enlarged right axillary lymph nodes. 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  Neoadjuvant chemotherapy with dose dense Adriamycin and Cytoxan x4 followed by Taxol weekly x12 completed 09/14/21 2. Bilateral mastectomies with targeted node dissection  10/21/21 Left: Benign Right: 6 cm residual IDC 2/7 LN Positive ER: 80%, PR 1%, Her 2 Neg 3.  Adjuvant radiation 12/01/2021-01/11/2022 4.  Oophorectomy 06/12/2022  ---------------------------------------------------------------------------------------------------------------------- Current treatment: Letrozole started 02/01/2022.  Verzinio started 08/19/2022   Letrozole toxicities: Tolerating letrozole extremely well without any problems or concerns.   Abemaciclib toxicities: Alternating diarrhea with constipation Mild leukopenia: Monitoring closely   Breast cancer surveillance: 1.  Breast exam 12/26/22: Benign 2. mammogram: No role of imaging since she had bilateral mastectomies   Return to clinic every 2 months.    No orders of the defined types were placed in this encounter.  The patient has a good understanding of the overall plan. she agrees with it. she will call with any problems that may develop before the next visit here. Total time spent: 30 mins including face to face time and time spent for planning, charting and co-ordination of care   Harriette Ohara, MD 12/26/22    I Gardiner Coins am acting as a Education administrator for [employer]  I have reviewed the above documentation for accuracy and completeness, and I agree with the above.

## 2022-12-26 NOTE — Assessment & Plan Note (Signed)
04/08/2021:Palpable right breast mass: Mammogram and US showed a 4.5cm right breast mass at the 12-1 o'clock position with associated microcalcifications, multiple additional masses from the 9-11 o'clock position in the right breast, calcifications in the subareolar right breast, 1.4cm, and two enlarged right axillary lymph nodes. 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  Neoadjuvant chemotherapy with dose dense Adriamycin and Cytoxan x4 followed by Taxol weekly x12 completed 09/14/21 2. Bilateral mastectomies with targeted node dissection 10/21/21 Left: Benign Right: 6 cm residual IDC 2/7 LN Positive ER: 80%, PR 1%, Her 2 Neg 3.  Adjuvant radiation 12/01/2021-01/11/2022 4.  Oophorectomy 06/12/2022  ---------------------------------------------------------------------------------------------------------------------- Current treatment: Letrozole started 02/01/2022.  Verzinio started 08/19/2022   Letrozole toxicities: Tolerating letrozole extremely well without any problems or concerns.   Abemaciclib toxicities: Alternating diarrhea with constipation Mild leukopenia: Monitoring closely   Breast cancer surveillance: 1.  Breast exam 12/26/22: Benign 2. mammogram: No role of imaging since she had bilateral mastectomies   Return to clinic every 2 months.

## 2022-12-29 ENCOUNTER — Telehealth: Payer: Self-pay | Admitting: Hematology and Oncology

## 2022-12-29 NOTE — Telephone Encounter (Signed)
Scheduled appointment per 1/23 los. Patient is aware.

## 2023-01-02 ENCOUNTER — Other Ambulatory Visit (HOSPITAL_COMMUNITY): Payer: Self-pay

## 2023-01-02 ENCOUNTER — Telehealth: Payer: Self-pay | Admitting: Pharmacy Technician

## 2023-01-02 NOTE — Telephone Encounter (Signed)
Oral Oncology Patient Advocate Encounter   Was successful in obtaining a copay card for Verzenio.  This copay card will make the patients copay $0.  The billing information is as follows and has been shared with [facility].   RxBin: [id number] PCN: [id number] Member ID: [id number] Group ID: [id number]   [name], CPhT-Adv Oncology Pharmacy Patient [place] Direct Number: [phone]  Fax: [phone]

## 2023-01-02 NOTE — Telephone Encounter (Signed)
Oral Oncology Patient Advocate Encounter  Prior Authorization for Chelsea Wise has been approved.    PA#  [id number] Effective dates: 01/02/23 through 01/02/24  Patients co-pay is $110.   Patient must continue to fill at [place], [place] Patient [place] Direct Number: [phone]  Fax: [phone]

## 2023-01-02 NOTE — Telephone Encounter (Signed)
Oral Oncology Patient Advocate Encounter   Received notification that prior authorization for Verzenio is due for renewal.   PA submitted on 01/02/23 [place] Status is pending     [name], CPhT-Adv Oncology Pharmacy Patient [place] Direct Number: [phone]  Fax: [phone]

## 2023-01-29 ENCOUNTER — Ambulatory Visit: Payer: Federal, State, Local not specified - PPO | Attending: General Surgery

## 2023-01-29 VITALS — Wt 137.4 lb

## 2023-01-29 DIAGNOSIS — Z483 Aftercare following surgery for neoplasm: Secondary | ICD-10-CM | POA: Insufficient documentation

## 2023-01-29 NOTE — Therapy (Signed)
OUTPATIENT PHYSICAL THERAPY SOZO SCREENING NOTE   Patient Name: Chelsea Wise MRN: [id number] DOB:[birth year], 42 y.o., female Today's Date: 01/29/2023  PCP: Sherlyn Hay, DO REFERRING PROVIDER: Sherlyn Hay, *   PT End of Session - 01/29/23 1013     Visit Number 10   # ucnhanged due to screen only   PT Start Time 0943    PT Stop Time 0947    PT Time Calculation (min) 4 min    Activity Tolerance Patient tolerated treatment well             Past Medical History:  Diagnosis Date   Family history of breast cancer    Family history of lung cancer    Family history of pancreatic cancer    Family history of prostate cancer    History of cancer chemotherapy    right breast cancer  05-10-2021  to 09-20-2021   History of external beam radiation therapy    right breast 12-01-2021 to 01-11-2022   Irritation of right eye 06/01/2022   Malignant neoplasm of upper-inner quadrant of right breast in female, estrogen receptor positive ([place]) 04/08/2021   oncologist--- dr Lindi Adie;  Stage IIA,  IDC/  DCIS;  completed chemo 09-20-2021 , completed radiation 01-11-2022   Wears contact lenses    Past Surgical History:  Procedure Laterality Date   LAPAROSCOPIC BILATERAL SALPINGO OOPHERECTOMY Bilateral 06/12/2022   Procedure: LAPAROSCOPIC BILATERAL SALPINGO OOPHORECTOMY;  Surgeon: Sherlyn Hay, DO;  Location: [facility];  Service: Gynecology;  Laterality: Bilateral;   MASTECTOMY W/ SENTINEL NODE BIOPSY Right 10/21/2021   Procedure: RIGHT MASTECTOMY WITH RIGHT AXILLARY SENTINEL LYMPH NODE BIOPSY;  Surgeon: Rolm Bookbinder, MD;  Location: [facility];  Service: General;  Laterality: Right;   PORT-A-CATH REMOVAL Left 10/21/2021   Procedure: REMOVAL PORT-A-CATH;  Surgeon: Rolm Bookbinder, MD;  Location: [facility];  Service: General;  Laterality: Left;   PORTACATH PLACEMENT Left 05/09/2021   Procedure: INSERTION  PORT-A-CATH;  Surgeon: Rolm Bookbinder, MD;  Location: [facility];  Service: General;  Laterality: Left;   RADIOACTIVE SEED GUIDED AXILLARY SENTINEL LYMPH NODE Right 10/21/2021   Procedure: RADIOACTIVE SEED GUIDED RIGHT AXILLARY LYMPH NODE EXCISION;  Surgeon: Rolm Bookbinder, MD;  Location: [facility];  Service: General;  Laterality: Right;   TOTAL MASTECTOMY Left 10/21/2021   Procedure: LEFT TOTAL MASTECTOMY;  Surgeon: Rolm Bookbinder, MD;  Location: [facility];  Service: General;  Laterality: Left;   Gar[place]di EXTRACTION     Patient Active Problem List   Diagnosis Date Noted   S/P bilateral mastectomy 10/21/2021   Genetic testing 04/19/2021   Malignant neoplasm of upper-inner quadrant of right breast in female, estrogen receptor positive ([place]) 04/13/2021   Family history of prostate cancer    Family history of pancreatic cancer    Family history of breast cancer    Family history of lung cancer    Active labor 03/15/2016   Postpartum care following vaginal delivery 03/15/2016   SVD (spontaneous vaginal delivery) 01/18/2014   Labor and delivery, indication for care 01/17/2014   Antepartum bleeding, third trimester 01/01/2014   Placenta previa in second trimester 11/28/2013    REFERRING DIAG: right breast cancer at risk for lymphedema  THERAPY DIAG: Aftercare following surgery for neoplasm  PERTINENT HISTORY: Patient was diagnosed on 04/11/2021 with right grade II invasive ductal carcinoma breast cancer. It measures 4.5 cm and is located in the upper inner quadrant. It is ER positive,  weakly PR positive, and HER2 equivocal. Ki67 is 25%. She has 2 abnormal appearing axillary lymph nodes. One was biopsied and found to be positive.   PRECAUTIONS: right UE Lymphedema risk, None  SUBJECTIVE: Pt returns for her 3 month L-Dex screen.   PAIN:  Are you having pain? No  SOZO SCREENING: Patient was assessed today using the SOZO machine to determine the  lymphedema index score. This was compared to her baseline score. It was determined that she is within the recommended range when compared to her baseline and no further action is needed at this time. She will continue SOZO screenings. These are done every 3 months for 2 years post operatively followed by every 6 months for 2 years, and then annually.   Since pts change from baseline is almost to subclinical lymphedema encouraged her to wear her compression sleeve when exercising and pt agreed.   L-DEX FLOWSHEETS - 01/29/23 1000       L-DEX LYMPHEDEMA SCREENING   Measurement Type Unilateral    L-DEX MEASUREMENT EXTREMITY Upper Extremity    POSITION  Standing    DOMINANT SIDE Right    At Risk Side Right    BASELINE SCORE (UNILATERAL) 1.6    L-DEX SCORE (UNILATERAL) 0    VALUE CHANGE (UNILAT) -1.6              Otelia Limes, PTA 01/29/2023, 10:14 AM

## 2023-02-20 ENCOUNTER — Inpatient Hospital Stay: Payer: Federal, State, Local not specified - PPO | Admitting: Pharmacist

## 2023-02-20 ENCOUNTER — Inpatient Hospital Stay: Payer: Federal, State, Local not specified - PPO | Attending: Adult Health

## 2023-02-20 VITALS — BP 123/55 | HR 74 | Temp 97.7°F | Resp 18 | Ht 63.5 in | Wt 139.4 lb

## 2023-02-20 DIAGNOSIS — C50211 Malignant neoplasm of upper-inner quadrant of right female breast: Secondary | ICD-10-CM | POA: Insufficient documentation

## 2023-02-20 DIAGNOSIS — Z79811 Long term (current) use of aromatase inhibitors: Secondary | ICD-10-CM | POA: Insufficient documentation

## 2023-02-20 DIAGNOSIS — Z170 Estrogen receptor positive status [ER+]: Secondary | ICD-10-CM | POA: Insufficient documentation

## 2023-02-20 LAB — CBC WITH DIFFERENTIAL (CANCER CENTER ONLY)
Abs Immature Granulocytes: 0 10*3/uL (ref 0.00–0.07)
Basophils Absolute: 0 10*3/uL (ref 0.0–0.1)
Basophils Relative: 1 %
Eosinophils Absolute: 0 10*3/uL (ref 0.0–0.5)
Eosinophils Relative: 1 %
HCT: 37.2 % (ref 36.0–46.0)
Hemoglobin: 12.9 g/dL (ref 12.0–15.0)
Immature Granulocytes: 0 %
Lymphocytes Relative: 46 %
Lymphs Abs: 2 10*3/uL (ref 0.7–4.0)
MCH: 33.8 pg (ref 26.0–34.0)
MCHC: 34.7 g/dL (ref 30.0–36.0)
MCV: 97.4 fL (ref 80.0–100.0)
Monocytes Absolute: 0.3 10*3/uL (ref 0.1–1.0)
Monocytes Relative: 6 %
Neutro Abs: 2.1 10*3/uL (ref 1.7–7.7)
Neutrophils Relative %: 46 %
Platelet Count: 226 10*3/uL (ref 150–400)
RBC: 3.82 MIL/uL — ABNORMAL LOW (ref 3.87–5.11)
RDW: 13.9 % (ref 11.5–15.5)
WBC Count: 4.4 10*3/uL (ref 4.0–10.5)
nRBC: 0 % (ref 0.0–0.2)

## 2023-02-20 LAB — CMP (CANCER CENTER ONLY)
ALT: 10 U/L (ref 0–44)
AST: 16 U/L (ref 15–41)
Albumin: 4.7 g/dL (ref 3.5–5.0)
Alkaline Phosphatase: 57 U/L (ref 38–126)
Anion gap: 6 (ref 5–15)
BUN: 12 mg/dL (ref 6–20)
CO2: 30 mmol/L (ref 22–32)
Calcium: 9.9 mg/dL (ref 8.9–10.3)
Chloride: 103 mmol/L (ref 98–111)
Creatinine: 0.94 mg/dL (ref 0.44–1.00)
GFR, Estimated: >60 mL/min (ref >60)
Glucose, Bld: 68 mg/dL — ABNORMAL LOW (ref 70–99)
Potassium: 3.6 mmol/L (ref 3.5–5.1)
Sodium: 139 mmol/L (ref 135–145)
Total Bilirubin: 0.3 mg/dL (ref 0.3–1.2)
Total Protein: 8 g/dL (ref 6.5–8.1)

## 2023-02-20 NOTE — Progress Notes (Signed)
[place]       Telephone: [phone]?Fax: [phone]   Oncology Clinical Pharmacist Practitioner Progress Note  Chelsea Wise was contacted via in-person to discuss her chemotherapy regimen for abemaciclib which they receive under the care of Dr. Nicholas Lose.   Current treatment regimen and start date Abemaciclib (08/19/2022) Letrozole (02/01/2022)   Interval History She continues on abemaciclib 100 mg by mouth every 12 hours on days 1 to 28 of a 28-day cycle. This is being given in combination with letrozole 2.5mg  by mouth daily . Therapy is planned to continue until 2 years in the adjuvant setting per the monarchE trial. She last saw clinical pharmacy on 10/18/22 and last saw Dr. Lindi Adie on 12/26/22. The back pain she was describing at her last visit with Dr. Lindi Adie comes and goes. It is not constant and not getting any worse per her report. Improved from her last visit  Response to Therapy Overall, she is doing well. Her only concern is loose stools that are happening every other day in the morning. She states that she is taking 30 mL (4 mg) of loperamide when this happens which corrects the issue and then it happens again two days later. She said she is taking her abemaciclib one hour later now due to the time change and we discussed going back to 730 am and 730 pm which she will try. If that does not work, she will try loperamide every other day at a 2 mg dose in the morning. We did caution her to monitor for constipation and she verbalized understanding of the plan. We also gave her written information and dietary considerations to manage diarrhea.   Per Dr. Lindi Adie, she has follow up every 2 months now and will see him again in May. We will follow up with her in July. She knows she can contact Dr. Geralyn Flash clinic sooner with any questions or concerns in the interim. Labs, vitals, treatment parameters, and manufacturer guidelines assessing toxicity were reviewed with  Chelsea Wise today. Based on these values, patient is in agreement to continue abemaciclib therapy at this time.  Allergies No Known Allergies  Vitals    02/20/2023   11:01 AM 01/29/2023   10:13 AM 12/26/2022   12:04 PM  Oncology Vitals  Height 161 cm    Weight 63.231 kg 62.313 kg 63.05 kg  Weight (lbs) 139 lbs 6 oz 137 lbs 6 oz 139 lbs  BMI 24.31 kg/m2   24.31 kg/m2 23.95 kg/m2   23.95 kg/m2 24.24 kg/m2   24.24 kg/m2  Temp 97.7 F (36.5 C)  97.2 F (36.2 C)  Pulse Rate 74  61  BP 123/55  135/79  Resp 18  16  SpO2 100 %  100 %  BSA (m2) 1.68 m2   1.68 m2 1.67 m2   1.67 m2 1.68 m2   1.68 m2    Laboratory Data    Latest Ref Rng & Units 02/20/2023   10:31 AM 12/26/2022   11:24 AM 10/18/2022   10:51 AM  CBC EXTENDED  WBC 4.0 - 10.5 K/uL 4.4  4.1  3.8   RBC 3.87 - 5.11 MIL/uL 3.82  3.78  3.93   Hemoglobin 12.0 - 15.0 g/dL 12.9  12.3  12.7   HCT 36.0 - 46.0 % 37.2  35.8  36.6   Platelets 150 - 400 K/uL 226  183  231   NEUT# 1.7 - 7.7 K/uL 2.1  2.2  2.0  Lymph# 0.7 - 4.0 K/uL 2.0  1.7  1.5        Latest Ref Rng & Units 02/20/2023   10:31 AM 12/26/2022   11:24 AM 10/18/2022   10:51 AM  CMP  Glucose 70 - 99 mg/dL 68  78  86   BUN 6 - 20 mg/dL 12  13  12    Creatinine 0.44 - 1.00 mg/dL 0.94  0.98  0.98   Sodium 135 - 145 mmol/L 139  137  137   Potassium 3.5 - 5.1 mmol/L 3.6  3.7  3.9   Chloride 98 - 111 mmol/L 103  102  102   CO2 22 - 32 mmol/L 30  30  30    Calcium 8.9 - 10.3 mg/dL 9.9  9.7  10.2   Total Protein 6.5 - 8.1 g/dL 8.0  7.2  8.1   Total Bilirubin 0.3 - 1.2 mg/dL 0.3  0.4  0.3   Alkaline Phos 38 - 126 U/L 57  60  54   AST 15 - 41 U/L 16  17  18    ALT 0 - 44 U/L 10  10  13      No results found for: "MG" No results found for: "CA2729"   Adverse Effects Assessment Loose stools: as above, will go back to 730 am/pm dosing. May consider loperamide 2 mg liquid every other day  Adherence Assessment Chelsea Wise reports missing 0 doses over the  past 8 weeks.   Reason for missed dose: n/a Patient was re-educated on importance of adherence.   Access Assessment Chelsea Wise is currently receiving her abemaciclib through [facility] concerns:  none  Medication Reconciliation The patient's medication list was reviewed today with the patient? Yes New medications or herbal supplements have recently been started? No  Any medications have been discontinued? No  The medication list was updated and reconciled based on the patient's most recent medication list in the electronic medical record (EMR) including herbal products and OTC medications.   Medications Current Outpatient Medications  Medication Sig Dispense Refill   acetaminophen (TYLENOL) 500 MG tablet Take 500-1,000 mg by mouth every 6 (six) hours as needed for moderate pain.     Cholecalciferol (VITAMIN D3) 25 MCG (1000 UT) CAPS Take 1,000 Int'l Units by mouth daily.     letrozole (FEMARA) 2.5 MG tablet Take 1 tablet (2.5 mg total) by mouth daily. 90 tablet 3   loperamide (IMODIUM) 1 MG/5ML solution Take 2 mg by mouth as needed for diarrhea or loose stools.     loteprednol (LOTEMAX) 0.5 % ophthalmic suspension 3 drops 3 (three) times daily. (Patient not taking: Reported on 09/05/2022)     Multiple Vitamin (MULTIVITAMIN WITH MINERALS) TABS tablet Take 2 tablets by mouth daily.     Noni, Morinda citrifolia, (NONI JUICE) 3000 MG/30ML LIQD Take 60 mLs by mouth daily.     ondansetron (ZOFRAN) 8 MG tablet Take 1 tablet (8 mg total) by mouth every 8 (eight) hours as needed for nausea or vomiting. 30 tablet 2   prochlorperazine (COMPAZINE) 10 MG tablet Take 1 tablet (10 mg total) by mouth every 6 (six) hours as needed for nausea or vomiting. 30 tablet 2   VERZENIO 100 MG tablet TAKE 1 TABLET BY MOUTH 2 TIMES A DAY 56 tablet 2   No current facility-administered medications for this visit.    Drug-Drug Interactions (DDIs) DDIs were evaluated?  Yes Significant DDIs? No  The patient was instructed to  speak with their health care provider and/or the oral chemotherapy pharmacist before starting any new drug, including prescription or over the counter, natural / herbal products, or vitamins.  Supportive Care Diarrhea: we reviewed that diarrhea is common with abemaciclib and confirmed that she does have loperamide (Imodium) at home.  We reviewed how to take this medication PRN. Neutropenia: we discussed the importance of having a thermometer and what the Centers for Disease Control and Prevention (CDC) considers a fever which is 100.6F (38C) or higher.  Gave patient 24/7 triage line to call if any fevers or symptoms. ILD/Pneumonitis: we reviewed potential symptoms including cough, shortness, and fatigue.  VTE: reviewed signs of DVT such as leg swelling, redness, pain, or tenderness and signs of PE such as shortness of breath, rapid or irregular heartbeat, cough, chest pain, or lightheadedness. Reviewed to take the medication every 12 hours (with food sometimes can be easier on the stomach) and to take it at the same time every day. Hepatotoxicity: WNL  Dosing Assessment Hepatic adjustments needed? No  Renal adjustments needed? No  Toxicity adjustments needed? No  The current dosing regimen is appropriate to continue at this time.  Follow-Up Plan Continue abemaciclib 100 mg by mouth every 12 hours.  No increase in dose at this time due to continued loose stool. Continue letrozole 2.5 mg by mouth daily Continue loperamide as needed for loose stool.  She will try adjusting her administration times for abemaciclib back to 730 am/pm. May consider every other day loperamide at 2 mg (15 mL) dose. Will add labs, pharmacy clinic visit, in 4 months Ms.  Wise has a visit scheduled with Dr. Lindi Adie 04/17/23.   Chelsea Wise participated in the discussion, expressed understanding, and voiced agreement with the above plan. All questions were  answered to her satisfaction. The patient was advised to contact the clinic at [phone] with any questions or concerns prior to her return visit.   I spent 30 minutes assessing and educating the patient.  Raina Mina, RPH-CPP, 02/20/2023  11:42 AM   **Disclaimer: This note was dictated with voice recognition software. Similar sounding words can inadvertently be transcribed and this note may contain transcription errors which may not have been corrected upon publication of note.**

## 2023-02-21 ENCOUNTER — Telehealth: Payer: Self-pay | Admitting: Pharmacist

## 2023-02-21 NOTE — Telephone Encounter (Signed)
Scheduled appointments per 3/29 los. Patient is aware of the made appointments.

## 2023-03-02 ENCOUNTER — Other Ambulatory Visit: Payer: Self-pay | Admitting: Hematology and Oncology

## 2023-03-13 ENCOUNTER — Telehealth: Payer: Self-pay

## 2023-03-13 NOTE — Telephone Encounter (Signed)
Notified Patient of completion of Reasonable Accommodation Request. Copy of form emailed to Patient ([email or web address]) as requested. Patient will give form to Employer. No other needs or concerns voiced at this time.

## 2023-04-16 NOTE — Progress Notes (Signed)
Patient Care Team: Edwinna Areola, DO as PCP - General (Obstetrics and Gynecology) Lonie Peak, MD as Consulting Physician (Radiation Oncology) Emelia Loron, MD as Consulting Physician (General Surgery) Serena Croissant, MD as Consulting Physician (Hematology and Oncology) Anselm Lis, RPH-CPP as Pharmacist (Hematology and Oncology)  DIAGNOSIS: No diagnosis found.  SUMMARY OF ONCOLOGIC HISTORY: Oncology History  Malignant neoplasm of upper-inner quadrant of right breast in female, estrogen receptor positive (HCC)  04/08/2021 Initial Diagnosis   Palpable right breast mass: Mammogram and US showed a 4.5cm right breast mass at the 12-1 o'clock position with associated microcalcifications, multiple additional masses from the 9-11 o'clock position in the right breast, calcifications in the subareolar right breast, 1.4cm, and two enlarged right axillary lymph nodes. Biopsy showed invasive and in situ ductal carcinoma in the breast and axilla, grade 2 ER 80%, PR 1%, Ki-67 25%, HER2 equivalent by [facility]   04/08/2021 Cancer Staging   Staging form: Breast, AJCC 8th Edition - Clinical stage from 04/08/2021: Stage IIA (cT2, cN1, cM0, G2, ER+, PR+, HER2-) - Signed by Serena Croissant, MD on 04/13/2021 Stage prefix: Initial diagnosis Histologic grading system: 3 grade system   04/19/2021 Genetic Testing   Negative genetic testing:  No pathogenic variants detected on the [employer] BRCAplus or CancerNext-Expanded + RNAinsight panels. Two variants of uncertain significance (VUS) were detected - one in the APC gene called p.T2422I (c.7265C>T) and a second in the BAP1 gene called p.P302L (c.905C>T). The report dates are 04/19/2021 and 04/25/2021, respectively.  The BRCAplus panel offered by [employer] and includes sequencing and deletion/duplication analysis for the following 8 genes: ATM, BRCA1, BRCA2, CDH1, CHEK2, PALB2, PTEN, and TP53. The CancerNext-Expanded + RNAinsight gene panel offered by [employer] and includes sequencing and rearrangement analysis for the following 77 genes: AIP, ALK, APC, ATM, AXIN2, BAP1, BARD1, BLM, BMPR1A, BRCA1, BRCA2, BRIP1, CDC73, CDH1, CDK4, CDKN1B, CDKN2A, CHEK2, CTNNA1, DICER1, FANCC, FH, FLCN, GALNT12, KIF1B, LZTR1, MAX, MEN1, MET, MLH1, MSH2, MSH3, MSH6, MUTYH, NBN, NF1, NF2, NTHL1, PALB2, PHOX2B, PMS2, POT1, PRKAR1A, PTCH1, PTEN, RAD51C, RAD51D, RB1, RECQL, RET, SDHA, SDHAF2, SDHB, SDHC, SDHD, SMAD4, SMARCA4, SMARCB1, SMARCE1, STK11, SUFU, TMEM127, TP53, TSC1, TSC2, VHL and XRCC2 (sequencing and deletion/duplication); EGFR, EGLN1, HOXB13, KIT, MITF, PDGFRA, POLD1 and POLE (sequencing only); EPCAM and GREM1 (deletion/duplication only). RNA data is routinely analyzed for use in variant interpretation for all genes.    05/10/2021 - 09/20/2021 Chemotherapy   Patient is on Treatment Plan : BREAST ADJUVANT DOSE DENSE AC q14d / PACLitaxel q7d     10/21/2021 Surgery   Bilateral mastectomies: Left breast benign.  Right breast showed 6 cm of invasive ductal carcinoma, grade 2, margins negative, 2 out of 7 lymph nodes were positive for macrometastases.  ER positive, PR negative, HER2 negative.   10/21/2021 Cancer Staging   Staging form: Breast, AJCC 8th Edition - Pathologic stage from 10/21/2021: No Stage Recommended (ypT3, pN1a, cM0, G2, ER+, PR-, HER2-) - Signed by Loa Socks, NP on 04/03/2022 Stage prefix: Post-therapy Histologic grading system: 3 grade system   11/30/2021 - 01/11/2022 Radiation Therapy   Site Technique Total Dose (Gy) Dose per Fx (Gy) Completed Fx Beam Energies  Chest Wall, Right: CW_R_IMN 3D 50/50 2 25/25 6X, 10X  Chest Wall, Right: CW_R_PAB_SCV 3D 50/50 2 25/25 6X, 10X  Chest Wall, Right: CW_R_Bst Electron 10/10 2 5/5 6E       CHIEF COMPLIANT:  Follow-up on Letrozole and Verzenio       INTERVAL HISTORY: Harle Battiest  Chelsea Wise is a 42 y.o with the above mentioned right breast mass currently on letrozole and Verzenio. She presents  to the clinic today for a follow-up.    ALLERGIES:  has No Known Allergies.  MEDICATIONS:  Current Outpatient Medications  Medication Sig Dispense Refill   abemaciclib (VERZENIO) 100 MG tablet TAKE 1 TABLET BY MOUTH 2 TIMES A DAY 56 tablet 5   acetaminophen (TYLENOL) 500 MG tablet Take 500-1,000 mg by mouth every 6 (six) hours as needed for moderate pain.     Cholecalciferol (VITAMIN D3) 25 MCG (1000 UT) CAPS Take 1,000 Int'l Units by mouth daily.     letrozole (FEMARA) 2.5 MG tablet Take 1 tablet (2.5 mg total) by mouth daily. 90 tablet 3   loperamide (IMODIUM) 1 MG/5ML solution Take 2 mg by mouth as needed for diarrhea or loose stools.     loteprednol (LOTEMAX) 0.5 % ophthalmic suspension 3 drops 3 (three) times daily. (Patient not taking: Reported on 09/05/2022)     Multiple Vitamin (MULTIVITAMIN WITH MINERALS) TABS tablet Take 2 tablets by mouth daily.     Noni, Morinda citrifolia, (NONI JUICE) 3000 MG/30ML LIQD Take 60 mLs by mouth daily.     ondansetron (ZOFRAN) 8 MG tablet Take 1 tablet (8 mg total) by mouth every 8 (eight) hours as needed for nausea or vomiting. 30 tablet 2   prochlorperazine (COMPAZINE) 10 MG tablet Take 1 tablet (10 mg total) by mouth every 6 (six) hours as needed for nausea or vomiting. 30 tablet 2   No current facility-administered medications for this visit.    PHYSICAL EXAMINATION: ECOG PERFORMANCE STATUS: {CHL ONC ECOG [phone]}  There were no vitals filed for this visit. There were no vitals filed for this visit.  BREAST:*** No palpable masses or nodules in either right or left breasts. No palpable axillary supraclavicular or infraclavicular adenopathy no breast tenderness or nipple discharge. (exam performed in the presence of a chaperone)  LABORATORY DATA:  I have reviewed the data as listed    Latest Ref Rng & Units 02/20/2023   10:31 AM 12/26/2022   11:24 AM 10/18/2022   10:51 AM  CMP  Glucose 70 - 99 mg/dL 68  78  86   BUN 6 - 20 mg/dL 12   13  12    Creatinine 0.44 - 1.00 mg/dL 1.61  0.96  0.45   Sodium 135 - 145 mmol/L 139  137  137   Potassium 3.5 - 5.1 mmol/L 3.6  3.7  3.9   Chloride 98 - 111 mmol/L 103  102  102   CO2 22 - 32 mmol/L 30  30  30    Calcium 8.9 - 10.3 mg/dL 9.9  9.7  40.9   Total Protein 6.5 - 8.1 g/dL 8.0  7.2  8.1   Total Bilirubin 0.3 - 1.2 mg/dL 0.3  0.4  0.3   Alkaline Phos 38 - 126 U/L 57  60  54   AST 15 - 41 U/L 16  17  18    ALT 0 - 44 U/L 10  10  13      Lab Results  Component Value Date   WBC 4.4 02/20/2023   HGB 12.9 02/20/2023   HCT 37.2 02/20/2023   MCV 97.4 02/20/2023   PLT 226 02/20/2023   NEUTROABS 2.1 02/20/2023    ASSESSMENT & PLAN:  No problem-specific Assessment & Plan notes found for this encounter.    No orders of the defined types were placed in this encounter.  The patient has a good understanding of the overall plan. she agrees with it. she will call with any problems that may develop before the next visit here. Total time spent: 30 mins including face to face time and time spent for planning, charting and co-ordination of care   Sherlyn Lick, CMA 04/16/23    I Janan Ridge am acting as a Neurosurgeon for [employer]  ***

## 2023-04-17 ENCOUNTER — Inpatient Hospital Stay: Payer: Federal, State, Local not specified - PPO | Attending: Adult Health | Admitting: Hematology and Oncology

## 2023-04-17 ENCOUNTER — Inpatient Hospital Stay: Payer: Federal, State, Local not specified - PPO

## 2023-04-17 VITALS — BP 120/59 | HR 63 | Temp 97.8°F | Resp 18 | Ht 63.5 in | Wt 141.0 lb

## 2023-04-17 DIAGNOSIS — Z79811 Long term (current) use of aromatase inhibitors: Secondary | ICD-10-CM | POA: Diagnosis not present

## 2023-04-17 DIAGNOSIS — Z9013 Acquired absence of bilateral breasts and nipples: Secondary | ICD-10-CM | POA: Insufficient documentation

## 2023-04-17 DIAGNOSIS — Z170 Estrogen receptor positive status [ER+]: Secondary | ICD-10-CM

## 2023-04-17 DIAGNOSIS — D72819 Decreased white blood cell count, unspecified: Secondary | ICD-10-CM | POA: Diagnosis not present

## 2023-04-17 DIAGNOSIS — D649 Anemia, unspecified: Secondary | ICD-10-CM | POA: Diagnosis not present

## 2023-04-17 DIAGNOSIS — C50211 Malignant neoplasm of upper-inner quadrant of right female breast: Secondary | ICD-10-CM

## 2023-04-17 LAB — CMP (CANCER CENTER ONLY)
ALT: 10 U/L (ref 0–44)
AST: 18 U/L (ref 15–41)
Albumin: 4.5 g/dL (ref 3.5–5.0)
Alkaline Phosphatase: 46 U/L (ref 38–126)
Anion gap: 6 (ref 5–15)
BUN: 12 mg/dL (ref 6–20)
CO2: 31 mmol/L (ref 22–32)
Calcium: 9.4 mg/dL (ref 8.9–10.3)
Chloride: 104 mmol/L (ref 98–111)
Creatinine: 0.92 mg/dL (ref 0.44–1.00)
GFR, Estimated: >60 mL/min (ref >60)
Glucose, Bld: 74 mg/dL (ref 70–99)
Potassium: 3.8 mmol/L (ref 3.5–5.1)
Sodium: 141 mmol/L (ref 135–145)
Total Bilirubin: 0.3 mg/dL (ref 0.3–1.2)
Total Protein: 7.5 g/dL (ref 6.5–8.1)

## 2023-04-17 LAB — CBC WITH DIFFERENTIAL (CANCER CENTER ONLY)
Abs Immature Granulocytes: 0 10*3/uL (ref 0.00–0.07)
Basophils Absolute: 0 10*3/uL (ref 0.0–0.1)
Basophils Relative: 1 %
Eosinophils Absolute: 0 10*3/uL (ref 0.0–0.5)
Eosinophils Relative: 1 %
HCT: 34.1 % — ABNORMAL LOW (ref 36.0–46.0)
Hemoglobin: 11.6 g/dL — ABNORMAL LOW (ref 12.0–15.0)
Immature Granulocytes: 0 %
Lymphocytes Relative: 42 %
Lymphs Abs: 1.4 10*3/uL (ref 0.7–4.0)
MCH: 33.9 pg (ref 26.0–34.0)
MCHC: 34 g/dL (ref 30.0–36.0)
MCV: 99.7 fL (ref 80.0–100.0)
Monocytes Absolute: 0.2 10*3/uL (ref 0.1–1.0)
Monocytes Relative: 6 %
Neutro Abs: 1.6 10*3/uL — ABNORMAL LOW (ref 1.7–7.7)
Neutrophils Relative %: 50 %
Platelet Count: 194 10*3/uL (ref 150–400)
RBC: 3.42 MIL/uL — ABNORMAL LOW (ref 3.87–5.11)
RDW: 13.3 % (ref 11.5–15.5)
WBC Count: 3.2 10*3/uL — ABNORMAL LOW (ref 4.0–10.5)
nRBC: 0 % (ref 0.0–0.2)

## 2023-04-17 NOTE — Assessment & Plan Note (Signed)
04/08/2021:Palpable right breast mass: Mammogram and US showed a 4.5cm right breast mass at the 12-1 o'clock position with associated microcalcifications, multiple additional masses from the 9-11 o'clock position in the right breast, calcifications in the subareolar right breast, 1.4cm, and two enlarged right axillary lymph nodes. 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  Neoadjuvant chemotherapy with dose dense Adriamycin and Cytoxan x4 followed by Taxol weekly x12 completed 09/14/21 2. Bilateral mastectomies with targeted node dissection 10/21/21 Left: Benign Right: 6 cm residual IDC 2/7 LN Positive ER: 80%, PR 1%, Her 2 Neg 3.  Adjuvant radiation 12/01/2021-01/11/2022 4.  Oophorectomy 06/12/2022  ---------------------------------------------------------------------------------------------------------------------- Current treatment: Letrozole started 02/01/2022.  Verzinio started 08/19/2022   Letrozole toxicities: Tolerating letrozole extremely well without any problems or concerns.   Abemaciclib toxicities: Alternating diarrhea with constipation Mild leukopenia: Monitoring closely   Breast cancer surveillance: 1.  Breast exam 12/26/22: Benign 2. mammogram: No role of imaging since she had bilateral mastectomies   Return to clinic every 2 months. 

## 2023-04-23 ENCOUNTER — Ambulatory Visit: Payer: Federal, State, Local not specified - PPO | Attending: General Surgery

## 2023-04-23 VITALS — Wt 136.2 lb

## 2023-04-23 DIAGNOSIS — Z483 Aftercare following surgery for neoplasm: Secondary | ICD-10-CM

## 2023-04-23 NOTE — Therapy (Signed)
OUTPATIENT PHYSICAL THERAPY SOZO SCREENING NOTE   Patient Name: Chelsea Wise MRN: [id number] DOB:1981-01-09, 42 y.o., female Today's Date: 04/23/2023  PCP: Edwinna Areola, DO REFERRING PROVIDER: Emelia Loron, MD   PT End of Session - 04/23/23 1646     Visit Number 10   # unchanged due to screen only   PT Start Time 1645    PT Stop Time 1649    PT Time Calculation (min) 4 min    Activity Tolerance Patient tolerated treatment well    Behavior During Therapy [facility] for tasks assessed/performed             Past Medical History:  Diagnosis Date   Family history of breast cancer    Family history of lung cancer    Family history of pancreatic cancer    Family history of prostate cancer    History of cancer chemotherapy    right breast cancer  05-10-2021  to 09-20-2021   History of external beam radiation therapy    right breast 12-01-2021 to 01-11-2022   Irritation of right eye 06/01/2022   Malignant neoplasm of upper-inner quadrant of right breast in female, 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   oncologist--- dr Pamelia Hoit;  Stage IIA,  IDC/  DCIS;  completed chemo 09-20-2021 , completed radiation 01-11-2022   Wears contact lenses    Past Surgical History:  Procedure Laterality Date   LAPAROSCOPIC BILATERAL SALPINGO OOPHERECTOMY Bilateral 06/12/2022   Procedure: LAPAROSCOPIC BILATERAL SALPINGO OOPHORECTOMY;  Surgeon: Edwinna Areola, DO;  Location: [facility];  Service: Gynecology;  Laterality: Bilateral;   MASTECTOMY W/ SENTINEL NODE BIOPSY Right 10/21/2021   Procedure: RIGHT MASTECTOMY WITH RIGHT AXILLARY SENTINEL LYMPH NODE BIOPSY;  Surgeon: Emelia Loron, MD;  Location: [facility];  Service: General;  Laterality: Right;   PORT-A-CATH REMOVAL Left 10/21/2021   Procedure: REMOVAL PORT-A-CATH;  Surgeon: Emelia Loron, MD;  Location: [facility];  Service: General;  Laterality: Left;    PORTACATH PLACEMENT Left 05/09/2021   Procedure: INSERTION PORT-A-CATH;  Surgeon: Emelia Loron, MD;  Location: [facility];  Service: General;  Laterality: Left;   RADIOACTIVE SEED GUIDED AXILLARY SENTINEL LYMPH NODE Right 10/21/2021   Procedure: RADIOACTIVE SEED GUIDED RIGHT AXILLARY LYMPH NODE EXCISION;  Surgeon: Emelia Loron, MD;  Location: [facility];  Service: General;  Laterality: Right;   TOTAL MASTECTOMY Left 10/21/2021   Procedure: LEFT TOTAL MASTECTOMY;  Surgeon: Emelia Loron, MD;  Location:  SURGERY CENTER;  Service: General;  Laterality: Left;   WISDOM TOOTH EXTRACTION     Patient Active Problem List   Diagnosis Date Noted   S/P bilateral mastectomy 10/21/2021   Genetic testing 04/19/2021   Malignant neoplasm of upper-inner quadrant of right breast in female, estrogen receptor positive (HCC) 04/13/2021   Family history of prostate cancer    Family history of pancreatic cancer    Family history of breast cancer    Family history of lung cancer    Active labor 03/15/2016   Postpartum care following vaginal delivery 03/15/2016   SVD (spontaneous vaginal delivery) 01/18/2014   Labor and delivery, indication for care 01/17/2014   Antepartum bleeding, third trimester 01/01/2014   Placenta previa in second trimester 11/28/2013    REFERRING DIAG: right breast cancer at risk for lymphedema  THERAPY DIAG: Aftercare following surgery for neoplasm  PERTINENT HISTORY: Patient was diagnosed on 04/11/2021 with right grade II invasive ductal carcinoma breast cancer. It measures 4.5 cm and is located  in the upper inner quadrant. It is ER positive, weakly PR positive, and HER2 equivocal. Ki67 is 25%. She has 2 abnormal appearing axillary lymph nodes. One was biopsied and found to be positive.   PRECAUTIONS: right UE Lymphedema risk, None  SUBJECTIVE: Pt returns for her 3 month L-Dex screen.   PAIN:  Are you having pain? No  SOZO SCREENING: Patient  was assessed today using the SOZO machine to determine the lymphedema index score. This was compared to her baseline score. It was determined that she is within the recommended range when compared to her baseline and no further action is needed at this time. She will continue SOZO screenings. These are done every 3 months for 2 years post operatively followed by every 6 months for 2 years, and then annually.   Since pts change from baseline is almost to subclinical lymphedema encouraged her to wear her compression sleeve when exercising and pt agreed.   L-DEX FLOWSHEETS - 04/23/23 1600       L-DEX LYMPHEDEMA SCREENING   Measurement Type Unilateral    L-DEX MEASUREMENT EXTREMITY Upper Extremity    POSITION  Standing    DOMINANT SIDE Right    At Risk Side Right    BASELINE SCORE (UNILATERAL) 1.6    L-DEX SCORE (UNILATERAL) 3.7    VALUE CHANGE (UNILAT) 2.1              Hermenia Bers, PTA 04/23/2023, 4:47 PM

## 2023-06-09 ENCOUNTER — Other Ambulatory Visit: Payer: Self-pay | Admitting: Hematology and Oncology

## 2023-06-19 ENCOUNTER — Inpatient Hospital Stay: Payer: Federal, State, Local not specified - PPO | Attending: Adult Health

## 2023-06-19 ENCOUNTER — Telehealth: Payer: Self-pay | Admitting: Pharmacist

## 2023-06-19 ENCOUNTER — Inpatient Hospital Stay: Payer: Federal, State, Local not specified - PPO | Admitting: Pharmacist

## 2023-06-19 ENCOUNTER — Other Ambulatory Visit: Payer: Self-pay

## 2023-06-19 VITALS — BP 125/76 | HR 73 | Temp 97.5°F | Resp 16 | Wt 140.1 lb

## 2023-06-19 DIAGNOSIS — C50211 Malignant neoplasm of upper-inner quadrant of right female breast: Secondary | ICD-10-CM | POA: Diagnosis not present

## 2023-06-19 DIAGNOSIS — Z170 Estrogen receptor positive status [ER+]: Secondary | ICD-10-CM

## 2023-06-19 DIAGNOSIS — Z79811 Long term (current) use of aromatase inhibitors: Secondary | ICD-10-CM | POA: Diagnosis not present

## 2023-06-19 LAB — CMP (CANCER CENTER ONLY)
ALT: 10 U/L (ref 0–44)
AST: 17 U/L (ref 15–41)
Albumin: 4.5 g/dL (ref 3.5–5.0)
Alkaline Phosphatase: 53 U/L (ref 38–126)
Anion gap: 5 (ref 5–15)
BUN: 14 mg/dL (ref 6–20)
CO2: 31 mmol/L (ref 22–32)
Calcium: 9.8 mg/dL (ref 8.9–10.3)
Chloride: 105 mmol/L (ref 98–111)
Creatinine: 0.95 mg/dL (ref 0.44–1.00)
GFR, Estimated: >60 mL/min (ref >60)
Glucose, Bld: 74 mg/dL (ref 70–99)
Potassium: 3.6 mmol/L (ref 3.5–5.1)
Sodium: 141 mmol/L (ref 135–145)
Total Bilirubin: 0.3 mg/dL (ref 0.3–1.2)
Total Protein: 7.7 g/dL (ref 6.5–8.1)

## 2023-06-19 LAB — CBC WITH DIFFERENTIAL (CANCER CENTER ONLY)
Abs Immature Granulocytes: 0 10*3/uL (ref 0.00–0.07)
Basophils Absolute: 0 10*3/uL (ref 0.0–0.1)
Basophils Relative: 1 %
Eosinophils Absolute: 0 10*3/uL (ref 0.0–0.5)
Eosinophils Relative: 0 %
HCT: 37.1 % (ref 36.0–46.0)
Hemoglobin: 12.6 g/dL (ref 12.0–15.0)
Immature Granulocytes: 0 %
Lymphocytes Relative: 35 %
Lymphs Abs: 1.3 10*3/uL (ref 0.7–4.0)
MCH: 34.3 pg — ABNORMAL HIGH (ref 26.0–34.0)
MCHC: 34 g/dL (ref 30.0–36.0)
MCV: 101.1 fL — ABNORMAL HIGH (ref 80.0–100.0)
Monocytes Absolute: 0.2 10*3/uL (ref 0.1–1.0)
Monocytes Relative: 6 %
Neutro Abs: 2.2 10*3/uL (ref 1.7–7.7)
Neutrophils Relative %: 58 %
Platelet Count: 203 10*3/uL (ref 150–400)
RBC: 3.67 MIL/uL — ABNORMAL LOW (ref 3.87–5.11)
RDW: 12.1 % (ref 11.5–15.5)
WBC Count: 3.8 10*3/uL — ABNORMAL LOW (ref 4.0–10.5)
nRBC: 0 % (ref 0.0–0.2)

## 2023-06-19 NOTE — Progress Notes (Signed)
[facility]       Telephone: [phone]?Fax: [phone]   Oncology Clinical Pharmacist Practitioner Progress Note  Chelsea Wise was contacted via in-person to discuss her chemotherapy regimen for abemaciclib which they receive under the care of Dr. Serena Croissant.   Current treatment regimen and start date Abemaciclib (08/19/2022) Letrozole (02/01/2022)   Interval History She continues on abemaciclib 100 mg by mouth every 12 hours on days 1 to 28 of a 28-day cycle. This is being given in combination with letrozole 2.5 mg by mouth daily. Therapy is planned to continue until 2 years in the adjuvant setting per the monarchE trial. She last saw clinical pharmacy on 02/20/23 and last saw Dr. Pamelia Hoit on 04/17/23.   Response to Therapy Ms. Schinke is doing very well. Her ANC was slightly low at her last visit with Dr. Pamelia Hoit but today is WNL. Her loose stools are also controlled using loperamide and watching what she eats. She is not experiencing any other side effects at this time. We discussed moving her lab and visits out to every 3 months and she would prefer this. She will see Dr. Pamelia Hoit with labs in 3 months and clinical pharmacy in 6 months. She knows she can contact the clinic sooner should she have questions or concerns in the interim. No changes in her medication list and she is doing well from an adherence perspective. Labs, vitals, treatment parameters, and manufacturer guidelines assessing toxicity were reviewed with Harle Battiest Iglesias today. Based on these values, patient is in agreement to continue abemaciclib therapy at this time.  Allergies No Known Allergies  Vitals    06/19/2023    1:01 PM 04/23/2023    4:46 PM 04/17/2023   10:54 AM  Oncology Vitals  Height   161 cm  Weight 63.549 kg 61.803 kg 63.957 kg  Weight (lbs) 140 lbs 2 oz 136 lbs 4 oz 141 lbs  BMI 24.43 kg/m2 23.76 kg/m2 24.59 kg/m2  Temp 97.5 F (36.4 C)  97.8 F (36.6 C)  Pulse Rate 73  63   BP 125/76  120/59  Resp 16  18  SpO2 100 %  100 %  BSA (m2) 1.69 m2 1.66 m2 1.69 m2    Laboratory Data    Latest Ref Rng & Units 06/19/2023   12:40 PM 04/17/2023   10:29 AM 02/20/2023   10:31 AM  CBC EXTENDED  WBC 4.0 - 10.5 K/uL 3.8  3.2  4.4   RBC 3.87 - 5.11 MIL/uL 3.67  3.42  3.82   Hemoglobin 12.0 - 15.0 g/dL 47.4  25.9  56.3   HCT 36.0 - 46.0 % 37.1  34.1  37.2   Platelets 150 - 400 K/uL 203  194  226   NEUT# 1.7 - 7.7 K/uL 2.2  1.6  2.1   Lymph# 0.7 - 4.0 K/uL 1.3  1.4  2.0        Latest Ref Rng & Units 06/19/2023   12:40 PM 04/17/2023   10:29 AM 02/20/2023   10:31 AM  CMP  Glucose 70 - 99 mg/dL 74  74  68   BUN 6 - 20 mg/dL 14  12  12    Creatinine 0.44 - 1.00 mg/dL 8.75  6.43  3.29   Sodium 135 - 145 mmol/L 141  141  139   Potassium 3.5 - 5.1 mmol/L 3.6  3.8  3.6   Chloride 98 - 111 mmol/L 105  104  103   CO2  22 - 32 mmol/L 31  31  30    Calcium 8.9 - 10.3 mg/dL 9.8  9.4  9.9   Total Protein 6.5 - 8.1 g/dL 7.7  7.5  8.0   Total Bilirubin 0.3 - 1.2 mg/dL 0.3  0.3  0.3   Alkaline Phos 38 - 126 U/L 53  46  57   AST 15 - 41 U/L 17  18  16    ALT 0 - 44 U/L 10  10  10      Adverse Effects Assessment Loose stools: managed with PRN loperamide and diet  Adherence Assessment Chenay Caldwell Amborn reports missing 0 doses over the past 8 weeks.   Reason for missed dose: N/A Patient was re-educated on importance of adherence.   Access Assessment Kalinda Romaniello is currently receiving her abemaciclib through [facility] concerns:  none  Medication Reconciliation The patient's medication list was reviewed today with the patient? Yes New medications or herbal supplements have recently been started? No  Any medications have been discontinued? No  The medication list was updated and reconciled based on the patient's most recent medication list in the electronic medical record (EMR) including herbal products and OTC medications.    Medications Current Outpatient Medications  Medication Sig Dispense Refill   abemaciclib (VERZENIO) 100 MG tablet TAKE 1 TABLET BY MOUTH 2 TIMES A DAY 56 tablet 5   acetaminophen (TYLENOL) 500 MG tablet Take 500-1,000 mg by mouth every 6 (six) hours as needed for moderate pain.     Cholecalciferol (VITAMIN D3) 25 MCG (1000 UT) CAPS Take 1,000 Int'l Units by mouth daily.     letrozole (FEMARA) 2.5 MG tablet TAKE 1 TABLET BY MOUTH EVERY DAY 90 tablet 3   loperamide (IMODIUM) 1 MG/5ML solution Take 2 mg by mouth as needed for diarrhea or loose stools.     loteprednol (LOTEMAX) 0.5 % ophthalmic suspension 3 drops 3 (three) times daily.     Multiple Vitamin (MULTIVITAMIN WITH MINERALS) TABS tablet Take 2 tablets by mouth daily.     Noni, Morinda citrifolia, (NONI JUICE) 3000 MG/30ML LIQD Take 60 mLs by mouth daily.     ondansetron (ZOFRAN) 8 MG tablet Take 1 tablet (8 mg total) by mouth every 8 (eight) hours as needed for nausea or vomiting. 30 tablet 2   prochlorperazine (COMPAZINE) 10 MG tablet Take 1 tablet (10 mg total) by mouth every 6 (six) hours as needed for nausea or vomiting. 30 tablet 2   No current facility-administered medications for this visit.    Drug-Drug Interactions (DDIs) DDIs were evaluated? Yes Significant DDIs? No  The patient was instructed to speak with their health care provider and/or the oral chemotherapy pharmacist before starting any new drug, including prescription or over the counter, natural / herbal products, or vitamins.  Supportive Care Diarrhea: we reviewed that diarrhea is common with abemaciclib and confirmed that she does have loperamide (Imodium) at home.  We reviewed how to take this medication PRN. Neutropenia: we discussed the importance of having a thermometer and what the Centers for Disease Control and Prevention (CDC) considers a fever which is 100.65F (38C) or higher.  Gave patient 24/7 triage line to call if any fevers or  symptoms. ILD/Pneumonitis: we reviewed potential symptoms including cough, shortness, and fatigue.  VTE: reviewed signs of DVT such as leg swelling, redness, pain, or tenderness and signs of PE such as shortness of breath, rapid or irregular heartbeat, cough, chest pain, or lightheadedness. Reviewed to  take the medication every 12 hours (with food sometimes can be easier on the stomach) and to take it at the same time every day. Hepatotoxicity:WNL Drug interactions with grapefruit products  Dosing Assessment Hepatic adjustments needed? No  Renal adjustments needed? No  Toxicity adjustments needed? No  The current dosing regimen is appropriate to continue at this time.  Follow-Up Plan Continue abemaciclib 100 mg by mouth every 12 hours. Continue letrozole 2.5 mg by mouth daily Continue loperamide as needed for loose stool.   Will add labs, Dr. Pamelia Hoit visit in 3 months Will add labs, pharmacy clinic visit, in 6 months  Joyel Alonia Dibuono participated in the discussion, expressed understanding, and voiced agreement with the above plan. All questions were answered to her satisfaction. The patient was advised to contact the clinic at [phone] with any questions or concerns prior to her return visit.   I spent 30 minutes assessing and educating the patient.  Andrell Tallman A. Odetta Pink, PharmD, BCOP, CPP  Anselm Lis, RPH-CPP, 06/19/2023  1:52 PM   **Disclaimer: This note was dictated with voice recognition software. Similar sounding words can inadvertently be transcribed and this note may contain transcription errors which may not have been corrected upon publication of note.**

## 2023-06-19 NOTE — Telephone Encounter (Signed)
Scheduled appointment per 7/16 los. Patient is aware of the made appointment.

## 2023-08-10 ENCOUNTER — Other Ambulatory Visit: Payer: Self-pay | Admitting: Hematology and Oncology

## 2023-09-27 ENCOUNTER — Other Ambulatory Visit: Payer: Self-pay | Admitting: *Deleted

## 2023-09-27 DIAGNOSIS — Z170 Estrogen receptor positive status [ER+]: Secondary | ICD-10-CM

## 2023-10-01 ENCOUNTER — Inpatient Hospital Stay: Payer: Federal, State, Local not specified - PPO | Attending: Adult Health

## 2023-10-01 ENCOUNTER — Inpatient Hospital Stay (HOSPITAL_BASED_OUTPATIENT_CLINIC_OR_DEPARTMENT_OTHER): Payer: Federal, State, Local not specified - PPO | Admitting: Hematology and Oncology

## 2023-10-01 VITALS — BP 128/76 | HR 60 | Temp 97.8°F | Resp 18 | Ht 63.5 in | Wt 141.4 lb

## 2023-10-01 DIAGNOSIS — Z9221 Personal history of antineoplastic chemotherapy: Secondary | ICD-10-CM | POA: Diagnosis not present

## 2023-10-01 DIAGNOSIS — Z90722 Acquired absence of ovaries, bilateral: Secondary | ICD-10-CM | POA: Insufficient documentation

## 2023-10-01 DIAGNOSIS — C50211 Malignant neoplasm of upper-inner quadrant of right female breast: Secondary | ICD-10-CM | POA: Diagnosis not present

## 2023-10-01 DIAGNOSIS — Z923 Personal history of irradiation: Secondary | ICD-10-CM | POA: Insufficient documentation

## 2023-10-01 DIAGNOSIS — Z170 Estrogen receptor positive status [ER+]: Secondary | ICD-10-CM

## 2023-10-01 DIAGNOSIS — Z9013 Acquired absence of bilateral breasts and nipples: Secondary | ICD-10-CM | POA: Insufficient documentation

## 2023-10-01 DIAGNOSIS — D649 Anemia, unspecified: Secondary | ICD-10-CM | POA: Insufficient documentation

## 2023-10-01 DIAGNOSIS — D72819 Decreased white blood cell count, unspecified: Secondary | ICD-10-CM | POA: Diagnosis not present

## 2023-10-01 DIAGNOSIS — Z79811 Long term (current) use of aromatase inhibitors: Secondary | ICD-10-CM | POA: Diagnosis not present

## 2023-10-01 DIAGNOSIS — Z1721 Progesterone receptor positive status: Secondary | ICD-10-CM | POA: Insufficient documentation

## 2023-10-01 LAB — CMP (CANCER CENTER ONLY)
ALT: 9 U/L (ref 0–44)
AST: 18 U/L (ref 15–41)
Albumin: 4.5 g/dL (ref 3.5–5.0)
Alkaline Phosphatase: 49 U/L (ref 38–126)
Anion gap: 5 (ref 5–15)
BUN: 13 mg/dL (ref 6–20)
CO2: 31 mmol/L (ref 22–32)
Calcium: 9.9 mg/dL (ref 8.9–10.3)
Chloride: 102 mmol/L (ref 98–111)
Creatinine: 1.02 mg/dL — ABNORMAL HIGH (ref 0.44–1.00)
GFR, Estimated: >60 mL/min (ref >60)
Glucose, Bld: 97 mg/dL (ref 70–99)
Potassium: 3.9 mmol/L (ref 3.5–5.1)
Sodium: 138 mmol/L (ref 135–145)
Total Bilirubin: 0.3 mg/dL (ref 0.3–1.2)
Total Protein: 7.5 g/dL (ref 6.5–8.1)

## 2023-10-01 LAB — CBC WITH DIFFERENTIAL (CANCER CENTER ONLY)
Abs Immature Granulocytes: 0.01 10*3/uL (ref 0.00–0.07)
Basophils Absolute: 0 10*3/uL (ref 0.0–0.1)
Basophils Relative: 1 %
Eosinophils Absolute: 0 10*3/uL (ref 0.0–0.5)
Eosinophils Relative: 1 %
HCT: 34 % — ABNORMAL LOW (ref 36.0–46.0)
Hemoglobin: 11.8 g/dL — ABNORMAL LOW (ref 12.0–15.0)
Immature Granulocytes: 0 %
Lymphocytes Relative: 42 %
Lymphs Abs: 1.4 10*3/uL (ref 0.7–4.0)
MCH: 34.5 pg — ABNORMAL HIGH (ref 26.0–34.0)
MCHC: 34.7 g/dL (ref 30.0–36.0)
MCV: 99.4 fL (ref 80.0–100.0)
Monocytes Absolute: 0.3 10*3/uL (ref 0.1–1.0)
Monocytes Relative: 8 %
Neutro Abs: 1.7 10*3/uL (ref 1.7–7.7)
Neutrophils Relative %: 48 %
Platelet Count: 195 10*3/uL (ref 150–400)
RBC: 3.42 MIL/uL — ABNORMAL LOW (ref 3.87–5.11)
RDW: 12.6 % (ref 11.5–15.5)
WBC Count: 3.5 10*3/uL — ABNORMAL LOW (ref 4.0–10.5)
nRBC: 0 % (ref 0.0–0.2)

## 2023-10-01 NOTE — Assessment & Plan Note (Signed)
04/08/2021:Palpable right breast mass: Mammogram and US showed a 4.5cm right breast mass at the 12-1 o'clock position with associated microcalcifications, multiple additional masses from the 9-11 o'clock position in the right breast, calcifications in the subareolar right breast, 1.4cm, and two enlarged right axillary lymph nodes. 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  Neoadjuvant chemotherapy with dose dense Adriamycin and Cytoxan x4 followed by Taxol weekly x12 completed 09/14/21 2. Bilateral mastectomies with targeted node dissection 10/21/21 Left: Benign Right: 6 cm residual IDC 2/7 LN Positive ER: 80%, PR 1%, Her 2 Neg 3.  Adjuvant radiation 12/01/2021-01/11/2022 4.  Oophorectomy 06/12/2022  ---------------------------------------------------------------------------------------------------------------------- Current treatment: Letrozole started 02/01/2022.  Verzinio started 08/19/2022   Letrozole toxicities: Tolerating letrozole extremely well without any problems or concerns.   Abemaciclib toxicities: Alternating diarrhea with constipation Mild leukopenia: Monitoring closely, today's ANC is 1.6 Mild anemia: Monitoring closely   Breast cancer surveillance: 1.  Breast exam 10/01/2023: Benign 2. mammogram: No role of imaging since she had bilateral mastectomies   Return to clinic in 3 months with labs and follow-up

## 2023-10-01 NOTE — Progress Notes (Signed)
Patient Care Team: Edwinna Areola, DO as PCP - General (Obstetrics and Gynecology) Lonie Peak, MD as Consulting Physician (Radiation Oncology) Emelia Loron, MD as Consulting Physician (General Surgery) Serena Croissant, MD as Consulting Physician (Hematology and Oncology) Anselm Lis, RPH-CPP as Pharmacist (Hematology and Oncology)  DIAGNOSIS:  Encounter Diagnosis  Name Primary?   Malignant neoplasm of upper-inner quadrant of right breast in female, estrogen receptor positive (HCC) Yes    SUMMARY OF ONCOLOGIC HISTORY: Oncology History  Malignant neoplasm of upper-inner quadrant of right breast in female, estrogen receptor positive (HCC)  04/08/2021 Initial Diagnosis   Palpable right breast mass: Mammogram and US showed a 4.5cm right breast mass at the 12-1 o'clock position with associated microcalcifications, multiple additional masses from the 9-11 o'clock position in the right breast, calcifications in the subareolar right breast, 1.4cm, and two enlarged right axillary lymph nodes. 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 Two variants of uncertain significance (VUS) were detected - one in the APC gene called p.T2422I (c.7265C>T) and a second in the BAP1 gene called p.P302L (c.905C>T). The report dates are 04/19/2021 and 04/25/2021, respectively.  The BRCAplus panel offered by [employer] and includes sequencing and deletion/duplication analysis for the following 8  genes: ATM, BRCA1, BRCA2, CDH1, CHEK2, PALB2, PTEN, and TP53. The CancerNext-Expanded + RNAinsight gene panel offered by [employer] and includes sequencing and rearrangement analysis for the following 77 genes: AIP, ALK, APC, ATM, AXIN2, BAP1, BARD1, BLM, BMPR1A, BRCA1, BRCA2, BRIP1, CDC73, CDH1, CDK4, CDKN1B, CDKN2A, CHEK2, CTNNA1, DICER1, FANCC, FH, FLCN, GALNT12, KIF1B, LZTR1, MAX, MEN1, MET, MLH1, MSH2, MSH3, MSH6, MUTYH, NBN, NF1, NF2, NTHL1, PALB2, PHOX2B, PMS2, POT1, PRKAR1A, PTCH1, PTEN, RAD51C, RAD51D, RB1, RECQL, RET, SDHA, SDHAF2, SDHB, SDHC, SDHD, SMAD4, SMARCA4, SMARCB1, SMARCE1, STK11, SUFU, TMEM127, TP53, TSC1, TSC2, VHL and XRCC2 (sequencing and deletion/duplication); EGFR, EGLN1, HOXB13, KIT, MITF, PDGFRA, POLD1 and POLE (sequencing only); EPCAM and GREM1 (deletion/duplication only). RNA data is routinely analyzed for use in variant interpretation for all genes.    05/10/2021 - 09/20/2021 Chemotherapy   Patient is on Treatment Plan : BREAST ADJUVANT DOSE DENSE AC q14d / PACLitaxel q7d     10/21/2021 Surgery   Bilateral mastectomies: Left breast benign.  Right breast showed 6 cm of invasive ductal carcinoma, grade 2, margins negative, 2 out of 7 lymph nodes were positive for macrometastases.  ER positive, PR negative, HER2 negative.   10/21/2021 Cancer Staging   Staging form: Breast, AJCC 8th Edition - Pathologic stage from 10/21/2021: No Stage Recommended (ypT3, pN1a, cM0, G2, ER+, PR-, HER2-) - Signed by Loa Socks, NP on 04/03/2022 Stage prefix: Post-therapy Histologic grading system: 3 grade system   11/30/2021 - 01/11/2022 Radiation Therapy   Site Technique Total Dose (Gy) Dose per Fx (Gy) Completed Fx Beam Energies  Chest Wall, Right: CW_R_IMN 3D 50/50 2 25/25 6X, 10X  Chest Wall, Right: CW_R_PAB_SCV 3D 50/50 2 25/25 6X, 10X  Chest Wall, Right: CW_R_Bst Electron 10/10 2 5/5 6E  CHIEF COMPLIANT: Follow-up on letrozole with Verzenio  Discussed the use of  AI scribe software for clinical note transcription with the patient, who gave verbal consent to proceed.  History of Present Illness   The patient, on Verzenio with Letrozole for over a year, reports occasional diarrhea, particularly after eating greasy foods like pizza. She notes that the frequency of diarrhea has decreased compared to the initial stages of treatment. She also reports feeling tired, attributing it to her busy schedule with her children. The patient's blood work shows a slightly low white count, which has been a consistent finding in the past, and is currently stable at 3.5. Hemoglobin is slightly low at 11.8, but platelets are normal.       ALLERGIES:  has No Known Allergies.  MEDICATIONS:  Current Outpatient Medications  Medication Sig Dispense Refill   acetaminophen (TYLENOL) 500 MG tablet Take 500-1,000 mg by mouth every 6 (six) hours as needed for moderate pain.     Cholecalciferol (VITAMIN D3) 25 MCG (1000 UT) CAPS Take 1,000 Int'l Units by mouth daily.     letrozole (FEMARA) 2.5 MG tablet TAKE 1 TABLET BY MOUTH EVERY DAY 90 tablet 3   loperamide (IMODIUM) 1 MG/5ML solution Take 2 mg by mouth as needed for diarrhea or loose stools.     loteprednol (LOTEMAX) 0.5 % ophthalmic suspension 3 drops 3 (three) times daily.     Multiple Vitamin (MULTIVITAMIN WITH MINERALS) TABS tablet Take 2 tablets by mouth daily.     Noni, Morinda citrifolia, (NONI JUICE) 3000 MG/30ML LIQD Take 60 mLs by mouth daily.     ondansetron (ZOFRAN) 8 MG tablet Take 1 tablet (8 mg total) by mouth every 8 (eight) hours as needed for nausea or vomiting. 30 tablet 2   prochlorperazine (COMPAZINE) 10 MG tablet Take 1 tablet (10 mg total) by mouth every 6 (six) hours as needed for nausea or vomiting. 30 tablet 2   VERZENIO 100 MG tablet TAKE 1 TABLET BY MOUTH 2 TIMES A DAY 56 tablet 5   No current facility-administered medications for this visit.    PHYSICAL EXAMINATION: ECOG PERFORMANCE STATUS: 1 -  Symptomatic but completely ambulatory  Vitals:   10/01/23 1146  BP: 128/76  Pulse: 60  Resp: 18  Temp: 97.8 F (36.6 C)  SpO2: 100%   Filed Weights   10/01/23 1146  Weight: 141 lb 6.4 oz (64.1 kg)     LABORATORY DATA:  I have reviewed the data as listed    Latest Ref Rng & Units 10/01/2023   11:26 AM 06/19/2023   12:40 PM 04/17/2023   10:29 AM  CMP  Glucose 70 - 99 mg/dL 97  74  74   BUN 6 - 20 mg/dL 13  14  12    Creatinine 0.44 - 1.00 mg/dL 1.61  0.96  0.45   Sodium 135 - 145 mmol/L 138  141  141   Potassium 3.5 - 5.1 mmol/L 3.9  3.6  3.8   Chloride 98 - 111 mmol/L 102  105  104   CO2 22 - 32 mmol/L 31  31  31    Calcium 8.9 - 10.3 mg/dL 9.9  9.8  9.4   Total Protein 6.5 - 8.1 g/dL 7.5  7.7  7.5   Total Bilirubin 0.3 - 1.2 mg/dL 0.3  0.3  0.3   Alkaline Phos 38 - 126 U/L 49  53  46   AST 15 - 41 U/L 18  17  18  ALT 0 - 44 U/L 9  10  10      Lab Results  Component Value Date   WBC 3.5 (L) 10/01/2023   HGB 11.8 (L) 10/01/2023   HCT 34.0 (L) 10/01/2023   MCV 99.4 10/01/2023   PLT 195 10/01/2023   NEUTROABS 1.7 10/01/2023    ASSESSMENT & PLAN:  Malignant neoplasm of upper-inner quadrant of right breast in female, 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:Palpable right breast mass: Mammogram and US showed a 4.5cm right breast mass at the 12-1 o'clock position with associated microcalcifications, multiple additional masses from the 9-11 o'clock position in the right breast, calcifications in the subareolar right breast, 1.4cm, and two enlarged right axillary lymph nodes. 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  Neoadjuvant chemotherapy with dose dense Adriamycin and Cytoxan x4 followed by Taxol weekly  x12 completed 09/14/21 2. Bilateral mastectomies with targeted node dissection 10/21/21 Left: Benign Right: 6 cm residual IDC 2/7 LN Positive ER: 80%, PR 1%, Her 2 Neg 3.  Adjuvant radiation 12/01/2021-01/11/2022 4.  Oophorectomy 06/12/2022  ---------------------------------------------------------------------------------------------------------------------- Current treatment: Letrozole started 02/01/2022.  Verzinio started 08/19/2022   Letrozole toxicities: Tolerating letrozole extremely well without any problems or concerns.   Abemaciclib toxicities: Alternating diarrhea with constipation Mild leukopenia: Monitoring closely, today's ANC is 1.6 Mild anemia: Monitoring closely   Breast cancer surveillance: 1.  Breast exam 10/01/2023: Benign 2. mammogram: No role of imaging since she had bilateral mastectomies   Return to clinic in 3 months with labs and follow-up with Jonny Ruiz in 6 months with labs and follow-up with me.       Orders Placed This Encounter  Procedures   CBC with Differential (Cancer Center Only)    Standing Status:   Future    Standing Expiration Date:   09/30/2024   CMP (Cancer Center only)    Standing Status:   Future    Standing Expiration Date:   09/30/2024   The patient has a good understanding of the overall plan. she agrees with it. she will call with any problems that may develop before the next visit here. Total time spent: 30 mins including face to face time and time spent for planning, charting and co-ordination of care   Tamsen Meek, MD 10/01/23

## 2023-10-02 ENCOUNTER — Telehealth: Payer: Self-pay | Admitting: Hematology and Oncology

## 2023-10-02 NOTE — Telephone Encounter (Signed)
Per Pamelia Hoit 10/28 LOS Patient is aware of scheduled appointment times/dates

## 2023-10-15 ENCOUNTER — Ambulatory Visit: Payer: Federal, State, Local not specified - PPO | Attending: General Surgery

## 2023-10-15 VITALS — Wt 136.2 lb

## 2023-10-15 DIAGNOSIS — Z483 Aftercare following surgery for neoplasm: Secondary | ICD-10-CM | POA: Insufficient documentation

## 2023-10-15 NOTE — Therapy (Signed)
OUTPATIENT PHYSICAL THERAPY SOZO SCREENING NOTE   Patient Name: Chelsea Wise MRN: [id number] DOB:[birth year], 42 y.o., female Today's Date: 10/15/2023  PCP: Edwinna Areola, DO REFERRING PROVIDER: Emelia Loron, MD   PT End of Session - 10/15/23 1640     Visit Number 10   # unchanged due to screen only   PT Start Time 1638    PT Stop Time 1642    PT Time Calculation (min) 4 min    Activity Tolerance Patient tolerated treatment well    Behavior During Therapy [facility] for tasks assessed/performed             Past Medical History:  Diagnosis Date   Family history of breast cancer    Family history of lung cancer    Family history of pancreatic cancer    Family history of prostate cancer    History of cancer chemotherapy    right breast cancer  05-10-2021  to 09-20-2021   History of external beam radiation therapy    right breast 12-01-2021 to 01-11-2022   Irritation of right eye 06/01/2022   Malignant neoplasm of upper-inner quadrant of right breast in female, 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   oncologist--- dr Pamelia Hoit;  Stage IIA,  IDC/  DCIS;  completed chemo 09-20-2021 , completed radiation 01-11-2022   Wears contact lenses    Past Surgical History:  Procedure Laterality Date   LAPAROSCOPIC BILATERAL SALPINGO OOPHERECTOMY Bilateral 06/12/2022   Procedure: LAPAROSCOPIC BILATERAL SALPINGO OOPHORECTOMY;  Surgeon: Edwinna Areola, DO;  Location: [facility];  Service: Gynecology;  Laterality: Bilateral;   MASTECTOMY W/ SENTINEL NODE BIOPSY Right 10/21/2021   Procedure: RIGHT MASTECTOMY WITH RIGHT AXILLARY SENTINEL LYMPH NODE BIOPSY;  Surgeon: Emelia Loron, MD;  Location: [facility];  Service: General;  Laterality: Right;   PORT-A-CATH REMOVAL Left 10/21/2021   Procedure: REMOVAL PORT-A-CATH;  Surgeon: Emelia Loron, MD;  Location: [facility];  Service: General;  Laterality: Left;    PORTACATH PLACEMENT Left 05/09/2021   Procedure: INSERTION PORT-A-CATH;  Surgeon: Emelia Loron, MD;  Location[facility];  Service: General;  Laterality: Left;   RADIOACTIVE SEED GUIDED AXILLARY SENTINEL LYMPH NODE Right 10/21/2021   Procedure: RADIOACTIVE SEED GUIDED RIGHT AXILLARY LYMPH NODE EXCISION;  Surgeon: Emelia Loron, MD;  Location: [facility];  Service: General;  Laterality: Right;   TOTAL MASTECTOMY Left 10/21/2021   Procedure: LEFT TOTAL MASTECTOMY;  Surgeon: Emelia Loron, MD;  Location: [facility];  Service: General;  Laterality: Left;   WISDOM TOOTH EXTRACTION     Patient Active Problem List   Diagnosis Date Noted   S/P bilateral mastectomy 10/21/2021   Genetic testing 04/19/2021   Malignant neoplasm of upper-inner quadrant of right breast in female, estrogen receptor positive (HCC) 04/13/2021   Family history of prostate cancer    Family history of pancreatic cancer    Family history of breast cancer    Family history of lung cancer    Active labor 03/15/2016   Postpartum care following vaginal delivery 03/15/2016   SVD (spontaneous vaginal delivery) 01/18/2014   Labor and delivery, indication for care 01/17/2014   Antepartum bleeding, third trimester 01/01/2014   Placenta previa in second trimester 11/28/2013    REFERRING DIAG: right breast cancer at risk for lymphedema  THERAPY DIAG: Aftercare following surgery for neoplasm  PERTINENT HISTORY: Patient was diagnosed on 04/11/2021 with right grade II invasive ductal carcinoma breast cancer. It measures 4.5 cm and is located  in the upper inner quadrant. It is ER positive, weakly PR positive, and HER2 equivocal. Ki67 is 25%. She has 2 abnormal appearing axillary lymph nodes. One was biopsied and found to be positive.   PRECAUTIONS: right UE Lymphedema risk, None  SUBJECTIVE: Pt returns for her 3 month L-Dex screen.   PAIN:  Are you having pain? No  SOZO SCREENING: Patient  was assessed today using the SOZO machine to determine the lymphedema index score. This was compared to her baseline score. It was determined that she is within the recommended range when compared to her baseline and no further action is needed at this time. She will continue SOZO screenings. These are done every 3 months for 2 years post operatively followed by every 6 months for 2 years, and then annually.     L-DEX FLOWSHEETS - 10/15/23 1600       L-DEX LYMPHEDEMA SCREENING   Measurement Type Unilateral    L-DEX MEASUREMENT EXTREMITY Upper Extremity    POSITION  Standing    DOMINANT SIDE Right    At Risk Side Right    BASELINE SCORE (UNILATERAL) 1.6    L-DEX SCORE (UNILATERAL) 5.2    VALUE CHANGE (UNILAT) 3.6              Hermenia Bers, PTA 10/15/2023, 4:41 PM

## 2023-11-20 ENCOUNTER — Telehealth: Payer: Self-pay | Admitting: Pharmacy Technician

## 2023-11-20 NOTE — Telephone Encounter (Signed)
Oral Oncology Patient Advocate Encounter   Received notification that prior authorization for Verzenio is due for renewal.   PA submitted on 11/20/23 Key [id number] Status is pending     Jinger Neighbors, CPhT-Adv Oncology Pharmacy Patient Advocate [facility] Direct Number: [phone]  Fax: [phone]

## 2023-11-21 NOTE — Telephone Encounter (Signed)
Oral Oncology Patient Advocate Encounter  Prior Authorization for [name] has been approved.    PA# [id number] Effective dates: 11/20/23 through 11/18/24  Patient must continue to fill at [facility].    Jinger Neighbors, CPhT-Adv Oncology Pharmacy Patient Advocate [facility] Direct Number: [phone]  Fax: [phone]

## 2023-12-17 ENCOUNTER — Inpatient Hospital Stay: Payer: BC Managed Care – PPO | Attending: Adult Health

## 2023-12-17 ENCOUNTER — Inpatient Hospital Stay: Payer: BC Managed Care – PPO | Admitting: Pharmacist

## 2023-12-17 VITALS — BP 108/57 | HR 62 | Temp 97.6°F | Resp 16 | Wt 142.7 lb

## 2023-12-17 DIAGNOSIS — Z1732 Human epidermal growth factor receptor 2 negative status: Secondary | ICD-10-CM | POA: Insufficient documentation

## 2023-12-17 DIAGNOSIS — Z1721 Progesterone receptor positive status: Secondary | ICD-10-CM | POA: Diagnosis not present

## 2023-12-17 DIAGNOSIS — Z170 Estrogen receptor positive status [ER+]: Secondary | ICD-10-CM | POA: Diagnosis not present

## 2023-12-17 DIAGNOSIS — C50211 Malignant neoplasm of upper-inner quadrant of right female breast: Secondary | ICD-10-CM | POA: Diagnosis present

## 2023-12-17 DIAGNOSIS — Z79811 Long term (current) use of aromatase inhibitors: Secondary | ICD-10-CM | POA: Diagnosis not present

## 2023-12-17 LAB — CBC WITH DIFFERENTIAL (CANCER CENTER ONLY)
Abs Immature Granulocytes: 0.01 10*3/uL (ref 0.00–0.07)
Basophils Absolute: 0 10*3/uL (ref 0.0–0.1)
Basophils Relative: 1 %
Eosinophils Absolute: 0 10*3/uL (ref 0.0–0.5)
Eosinophils Relative: 0 %
HCT: 35.1 % — ABNORMAL LOW (ref 36.0–46.0)
Hemoglobin: 12.1 g/dL (ref 12.0–15.0)
Immature Granulocytes: 0 %
Lymphocytes Relative: 42 %
Lymphs Abs: 1.5 10*3/uL (ref 0.7–4.0)
MCH: 33.7 pg (ref 26.0–34.0)
MCHC: 34.5 g/dL (ref 30.0–36.0)
MCV: 97.8 fL (ref 80.0–100.0)
Monocytes Absolute: 0.2 10*3/uL (ref 0.1–1.0)
Monocytes Relative: 7 %
Neutro Abs: 1.8 10*3/uL (ref 1.7–7.7)
Neutrophils Relative %: 50 %
Platelet Count: 199 10*3/uL (ref 150–400)
RBC: 3.59 MIL/uL — ABNORMAL LOW (ref 3.87–5.11)
RDW: 12.6 % (ref 11.5–15.5)
Smear Review: NORMAL
WBC Count: 3.7 10*3/uL — ABNORMAL LOW (ref 4.0–10.5)
nRBC: 0 % (ref 0.0–0.2)

## 2023-12-17 LAB — CMP (CANCER CENTER ONLY)
ALT: 10 U/L (ref 0–44)
AST: 17 U/L (ref 15–41)
Albumin: 4.4 g/dL (ref 3.5–5.0)
Alkaline Phosphatase: 50 U/L (ref 38–126)
Anion gap: 4 — ABNORMAL LOW (ref 5–15)
BUN: 12 mg/dL (ref 6–20)
CO2: 32 mmol/L (ref 22–32)
Calcium: 9.8 mg/dL (ref 8.9–10.3)
Chloride: 102 mmol/L (ref 98–111)
Creatinine: 0.99 mg/dL (ref 0.44–1.00)
GFR, Estimated: >60 mL/min (ref >60)
Glucose, Bld: 89 mg/dL (ref 70–99)
Potassium: 3.9 mmol/L (ref 3.5–5.1)
Sodium: 138 mmol/L (ref 135–145)
Total Bilirubin: 0.3 mg/dL (ref 0.0–1.2)
Total Protein: 7.5 g/dL (ref 6.5–8.1)

## 2023-12-17 NOTE — Progress Notes (Signed)
 [facility]       Telephone: [phone]?Fax: [phone]   Oncology Clinical Pharmacist Practitioner Progress Note  Chelsea Wise was contacted via in-person to discuss her chemotherapy regimen for abemaciclib  which they receive under the care of Dr. Vinay Gudena.   Current treatment regimen and start date Abemaciclib  (08/19/2022) Letrozole  (02/01/2022)   Interval History She continues on abemaciclib  100 mg by mouth every 12 hours on days 1 to 28 of a 28-day cycle. This is being given in combination with letrozole  2.5 mg by mouth daily. Therapy is planned to continue until 2 years in the adjuvant setting per the monarchE trial. She last saw clinical pharmacy on 06/19/23 and last saw Dr. Odean on 10/01/23. She reported some nausea and constipation at her last visit with Dr. Odean. He wanted to see her back in 6 months.   Response to Therapy Chelsea Wise is doing very well. Her loose stools are also controlled using loperamide and watching what she eats. Occasionally, she will have pizza or fettuccine alfredo which appear to be her triggers for loose stool. She is not experiencing any other side effects at this time. She will see Dr. Odean with labs in 3 months (03/18/24). She knows she can contact the clinic sooner should she have questions or concerns in the interim. No changes in her medication list and she is doing well from an adherence perspective. We will order a DEXA scan which can be done around her visit with Dr. Odean in April. Labs, vitals, treatment parameters, and manufacturer guidelines assessing toxicity were reviewed with Chelsea Wise today. Based on these values, patient is in agreement to continue abemaciclib  therapy at this time.  Allergies No Known Allergies  Vitals    12/17/2023    1:08 PM 10/15/2023    4:40 PM 10/01/2023   11:46 AM  Oncology Vitals  Height   161 cm  Weight 64.728 kg 61.803 kg 64.139 kg  Weight (lbs) 142 lbs 11 oz 136 lbs  4 oz 141 lbs 6 oz  BMI 24.88 kg/m2 23.76 kg/m2 24.65 kg/m2  Temp 97.6 F (36.4 C)  97.8 F (36.6 C)  Pulse Rate 62  60  BP 108/57  128/76  Resp 16  18  SpO2 100 %  100 %  BSA (m2) 1.7 m2 1.66 m2 1.69 m2    Laboratory Data    Latest Ref Rng & Units 12/17/2023   12:34 PM 10/01/2023   11:26 AM 06/19/2023   12:40 PM  CBC EXTENDED  WBC 4.0 - 10.5 K/uL 3.7  3.5  3.8   RBC 3.87 - 5.11 MIL/uL 3.59  3.42  3.67   Hemoglobin 12.0 - 15.0 g/dL 87.8  88.1  87.3   HCT 36.0 - 46.0 % 35.1  34.0  37.1   Platelets 150 - 400 K/uL 199  195  203   NEUT# 1.7 - 7.7 K/uL 1.8  1.7  2.2   Lymph# 0.7 - 4.0 K/uL 1.5  1.4  1.3        Latest Ref Rng & Units 12/17/2023   12:34 PM 10/01/2023   11:26 AM 06/19/2023   12:40 PM  CMP  Glucose 70 - 99 mg/dL 89  97  74   BUN 6 - 20 mg/dL 12  13  14    Creatinine 0.44 - 1.00 mg/dL 9.00  8.97  9.04   Sodium 135 - 145 mmol/L 138  138  141   Potassium 3.5 - 5.1  mmol/L 3.9  3.9  3.6   Chloride 98 - 111 mmol/L 102  102  105   CO2 22 - 32 mmol/L 32  31  31   Calcium  8.9 - 10.3 mg/dL 9.8  9.9  9.8   Total Protein 6.5 - 8.1 g/dL 7.5  7.5  7.7   Total Bilirubin 0.0 - 1.2 mg/dL 0.3  0.3  0.3   Alkaline Phos 38 - 126 U/L 50  49  53   AST 15 - 41 U/L 17  18  17    ALT 0 - 44 U/L 10  9  10      Adverse Effects Assessment Loose stools: managed with PRN loperamide and diet  Adherence Assessment Chelsea Wise reports missing 0 doses over the past 8 weeks.   Reason for missed dose: N/A Patient was re-educated on importance of adherence.   Access Assessment Chelsea Wise is currently receiving her abemaciclib  through [facility] concerns:  none  Medication Reconciliation The patient's medication list was reviewed today with the patient? Yes New medications or herbal supplements have recently been started? No  Any medications have been discontinued? No  The medication list was updated and reconciled based on the patient's most  recent medication list in the electronic medical record (EMR) including herbal products and OTC medications.   Medications Current Outpatient Medications  Medication Sig Dispense Refill   letrozole  (FEMARA ) 2.5 MG tablet TAKE 1 TABLET BY MOUTH EVERY DAY 90 tablet 3   Multiple Vitamin (MULTIVITAMIN WITH MINERALS) TABS tablet Take 2 tablets by mouth daily.     Noni, Morinda citrifolia, (NONI JUICE) 3000 MG/30ML LIQD Take 60 mLs by mouth daily.     VERZENIO  100 MG tablet TAKE 1 TABLET BY MOUTH 2 TIMES A DAY 56 tablet 5   acetaminophen  (TYLENOL ) 500 MG tablet Take 500-1,000 mg by mouth every 6 (six) hours as needed for moderate pain. (Patient not taking: Reported on 12/17/2023)     Cholecalciferol (VITAMIN D3) 25 MCG (1000 UT) CAPS Take 1,000 Int'l Units by mouth daily. (Patient not taking: Reported on 12/17/2023)     loperamide (IMODIUM) 1 MG/5ML solution Take 2 mg by mouth as needed for diarrhea or loose stools. (Patient not taking: Reported on 12/17/2023)     loteprednol (LOTEMAX) 0.5 % ophthalmic suspension 3 drops 3 (three) times daily. (Patient not taking: Reported on 12/17/2023)     ondansetron  (ZOFRAN ) 8 MG tablet Take 1 tablet (8 mg total) by mouth every 8 (eight) hours as needed for nausea or vomiting. (Patient not taking: Reported on 12/17/2023) 30 tablet 2   prochlorperazine  (COMPAZINE ) 10 MG tablet Take 1 tablet (10 mg total) by mouth every 6 (six) hours as needed for nausea or vomiting. (Patient not taking: Reported on 12/17/2023) 30 tablet 2   No current facility-administered medications for this visit.   Drug-Drug Interactions (DDIs) DDIs were evaluated? Yes Significant DDIs? No  The patient was instructed to speak with their health care provider and/or the oral chemotherapy pharmacist before starting any new drug, including prescription or over the counter, natural / herbal products, or vitamins.  Supportive Care Diarrhea: we reviewed that diarrhea is common with abemaciclib  and  confirmed that she does have loperamide (Imodium) at home.  We reviewed how to take this medication PRN. Neutropenia: we discussed the importance of having a thermometer and what the Centers for Disease Control and Prevention (CDC) considers a fever which is 100.3F (38C) or higher.  Gave  patient 24/7 triage line to call if any fevers or symptoms. ILD/Pneumonitis: we reviewed potential symptoms including cough, shortness, and fatigue.  VTE: reviewed signs of DVT such as leg swelling, redness, pain, or tenderness and signs of PE such as shortness of breath, rapid or irregular heartbeat, cough, chest pain, or lightheadedness. Reviewed to take the medication every 12 hours (with food sometimes can be easier on the stomach) and to take it at the same time every day. Hepatotoxicity:WNL Drug interactions with grapefruit products  Dosing Assessment Hepatic adjustments needed? No  Renal adjustments needed? No  Toxicity adjustments needed? No  The current dosing regimen is appropriate to continue at this time.  Follow-Up Plan Continue abemaciclib  100 mg by mouth every 12 hours. Continue letrozole  2.5 mg by mouth daily Continue loperamide as needed for loose stool.   Labs, Dr. Odean visit scheduled for 03/17/24. Will review DEXA scan at that time.  She finishes abemaciclib  tentatively on 08/18/24 but asked if she should continue through October since she missed several days when she had COVID in December 2023.  Chelsea Wise participated in the discussion, expressed understanding, and voiced agreement with the above plan. All questions were answered to her satisfaction. The patient was advised to contact the clinic at [phone] with any questions or concerns prior to her return visit.   I spent 30 minutes assessing and educating the patient.  Thana Ramp A. Lucila, PharmD, BCOP, CPP  Norleen DELENA Lucila, RPH-CPP, 12/17/2023  1:31 PM   **Disclaimer: This note was dictated with voice recognition  software. Similar sounding words can inadvertently be transcribed and this note may contain transcription errors which may not have been corrected upon publication of note.**

## 2024-01-02 ENCOUNTER — Telehealth: Payer: Self-pay | Admitting: Pharmacy Technician

## 2024-01-02 NOTE — Telephone Encounter (Signed)
Oral Oncology Patient Advocate Encounter   Was successful in obtaining a copay card for Verzenio.  This copay card will make the patients copay $0.  I have spoken with the patient.    The billing information is as follows and has been shared with [facility].   RxBin: [id number] PCN: [id number] Member ID: [id number] Group ID: [id number]   Jinger Neighbors, CPhT-Adv Oncology Pharmacy Patient Advocate [facility] Direct Number: [phone]  Fax: [phone]

## 2024-02-01 ENCOUNTER — Other Ambulatory Visit: Payer: Self-pay | Admitting: Hematology and Oncology

## 2024-03-17 ENCOUNTER — Other Ambulatory Visit: Payer: Self-pay | Admitting: *Deleted

## 2024-03-17 DIAGNOSIS — Z170 Estrogen receptor positive status [ER+]: Secondary | ICD-10-CM

## 2024-03-18 ENCOUNTER — Inpatient Hospital Stay: Payer: Federal, State, Local not specified - PPO | Attending: Adult Health

## 2024-03-18 ENCOUNTER — Inpatient Hospital Stay (HOSPITAL_BASED_OUTPATIENT_CLINIC_OR_DEPARTMENT_OTHER): Payer: Federal, State, Local not specified - PPO | Admitting: Hematology and Oncology

## 2024-03-18 VITALS — BP 131/76 | HR 62 | Temp 97.6°F | Resp 18 | Ht 63.5 in | Wt 140.0 lb

## 2024-03-18 DIAGNOSIS — C50211 Malignant neoplasm of upper-inner quadrant of right female breast: Secondary | ICD-10-CM | POA: Insufficient documentation

## 2024-03-18 DIAGNOSIS — Z79811 Long term (current) use of aromatase inhibitors: Secondary | ICD-10-CM | POA: Diagnosis not present

## 2024-03-18 DIAGNOSIS — Z1732 Human epidermal growth factor receptor 2 negative status: Secondary | ICD-10-CM | POA: Insufficient documentation

## 2024-03-18 DIAGNOSIS — Z1721 Progesterone receptor positive status: Secondary | ICD-10-CM | POA: Diagnosis not present

## 2024-03-18 DIAGNOSIS — D72819 Decreased white blood cell count, unspecified: Secondary | ICD-10-CM | POA: Diagnosis not present

## 2024-03-18 DIAGNOSIS — Z9221 Personal history of antineoplastic chemotherapy: Secondary | ICD-10-CM | POA: Insufficient documentation

## 2024-03-18 DIAGNOSIS — D649 Anemia, unspecified: Secondary | ICD-10-CM | POA: Diagnosis not present

## 2024-03-18 DIAGNOSIS — Z170 Estrogen receptor positive status [ER+]: Secondary | ICD-10-CM

## 2024-03-18 DIAGNOSIS — K5900 Constipation, unspecified: Secondary | ICD-10-CM | POA: Diagnosis not present

## 2024-03-18 DIAGNOSIS — Z9013 Acquired absence of bilateral breasts and nipples: Secondary | ICD-10-CM | POA: Insufficient documentation

## 2024-03-18 DIAGNOSIS — Z923 Personal history of irradiation: Secondary | ICD-10-CM | POA: Insufficient documentation

## 2024-03-18 DIAGNOSIS — R197 Diarrhea, unspecified: Secondary | ICD-10-CM | POA: Insufficient documentation

## 2024-03-18 LAB — CMP (CANCER CENTER ONLY)
ALT: 12 U/L (ref 0–44)
AST: 19 U/L (ref 15–41)
Albumin: 4.5 g/dL (ref 3.5–5.0)
Alkaline Phosphatase: 49 U/L (ref 38–126)
Anion gap: 3 — ABNORMAL LOW (ref 5–15)
BUN: 12 mg/dL (ref 6–20)
CO2: 32 mmol/L (ref 22–32)
Calcium: 9.5 mg/dL (ref 8.9–10.3)
Chloride: 106 mmol/L (ref 98–111)
Creatinine: 1.03 mg/dL — ABNORMAL HIGH (ref 0.44–1.00)
GFR, Estimated: >60 mL/min (ref >60)
Glucose, Bld: 82 mg/dL (ref 70–99)
Potassium: 3.4 mmol/L — ABNORMAL LOW (ref 3.5–5.1)
Sodium: 141 mmol/L (ref 135–145)
Total Bilirubin: 0.3 mg/dL (ref 0.0–1.2)
Total Protein: 7.6 g/dL (ref 6.5–8.1)

## 2024-03-18 LAB — CBC WITH DIFFERENTIAL (CANCER CENTER ONLY)
Abs Immature Granulocytes: 0 10*3/uL (ref 0.00–0.07)
Basophils Absolute: 0 10*3/uL (ref 0.0–0.1)
Basophils Relative: 0 %
Eosinophils Absolute: 0 10*3/uL (ref 0.0–0.5)
Eosinophils Relative: 1 %
HCT: 34.1 % — ABNORMAL LOW (ref 36.0–46.0)
Hemoglobin: 11.8 g/dL — ABNORMAL LOW (ref 12.0–15.0)
Immature Granulocytes: 0 %
Lymphocytes Relative: 43 %
Lymphs Abs: 1.6 10*3/uL (ref 0.7–4.0)
MCH: 34.4 pg — ABNORMAL HIGH (ref 26.0–34.0)
MCHC: 34.6 g/dL (ref 30.0–36.0)
MCV: 99.4 fL (ref 80.0–100.0)
Monocytes Absolute: 0.2 10*3/uL (ref 0.1–1.0)
Monocytes Relative: 5 %
Neutro Abs: 1.9 10*3/uL (ref 1.7–7.7)
Neutrophils Relative %: 51 %
Platelet Count: 185 10*3/uL (ref 150–400)
RBC: 3.43 MIL/uL — ABNORMAL LOW (ref 3.87–5.11)
RDW: 12.6 % (ref 11.5–15.5)
WBC Count: 3.8 10*3/uL — ABNORMAL LOW (ref 4.0–10.5)
nRBC: 0 % (ref 0.0–0.2)

## 2024-03-18 NOTE — Progress Notes (Signed)
 Patient Care Team: Edwinna Areola, DO as PCP - General (Obstetrics and Gynecology) Lonie Peak, MD as Consulting Physician (Radiation Oncology) Emelia Loron, MD as Consulting Physician (General Surgery) Serena Croissant, MD as Consulting Physician (Hematology and Oncology) Anselm Lis, RPH-CPP as Pharmacist (Hematology and Oncology)  DIAGNOSIS:  Encounter Diagnosis  Name Primary?   Malignant neoplasm of upper-inner quadrant of right breast in female, estrogen receptor positive (HCC) Yes    SUMMARY OF ONCOLOGIC HISTORY: Oncology History  Malignant neoplasm of upper-inner quadrant of right breast in female, estrogen receptor positive (HCC)  04/08/2021 Initial Diagnosis   Palpable right breast mass: Mammogram and US showed a 4.5cm right breast mass at the 12-1 o'clock position with associated microcalcifications, multiple additional masses from the 9-11 o'clock position in the right breast, calcifications in the subareolar right breast, 1.4cm, and two enlarged right axillary lymph nodes. Biopsy showed invasive and in situ ductal carcinoma in the breast and axilla, grade 2 ER 80%, PR 1%, Ki-67 25%, HER2 equivalent by [facility]   04/08/2021 Cancer Staging   Staging form: Breast, AJCC 8th Edition - Clinical stage from 04/08/2021: Stage IIA (cT2, cN1, cM0, G2, ER+, PR+, HER2-) - Signed by Serena Croissant, MD on 04/13/2021 Stage prefix: Initial diagnosis Histologic grading system: 3 grade system   04/19/2021 Genetic Testing   Negative genetic testing:  No pathogenic variants detected on the [employer] BRCAplus or CancerNext-Expanded + RNAinsight panels. Two variants of uncertain significance (VUS) were detected - one in the APC gene called p.T2422I (c.7265C>T) and a second in the BAP1 gene called p.P302L (c.905C>T). The report dates are 04/19/2021 and 04/25/2021, respectively.  The BRCAplus panel offered by [employer] and includes sequencing and deletion/duplication analysis for the following 8  genes: ATM, BRCA1, BRCA2, CDH1, CHEK2, PALB2, PTEN, and TP53. The CancerNext-Expanded + RNAinsight gene panel offered by [employer] and includes sequencing and rearrangement analysis for the following 77 genes: AIP, ALK, APC, ATM, AXIN2, BAP1, BARD1, BLM, BMPR1A, BRCA1, BRCA2, BRIP1, CDC73, CDH1, CDK4, CDKN1B, CDKN2A, CHEK2, CTNNA1, DICER1, FANCC, FH, FLCN, GALNT12, KIF1B, LZTR1, MAX, MEN1, MET, MLH1, MSH2, MSH3, MSH6, MUTYH, NBN, NF1, NF2, NTHL1, PALB2, PHOX2B, PMS2, POT1, PRKAR1A, PTCH1, PTEN, RAD51C, RAD51D, RB1, RECQL, RET, SDHA, SDHAF2, SDHB, SDHC, SDHD, SMAD4, SMARCA4, SMARCB1, SMARCE1, STK11, SUFU, TMEM127, TP53, TSC1, TSC2, VHL and XRCC2 (sequencing and deletion/duplication); EGFR, EGLN1, HOXB13, KIT, MITF, PDGFRA, POLD1 and POLE (sequencing only); EPCAM and GREM1 (deletion/duplication only). RNA data is routinely analyzed for use in variant interpretation for all genes.    05/10/2021 - 09/20/2021 Chemotherapy   Patient is on Treatment Plan : BREAST ADJUVANT DOSE DENSE AC q14d / PACLitaxel q7d     10/21/2021 Surgery   Bilateral mastectomies: Left breast benign.  Right breast showed 6 cm of invasive ductal carcinoma, grade 2, margins negative, 2 out of 7 lymph nodes were positive for macrometastases.  ER positive, PR negative, HER2 negative.   10/21/2021 Cancer Staging   Staging form: Breast, AJCC 8th Edition - Pathologic stage from 10/21/2021: No Stage Recommended (ypT3, pN1a, cM0, G2, ER+, PR-, HER2-) - Signed by Loa Socks, NP on 04/03/2022 Stage prefix: Post-therapy Histologic grading system: 3 grade system   11/30/2021 - 01/11/2022 Radiation Therapy   Site Technique Total Dose (Gy) Dose per Fx (Gy) Completed Fx Beam Energies  Chest Wall, Right: CW_R_IMN 3D 50/50 2 25/25 6X, 10X  Chest Wall, Right: CW_R_PAB_SCV 3D 50/50 2 25/25 6X, 10X  Chest Wall, Right: CW_R_Bst Electron 10/10 2 5/5 6E  CHIEF COMPLIANT: Follow-up on Verzinio  HISTORY OF PRESENT ILLNESS:    History of Present Illness The patient, currently on [place], reports issues with medication adherence since the recent time change, leading to occasional missed doses. This has resulted in intermittent diarrhea, which she attributes to the missed doses of Verzinio. Despite these issues, the patient reports feeling generally well. The patient's recent blood work shows slightly low potassium levels and slightly elevated kidney levels, but overall, the results are within normal limits. The patient also inquires about a shadowing program for her nephew who is interested in becoming a doctor.     ALLERGIES:  has no known allergies.  MEDICATIONS:  Current Outpatient Medications  Medication Sig Dispense Refill   acetaminophen (TYLENOL) 500 MG tablet Take 500-1,000 mg by mouth every 6 (six) hours as needed for moderate pain. (Patient not taking: Reported on 12/17/2023)     Cholecalciferol (VITAMIN D3) 25 MCG (1000 UT) CAPS Take 1,000 Int'l Units by mouth daily. (Patient not taking: Reported on 12/17/2023)     letrozole (FEMARA) 2.5 MG tablet TAKE 1 TABLET BY MOUTH EVERY DAY 90 tablet 3   loperamide (IMODIUM) 1 MG/5ML solution Take 2 mg by mouth as needed for diarrhea or loose stools. (Patient not taking: Reported on 12/17/2023)     loteprednol (LOTEMAX) 0.5 % ophthalmic suspension 3 drops 3 (three) times daily. (Patient not taking: Reported on 12/17/2023)     Multiple Vitamin (MULTIVITAMIN WITH MINERALS) TABS tablet Take 2 tablets by mouth daily.     Noni, Morinda citrifolia, (NONI JUICE) 3000 MG/30ML LIQD Take 60 mLs by mouth daily.     ondansetron (ZOFRAN) 8 MG tablet Take 1 tablet (8 mg total) by mouth every 8 (eight) hours as needed for nausea or vomiting. (Patient not taking: Reported on 12/17/2023) 30 tablet 2   prochlorperazine (COMPAZINE) 10 MG tablet Take 1 tablet (10 mg total) by mouth every 6 (six) hours as needed for nausea or vomiting. (Patient not taking: Reported on 12/17/2023) 30 tablet 2    VERZENIO 100 MG tablet TAKE 1 TABLET BY MOUTH 2 TIMES A DAY 56 tablet 6   No current facility-administered medications for this visit.    PHYSICAL EXAMINATION: ECOG PERFORMANCE STATUS: 1 - Symptomatic but completely ambulatory  Vitals:   03/18/24 1100  BP: 131/76  Pulse: 62  Resp: 18  Temp: 97.6 F (36.4 C)  SpO2: 100%   Filed Weights   03/18/24 1100  Weight: 140 lb (63.5 kg)      LABORATORY DATA:  I have reviewed the data as listed    Latest Ref Rng & Units 03/18/2024   11:21 AM 12/17/2023   12:34 PM 10/01/2023   11:26 AM  CMP  Glucose 70 - 99 mg/dL 82  89  97   BUN 6 - 20 mg/dL 12  12  13    Creatinine 0.44 - 1.00 mg/dL 1.61  0.96  0.45   Sodium 135 - 145 mmol/L 141  138  138   Potassium 3.5 - 5.1 mmol/L 3.4  3.9  3.9   Chloride 98 - 111 mmol/L 106  102  102   CO2 22 - 32 mmol/L 32  32  31   Calcium 8.9 - 10.3 mg/dL 9.5  9.8  9.9   Total Protein 6.5 - 8.1 g/dL 7.6  7.5  7.5   Total Bilirubin 0.0 - 1.2 mg/dL 0.3  0.3  0.3   Alkaline Phos 38 - 126 U/L 49  50  49  AST 15 - 41 U/L 19  17  18    ALT 0 - 44 U/L 12  10  9      Lab Results  Component Value Date   WBC 3.8 (L) 03/18/2024   HGB 11.8 (L) 03/18/2024   HCT 34.1 (L) 03/18/2024   MCV 99.4 03/18/2024   PLT 185 03/18/2024   NEUTROABS 1.9 03/18/2024    ASSESSMENT & PLAN:  Malignant neoplasm of upper-inner quadrant of right breast in female, 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:Palpable right breast mass: Mammogram and US  showed a 4.5cm right breast mass at the 12-1 o'clock position with associated microcalcifications, multiple additional masses from the 9-11 o'clock position in the right breast, calcifications in the subareolar right breast, 1.4cm, and two enlarged right axillary lymph nodes. Biopsy showed invasive and in situ ductal carcinoma in the breast and axilla, grade 2 ER 80%, PR 1%, Ki-67 25%, HER2 equivalent by IHC, FISH negative   Breast MRI 04/15/2021: 5.7 cm lobulated mass right breast with  enlarged right axillary lymph nodes (2 lymph nodes) MammaPrint: High risk   Treatment plan: 1.  Neoadjuvant chemotherapy with dose dense Adriamycin and Cytoxan x4 followed by Taxol weekly x12 completed 09/14/21 2. Bilateral mastectomies with targeted node dissection 10/21/21 Left: Benign Right: 6 cm residual IDC 2/7 LN Positive ER: 80%, PR 1%, Her 2 Neg 3.  Adjuvant radiation 12/01/2021-01/11/2022 4.  Oophorectomy 06/12/2022  ---------------------------------------------------------------------------------------------------------------------- Current treatment: Adjuvant letrozole started 02/01/2022.  Verzinio started 08/19/2022   Letrozole toxicities: Tolerating letrozole extremely well without any problems or concerns.   Abemaciclib toxicities: Alternating diarrhea with constipation Mild leukopenia: Monitoring closely, today's ANC is 1.9 Mild anemia: Monitoring closely   Breast cancer surveillance: 1.  Breast exam 03/18/2024: Benign 2. mammogram: No role of imaging since she had bilateral mastectomies   Return to clinic in 3 months with labs and follow-up with Autry Legions in 6 months with labs and follow-up with me. ------------------------------------- Assessment and Plan Assessment & Plan Malignant neoplasm of upper-inner quadrant of right breast, estrogen receptor positive On Verzenio and Letrozole. Diarrhea due to missed doses. Potassium slightly low at 3.4. Nearing completion of two-year treatment. New blood test program for recurrence monitoring available. - Continue Verzenio and Letrozole. - Encourage adherence to medication schedule. - Advise consumption of bananas for hypokalemia. - Enroll in blood test program for recurrence monitoring every six months.  Diarrhea Diarrhea linked to missed Verzenio doses. - Encourage strict adherence to Verzenio dosing schedule. - Use Loperamide as needed.      Orders Placed This Encounter  Procedures   CBC with Differential (Cancer Center  Only)    Standing Status:   Future    Expiration Date:   03/18/2025   CMP (Cancer Center only)    Standing Status:   Future    Expiration Date:   03/18/2025   The patient has a good understanding of the overall plan. she agrees with it. she will call with any problems that may develop before the next visit here. Total time spent: 30 mins including face to face time and time spent for planning, charting and co-ordination of care   Viinay K Anandi Abramo, MD 03/18/24

## 2024-03-18 NOTE — Assessment & Plan Note (Signed)
 04/08/2021:Palpable right breast mass: Mammogram and US  showed a 4.5cm right breast mass at the 12-1 o'clock position with associated microcalcifications, multiple additional masses from the 9-11 o'clock position in the right breast, calcifications in the subareolar right breast, 1.4cm, and two enlarged right axillary lymph nodes. 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  Neoadjuvant chemotherapy with dose dense Adriamycin and Cytoxan x4 followed by Taxol weekly x12 completed 09/14/21 2. Bilateral mastectomies with targeted node dissection 10/21/21 Left: Benign Right: 6 cm residual IDC 2/7 LN Positive ER: 80%, PR 1%, Her 2 Neg 3.  Adjuvant radiation 12/01/2021-01/11/2022 4.  Oophorectomy 06/12/2022  ---------------------------------------------------------------------------------------------------------------------- Current treatment: Adjuvant letrozole started 02/01/2022.  Verzinio started 08/19/2022   Letrozole toxicities: Tolerating letrozole extremely well without any problems or concerns.   Abemaciclib toxicities: Alternating diarrhea with constipation Mild leukopenia: Monitoring closely, today's ANC is 1.6 Mild anemia: Monitoring closely   Breast cancer surveillance: 1.  Breast exam 03/18/2024: Benign 2. mammogram: No role of imaging since she had bilateral mastectomies   Return to clinic in 3 months with labs and follow-up with Autry Legions in 6 months with labs and follow-up with me.

## 2024-04-14 ENCOUNTER — Ambulatory Visit: Payer: Federal, State, Local not specified - PPO | Attending: General Surgery

## 2024-04-14 VITALS — Wt 140.0 lb

## 2024-04-14 DIAGNOSIS — Z483 Aftercare following surgery for neoplasm: Secondary | ICD-10-CM | POA: Insufficient documentation

## 2024-04-14 NOTE — Therapy (Signed)
 OUTPATIENT PHYSICAL THERAPY SOZO SCREENING NOTE   Patient Name: Chelsea Wise MRN: [id number] DOB:Sep 23, 1981, 43 y.o., female Today's Date: 04/14/2024  PCP: Loa Riling, DO REFERRING PROVIDER: Enid Harry, MD   PT End of Session - 04/14/24 1022     Visit Number 10   # unchanged due to screen only   PT Start Time 1019    PT Stop Time 1024    PT Time Calculation (min) 5 min    Activity Tolerance Patient tolerated treatment well    Behavior During Therapy [facility] for tasks assessed/performed             Past Medical History:  Diagnosis Date   Family history of breast cancer    Family history of lung cancer    Family history of pancreatic cancer    Family history of prostate cancer    History of cancer chemotherapy    right breast cancer  05-10-2021  to 09-20-2021   History of external beam radiation therapy    right breast 12-01-2021 to 01-11-2022   Irritation of right eye 06/01/2022   Malignant neoplasm of upper-inner quadrant of right breast in female, 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   oncologist--- dr Lee Public;  Stage IIA,  IDC/  DCIS;  completed chemo 09-20-2021 , completed radiation 01-11-2022   Wears contact lenses    Past Surgical History:  Procedure Laterality Date   LAPAROSCOPIC BILATERAL SALPINGO OOPHERECTOMY Bilateral 06/12/2022   Procedure: LAPAROSCOPIC BILATERAL SALPINGO OOPHORECTOMY;  Surgeon: Loa Riling, DO;  Location: [facility];  Service: Gynecology;  Laterality: Bilateral;   MASTECTOMY W/ SENTINEL NODE BIOPSY Right 10/21/2021   Procedure: RIGHT MASTECTOMY WITH RIGHT AXILLARY SENTINEL LYMPH NODE BIOPSY;  Surgeon: Enid Harry, MD;  Location: [facility];  Service: General;  Laterality: Right;   PORT-A-CATH REMOVAL Left 10/21/2021   Procedure: REMOVAL PORT-A-CATH;  Surgeon: Enid Harry, MD;  Location: [facility];  Service: General;  Laterality: Left;    PORTACATH PLACEMENT Left 05/09/2021   Procedure: INSERTION PORT-A-CATH;  Surgeon: Enid Harry, MD;  Location: [facility];  Service: General;  Laterality: Left;   RADIOACTIVE SEED GUIDED AXILLARY SENTINEL LYMPH NODE Right 10/21/2021   Procedure: RADIOACTIVE SEED GUIDED RIGHT AXILLARY LYMPH NODE EXCISION;  Surgeon: Enid Harry, MD;  Location: [facility];  Service: General;  Laterality: Right;   TOTAL MASTECTOMY Left 10/21/2021   Procedure: LEFT TOTAL MASTECTOMY;  Surgeon: Enid Harry, MD;  Location: [facility];  Service: General;  Laterality: Left;   WISDOM TOOTH EXTRACTION     Patient Active Problem List   Diagnosis Date Noted   S/P bilateral mastectomy 10/21/2021   Genetic testing 04/19/2021   Malignant neoplasm of upper-inner quadrant of right breast in female, estrogen receptor positive (HCC) 04/13/2021   Family history of prostate cancer    Family history of pancreatic cancer    Family history of breast cancer    Family history of lung cancer    Active labor 03/15/2016   Postpartum care following vaginal delivery 03/15/2016   SVD (spontaneous vaginal delivery) 01/18/2014   Labor and delivery, indication for care 01/17/2014   Antepartum bleeding, third trimester 01/01/2014   Placenta previa in second trimester 11/28/2013    REFERRING DIAG: right breast cancer at risk for lymphedema  THERAPY DIAG: Aftercare following surgery for neoplasm  PERTINENT HISTORY: Patient was diagnosed on 04/11/2021 with right grade II invasive ductal carcinoma breast cancer. It measures 4.5 cm and is located  in the upper inner quadrant. It is ER positive, weakly PR positive, and HER2 equivocal. Ki67 is 25%. She has 2 abnormal appearing axillary lymph nodes. One was biopsied and found to be positive.   PRECAUTIONS: right UE Lymphedema risk, None  SUBJECTIVE: Pt returns for her 3 month L-Dex screen.   PAIN:  Are you having pain? No  SOZO SCREENING: Patient  was assessed today using the SOZO machine to determine the lymphedema index score. This was compared to her baseline score. It was determined that she is within the recommended range when compared to her baseline and no further action is needed at this time. She will continue SOZO screenings. These are done every 3 months for 2 years post operatively followed by every 6 months for 2 years, and then annually.     L-DEX FLOWSHEETS - 04/14/24 1000       L-DEX LYMPHEDEMA SCREENING   Measurement Type Unilateral    L-DEX MEASUREMENT EXTREMITY Upper Extremity    POSITION  Standing    DOMINANT SIDE Right    At Risk Side Right    BASELINE SCORE (UNILATERAL) 1.6    L-DEX SCORE (UNILATERAL) 5    VALUE CHANGE (UNILAT) 3.4              Denyce Flank, PTA 04/14/2024, 10:23 AM

## 2024-06-13 ENCOUNTER — Other Ambulatory Visit: Payer: Self-pay | Admitting: Hematology and Oncology

## 2024-06-17 ENCOUNTER — Inpatient Hospital Stay: Attending: Adult Health

## 2024-06-17 DIAGNOSIS — C50211 Malignant neoplasm of upper-inner quadrant of right female breast: Secondary | ICD-10-CM | POA: Diagnosis present

## 2024-06-17 DIAGNOSIS — Z1721 Progesterone receptor positive status: Secondary | ICD-10-CM | POA: Insufficient documentation

## 2024-06-17 DIAGNOSIS — Z170 Estrogen receptor positive status [ER+]: Secondary | ICD-10-CM | POA: Insufficient documentation

## 2024-06-17 DIAGNOSIS — Z1732 Human epidermal growth factor receptor 2 negative status: Secondary | ICD-10-CM | POA: Diagnosis not present

## 2024-06-17 DIAGNOSIS — Z79811 Long term (current) use of aromatase inhibitors: Secondary | ICD-10-CM | POA: Diagnosis not present

## 2024-06-17 LAB — CBC WITH DIFFERENTIAL (CANCER CENTER ONLY)
Abs Immature Granulocytes: 0.01 K/uL (ref 0.00–0.07)
Basophils Absolute: 0 K/uL (ref 0.0–0.1)
Basophils Relative: 0 %
Eosinophils Absolute: 0 K/uL (ref 0.0–0.5)
Eosinophils Relative: 1 %
HCT: 34 % — ABNORMAL LOW (ref 36.0–46.0)
Hemoglobin: 11.8 g/dL — ABNORMAL LOW (ref 12.0–15.0)
Immature Granulocytes: 0 %
Lymphocytes Relative: 41 %
Lymphs Abs: 1.6 K/uL (ref 0.7–4.0)
MCH: 34.1 pg — ABNORMAL HIGH (ref 26.0–34.0)
MCHC: 34.7 g/dL (ref 30.0–36.0)
MCV: 98.3 fL (ref 80.0–100.0)
Monocytes Absolute: 0.3 K/uL (ref 0.1–1.0)
Monocytes Relative: 8 %
Neutro Abs: 2 K/uL (ref 1.7–7.7)
Neutrophils Relative %: 50 %
Platelet Count: 190 K/uL (ref 150–400)
RBC: 3.46 MIL/uL — ABNORMAL LOW (ref 3.87–5.11)
RDW: 12.1 % (ref 11.5–15.5)
WBC Count: 3.9 K/uL — ABNORMAL LOW (ref 4.0–10.5)
nRBC: 0 % (ref 0.0–0.2)

## 2024-06-17 LAB — CMP (CANCER CENTER ONLY)
ALT: 12 U/L (ref 0–44)
AST: 18 U/L (ref 15–41)
Albumin: 4.3 g/dL (ref 3.5–5.0)
Alkaline Phosphatase: 48 U/L (ref 38–126)
Anion gap: 5 (ref 5–15)
BUN: 10 mg/dL (ref 6–20)
CO2: 32 mmol/L (ref 22–32)
Calcium: 9.5 mg/dL (ref 8.9–10.3)
Chloride: 102 mmol/L (ref 98–111)
Creatinine: 0.98 mg/dL (ref 0.44–1.00)
GFR, Estimated: >60 mL/min (ref >60)
Glucose, Bld: 87 mg/dL (ref 70–99)
Potassium: 3.7 mmol/L (ref 3.5–5.1)
Sodium: 139 mmol/L (ref 135–145)
Total Bilirubin: 0.3 mg/dL (ref 0.0–1.2)
Total Protein: 7.3 g/dL (ref 6.5–8.1)

## 2024-06-26 ENCOUNTER — Telehealth: Payer: Self-pay | Admitting: Pharmacist

## 2024-06-26 NOTE — Telephone Encounter (Signed)
 [facility]       Telephone: [phone]?Fax: [phone]   Oncology Clinical Pharmacist Practitioner Progress Note  Chelsea Wise is a 43 y.o. female with a diagnosis of breast cancer currently on adjuvant abemaciclib  and letrozole  under the care of Dr. Vinay Gudena.   I connected with Oralia Criger Letts today by telephone and verified that I was speaking with the correct person using two patient identifiers. I discussed the limitations, risks, security and privacy concerns of performing an evaluation and management service by telemedicine and the availability of in-person appointments. The patient/caregiver expressed understanding and agreed to proceed.  Other persons participating in the visit and their role in the encounter: none   Patient's location: home  Provider's location: clinic  Patient called asking about when she will finish abemaciclib . Labs from 06/17/24 reviewed. Will have her see Dr. Odean prior to finishing abemaciclib  on 08/18/24. Discussed with patient that she is not neutropenic and just use precautions to not get sick. She likely does not need to wear a mask unless others are sick around her.  Porter Meliton Staffa participated in the discussion, expressed understanding, and voiced agreement with the above plan. All questions were answered to her satisfaction. The patient was advised to contact the clinic at [phone] with any questions or concerns prior to her return visit.  Clinical pharmacy will continue to support Porter Meliton Whitehair and Dr. Vinay Gudena as needed.  Marillyn Goren A. Lucila, PharmD, BCOP, CPP  Norleen DELENA Lucila, RPH-CPP,  06/26/2024  3:57 PM   **Disclaimer: This note was dictated with voice recognition software. Similar sounding words can inadvertently be transcribed and this note may contain transcription errors which may not have been corrected upon publication of note.**

## 2024-08-01 ENCOUNTER — Other Ambulatory Visit: Payer: Self-pay

## 2024-08-01 DIAGNOSIS — C50211 Malignant neoplasm of upper-inner quadrant of right female breast: Secondary | ICD-10-CM

## 2024-08-05 ENCOUNTER — Inpatient Hospital Stay: Admitting: Hematology and Oncology

## 2024-08-05 ENCOUNTER — Inpatient Hospital Stay: Attending: Adult Health

## 2024-08-05 VITALS — BP 119/77 | HR 69 | Temp 97.7°F | Resp 17 | Wt 143.7 lb

## 2024-08-05 DIAGNOSIS — Z1732 Human epidermal growth factor receptor 2 negative status: Secondary | ICD-10-CM | POA: Diagnosis not present

## 2024-08-05 DIAGNOSIS — C50211 Malignant neoplasm of upper-inner quadrant of right female breast: Secondary | ICD-10-CM | POA: Diagnosis present

## 2024-08-05 DIAGNOSIS — Z1721 Progesterone receptor positive status: Secondary | ICD-10-CM | POA: Diagnosis not present

## 2024-08-05 DIAGNOSIS — Z170 Estrogen receptor positive status [ER+]: Secondary | ICD-10-CM

## 2024-08-05 DIAGNOSIS — R7989 Other specified abnormal findings of blood chemistry: Secondary | ICD-10-CM | POA: Insufficient documentation

## 2024-08-05 DIAGNOSIS — Z9221 Personal history of antineoplastic chemotherapy: Secondary | ICD-10-CM | POA: Diagnosis not present

## 2024-08-05 DIAGNOSIS — Z9013 Acquired absence of bilateral breasts and nipples: Secondary | ICD-10-CM | POA: Insufficient documentation

## 2024-08-05 DIAGNOSIS — Z923 Personal history of irradiation: Secondary | ICD-10-CM | POA: Insufficient documentation

## 2024-08-05 DIAGNOSIS — Z79811 Long term (current) use of aromatase inhibitors: Secondary | ICD-10-CM | POA: Diagnosis not present

## 2024-08-05 DIAGNOSIS — N951 Menopausal and female climacteric states: Secondary | ICD-10-CM | POA: Diagnosis not present

## 2024-08-05 DIAGNOSIS — M2550 Pain in unspecified joint: Secondary | ICD-10-CM | POA: Diagnosis not present

## 2024-08-05 LAB — CBC WITH DIFFERENTIAL (CANCER CENTER ONLY)
Abs Immature Granulocytes: 0.01 K/uL (ref 0.00–0.07)
Basophils Absolute: 0 K/uL (ref 0.0–0.1)
Basophils Relative: 1 %
Eosinophils Absolute: 0 K/uL (ref 0.0–0.5)
Eosinophils Relative: 1 %
HCT: 34 % — ABNORMAL LOW (ref 36.0–46.0)
Hemoglobin: 11.7 g/dL — ABNORMAL LOW (ref 12.0–15.0)
Immature Granulocytes: 0 %
Lymphocytes Relative: 35 %
Lymphs Abs: 1.3 K/uL (ref 0.7–4.0)
MCH: 34.2 pg — ABNORMAL HIGH (ref 26.0–34.0)
MCHC: 34.4 g/dL (ref 30.0–36.0)
MCV: 99.4 fL (ref 80.0–100.0)
Monocytes Absolute: 0.2 K/uL (ref 0.1–1.0)
Monocytes Relative: 6 %
Neutro Abs: 2.2 K/uL (ref 1.7–7.7)
Neutrophils Relative %: 57 %
Platelet Count: 189 K/uL (ref 150–400)
RBC: 3.42 MIL/uL — ABNORMAL LOW (ref 3.87–5.11)
RDW: 12.7 % (ref 11.5–15.5)
WBC Count: 3.8 K/uL — ABNORMAL LOW (ref 4.0–10.5)
nRBC: 0 % (ref 0.0–0.2)

## 2024-08-05 LAB — CMP (CANCER CENTER ONLY)
ALT: 69 U/L — ABNORMAL HIGH (ref 0–44)
AST: 209 U/L (ref 15–41)
Albumin: 4.3 g/dL (ref 3.5–5.0)
Alkaline Phosphatase: 51 U/L (ref 38–126)
Anion gap: 5 (ref 5–15)
BUN: 12 mg/dL (ref 6–20)
CO2: 30 mmol/L (ref 22–32)
Calcium: 9.4 mg/dL (ref 8.9–10.3)
Chloride: 104 mmol/L (ref 98–111)
Creatinine: 0.94 mg/dL (ref 0.44–1.00)
GFR, Estimated: >60 mL/min (ref >60)
Glucose, Bld: 87 mg/dL (ref 70–99)
Potassium: 3.6 mmol/L (ref 3.5–5.1)
Sodium: 139 mmol/L (ref 135–145)
Total Bilirubin: 0.3 mg/dL (ref 0.0–1.2)
Total Protein: 7.3 g/dL (ref 6.5–8.1)

## 2024-08-05 NOTE — Progress Notes (Addendum)
 Patient Care Team: Delana Ted Morrison, DO as PCP - General (Obstetrics and Gynecology) Izell Domino, MD as Consulting Physician (Radiation Oncology) Ebbie Cough, MD as Consulting Physician (General Surgery) Odean Potts, MD as Consulting Physician (Hematology and Oncology) Lucila Norleen LABOR, RPH-CPP as Pharmacist (Hematology and Oncology)  DIAGNOSIS:  Encounter Diagnosis  Name Primary?   Malignant neoplasm of upper-inner quadrant of right breast in female, estrogen receptor positive (HCC) Yes    SUMMARY OF ONCOLOGIC HISTORY: Oncology History  Malignant neoplasm of upper-inner quadrant of right breast in female, estrogen receptor positive (HCC)  04/08/2021 Initial Diagnosis   Palpable right breast mass: Mammogram and US  showed a 4.5cm right breast mass at the 12-1 o'clock position with associated microcalcifications, multiple additional masses from the 9-11 o'clock position in the right breast, calcifications in the subareolar right breast, 1.4cm, and two enlarged right axillary lymph nodes. 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 Two variants of uncertain significance (VUS) were detected - one in the APC gene called p.T2422I (c.7265C>T) and a second in the BAP1 gene called p.P302L (c.905C>T). The report dates are 04/19/2021 and 04/25/2021, respectively.  The BRCAplus panel offered by [employer] and includes sequencing and deletion/duplication analysis for the following 8  genes: ATM, BRCA1, BRCA2, CDH1, CHEK2, PALB2, PTEN, and TP53. The CancerNext-Expanded + RNAinsight gene panel offered by [employer] and includes sequencing and rearrangement analysis for the following 77 genes: AIP, ALK, APC, ATM, AXIN2, BAP1, BARD1, BLM, BMPR1A, BRCA1, BRCA2, BRIP1, CDC73, CDH1, CDK4, CDKN1B, CDKN2A, CHEK2, CTNNA1, DICER1, FANCC, FH, FLCN, GALNT12, KIF1B, LZTR1, MAX, MEN1, MET, MLH1, MSH2, MSH3, MSH6, MUTYH, NBN, NF1, NF2, NTHL1, PALB2, PHOX2B, PMS2, POT1, PRKAR1A, PTCH1, PTEN, RAD51C, RAD51D, RB1, RECQL, RET, SDHA, SDHAF2, SDHB, SDHC, SDHD, SMAD4, SMARCA4, SMARCB1, SMARCE1, STK11, SUFU, TMEM127, TP53, TSC1, TSC2, VHL and XRCC2 (sequencing and deletion/duplication); EGFR, EGLN1, HOXB13, KIT, MITF, PDGFRA, POLD1 and POLE (sequencing only); EPCAM and GREM1 (deletion/duplication only). RNA data is routinely analyzed for use in variant interpretation for all genes.    05/10/2021 - 09/20/2021 Chemotherapy   Patient is on Treatment Plan : BREAST ADJUVANT DOSE DENSE AC q14d / PACLitaxel  q7d     10/21/2021 Surgery   Bilateral mastectomies: Left breast benign.  Right breast showed 6 cm of invasive ductal carcinoma, grade 2, margins negative, 2 out of 7 lymph nodes were positive for macrometastases.  ER positive, PR negative, HER2 negative.   10/21/2021 Cancer Staging   Staging form: Breast, AJCC 8th Edition - Pathologic stage from 10/21/2021: No Stage Recommended (ypT3, pN1a, cM0, G2, ER+, PR-, HER2-) - Signed by Crawford Morna Pickle, NP on 04/03/2022 Stage prefix: Post-therapy Histologic grading system: 3 grade system   11/30/2021 - 01/11/2022 Radiation Therapy   Site Technique Total Dose (Gy) Dose per Fx (Gy) Completed Fx Beam Energies  Chest Wall, Right: CW_R_IMN 3D 50/50 2 25/25 6X, 10X  Chest Wall, Right: CW_R_PAB_SCV 3D 50/50 2 25/25 6X, 10X  Chest Wall, Right: CW_R_Bst Electron 10/10 2 5/5 6E  CHIEF COMPLIANT: Follow-up after completion of Verzinio  HISTORY OF PRESENT  ILLNESS:  History of Present Illness Chelsea Wise is a 43 year old female with breast cancer who presents for follow-up after completing chemotherapy.  She experienced significant gastrointestinal side effects during chemotherapy, including diarrhea and stomach issues, which were managed with Imodium.  She is currently taking letrozole  and a multivitamin. She is concerned about potential side effects of letrozole , particularly regarding bone density and stiffness. She experiences morning stiffness, which improves with movement and hot showers.  She has gained approximately 10 to 11 pounds, attributing this to recent lifestyle changes and possibly hormonal factors. She recently participated in an obstacle course, which was physically challenging.  She has symptoms consistent with menopause, such as excessive sweating, which may be related to hormonal changes.     ALLERGIES:  has no known allergies.  MEDICATIONS:  Current Outpatient Medications  Medication Sig Dispense Refill   acetaminophen  (TYLENOL ) 500 MG tablet Take 500-1,000 mg by mouth every 6 (six) hours as needed for moderate pain. (Patient not taking: Reported on 08/05/2024)     Cholecalciferol (VITAMIN D3) 25 MCG (1000 UT) CAPS Take 1,000 Int'l Units by mouth daily. (Patient not taking: Reported on 08/05/2024)     letrozole  (FEMARA ) 2.5 MG tablet TAKE 1 TABLET BY MOUTH EVERY DAY (Patient not taking: Reported on 08/05/2024) 90 tablet 3   loteprednol (LOTEMAX) 0.5 % ophthalmic suspension 3 drops 3 (three) times daily. (Patient not taking: Reported on 08/05/2024)     Multiple Vitamin (MULTIVITAMIN WITH MINERALS) TABS tablet Take 2 tablets by mouth daily. (Patient not taking: Reported on 08/05/2024)     No current facility-administered medications for this visit.    PHYSICAL EXAMINATION: ECOG PERFORMANCE STATUS: 1 - Symptomatic but completely ambulatory  Vitals:   08/05/24 1150  BP: 119/77  Pulse: 69  Resp: 17  Temp: 97.7 F  (36.5 C)  SpO2: 100%   Filed Weights   08/05/24 1150  Weight: 143 lb 11.2 oz (65.2 kg)    LABORATORY DATA:  I have reviewed the data as listed    Latest Ref Rng & Units 06/17/2024    2:01 PM 03/18/2024   11:21 AM 12/17/2023   12:34 PM  CMP  Glucose 70 - 99 mg/dL 87  82  89   BUN 6 - 20 mg/dL 10  12  12    Creatinine 0.44 - 1.00 mg/dL 9.01  8.96  9.00   Sodium 135 - 145 mmol/L 139  141  138   Potassium 3.5 - 5.1 mmol/L 3.7  3.4  3.9   Chloride 98 - 111 mmol/L 102  106  102   CO2 22 - 32 mmol/L 32  32  32   Calcium  8.9 - 10.3 mg/dL 9.5  9.5  9.8   Total Protein 6.5 - 8.1 g/dL 7.3  7.6  7.5   Total Bilirubin 0.0 - 1.2 mg/dL 0.3  0.3  0.3   Alkaline Phos 38 - 126 U/L 48  49  50   AST 15 - 41 U/L 18  19  17    ALT 0 - 44 U/L 12  12  10      Lab Results  Component Value Date   WBC 3.8 (L) 08/05/2024   HGB 11.7 (L) 08/05/2024   HCT 34.0 (L) 08/05/2024   MCV 99.4 08/05/2024   PLT 189 08/05/2024   NEUTROABS 2.2 08/05/2024    ASSESSMENT & PLAN:  Malignant neoplasm of upper-inner quadrant of right  breast in female, 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:Palpable right breast mass: Mammogram and US  showed a 4.5cm right breast mass at the 12-1 o'clock position with associated microcalcifications, multiple additional masses from the 9-11 o'clock position in the right breast, calcifications in the subareolar right breast, 1.4cm, and two enlarged right axillary lymph nodes. 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  Neoadjuvant chemotherapy with dose dense Adriamycin  and Cytoxan  x4 followed by Taxol  weekly x12 completed 09/14/21 2. Bilateral mastectomies with targeted node dissection 10/21/21 Left: Benign Right: 6 cm  residual IDC 2/7 LN Positive ER: 80%, PR 1%, Her 2 Neg 3.  Adjuvant radiation 12/01/2021-01/11/2022 4.  Oophorectomy 06/12/2022  ---------------------------------------------------------------------------------------------------------------------- Current treatment: Adjuvant letrozole  started 02/01/2022.  Verzinio started 08/19/2022   Letrozole  toxicities: Tolerating letrozole  extremely well without any problems or concerns.   Abemaciclib  toxicities: Alternating diarrhea with constipation Mild leukopenia: Monitoring closely, today's ANC is 1.9 This concludes her 2-year treatment with Verzenio .  After this prescription she can discontinue it.  She will remain on letrozole  therapy  Elevated LFTs: I received the results after she left.  Therefore I called her and left her voicemail to stop Verzinio at this time and to come back in 2 weeks to recheck her labs.  Breast cancer surveillance: 1.  Breast exam 08/05/2024: Benign 2. mammogram: No role of imaging since she had bilateral mastectomies Recommend guardant reveal for MRD monitoring   Return to clinic in 1 year for follow-up ------------------------------------- Assessment and Plan Assessment & Plan Estrogen receptor positive breast cancer, status post adjuvant therapy Completed two years of adjuvant therapy. Currently on letrozole . Labs show well-managed hematologic parameters. New blood test for monitoring recurrence available, covered by insurance, and home-administered. - Continue letrozole  for a few more years. - Offer new blood test for breast cancer monitoring every six months.  Treatment-related menopausal symptoms Symptoms consistent with menopause due to hormonal suppression from cancer treatment. Experiences sweating and weight gain.  Treatment-related arthralgia Experiences morning stiffness, possibly related to letrozole . Stiffness improves with movement and may be exacerbated by cold weather. Bone density monitoring necessary due  to letrozole 's potential impact on bone health. - Recommend hot showers and morning stretching to alleviate stiffness. - Monitor bone density.  Treatment-related diarrhea, resolved - Discontinue Verzenio , Compazine , Zofran , and Imodium.      No orders of the defined types were placed in this encounter.  The patient has a good understanding of the overall plan. she agrees with it. she will call with any problems that may develop before the next visit here. Total time spent: 30 mins including face to face time and time spent for planning, charting and co-ordination of care   Naomi MARLA Chad, MD 08/05/24

## 2024-08-05 NOTE — Assessment & Plan Note (Signed)
 04/08/2021:Palpable right breast mass: Mammogram and US  showed a 4.5cm right breast mass at the 12-1 o'clock position with associated microcalcifications, multiple additional masses from the 9-11 o'clock position in the right breast, calcifications in the subareolar right breast, 1.4cm, and two enlarged right axillary lymph nodes. 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  Neoadjuvant chemotherapy with dose dense Adriamycin  and Cytoxan  x4 followed by Taxol  weekly x12 completed 09/14/21 2. Bilateral mastectomies with targeted node dissection 10/21/21 Left: Benign Right: 6 cm residual IDC 2/7 LN Positive ER: 80%, PR 1%, Her 2 Neg 3.  Adjuvant radiation 12/01/2021-01/11/2022 4.  Oophorectomy 06/12/2022  ---------------------------------------------------------------------------------------------------------------------- Current treatment: Adjuvant letrozole  started 02/01/2022.  Verzinio started 08/19/2022   Letrozole  toxicities: Tolerating letrozole  extremely well without any problems or concerns.   Abemaciclib  toxicities: Alternating diarrhea with constipation Mild leukopenia: Monitoring closely, today's ANC is 1.9 This concludes her 2-year treatment with Verzenio .  After this prescription she can discontinue it.  She will remain on letrozole  therapy   Breast cancer surveillance: 1.  Breast exam 08/05/2024: Benign 2. mammogram: No role of imaging since she had bilateral mastectomies   Return to clinic in 1 year for follow-up

## 2024-08-06 ENCOUNTER — Telehealth: Payer: Self-pay

## 2024-08-06 NOTE — Telephone Encounter (Signed)
 Per md orders entered for [facility] [employer] and all supported documents faxed to [phone]. Faxed confirmation was received.

## 2024-08-12 ENCOUNTER — Ambulatory Visit (HOSPITAL_BASED_OUTPATIENT_CLINIC_OR_DEPARTMENT_OTHER)
Admission: RE | Admit: 2024-08-12 | Discharge: 2024-08-12 | Disposition: A | Discharge status: Home / Self Care | Source: Ambulatory Visit | Attending: Hematology and Oncology

## 2024-08-12 ENCOUNTER — Other Ambulatory Visit: Payer: BC Managed Care – PPO

## 2024-08-12 DIAGNOSIS — C50211 Malignant neoplasm of upper-inner quadrant of right female breast: Secondary | ICD-10-CM | POA: Diagnosis present

## 2024-08-12 DIAGNOSIS — Z170 Estrogen receptor positive status [ER+]: Secondary | ICD-10-CM | POA: Diagnosis present

## 2024-08-15 ENCOUNTER — Encounter: Payer: Self-pay | Admitting: *Deleted

## 2024-08-15 ENCOUNTER — Other Ambulatory Visit: Payer: Self-pay

## 2024-08-15 DIAGNOSIS — D649 Anemia, unspecified: Secondary | ICD-10-CM

## 2024-08-15 DIAGNOSIS — N912 Amenorrhea, unspecified: Secondary | ICD-10-CM | POA: Insufficient documentation

## 2024-08-15 DIAGNOSIS — N72 Inflammatory disease of cervix uteri: Secondary | ICD-10-CM | POA: Insufficient documentation

## 2024-08-15 DIAGNOSIS — R8290 Unspecified abnormal findings in urine: Secondary | ICD-10-CM | POA: Insufficient documentation

## 2024-08-15 DIAGNOSIS — Z3490 Encounter for supervision of normal pregnancy, unspecified, unspecified trimester: Secondary | ICD-10-CM | POA: Insufficient documentation

## 2024-08-15 DIAGNOSIS — K649 Unspecified hemorrhoids: Secondary | ICD-10-CM | POA: Insufficient documentation

## 2024-08-15 DIAGNOSIS — O368190 Decreased fetal movements, unspecified trimester, not applicable or unspecified: Secondary | ICD-10-CM | POA: Insufficient documentation

## 2024-08-15 NOTE — Progress Notes (Signed)
 Received message from [facility] team stating patient was non responsive to mobile phlebotomy team.  Testing will be canceled at this time.  If pt wishes to proceed in the future, orders will be placed.

## 2024-08-18 ENCOUNTER — Inpatient Hospital Stay: Admitting: Hematology and Oncology

## 2024-08-18 ENCOUNTER — Inpatient Hospital Stay

## 2024-08-18 VITALS — BP 115/70 | HR 64 | Temp 97.0°F | Resp 18 | Ht 63.5 in | Wt 144.0 lb

## 2024-08-18 DIAGNOSIS — C50211 Malignant neoplasm of upper-inner quadrant of right female breast: Secondary | ICD-10-CM | POA: Diagnosis not present

## 2024-08-18 DIAGNOSIS — D649 Anemia, unspecified: Secondary | ICD-10-CM

## 2024-08-18 DIAGNOSIS — Z170 Estrogen receptor positive status [ER+]: Secondary | ICD-10-CM

## 2024-08-18 LAB — CBC WITH DIFFERENTIAL (CANCER CENTER ONLY)
Abs Immature Granulocytes: 0.02 K/uL (ref 0.00–0.07)
Basophils Absolute: 0 K/uL (ref 0.0–0.1)
Basophils Relative: 0 %
Eosinophils Absolute: 0 K/uL (ref 0.0–0.5)
Eosinophils Relative: 1 %
HCT: 35.3 % — ABNORMAL LOW (ref 36.0–46.0)
Hemoglobin: 12.1 g/dL (ref 12.0–15.0)
Immature Granulocytes: 1 %
Lymphocytes Relative: 37 %
Lymphs Abs: 1.4 K/uL (ref 0.7–4.0)
MCH: 33.6 pg (ref 26.0–34.0)
MCHC: 34.3 g/dL (ref 30.0–36.0)
MCV: 98.1 fL (ref 80.0–100.0)
Monocytes Absolute: 0.3 K/uL (ref 0.1–1.0)
Monocytes Relative: 9 %
Neutro Abs: 2 K/uL (ref 1.7–7.7)
Neutrophils Relative %: 52 %
Platelet Count: 208 K/uL (ref 150–400)
RBC: 3.6 MIL/uL — ABNORMAL LOW (ref 3.87–5.11)
RDW: 12.2 % (ref 11.5–15.5)
WBC Count: 3.7 K/uL — ABNORMAL LOW (ref 4.0–10.5)
nRBC: 0 % (ref 0.0–0.2)

## 2024-08-18 LAB — CMP (CANCER CENTER ONLY)
ALT: 75 U/L — ABNORMAL HIGH (ref 0–44)
AST: 47 U/L — ABNORMAL HIGH (ref 15–41)
Albumin: 4.5 g/dL (ref 3.5–5.0)
Alkaline Phosphatase: 54 U/L (ref 38–126)
Anion gap: 4 — ABNORMAL LOW (ref 5–15)
BUN: 11 mg/dL (ref 6–20)
CO2: 32 mmol/L (ref 22–32)
Calcium: 9.7 mg/dL (ref 8.9–10.3)
Chloride: 104 mmol/L (ref 98–111)
Creatinine: 0.87 mg/dL (ref 0.44–1.00)
GFR, Estimated: >60 mL/min (ref >60)
Glucose, Bld: 91 mg/dL (ref 70–99)
Potassium: 3.8 mmol/L (ref 3.5–5.1)
Sodium: 140 mmol/L (ref 135–145)
Total Bilirubin: 0.3 mg/dL (ref 0.0–1.2)
Total Protein: 7.5 g/dL (ref 6.5–8.1)

## 2024-08-18 NOTE — Progress Notes (Signed)
 Patient Care Team: Delana Ted Morrison, DO as PCP - General (Obstetrics and Gynecology) Izell Domino, MD as Consulting Physician (Radiation Oncology) Ebbie Cough, MD as Consulting Physician (General Surgery) Odean Potts, MD as Consulting Physician (Hematology and Oncology) Lucila Norleen LABOR, RPH-CPP as Pharmacist (Hematology and Oncology)  DIAGNOSIS:  Encounter Diagnosis  Name Primary?   Malignant neoplasm of upper-inner quadrant of right breast in female, estrogen receptor positive (HCC) Yes    SUMMARY OF ONCOLOGIC HISTORY: Oncology History  Malignant neoplasm of upper-inner quadrant of right breast in female, estrogen receptor positive (HCC)  04/08/2021 Initial Diagnosis   Palpable right breast mass: Mammogram and US  showed a 4.5cm right breast mass at the 12-1 o'clock position with associated microcalcifications, multiple additional masses from the 9-11 o'clock position in the right breast, calcifications in the subareolar right breast, 1.4cm, and two enlarged right axillary lymph nodes. 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 Two variants of uncertain significance (VUS) were detected - one in the APC gene called p.T2422I (c.7265C>T) and a second in the BAP1 gene called p.P302L (c.905C>T). The report dates are 04/19/2021 and 04/25/2021, respectively.  The BRCAplus panel offered by [employer] and includes sequencing and deletion/duplication analysis for the following 8  genes: ATM, BRCA1, BRCA2, CDH1, CHEK2, PALB2, PTEN, and TP53. The CancerNext-Expanded + RNAinsight gene panel offered by [employer] and includes sequencing and rearrangement analysis for the following 77 genes: AIP, ALK, APC, ATM, AXIN2, BAP1, BARD1, BLM, BMPR1A, BRCA1, BRCA2, BRIP1, CDC73, CDH1, CDK4, CDKN1B, CDKN2A, CHEK2, CTNNA1, DICER1, FANCC, FH, FLCN, GALNT12, KIF1B, LZTR1, MAX, MEN1, MET, MLH1, MSH2, MSH3, MSH6, MUTYH, NBN, NF1, NF2, NTHL1, PALB2, PHOX2B, PMS2, POT1, PRKAR1A, PTCH1, PTEN, RAD51C, RAD51D, RB1, RECQL, RET, SDHA, SDHAF2, SDHB, SDHC, SDHD, SMAD4, SMARCA4, SMARCB1, SMARCE1, STK11, SUFU, TMEM127, TP53, TSC1, TSC2, VHL and XRCC2 (sequencing and deletion/duplication); EGFR, EGLN1, HOXB13, KIT, MITF, PDGFRA, POLD1 and POLE (sequencing only); EPCAM and GREM1 (deletion/duplication only). RNA data is routinely analyzed for use in variant interpretation for all genes.    05/10/2021 - 09/20/2021 Chemotherapy   Patient is on Treatment Plan : BREAST ADJUVANT DOSE DENSE AC q14d / PACLitaxel  q7d     10/21/2021 Surgery   Bilateral mastectomies: Left breast benign.  Right breast showed 6 cm of invasive ductal carcinoma, grade 2, margins negative, 2 out of 7 lymph nodes were positive for macrometastases.  ER positive, PR negative, HER2 negative.   10/21/2021 Cancer Staging   Staging form: Breast, AJCC 8th Edition - Pathologic stage from 10/21/2021: No Stage Recommended (ypT3, pN1a, cM0, G2, ER+, PR-, HER2-) - Signed by Crawford Morna Pickle, NP on 04/03/2022 Stage prefix: Post-therapy Histologic grading system: 3 grade system   11/30/2021 - 01/11/2022 Radiation Therapy   Site Technique Total Dose (Gy) Dose per Fx (Gy) Completed Fx Beam Energies  Chest Wall, Right: CW_R_IMN 3D 50/50 2 25/25 6X, 10X  Chest Wall, Right: CW_R_PAB_SCV 3D 50/50 2 25/25 6X, 10X  Chest Wall, Right: CW_R_Bst Electron 10/10 2 5/5 6E  CHIEF COMPLIANT:   HISTORY OF PRESENT ILLNESS: Discussed the use of AI  scribe software for clinical note transcription with the patient, who gave verbal consent to proceed.  History of Present Illness Chelsea Wise is a 43 year old female with estrogen receptor-positive breast cancer who presents for follow-up regarding liver enzyme levels and medication management.  She experiences unexplained bruising and increased skin sensitivity, potentially related to her treatment. She is on letrozole  for her breast cancer, with previous use of Verzenio  now discontinued. Concerns about letrozole 's impact on her liver arise due to liver enzyme abnormalities. Recent labs show mixed liver enzyme levels, with one enzyme significantly improved and another slightly elevated. Her CBC indicates normal hemoglobin, a stable white blood cell count of 3.7, and a normal platelet count.     ALLERGIES:  has no known allergies.  MEDICATIONS:  Current Outpatient Medications  Medication Sig Dispense Refill   acetaminophen  (TYLENOL ) 500 MG tablet Take 500-1,000 mg by mouth every 6 (six) hours as needed for moderate pain.     Cholecalciferol (VITAMIN D3) 25 MCG (1000 UT) CAPS Take 1,000 Int'l Units by mouth daily.     letrozole  (FEMARA ) 2.5 MG tablet TAKE 1 TABLET BY MOUTH EVERY DAY 90 tablet 3   loteprednol (LOTEMAX) 0.5 % ophthalmic suspension 3 drops 3 (three) times daily.     Multiple Vitamin (MULTIVITAMIN WITH MINERALS) TABS tablet Take 2 tablets by mouth daily.     No current facility-administered medications for this visit.    PHYSICAL EXAMINATION: ECOG PERFORMANCE STATUS: 1 - Symptomatic but completely ambulatory  Vitals:   08/18/24 1121  BP: 115/70  Pulse: 64  Resp: 18  Temp: (!) 97 F (36.1 C)  SpO2: 98%   Filed Weights   08/18/24 1121  Weight: 144 lb (65.3 kg)    Physical Exam   (exam performed in the presence of a chaperone)  LABORATORY DATA:  I have reviewed the data as listed    Latest Ref Rng & Units 08/18/2024   10:57 AM 08/05/2024   11:23 AM  06/17/2024    2:01 PM  CMP  Glucose 70 - 99 mg/dL 91  87  87   BUN 6 - 20 mg/dL 11  12  10    Creatinine 0.44 - 1.00 mg/dL 9.12  9.05  9.01   Sodium 135 - 145 mmol/L 140  139  139   Potassium 3.5 - 5.1 mmol/L 3.8  3.6  3.7   Chloride 98 - 111 mmol/L 104  104  102   CO2 22 - 32 mmol/L 32  30  32   Calcium  8.9 - 10.3 mg/dL 9.7  9.4  9.5   Total Protein 6.5 - 8.1 g/dL 7.5  7.3  7.3   Total Bilirubin 0.0 - 1.2 mg/dL 0.3  0.3  0.3   Alkaline Phos 38 - 126 U/L 54  51  48   AST 15 - 41 U/L 47  209  18   ALT 0 - 44 U/L 75  69  12     Lab Results  Component Value Date   WBC 3.7 (L) 08/18/2024   HGB 12.1 08/18/2024   HCT 35.3 (L) 08/18/2024   MCV 98.1 08/18/2024   PLT 208 08/18/2024   NEUTROABS 2.0 08/18/2024    ASSESSMENT & PLAN:  Malignant neoplasm of upper-inner quadrant of right breast in female, 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:Palpable right breast mass: Mammogram and US  showed a 4.5cm right breast mass at the 12-1  o'clock position with associated microcalcifications, multiple additional masses from the 9-11 o'clock position in the right breast, calcifications in the subareolar right breast, 1.4cm, and two enlarged right axillary lymph nodes. 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  Neoadjuvant chemotherapy with dose dense Adriamycin  and Cytoxan  x4 followed by Taxol  weekly x12 completed 09/14/21 2. Bilateral mastectomies with targeted node dissection 10/21/21 Left: Benign Right: 6 cm residual IDC 2/7 LN Positive ER: 80%, PR 1%, Her 2 Neg 3.  Adjuvant radiation 12/01/2021-01/11/2022 4.  Oophorectomy 06/12/2022   ---------------------------------------------------------------------------------------------------------------------- Current treatment: Adjuvant letrozole  started 02/01/2022.  Verzinio started 08/19/2022   Letrozole  toxicities: Tolerating letrozole  extremely well without any problems or concerns.   Abemaciclib  toxicities: Alternating diarrhea with constipation Mild leukopenia: Monitoring closely, today's ANC is 2 Having completed 2 years of Verzenio  we discontinued it.  Interestingly on the last check, her LFTs had gone up.   Elevated LFTs: AST is significantly improved, ALT is still elevated. Therefore we can conclude that the cause of the elevated LFTs was Verzinio. Recheck blood work in 1 month.   Breast cancer surveillance: 1.  Breast exam 08/05/2024: Benign 2. mammogram: No role of imaging since she had bilateral mastectomies Recommend [employer] reveal for MRD monitoring: Patient did not respond to the phone calls from [employer]  Lab in 1 month and follow-up     ------------------------------------- Assessment and Plan Assessment & Plan Estrogen receptor positive breast cancer, status post oophorectomy, on adjuvant endocrine therapy Currently on letrozole . Bruising likely due to Verzenio , as platelet count is normal. Liver enzymes monitored for letrozole  impact. Alternatives to letrozole  are equally effective. - Continue letrozole .  Drug-induced liver enzyme elevation, improving after discontinuation of Verzenio , monitoring while on letrozole  Liver enzyme elevation improved post-Verzenio  discontinuation. Mixed current levels suggest Verzenio  as cause. Full recovery expected without dietary changes or interventions. - Recheck liver enzymes in one month. - Continue monitoring liver function on letrozole .      Orders Placed This Encounter  Procedures   CBC with Differential (Cancer Center Only)    Standing Status:   Future    Expiration Date:   08/18/2025   CMP (Cancer Center  only)    Standing Status:   Future    Expiration Date:   08/18/2025   The patient has a good understanding of the overall plan. she agrees with it. she will call with any problems that may develop before the next visit here. Total time spent: 30 mins including face to face time and time spent for planning, charting and co-ordination of care   Naomi MARLA Chad, MD 08/18/24

## 2024-08-18 NOTE — Assessment & Plan Note (Signed)
 04/08/2021:Palpable right breast mass: Mammogram and US  showed a 4.5cm right breast mass at the 12-1 o'clock position with associated microcalcifications, multiple additional masses from the 9-11 o'clock position in the right breast, calcifications in the subareolar right breast, 1.4cm, and two enlarged right axillary lymph nodes. 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  Neoadjuvant chemotherapy with dose dense Adriamycin  and Cytoxan  x4 followed by Taxol  weekly x12 completed 09/14/21 2. Bilateral mastectomies with targeted node dissection 10/21/21 Left: Benign Right: 6 cm residual IDC 2/7 LN Positive ER: 80%, PR 1%, Her 2 Neg 3.  Adjuvant radiation 12/01/2021-01/11/2022 4.  Oophorectomy 06/12/2022  ---------------------------------------------------------------------------------------------------------------------- Current treatment: Adjuvant letrozole  started 02/01/2022.  Verzinio started 08/19/2022   Letrozole  toxicities: Tolerating letrozole  extremely well without any problems or concerns.   Abemaciclib  toxicities: Alternating diarrhea with constipation Mild leukopenia: Monitoring closely, today's ANC is 1.9 This concludes her 2-year treatment with Verzenio .  After this prescription she can discontinue it.  She will remain on letrozole  therapy   Elevated LFTs:    Breast cancer surveillance: 1.  Breast exam 08/05/2024: Benign 2. mammogram: No role of imaging since she had bilateral mastectomies Recommend [employer] reveal for MRD monitoring: Patient did not respond to the phone calls from [employer]

## 2024-09-15 ENCOUNTER — Inpatient Hospital Stay: Attending: Adult Health

## 2024-09-15 ENCOUNTER — Inpatient Hospital Stay: Admitting: Hematology and Oncology

## 2024-09-15 VITALS — BP 121/69 | HR 67 | Temp 98.3°F | Resp 16 | Wt 144.8 lb

## 2024-09-15 DIAGNOSIS — Z170 Estrogen receptor positive status [ER+]: Secondary | ICD-10-CM

## 2024-09-15 DIAGNOSIS — C50211 Malignant neoplasm of upper-inner quadrant of right female breast: Secondary | ICD-10-CM

## 2024-09-15 DIAGNOSIS — Z9221 Personal history of antineoplastic chemotherapy: Secondary | ICD-10-CM | POA: Diagnosis not present

## 2024-09-15 DIAGNOSIS — Z90722 Acquired absence of ovaries, bilateral: Secondary | ICD-10-CM | POA: Insufficient documentation

## 2024-09-15 DIAGNOSIS — Z79811 Long term (current) use of aromatase inhibitors: Secondary | ICD-10-CM | POA: Diagnosis not present

## 2024-09-15 DIAGNOSIS — Z9013 Acquired absence of bilateral breasts and nipples: Secondary | ICD-10-CM | POA: Insufficient documentation

## 2024-09-15 DIAGNOSIS — Z923 Personal history of irradiation: Secondary | ICD-10-CM | POA: Insufficient documentation

## 2024-09-15 DIAGNOSIS — R7989 Other specified abnormal findings of blood chemistry: Secondary | ICD-10-CM | POA: Diagnosis not present

## 2024-09-15 DIAGNOSIS — Z1721 Progesterone receptor positive status: Secondary | ICD-10-CM | POA: Insufficient documentation

## 2024-09-15 DIAGNOSIS — Z1732 Human epidermal growth factor receptor 2 negative status: Secondary | ICD-10-CM | POA: Diagnosis not present

## 2024-09-15 LAB — CBC WITH DIFFERENTIAL (CANCER CENTER ONLY)
Abs Immature Granulocytes: 0.01 K/uL (ref 0.00–0.07)
Basophils Absolute: 0 K/uL (ref 0.0–0.1)
Basophils Relative: 0 %
Eosinophils Absolute: 0.1 K/uL (ref 0.0–0.5)
Eosinophils Relative: 1 %
HCT: 34.6 % — ABNORMAL LOW (ref 36.0–46.0)
Hemoglobin: 12 g/dL (ref 12.0–15.0)
Immature Granulocytes: 0 %
Lymphocytes Relative: 32 %
Lymphs Abs: 1.6 K/uL (ref 0.7–4.0)
MCH: 32.9 pg (ref 26.0–34.0)
MCHC: 34.7 g/dL (ref 30.0–36.0)
MCV: 94.8 fL (ref 80.0–100.0)
Monocytes Absolute: 0.3 K/uL (ref 0.1–1.0)
Monocytes Relative: 6 %
Neutro Abs: 3.1 K/uL (ref 1.7–7.7)
Neutrophils Relative %: 61 %
Platelet Count: 189 K/uL (ref 150–400)
RBC: 3.65 MIL/uL — ABNORMAL LOW (ref 3.87–5.11)
RDW: 11.7 % (ref 11.5–15.5)
WBC Count: 5 K/uL (ref 4.0–10.5)
nRBC: 0 % (ref 0.0–0.2)

## 2024-09-15 LAB — CMP (CANCER CENTER ONLY)
ALT: 32 U/L (ref 0–44)
AST: 29 U/L (ref 15–41)
Albumin: 4.4 g/dL (ref 3.5–5.0)
Alkaline Phosphatase: 58 U/L (ref 38–126)
Anion gap: 4 — ABNORMAL LOW (ref 5–15)
BUN: 13 mg/dL (ref 6–20)
CO2: 32 mmol/L (ref 22–32)
Calcium: 9.9 mg/dL (ref 8.9–10.3)
Chloride: 104 mmol/L (ref 98–111)
Creatinine: 0.86 mg/dL (ref 0.44–1.00)
GFR, Estimated: >60 mL/min (ref >60)
Glucose, Bld: 108 mg/dL — ABNORMAL HIGH (ref 70–99)
Potassium: 3.8 mmol/L (ref 3.5–5.1)
Sodium: 140 mmol/L (ref 135–145)
Total Bilirubin: 0.4 mg/dL (ref 0.0–1.2)
Total Protein: 7.1 g/dL (ref 6.5–8.1)

## 2024-09-15 NOTE — Assessment & Plan Note (Signed)
 04/08/2021:Palpable right breast mass: Mammogram and US  showed a 4.5cm right breast mass at the 12-1 o'clock position with associated microcalcifications, multiple additional masses from the 9-11 o'clock position in the right breast, calcifications in the subareolar right breast, 1.4cm, and two enlarged right axillary lymph nodes. 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  Neoadjuvant chemotherapy with dose dense Adriamycin  and Cytoxan  x4 followed by Taxol  weekly x12 completed 09/14/21 2. Bilateral mastectomies with targeted node dissection 10/21/21 Left: Benign Right: 6 cm residual IDC 2/7 LN Positive ER: 80%, PR 1%, Her 2 Neg 3.  Adjuvant radiation 12/01/2021-01/11/2022 4.  Oophorectomy 06/12/2022  ---------------------------------------------------------------------------------------------------------------------- Current treatment: Adjuvant letrozole  started 02/01/2022.  Verzinio started 08/19/2022   Letrozole  toxicities: Tolerating letrozole  extremely well without any problems or concerns.   Abemaciclib  toxicities: Alternating diarrhea with constipation Having completed 2 years of Verzenio  we discontinued it.  Interestingly on the last check, her LFTs had gone up.   Elevated LFTs: AST is significantly improved, ALT is still elevated. Therefore we can conclude that the cause of the elevated LFTs was Verzinio.    Breast cancer surveillance: 1.  Breast exam 09/15/2024: Benign 2. mammogram: No role of imaging since she had bilateral mastectomies Recommend guardant reveal for MRD monitoring: Patient did not respond to the phone calls from [employer] Bone density 08/13/2024: T-score -0.1: Normal   Lab in 6 months for labs and  follow-up

## 2024-09-15 NOTE — Progress Notes (Signed)
 Patient Care Team: Delana Ted Morrison, DO as PCP - General (Obstetrics and Gynecology) Izell Domino, MD as Consulting Physician (Radiation Oncology) Ebbie Cough, MD as Consulting Physician (General Surgery) Odean Potts, MD as Consulting Physician (Hematology and Oncology) Lucila Norleen LABOR, RPH-CPP as Pharmacist (Hematology and Oncology)  DIAGNOSIS:  Encounter Diagnosis  Name Primary?   Malignant neoplasm of upper-inner quadrant of right breast in female, estrogen receptor positive (HCC) Yes    SUMMARY OF ONCOLOGIC HISTORY: Oncology History  Malignant neoplasm of upper-inner quadrant of right breast in female, estrogen receptor positive (HCC)  04/08/2021 Initial Diagnosis   Palpable right breast mass: Mammogram and US  showed a 4.5cm right breast mass at the 12-1 o'clock position with associated microcalcifications, multiple additional masses from the 9-11 o'clock position in the right breast, calcifications in the subareolar right breast, 1.4cm, and two enlarged right axillary lymph nodes. 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 Two variants of uncertain significance (VUS) were detected - one in the APC gene called p.T2422I (c.7265C>T) and a second in the BAP1 gene called p.P302L (c.905C>T). The report dates are 04/19/2021 and 04/25/2021, respectively.  The BRCAplus panel offered by [employer] and includes sequencing and deletion/duplication analysis for the following 8  genes: ATM, BRCA1, BRCA2, CDH1, CHEK2, PALB2, PTEN, and TP53. The CancerNext-Expanded + RNAinsight gene panel offered by [employer] and includes sequencing and rearrangement analysis for the following 77 genes: AIP, ALK, APC, ATM, AXIN2, BAP1, BARD1, BLM, BMPR1A, BRCA1, BRCA2, BRIP1, CDC73, CDH1, CDK4, CDKN1B, CDKN2A, CHEK2, CTNNA1, DICER1, FANCC, FH, FLCN, GALNT12, KIF1B, LZTR1, MAX, MEN1, MET, MLH1, MSH2, MSH3, MSH6, MUTYH, NBN, NF1, NF2, NTHL1, PALB2, PHOX2B, PMS2, POT1, PRKAR1A, PTCH1, PTEN, RAD51C, RAD51D, RB1, RECQL, RET, SDHA, SDHAF2, SDHB, SDHC, SDHD, SMAD4, SMARCA4, SMARCB1, SMARCE1, STK11, SUFU, TMEM127, TP53, TSC1, TSC2, VHL and XRCC2 (sequencing and deletion/duplication); EGFR, EGLN1, HOXB13, KIT, MITF, PDGFRA, POLD1 and POLE (sequencing only); EPCAM and GREM1 (deletion/duplication only). RNA data is routinely analyzed for use in variant interpretation for all genes.    05/10/2021 - 09/20/2021 Chemotherapy   Patient is on Treatment Plan : BREAST ADJUVANT DOSE DENSE AC q14d / PACLitaxel  q7d     10/21/2021 Surgery   Bilateral mastectomies: Left breast benign.  Right breast showed 6 cm of invasive ductal carcinoma, grade 2, margins negative, 2 out of 7 lymph nodes were positive for macrometastases.  ER positive, PR negative, HER2 negative.   10/21/2021 Cancer Staging   Staging form: Breast, AJCC 8th Edition - Pathologic stage from 10/21/2021: No Stage Recommended (ypT3, pN1a, cM0, G2, ER+, PR-, HER2-) - Signed by Crawford Morna Pickle, NP on 04/03/2022 Stage prefix: Post-therapy Histologic grading system: 3 grade system   11/30/2021 - 01/11/2022 Radiation Therapy   Site Technique Total Dose (Gy) Dose per Fx (Gy) Completed Fx Beam Energies  Chest Wall, Right: CW_R_IMN 3D 50/50 2 25/25 6X, 10X  Chest Wall, Right: CW_R_PAB_SCV 3D 50/50 2 25/25 6X, 10X  Chest Wall, Right: CW_R_Bst Electron 10/10 2 5/5 6E  CHIEF COMPLIANT:   HISTORY OF PRESENT ILLNESS: Follow-up to review the  results of blood work  History of Present Illness Chelsea Wise is a 43 year old female who presents for follow-up of liver function tests and blood work.  Her AST was previously elevated to 200 unexpectedly after discontinuing certain medications. Over the past two months, her liver function has shown improvement.     ALLERGIES:  has no known allergies.  MEDICATIONS:  Current Outpatient Medications  Medication Sig Dispense Refill   acetaminophen  (TYLENOL ) 500 MG tablet Take 500-1,000 mg by mouth every 6 (six) hours as needed for moderate pain.     Cholecalciferol (VITAMIN D3) 25 MCG (1000 UT) CAPS Take 1,000 Int'l Units by mouth daily.     letrozole  (FEMARA ) 2.5 MG tablet TAKE 1 TABLET BY MOUTH EVERY DAY 90 tablet 3   loteprednol (LOTEMAX) 0.5 % ophthalmic suspension 3 drops 3 (three) times daily.     Multiple Vitamin (MULTIVITAMIN WITH MINERALS) TABS tablet Take 2 tablets by mouth daily.     No current facility-administered medications for this visit.    PHYSICAL EXAMINATION: ECOG PERFORMANCE STATUS: 1 - Symptomatic but completely ambulatory  Vitals:   09/15/24 1351  BP: 121/69  Pulse: 67  Resp: 16  Temp: 98.3 F (36.8 C)  SpO2: 97%   Filed Weights   09/15/24 1351  Weight: 144 lb 12.8 oz (65.7 kg)    Physical Exam   (exam performed in the presence of a chaperone)  LABORATORY DATA:  I have reviewed the data as listed    Latest Ref Rng & Units 09/15/2024    1:40 PM 08/18/2024   10:57 AM 08/05/2024   11:23 AM  CMP  Glucose 70 - 99 mg/dL 891  91  87   BUN 6 - 20 mg/dL 13  11  12    Creatinine 0.44 - 1.00 mg/dL 9.13  9.12  9.05   Sodium 135 - 145 mmol/L 140  140  139   Potassium 3.5 - 5.1 mmol/L 3.8  3.8  3.6   Chloride 98 - 111 mmol/L 104  104  104   CO2 22 - 32 mmol/L 32  32  30   Calcium  8.9 - 10.3 mg/dL 9.9  9.7  9.4   Total Protein 6.5 - 8.1 g/dL 7.1  7.5  7.3   Total Bilirubin 0.0 - 1.2 mg/dL 0.4  0.3  0.3   Alkaline Phos 38 - 126 U/L 58  54  51    AST 15 - 41 U/L 29  47  209   ALT 0 - 44 U/L 32  75  69     Lab Results  Component Value Date   WBC 5.0 09/15/2024   HGB 12.0 09/15/2024   HCT 34.6 (L) 09/15/2024   MCV 94.8 09/15/2024   PLT 189 09/15/2024   NEUTROABS 3.1 09/15/2024    ASSESSMENT & PLAN:  Malignant neoplasm of upper-inner quadrant of right breast in female, 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:Palpable right breast mass: Mammogram and US  showed a 4.5cm right breast mass at the 12-1 o'clock position with associated microcalcifications, multiple additional masses from the 9-11 o'clock position in the right breast, calcifications in the subareolar right breast, 1.4cm, and two enlarged right axillary lymph nodes. Biopsy showed invasive and in situ ductal carcinoma in the breast and axilla, grade 2 ER 80%, PR 1%, Ki-67 25%, HER2 equivalent by IHC, FISH negative   Breast MRI 04/15/2021: 5.7 cm lobulated mass right breast  with enlarged right axillary lymph nodes (2 lymph nodes) MammaPrint: High risk   Treatment plan: 1.  Neoadjuvant chemotherapy with dose dense Adriamycin  and Cytoxan  x4 followed by Taxol  weekly x12 completed 09/14/21 2. Bilateral mastectomies with targeted node dissection 10/21/21 Left: Benign Right: 6 cm residual IDC 2/7 LN Positive ER: 80%, PR 1%, Her 2 Neg 3.  Adjuvant radiation 12/01/2021-01/11/2022 4.  Oophorectomy 06/12/2022  ---------------------------------------------------------------------------------------------------------------------- Current treatment: Adjuvant letrozole  started 02/01/2022.  Verzinio started 08/19/2022   Letrozole  toxicities: Tolerating letrozole  extremely well without any problems or concerns.   Abemaciclib  toxicities: Alternating diarrhea with constipation Having completed 2 years of Verzenio  we discontinued it.  Interestingly on the last check, her LFTs had gone up.   Elevated LFTs: AST is significantly improved, ALT is still elevated. Therefore we can conclude that  the cause of the elevated LFTs was Verzinio.    Breast cancer surveillance: 1.  Breast exam 09/15/2024: Benign 2. mammogram: No role of imaging since she had bilateral mastectomies Recommend [employer] reveal for MRD monitoring: Patient did not respond to the phone calls from [employer] Bone density 08/13/2024: T-score -0.1: Normal   Lab in 6 months for labs and follow-up ------------------------------------- Assessment and Plan Assessment & Plan Malignant neoplasm of upper-inner quadrant of right breast, female Estrogen receptor-positive breast cancer. - Order circulating tumor DNA test every six months to monitor for breast cancer recurrence.      No orders of the defined types were placed in this encounter.  The patient has a good understanding of the overall plan. she agrees with it. she will call with any problems that may develop before the next visit here.  I personally spent a total of 30 minutes in the care of the patient today including preparing to see the patient, getting/reviewing separately obtained history, performing a medically appropriate exam/evaluation, counseling and educating, placing orders, referring and communicating with other health care professionals, documenting clinical information in the EHR, independently interpreting results, communicating results, and coordinating care.   Viinay K Stewart Sasaki, MD 09/15/24

## 2024-10-13 ENCOUNTER — Ambulatory Visit: Payer: Self-pay | Attending: General Surgery

## 2024-10-13 VITALS — Wt 141.5 lb

## 2024-10-13 DIAGNOSIS — Z483 Aftercare following surgery for neoplasm: Secondary | ICD-10-CM | POA: Insufficient documentation

## 2024-10-13 NOTE — Therapy (Signed)
 OUTPATIENT PHYSICAL THERAPY SOZO SCREENING NOTE   Patient Name: Chelsea Wise MRN: [id number] DOB:1981-02-11, 43 y.o., female Today's Date: 10/13/2024  PCP: Delana Ted Morrison, DO REFERRING PROVIDER: Ebbie Cough, MD   PT End of Session - 10/13/24 1651     Visit Number 10   # unchanged due to screen only   PT Start Time 1649    PT Stop Time 1653    PT Time Calculation (min) 4 min    Activity Tolerance Patient tolerated treatment well    Behavior During Therapy [facility] for tasks assessed/performed          Past Medical History:  Diagnosis Date   Family history of breast cancer    Family history of lung cancer    Family history of pancreatic cancer    Family history of prostate cancer    History of cancer chemotherapy    right breast cancer  05-10-2021  to 09-20-2021   History of external beam radiation therapy    right breast 12-01-2021 to 01-11-2022   Irritation of right eye 06/01/2022   Malignant neoplasm of upper-inner quadrant of right breast in female, 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   oncologist--- dr odean;  Stage IIA,  IDC/  DCIS;  completed chemo 09-20-2021 , completed radiation 01-11-2022   Wears contact lenses    Past Surgical History:  Procedure Laterality Date   LAPAROSCOPIC BILATERAL SALPINGO OOPHERECTOMY Bilateral 06/12/2022   Procedure: LAPAROSCOPIC BILATERAL SALPINGO OOPHORECTOMY;  Surgeon: Delana Ted Morrison, DO;  Location: [facility];  Service: Gynecology;  Laterality: Bilateral;   MASTECTOMY W/ SENTINEL NODE BIOPSY Right 10/21/2021   Procedure: RIGHT MASTECTOMY WITH RIGHT AXILLARY SENTINEL LYMPH NODE BIOPSY;  Surgeon: Ebbie Cough, MD;  Location: [facility];  Service: General;  Laterality: Right;   PORT-A-CATH REMOVAL Left 10/21/2021   Procedure: REMOVAL PORT-A-CATH;  Surgeon: Ebbie Cough, MD;  Location: [facility];  Service: General;  Laterality: Left;   PORTACATH  PLACEMENT Left 05/09/2021   Procedure: INSERTION PORT-A-CATH;  Surgeon: Ebbie Cough, MD;  Location: [facility];  Service: General;  Laterality: Left;   RADIOACTIVE SEED GUIDED AXILLARY SENTINEL LYMPH NODE Right 10/21/2021   Procedure: RADIOACTIVE SEED GUIDED RIGHT AXILLARY LYMPH NODE EXCISION;  Surgeon: Ebbie Cough, MD;  Location: [facility];  Service: General;  Laterality: Right;   TOTAL MASTECTOMY Left 10/21/2021   Procedure: LEFT TOTAL MASTECTOMY;  Surgeon: Ebbie Cough, MD;  Location:  SURGERY CENTER;  Service: General;  Laterality: Left;   WISDOM TOOTH EXTRACTION     Patient Active Problem List   Diagnosis Date Noted   Pregnancy 08/15/2024   Postpartum state 08/15/2024   Inflammation of cervix 08/15/2024   Hemorrhoids 08/15/2024   Decreased fetal movement during pregnancy 08/15/2024   Amenorrhea 08/15/2024   Abnormal urinalysis 08/15/2024   S/P bilateral mastectomy 10/21/2021   Genetic testing 04/19/2021   Malignant neoplasm of upper-inner quadrant of right breast in female, estrogen receptor positive (HCC) 04/13/2021   Family history of prostate cancer    Family history of pancreatic cancer    Family history of breast cancer    Family history of lung cancer    Active labor 03/15/2016   Postpartum care following vaginal delivery 03/15/2016   SVD (spontaneous vaginal delivery) 01/18/2014   Labor and delivery, indication for care 01/17/2014   Third trimester bleeding 01/01/2014   Placenta previa in second trimester 11/28/2013    REFERRING DIAG: right breast cancer at risk for lymphedema  THERAPY DIAG: Aftercare following surgery for neoplasm  PERTINENT HISTORY: Patient was diagnosed on 04/11/2021 with right grade II invasive ductal carcinoma breast cancer. It measures 4.5 cm and is located in the upper inner quadrant. It is ER positive, weakly PR positive, and HER2 equivocal. Ki67 is 25%. She has 2 abnormal appearing axillary lymph nodes.  One was biopsied and found to be positive. Rt mastectomy with SLNB 10/21/2021.   PRECAUTIONS: right UE Lymphedema risk, None  SUBJECTIVE: Pt returns for her 6 month L-Dex screen.   PAIN:  Are you having pain? No  SOZO SCREENING: Patient was assessed today using the SOZO machine to determine the lymphedema index score. This was compared to her baseline score. It was determined that she is within the recommended range when compared to her baseline and no further action is needed at this time. She will continue SOZO screenings. These are done every 3 months for 2 years post operatively followed by every 6 months for 2 years, and then annually.     L-DEX FLOWSHEETS - 10/13/24 1600       L-DEX LYMPHEDEMA SCREENING   Measurement Type Unilateral    L-DEX MEASUREMENT EXTREMITY Upper Extremity    POSITION  Standing    DOMINANT SIDE Right    At Risk Side Right    BASELINE SCORE (UNILATERAL) 1.6    L-DEX SCORE (UNILATERAL) 5.5    VALUE CHANGE (UNILAT) 3.9         P: Cont every 6 month L-Dex screens until 10/2025 then can transition to annual.   Aden Berwyn Caldron, PTA 10/13/2024, 4:52 PM

## 2024-10-24 ENCOUNTER — Encounter: Payer: Self-pay | Admitting: *Deleted

## 2024-10-24 NOTE — Progress Notes (Signed)
 Received message from [facility] team stating patient was non responsive to mobile phlebotomy team.  Testing will be canceled at this time.  If pt wishes to proceed in the future, orders will be placed.

## 2025-01-06 ENCOUNTER — Telehealth: Payer: Self-pay | Admitting: Pharmacist

## 2025-01-06 NOTE — Telephone Encounter (Signed)
 [facility]        Telephone: [phone]?Fax: [phone]   Oncology Clinical Pharmacist Practitioner Progress Note   Chelsea Wise is a 44 y.o. female with a diagnosis of breast cancer currently on letrozole  under the care of Dr. Vinay Gudena.   I connected with Devory Mckinzie Defalco today by telephone and verified that I was speaking with the correct person using two patient identifiers. I discussed the limitations, risks, security and privacy concerns of performing an evaluation and management service by telemedicine and the availability of in-person appointments. The patient/caregiver expressed understanding and agreed to proceed.  Other persons participating in the visit and their role in the encounter: none   Patients location: home  Providers location: clinic  Ms. Gregory contacted clinical pharmacy and her phone call was returned. She has been experience a cough since last Thursday with some congestion. She is afebrile. Her daughter had something similar a week or so ago.  She did stop taking letrozole  last Thursday and we discussed restarting this since OTC recommendations below should not interact.  We recommended she could try dextromethorphan and guaifenesin for cough and contact Dr. Gara clinic back in one week if symptoms have not improved or they get worse. She could also consider making an appt with her PCP if needed.  She has also been having a headache since last Saturday but it is improved by taking OTC cetirizine. We discussed PRN ibuprofen  or naproxen  may also help.  She next sees Dr. Gudena 08/06/25 with labs prior. Will forward our note from today.  Porter Meliton Spear participated in the discussion, expressed understanding, and voiced agreement with the above plan. All questions were answered to their satisfaction. The patient was advised to contact the clinic at [phone] with any questions or concerns prior to their return  visit.  Clinical pharmacy will continue to support Porter Meliton Stockert and Dr. Vinay Gudena as needed.  Yuvaan Olander A. Lucila, PharmD, BCOP, CPP  Norleen DELENA Lucila, RPH-CPP,  01/06/2025  12:00 PM   **Disclaimer: This note was dictated with voice recognition software. Similar sounding words can inadvertently be transcribed and this note may contain transcription errors which may not have been corrected upon publication of note.**

## 2025-04-13 ENCOUNTER — Ambulatory Visit: Attending: General Surgery

## 2025-08-06 ENCOUNTER — Ambulatory Visit: Admitting: Hematology and Oncology

## 2025-08-06 ENCOUNTER — Inpatient Hospital Stay
